# Patient Record
Sex: Male | Born: 1950
Health system: Southern US, Community
[De-identification: ages and names within clinical notes are randomized; demographics above are authoritative.]

## PROBLEM LIST (undated history)

## (undated) ENCOUNTER — Emergency Department (HOSPITAL_COMMUNITY): Payer: Medicare Other | Source: Home / Self Care

## (undated) DIAGNOSIS — F3289 Other specified depressive episodes: Secondary | ICD-10-CM

## (undated) DIAGNOSIS — Z833 Family history of diabetes mellitus: Secondary | ICD-10-CM

## (undated) DIAGNOSIS — S139XXA Sprain of joints and ligaments of unspecified parts of neck, initial encounter: Secondary | ICD-10-CM

## (undated) DIAGNOSIS — H6990 Unspecified Eustachian tube disorder, unspecified ear: Secondary | ICD-10-CM

## (undated) DIAGNOSIS — Z860100 Personal history of colon polyps, unspecified: Secondary | ICD-10-CM

## (undated) DIAGNOSIS — I129 Hypertensive chronic kidney disease with stage 1 through stage 4 chronic kidney disease, or unspecified chronic kidney disease: Secondary | ICD-10-CM

## (undated) DIAGNOSIS — F17200 Nicotine dependence, unspecified, uncomplicated: Secondary | ICD-10-CM

## (undated) DIAGNOSIS — E079 Disorder of thyroid, unspecified: Secondary | ICD-10-CM

## (undated) DIAGNOSIS — R569 Unspecified convulsions: Secondary | ICD-10-CM

## (undated) DIAGNOSIS — Z0000 Encounter for general adult medical examination without abnormal findings: Secondary | ICD-10-CM

## (undated) DIAGNOSIS — H6980 Other specified disorders of Eustachian tube, unspecified ear: Secondary | ICD-10-CM

## (undated) DIAGNOSIS — Z6372 Alcoholism and drug addiction in family: Secondary | ICD-10-CM

## (undated) DIAGNOSIS — F329 Major depressive disorder, single episode, unspecified: Secondary | ICD-10-CM

## (undated) DIAGNOSIS — E786 Lipoprotein deficiency: Secondary | ICD-10-CM

## (undated) DIAGNOSIS — N529 Male erectile dysfunction, unspecified: Secondary | ICD-10-CM

## (undated) DIAGNOSIS — K219 Gastro-esophageal reflux disease without esophagitis: Secondary | ICD-10-CM

## (undated) DIAGNOSIS — M1990 Unspecified osteoarthritis, unspecified site: Secondary | ICD-10-CM

## (undated) DIAGNOSIS — K5730 Diverticulosis of large intestine without perforation or abscess without bleeding: Secondary | ICD-10-CM

## (undated) DIAGNOSIS — I739 Peripheral vascular disease, unspecified: Secondary | ICD-10-CM

## (undated) DIAGNOSIS — Z202 Contact with and (suspected) exposure to infections with a predominantly sexual mode of transmission: Secondary | ICD-10-CM

## (undated) DIAGNOSIS — Z923 Personal history of irradiation: Secondary | ICD-10-CM

## (undated) DIAGNOSIS — Z818 Family history of other mental and behavioral disorders: Secondary | ICD-10-CM

## (undated) DIAGNOSIS — J984 Other disorders of lung: Secondary | ICD-10-CM

## (undated) DIAGNOSIS — F419 Anxiety disorder, unspecified: Secondary | ICD-10-CM

## (undated) HISTORY — PX: SPERMATOCELECTOMY: SHX2420

## (undated) HISTORY — DX: Male erectile dysfunction, unspecified: N52.9

## (undated) HISTORY — DX: Diverticulosis of large intestine without perforation or abscess without bleeding: K57.30

## (undated) HISTORY — DX: Anxiety disorder, unspecified: F41.9

## (undated) HISTORY — DX: Gastro-esophageal reflux disease without esophagitis: K21.9

## (undated) HISTORY — DX: Contact with and (suspected) exposure to infections with a predominantly sexual mode of transmission: Z20.2

## (undated) HISTORY — DX: Major depressive disorder, single episode, unspecified: F32.9

## (undated) HISTORY — DX: Unspecified osteoarthritis, unspecified site: M19.90

## (undated) HISTORY — DX: Peripheral vascular disease, unspecified: I73.9

## (undated) HISTORY — DX: Unspecified eustachian tube disorder, unspecified ear: H69.90

## (undated) HISTORY — DX: Disorder of thyroid, unspecified: E07.9

## (undated) HISTORY — PX: OTHER SURGICAL HISTORY: SHX169

## (undated) HISTORY — DX: Sprain of joints and ligaments of unspecified parts of neck, initial encounter: S13.9XXA

## (undated) HISTORY — DX: Personal history of colonic polyps: Z86.010

## (undated) HISTORY — DX: Alcoholism and drug addiction in family: Z63.72

## (undated) HISTORY — DX: Family history of diabetes mellitus: Z83.3

## (undated) HISTORY — DX: Nicotine dependence, unspecified, uncomplicated: F17.200

## (undated) HISTORY — DX: Family history of other mental and behavioral disorders: Z81.8

## (undated) HISTORY — DX: Other disorders of lung: J98.4

## (undated) HISTORY — PX: POPLITEAL ARTERY ANGIOPLASTY: SHX2242

## (undated) HISTORY — PX: VASECTOMY: SHX75

## (undated) HISTORY — DX: Unspecified convulsions: R56.9

## (undated) HISTORY — DX: Hypertensive chronic kidney disease with stage 1 through stage 4 chronic kidney disease, or unspecified chronic kidney disease: I12.9

## (undated) HISTORY — PX: CATARACT EXTRACTION: SUR2

## (undated) HISTORY — PX: KNEE ARTHROSCOPY: SHX127

## (undated) HISTORY — DX: Lipoprotein deficiency: E78.6

## (undated) HISTORY — DX: Encounter for general adult medical examination without abnormal findings: Z00.00

## (undated) HISTORY — DX: Other specified depressive episodes: F32.89

## (undated) HISTORY — DX: Personal history of colon polyps, unspecified: Z86.0100

## (undated) HISTORY — DX: Other specified disorders of Eustachian tube, unspecified ear: H69.80

---

## 1998-12-19 ENCOUNTER — Encounter: Payer: Self-pay | Admitting: Emergency Medicine

## 1998-12-19 ENCOUNTER — Inpatient Hospital Stay (HOSPITAL_COMMUNITY): Admission: EM | Admit: 1998-12-19 | Discharge: 1998-12-20 | Payer: Self-pay | Admitting: Emergency Medicine

## 1998-12-20 ENCOUNTER — Encounter: Payer: Self-pay | Admitting: Emergency Medicine

## 1999-04-04 ENCOUNTER — Encounter: Payer: Self-pay | Admitting: Internal Medicine

## 1999-04-04 ENCOUNTER — Ambulatory Visit (HOSPITAL_COMMUNITY): Admission: RE | Admit: 1999-04-04 | Discharge: 1999-04-04 | Payer: Self-pay | Admitting: Internal Medicine

## 1999-10-19 ENCOUNTER — Encounter: Payer: Self-pay | Admitting: Internal Medicine

## 1999-10-19 ENCOUNTER — Ambulatory Visit (HOSPITAL_COMMUNITY): Admission: RE | Admit: 1999-10-19 | Discharge: 1999-10-19 | Payer: Self-pay | Admitting: Internal Medicine

## 1999-11-05 ENCOUNTER — Emergency Department (HOSPITAL_COMMUNITY): Admission: EM | Admit: 1999-11-05 | Discharge: 1999-11-05 | Payer: Self-pay | Admitting: Emergency Medicine

## 2000-03-29 ENCOUNTER — Encounter: Payer: Self-pay | Admitting: Neurology

## 2000-03-29 ENCOUNTER — Ambulatory Visit (HOSPITAL_COMMUNITY): Admission: RE | Admit: 2000-03-29 | Discharge: 2000-03-29 | Payer: Self-pay | Admitting: Neurology

## 2000-04-08 ENCOUNTER — Ambulatory Visit (HOSPITAL_COMMUNITY): Admission: RE | Admit: 2000-04-08 | Discharge: 2000-04-08 | Payer: Self-pay | Admitting: Neurology

## 2000-04-08 ENCOUNTER — Encounter: Payer: Self-pay | Admitting: Neurology

## 2000-08-21 ENCOUNTER — Encounter: Admission: RE | Admit: 2000-08-21 | Discharge: 2000-10-15 | Payer: Self-pay | Admitting: Anesthesiology

## 2000-09-22 ENCOUNTER — Ambulatory Visit (HOSPITAL_COMMUNITY): Admission: RE | Admit: 2000-09-22 | Discharge: 2000-09-22 | Payer: Self-pay | Admitting: Neurology

## 2000-09-22 ENCOUNTER — Encounter: Payer: Self-pay | Admitting: Neurology

## 2000-10-09 ENCOUNTER — Ambulatory Visit (HOSPITAL_COMMUNITY): Admission: RE | Admit: 2000-10-09 | Discharge: 2000-10-09 | Payer: Self-pay | Admitting: Internal Medicine

## 2000-10-09 ENCOUNTER — Encounter: Payer: Self-pay | Admitting: Internal Medicine

## 2001-03-09 ENCOUNTER — Encounter: Admission: RE | Admit: 2001-03-09 | Discharge: 2001-03-09 | Payer: Self-pay | Admitting: Internal Medicine

## 2001-03-09 ENCOUNTER — Encounter: Payer: Self-pay | Admitting: Internal Medicine

## 2002-03-03 ENCOUNTER — Encounter: Payer: Self-pay | Admitting: Internal Medicine

## 2002-03-03 ENCOUNTER — Ambulatory Visit (HOSPITAL_COMMUNITY): Admission: RE | Admit: 2002-03-03 | Discharge: 2002-03-03 | Payer: Self-pay | Admitting: Internal Medicine

## 2002-11-16 ENCOUNTER — Ambulatory Visit (HOSPITAL_BASED_OUTPATIENT_CLINIC_OR_DEPARTMENT_OTHER): Admission: RE | Admit: 2002-11-16 | Discharge: 2002-11-16 | Payer: Self-pay | Admitting: Urology

## 2002-11-16 ENCOUNTER — Encounter (INDEPENDENT_AMBULATORY_CARE_PROVIDER_SITE_OTHER): Payer: Self-pay

## 2003-11-08 ENCOUNTER — Encounter: Admission: RE | Admit: 2003-11-08 | Discharge: 2003-11-08 | Payer: Self-pay | Admitting: Internal Medicine

## 2004-07-24 ENCOUNTER — Ambulatory Visit: Payer: Self-pay | Admitting: Internal Medicine

## 2004-07-31 ENCOUNTER — Ambulatory Visit: Payer: Self-pay | Admitting: Internal Medicine

## 2004-08-06 ENCOUNTER — Ambulatory Visit: Payer: Self-pay | Admitting: Internal Medicine

## 2004-08-21 ENCOUNTER — Ambulatory Visit: Payer: Self-pay | Admitting: Internal Medicine

## 2004-12-21 ENCOUNTER — Ambulatory Visit: Payer: Self-pay | Admitting: Internal Medicine

## 2005-03-07 ENCOUNTER — Ambulatory Visit: Payer: Self-pay | Admitting: Internal Medicine

## 2005-03-12 ENCOUNTER — Ambulatory Visit: Payer: Self-pay | Admitting: Internal Medicine

## 2005-03-20 ENCOUNTER — Ambulatory Visit: Payer: Self-pay

## 2005-03-20 ENCOUNTER — Ambulatory Visit: Payer: Self-pay | Admitting: Cardiology

## 2005-03-20 ENCOUNTER — Encounter: Payer: Self-pay | Admitting: Cardiovascular Disease

## 2005-04-08 ENCOUNTER — Ambulatory Visit: Payer: Self-pay | Admitting: Cardiology

## 2005-09-16 ENCOUNTER — Ambulatory Visit: Payer: Self-pay | Admitting: Cardiovascular Disease

## 2006-02-04 ENCOUNTER — Inpatient Hospital Stay (HOSPITAL_COMMUNITY): Admission: RE | Admit: 2006-02-04 | Discharge: 2006-02-07 | Payer: Self-pay | Admitting: *Deleted

## 2006-02-04 ENCOUNTER — Ambulatory Visit: Payer: Self-pay | Admitting: *Deleted

## 2006-06-11 ENCOUNTER — Ambulatory Visit: Payer: Self-pay | Admitting: Cardiovascular Disease

## 2006-06-11 ENCOUNTER — Ambulatory Visit: Payer: Self-pay

## 2006-06-25 ENCOUNTER — Ambulatory Visit: Payer: Self-pay | Admitting: Cardiology

## 2006-06-25 ENCOUNTER — Ambulatory Visit (HOSPITAL_COMMUNITY): Admission: RE | Admit: 2006-06-25 | Discharge: 2006-06-25 | Payer: Self-pay | Admitting: Cardiology

## 2006-07-02 ENCOUNTER — Ambulatory Visit: Payer: Self-pay | Admitting: Internal Medicine

## 2006-07-02 ENCOUNTER — Ambulatory Visit: Payer: Self-pay | Admitting: Vascular Surgery

## 2006-07-02 ENCOUNTER — Inpatient Hospital Stay (HOSPITAL_COMMUNITY): Admission: EM | Admit: 2006-07-02 | Discharge: 2006-07-04 | Payer: Self-pay | Admitting: Emergency Medicine

## 2006-07-02 ENCOUNTER — Encounter: Payer: Self-pay | Admitting: Internal Medicine

## 2006-07-02 ENCOUNTER — Encounter: Payer: Self-pay | Admitting: Vascular Surgery

## 2006-07-09 ENCOUNTER — Ambulatory Visit (HOSPITAL_COMMUNITY): Admission: RE | Admit: 2006-07-09 | Discharge: 2006-07-09 | Payer: Self-pay | Admitting: Cardiology

## 2006-07-21 ENCOUNTER — Ambulatory Visit: Payer: Self-pay

## 2006-07-24 ENCOUNTER — Ambulatory Visit: Payer: Self-pay | Admitting: Cardiology

## 2006-07-28 DIAGNOSIS — Z860100 Personal history of colon polyps, unspecified: Secondary | ICD-10-CM | POA: Insufficient documentation

## 2006-07-28 DIAGNOSIS — F324 Major depressive disorder, single episode, in partial remission: Secondary | ICD-10-CM

## 2006-07-28 DIAGNOSIS — E786 Lipoprotein deficiency: Secondary | ICD-10-CM | POA: Insufficient documentation

## 2006-07-28 DIAGNOSIS — J984 Other disorders of lung: Secondary | ICD-10-CM | POA: Insufficient documentation

## 2006-07-28 DIAGNOSIS — N529 Male erectile dysfunction, unspecified: Secondary | ICD-10-CM | POA: Insufficient documentation

## 2006-07-28 DIAGNOSIS — S139XXA Sprain of joints and ligaments of unspecified parts of neck, initial encounter: Secondary | ICD-10-CM | POA: Insufficient documentation

## 2006-07-28 DIAGNOSIS — K5730 Diverticulosis of large intestine without perforation or abscess without bleeding: Secondary | ICD-10-CM | POA: Insufficient documentation

## 2006-07-29 ENCOUNTER — Encounter: Payer: Self-pay | Admitting: Internal Medicine

## 2006-07-29 ENCOUNTER — Ambulatory Visit: Payer: Self-pay | Admitting: Internal Medicine

## 2006-07-29 LAB — CONVERTED CEMR LAB
Free T4: 0.7 ng/dL (ref 0.6–1.6)
Rhuematoid fact SerPl-aCnc: <20 intl units/mL — ABNORMAL LOW (ref 0.0–20.0)
Sed Rate: 28 mm/hr — ABNORMAL HIGH (ref 0–20)
TSH: 4.8 microintl units/mL (ref 0.35–5.50)
Uric Acid, Serum: 6.5 mg/dL (ref 2.4–7.0)

## 2006-08-25 ENCOUNTER — Telehealth (INDEPENDENT_AMBULATORY_CARE_PROVIDER_SITE_OTHER): Payer: Self-pay | Admitting: *Deleted

## 2006-09-03 ENCOUNTER — Ambulatory Visit: Payer: Self-pay | Admitting: Psychiatry

## 2006-09-03 ENCOUNTER — Inpatient Hospital Stay (HOSPITAL_COMMUNITY): Admission: RE | Admit: 2006-09-03 | Discharge: 2006-09-10 | Payer: Self-pay | Admitting: Psychiatry

## 2006-09-03 ENCOUNTER — Emergency Department (HOSPITAL_COMMUNITY): Admission: EM | Admit: 2006-09-03 | Discharge: 2006-09-03 | Payer: Self-pay | Admitting: Emergency Medicine

## 2006-10-09 ENCOUNTER — Ambulatory Visit: Payer: Self-pay | Admitting: Internal Medicine

## 2006-10-09 DIAGNOSIS — H6980 Other specified disorders of Eustachian tube, unspecified ear: Secondary | ICD-10-CM

## 2006-10-22 ENCOUNTER — Ambulatory Visit: Payer: Self-pay | Admitting: Cardiovascular Disease

## 2006-10-22 ENCOUNTER — Ambulatory Visit: Payer: Self-pay | Admitting: Internal Medicine

## 2006-10-28 ENCOUNTER — Encounter (INDEPENDENT_AMBULATORY_CARE_PROVIDER_SITE_OTHER): Payer: Self-pay | Admitting: *Deleted

## 2007-07-22 ENCOUNTER — Ambulatory Visit: Payer: Self-pay

## 2007-09-14 ENCOUNTER — Ambulatory Visit: Payer: Self-pay | Admitting: Cardiovascular Disease

## 2007-10-22 ENCOUNTER — Encounter: Payer: Self-pay | Admitting: Cardiovascular Disease

## 2007-10-22 ENCOUNTER — Ambulatory Visit: Payer: Self-pay

## 2008-07-26 ENCOUNTER — Ambulatory Visit: Payer: Self-pay

## 2008-07-26 ENCOUNTER — Encounter: Payer: Self-pay | Admitting: Cardiovascular Disease

## 2008-09-27 DIAGNOSIS — I739 Peripheral vascular disease, unspecified: Secondary | ICD-10-CM

## 2008-09-27 DIAGNOSIS — K219 Gastro-esophageal reflux disease without esophagitis: Secondary | ICD-10-CM | POA: Insufficient documentation

## 2008-09-27 DIAGNOSIS — F17200 Nicotine dependence, unspecified, uncomplicated: Secondary | ICD-10-CM

## 2008-09-27 DIAGNOSIS — I129 Hypertensive chronic kidney disease with stage 1 through stage 4 chronic kidney disease, or unspecified chronic kidney disease: Secondary | ICD-10-CM

## 2008-10-03 ENCOUNTER — Ambulatory Visit: Payer: Self-pay | Admitting: Cardiovascular Disease

## 2008-10-04 ENCOUNTER — Encounter: Payer: Self-pay | Admitting: Cardiovascular Disease

## 2009-07-25 ENCOUNTER — Encounter: Payer: Self-pay | Admitting: Cardiovascular Disease

## 2009-07-26 ENCOUNTER — Ambulatory Visit: Payer: Self-pay

## 2009-07-26 ENCOUNTER — Encounter: Payer: Self-pay | Admitting: Cardiovascular Disease

## 2009-08-18 ENCOUNTER — Encounter (INDEPENDENT_AMBULATORY_CARE_PROVIDER_SITE_OTHER): Payer: Self-pay | Admitting: *Deleted

## 2009-12-29 ENCOUNTER — Telehealth (INDEPENDENT_AMBULATORY_CARE_PROVIDER_SITE_OTHER): Payer: Self-pay | Admitting: Physician Assistant

## 2009-12-29 ENCOUNTER — Ambulatory Visit: Payer: Self-pay | Admitting: Cardiology

## 2009-12-30 ENCOUNTER — Inpatient Hospital Stay (HOSPITAL_COMMUNITY): Admission: EM | Admit: 2009-12-30 | Discharge: 2010-01-04 | Payer: Self-pay | Admitting: Emergency Medicine

## 2010-01-01 ENCOUNTER — Encounter: Payer: Self-pay | Admitting: Cardiology

## 2010-01-01 ENCOUNTER — Ambulatory Visit: Payer: Self-pay | Admitting: Vascular Surgery

## 2010-01-01 ENCOUNTER — Encounter: Payer: Self-pay | Admitting: Cardiovascular Disease

## 2010-01-10 ENCOUNTER — Telehealth: Payer: Self-pay | Admitting: Cardiovascular Disease

## 2010-01-15 ENCOUNTER — Telehealth: Payer: Self-pay | Admitting: Cardiovascular Disease

## 2010-01-19 ENCOUNTER — Encounter: Payer: Self-pay | Admitting: Cardiovascular Disease

## 2010-01-22 ENCOUNTER — Ambulatory Visit: Payer: Self-pay

## 2010-01-22 ENCOUNTER — Encounter: Payer: Self-pay | Admitting: Cardiovascular Disease

## 2010-01-30 ENCOUNTER — Ambulatory Visit: Payer: Self-pay | Admitting: Cardiovascular Disease

## 2010-02-15 ENCOUNTER — Telehealth: Payer: Self-pay | Admitting: Cardiovascular Disease

## 2010-02-16 ENCOUNTER — Encounter: Payer: Self-pay | Admitting: Cardiovascular Disease

## 2010-04-19 NOTE — Letter (Signed)
Summary: Colonoscopy Letter  [facility]  [address] [place], [place] [address]   Phone: [phone]  Fax: [phone]      August 18, 2009 MRN: [id number]   David David [address] [place], [place]  [address]   Dear [name],   According to your medical record, it is time for you to schedule a Colonoscopy. The American Cancer Society recommends this procedure as a method to detect early colon cancer. Patients with a family history of colon cancer, or a personal history of colon polyps or inflammatory bowel disease are at increased risk.  This letter has been generated based on the recommendations made at the time of your procedure. If you feel that in your particular situation this may no longer apply, please contact our office.  Please call our office at [phone] to schedule this appointment or to update your records at your earliest convenience.  Thank you for cooperating with [place] to provide you with the very best care possible.   Sincerely,  Wilhemina Bonito. Marina Goodell, M.D.  [facility] Division [phone]

## 2010-04-19 NOTE — Assessment & Plan Note (Signed)
Summary: eph   Visit Type:  eph Primary Provider:  Marga Melnick MD  CC:  no complaints.  History of Present Illness: 60 year-old male with lower extremity PAD, presenting for follow-up. He has a history of atheroemboli to his left foot and underwent PTA and stenting of the tibioperoneal trunk with a drug-eluting stent in 2008. He developed recurrent ischemic changes in the left foot last month and underwent angiography with PTA of severe restenosis in the distal TP trunk.  He is doing well at present with only mild residual numbness in the left foot. His skin changes have resolved and he denies foot pain. No other complaints.  Current Medications (verified): 1)  Mirtazapine 15 Mg Tabs (Mirtazapine) .... At Bedtime 2)  Pravastatin Sodium 40 Mg Tabs (Pravastatin Sodium) .... Take One Tablet By Mouth Daily At Bedtime 3)  Flax Seed Oil 1000 Mg Caps (Flaxseed (Linseed)) .... Take 1 Capsule By Mouth Three Times A Day 4)  Hydroxyzine Hcl 50 Mg Tabs (Hydroxyzine Hcl) .... Four Times A Day 5)  Gabapentin 600 Mg Tabs (Gabapentin) .... Take 1 Tablet By Mouth Four Times A Day 6)  Vitamin B-1 250 Mg  Tabs (Thiamine Hcl) .... Take 1 Tablet By Mouth Once A Day 7)  Vitamin E 600 Unit  Caps (Vitamin E) .... Take 1 Capsule By Mouth Once A Day 8)  Multivitamins   Tabs (Multiple Vitamin) .... Take 1 Tablet By Mouth Once A Day 9)  Folic Acid 1 Mg Tabs (Folic Acid) .... Take 1 Tablet By Mouth Once A Day 10)  Methotrexate 2.5 Mg Tabs (Methotrexate Sodium) .... 5 Tabletes Once A Week On Friday 11)  Aspirin 81 Mg Tbec (Aspirin) .... Take One Tablet By Mouth Daily 12)  Citalopram Hydrobromide 10 Mg Tabs (Citalopram Hydrobromide) .... Take 1 Tablet By Mouth Once A Day 13)  Amlodipine Besylate 10 Mg Tabs (Amlodipine Besylate) .... Take 1/2  Tablet By Mouth Daily 14)  Omeprazole 20 Mg Tbec (Omeprazole) .... Take 1 Tablet By Mouth Two Times A Day 15)  Acetaminophen 500 Mg Tabs (Acetaminophen) .... As Needed 16)   Cyclobenzaprine Hcl 10 Mg Tabs (Cyclobenzaprine Hcl) .... As Needed 17)  Prazosin Hcl 2 Mg Caps (Prazosin Hcl) .... 2 Cap Fulls At Bed Time 18)  Plavix 75 Mg Tabs (Clopidogrel Bisulfate) .... Take One Tablet By Mouth Daily  Allergies (verified): 1)  Paxil 2)  Niacin 3)  Neosporin  Past History:  Past medical history reviewed for relevance to current acute and chronic problems.  Past Medical History: Reviewed history from 09/27/2008 and no changes required. 4/15-4/17/2008transient OD blindness & presyncope Current Problems:  PERIPHERAL VASCULAR DISEASE (ICD-443.9)- Lower extremiy peripheral artery disease. CHOLESTEROL EMBOLIZATION SYNDROME (ICD-403.90)- to the left foot GERD (ICD-530.81) SEXUALLY TRANSMITTED DISEASE, EXPOSURE TO (ICD-V01.6) DYSFUNCTION, EUSTACHIAN TUBE (ICD-381.81) EXAMINATION, ROUTINE MEDICAL (ICD-V70.0) Family Hx of SUICIDE, FAMILY HX (ICD-V17.0) FAMILY HISTORY DIABETES 1ST DEGREE RELATIVE (ICD-V18.0) FAMILY HISTORY OF ALCOHOLISM/ADDICTION (ICD-V61.41) LOW HDL (ICD-272.5) ERECTILE DYSFUNCTION, ORGANIC (ICD-607.84) PULMONARY NODULE (ICD-518.89) Hx of SPRAIN/STRAIN, NECK (ICD-847.0) DIVERTICULOSIS, COLON (ICD-562.10) DEPRESSION (ICD-311) COLONIC POLYPS, HX OF (ICD-V12.72) TOBACCO ABUSE (ICD-305.1)  Vital Signs:  Patient profile:   60 year old male Height:      76 inches Weight:      207.50 pounds BMI:     25.35 Pulse rate:   67 / minute Resp:     12 per minute BP sitting:   128 / 70  (left arm) Cuff size:   large  Vitals Entered By:  Caralee Ates CMA (January 30, 2010 10:39 AM)  Physical Exam  General:  Pt is alert and oriented, in no acute distress. HEENT: normal Neck: normal carotid upstrokes without bruits, JVP normal Lungs: CTA CV: RRR without murmur or gallop Abd: soft, NT, positive BS, no bruit, no organomegaly Ext: no clubbing, cyanosis, or edema. peripheral pulses 2+ and equal Skin: warm and dry without rash    EKG  Procedure date:   01/30/2010  Findings:      NSR 67 bpm, nonspecific T wave abnormality  ABI's  Procedure date:  01/22/2010  Findings:      Right leg ABI 1.1, TBI 0.75 Left leg ABI 0.97, TBI 0.78  Impression & Recommendations:  Problem # 1:  PERIPHERAL VASCULAR DISEASE (ICD-443.9) Pt stable following recent lower extremity PTA procedure. ABI and TBI were normal as above. Discussed need to remain on dual antiplatelet Rx with ASA and plavix. Also discussed tobacco cessation.  Recommend followup in 6 months.  Patient Instructions: 1)  Your physician recommends that you continue on your current medications as directed. Please refer to the Current Medication list given to you today. 2)  Your physician wants you to follow-up in:  6 MONTHS.  You will receive a reminder letter in the mail two months in advance. If you don't receive a letter, please call our office to schedule the follow-up appointment.

## 2010-04-19 NOTE — Medication Information (Signed)
Summary: [facility] Plavix Request Form   [facility] Plavix Request Form   Imported By: Roderic Ovens 03/08/2010 12:03:27  _____________________________________________________________________  External Attachment:    Type:   Image     Comment:   External Document

## 2010-04-19 NOTE — Miscellaneous (Signed)
Summary: Orders Update  Clinical Lists Changes  Orders: Added new Test order of Arterial Duplex Lower Extremity (Arterial Duplex Low) - Signed 

## 2010-04-19 NOTE — Progress Notes (Signed)
Summary: samples of plavix  Phone Note Call from Patient   Caller: Patient [phone] Reason for Call: Talk to Nurse Summary of Call: pt calling for samples of plavix Initial call taken by: Glynda Jaeger,  January 15, 2010 4:55 PM  Follow-up for Phone Call        Mr. Hollett is aware samples are at desk. He would also like Dr. [name] or Lauren to send information to [facility] regarding need for plavix.  He said it would be great if you sent a copy of his hospital dc summary.  [facility] [place]   [address] [place], [place] [address] Tele# [phone] Attn:  ?sp  Dr. Odessa Fleming  I will forward this information to both Dr. [name] & Leotis Shames. Mylo Red RN      Appended Document: samples of plavix Copy of discharge summary mailed to [facility].

## 2010-04-19 NOTE — Progress Notes (Signed)
Summary: pt cath site is hard  Phone Note Call from Patient Call back at Home Phone [phone]   Caller: Patient Reason for Call: Talk to Nurse, Talk to Doctor Summary of Call: pt cath site is very hard and he thinks something is wrong and wants to talk to someone Initial call taken by: Omer Jack,  January 10, 2010 11:47 AM  Follow-up for Phone Call        LVM x1 Whitney Maeola Sarah RN  January 10, 2010 12:02 PM  Follow-up by: Whitney Maeola Sarah RN,  January 10, 2010 12:01 PM  Additional Follow-up for Phone Call Additional follow up Details #1::        The pt walked into the office concerned about his groin area.  I looked at his right groin and he has a dime size knot at puncture site. No drainage or bleeding from site. The pt does have some bruising but this has already started changing to dark purple and yellow.  Site is tender to touch.  No bruit heard.  Color of toes has improved.  I offered a groin ultrasound to the pt but at this point he would like to hold off.  I instructed him to call the office if he had any other questions or concerns. Pt agreed with plan.  Additional Follow-up by: Julieta Gutting, RN, BSN,  January 10, 2010 12:19 PM

## 2010-04-19 NOTE — Procedures (Signed)
Summary: Colonoscopy/[facility]  Colonoscopy/[facility]   Imported By: Sherian Rein 08/21/2009 10:18:45  _____________________________________________________________________  External Attachment:    Type:   Image     Comment:   External Document

## 2010-04-19 NOTE — Progress Notes (Signed)
  Phone Note Call from Patient   Call For: Dr Tonny Bollman Reason for Call: Acute Illness Details for Reason: Blue Toe Summary of Call: Pt w/ hx PVD. Today, great toe is blue and discoloration beginning on bottom of foot. He is not having pain but has neuropathy. Advised him to come immediately to ER. Pt dtr aware and said she would bring him. Initial call taken by: Park Breed PA-C,  December 29, 2009 7:50 PM

## 2010-04-19 NOTE — Progress Notes (Signed)
Summary: Pt needs samples on Plavix  Phone Note Call from Patient Call back at [phone]   Summary of Call: Samples of Plavix  Initial call taken by: Judie Grieve,  February 15, 2010 2:10 PM  Follow-up for Phone Call        Explained to pt that we currently do not have any Plavix samples. He is going to drop off a form that needs to be faxed to the [place]. I will leave this for Dr. [name] to sign tomorrow @ the office & we will fax to the [place]. Whitney Maeola Sarah RN  February 15, 2010 2:17 PM  Follow-up by: Whitney Maeola Sarah RN,  February 15, 2010 2:17 PM

## 2010-05-04 ENCOUNTER — Telehealth: Payer: Self-pay | Admitting: Cardiovascular Disease

## 2010-05-06 ENCOUNTER — Encounter: Payer: Self-pay | Admitting: Cardiovascular Disease

## 2010-05-09 NOTE — Progress Notes (Addendum)
Summary: letter stating it's o.k. mri- knee  Phone Note Call from Patient Call back at Home Phone [phone]   Caller: Patient- can leave message [phone] Reason for Call: Talk to Nurse Summary of Call: need a letter stating it's o.k. for pt to have an mri on his knee. [facility]  Initial call taken by: Lorne Skeens,  May 04, 2010 12:46 PM  Follow-up for Phone Call        LM for pt --I will forward to Dr [name] for review     Appended Document: letter stating it's o.k. mri- knee Left message on pt's voicemail.  Letter mailed to pt's home.

## 2010-05-15 NOTE — Letter (Signed)
Summary: MRI  [facility] technologist, Main Office  [address]   [place], [place] [address]   Phone: [phone]  Fax: [phone]        May 06, 2010 MRN: [id number]    David David [address] [place], [place]  [address]   To Whom it May Concern,  The above patient has a stent in his left tibioperoneal artery that has been in place several years. This is not a contraindication to MRI.  Sincerely,  Norva Karvonen, MD  This letter has been electronically signed by your physician.

## 2010-05-30 LAB — COMPREHENSIVE METABOLIC PANEL
Alkaline Phosphatase: 108 U/L (ref 39–117)
GFR calc non Af Amer: >60 mL/min (ref >60)
Potassium: 4.1 mEq/L (ref 3.5–5.1)
Sodium: 138 mEq/L (ref 135–145)
Total Bilirubin: 0.6 mg/dL (ref 0.3–1.2)

## 2010-05-30 LAB — BASIC METABOLIC PANEL
BUN: 11 mg/dL (ref 6–23)
Calcium: 8.8 mg/dL (ref 8.4–10.5)
Potassium: 4.3 mEq/L (ref 3.5–5.1)
Sodium: 142 mEq/L (ref 135–145)

## 2010-05-30 LAB — LIPID PANEL
HDL: 29 mg/dL — ABNORMAL LOW (ref >39)
Total CHOL/HDL Ratio: 6 RATIO
VLDL: 59 mg/dL — ABNORMAL HIGH (ref 0–40)

## 2010-05-30 LAB — T4, FREE: Free T4: 0.87 ng/dL (ref 0.80–1.80)

## 2010-05-30 LAB — D-DIMER, QUANTITATIVE: D-Dimer, Quant: 0.67 ug/mL-FEU — ABNORMAL HIGH (ref 0.00–0.48)

## 2010-05-30 LAB — DIFFERENTIAL
Basophils Absolute: 0 10*3/uL (ref 0.0–0.1)
Lymphs Abs: 2.9 10*3/uL (ref 0.7–4.0)
Monocytes Absolute: 0.8 10*3/uL (ref 0.1–1.0)
Monocytes Relative: 7 % (ref 3–12)

## 2010-05-30 LAB — CBC
HCT: 42.6 % (ref 39.0–52.0)
Hemoglobin: 13.5 g/dL (ref 13.0–17.0)
MCHC: 33.1 g/dL (ref 30.0–36.0)
MCHC: 33.3 g/dL (ref 30.0–36.0)
MCV: 92 fL (ref 78.0–100.0)
MCV: 92.5 fL (ref 78.0–100.0)
Platelets: 138 10*3/uL — ABNORMAL LOW (ref 150–400)
RBC: 4.63 MIL/uL (ref 4.22–5.81)
RDW: 14.7 % (ref 11.5–15.5)
RDW: 15.2 % (ref 11.5–15.5)
WBC: 10.8 10*3/uL — ABNORMAL HIGH (ref 4.0–10.5)
WBC: 5.3 10*3/uL (ref 4.0–10.5)
WBC: 5.9 10*3/uL (ref 4.0–10.5)

## 2010-05-30 LAB — PROTIME-INR: Prothrombin Time: 12.6 seconds (ref 11.6–15.2)

## 2010-05-30 LAB — HEPARIN LEVEL (UNFRACTIONATED): Heparin Unfractionated: 0.12 IU/mL — ABNORMAL LOW (ref 0.30–0.70)

## 2010-06-13 ENCOUNTER — Emergency Department (HOSPITAL_COMMUNITY)
Admission: EM | Admit: 2010-06-13 | Discharge: 2010-06-14 | Disposition: A | Discharge status: Home / Self Care | Payer: Medicare Other | Attending: Emergency Medicine | Admitting: Emergency Medicine

## 2010-06-13 DIAGNOSIS — M069 Rheumatoid arthritis, unspecified: Secondary | ICD-10-CM | POA: Insufficient documentation

## 2010-06-13 DIAGNOSIS — F3289 Other specified depressive episodes: Secondary | ICD-10-CM | POA: Insufficient documentation

## 2010-06-13 DIAGNOSIS — Z9889 Other specified postprocedural states: Secondary | ICD-10-CM | POA: Insufficient documentation

## 2010-06-13 DIAGNOSIS — F329 Major depressive disorder, single episode, unspecified: Secondary | ICD-10-CM | POA: Insufficient documentation

## 2010-06-13 DIAGNOSIS — B372 Candidiasis of skin and nail: Secondary | ICD-10-CM | POA: Insufficient documentation

## 2010-06-13 DIAGNOSIS — R21 Rash and other nonspecific skin eruption: Secondary | ICD-10-CM | POA: Insufficient documentation

## 2010-06-13 DIAGNOSIS — Z79899 Other long term (current) drug therapy: Secondary | ICD-10-CM | POA: Insufficient documentation

## 2010-06-13 DIAGNOSIS — I739 Peripheral vascular disease, unspecified: Secondary | ICD-10-CM | POA: Insufficient documentation

## 2010-06-25 ENCOUNTER — Emergency Department (HOSPITAL_COMMUNITY)
Admission: EM | Admit: 2010-06-25 | Discharge: 2010-06-26 | Disposition: A | Discharge status: Home / Self Care | Payer: Medicare Other | Attending: Emergency Medicine | Admitting: Emergency Medicine

## 2010-06-25 DIAGNOSIS — R42 Dizziness and giddiness: Secondary | ICD-10-CM | POA: Insufficient documentation

## 2010-06-25 DIAGNOSIS — R61 Generalized hyperhidrosis: Secondary | ICD-10-CM | POA: Insufficient documentation

## 2010-06-25 DIAGNOSIS — R55 Syncope and collapse: Secondary | ICD-10-CM | POA: Insufficient documentation

## 2010-06-25 DIAGNOSIS — M069 Rheumatoid arthritis, unspecified: Secondary | ICD-10-CM | POA: Insufficient documentation

## 2010-06-25 DIAGNOSIS — R5381 Other malaise: Secondary | ICD-10-CM | POA: Insufficient documentation

## 2010-06-25 DIAGNOSIS — F329 Major depressive disorder, single episode, unspecified: Secondary | ICD-10-CM | POA: Insufficient documentation

## 2010-06-25 DIAGNOSIS — R110 Nausea: Secondary | ICD-10-CM | POA: Insufficient documentation

## 2010-06-25 DIAGNOSIS — F3289 Other specified depressive episodes: Secondary | ICD-10-CM | POA: Insufficient documentation

## 2010-06-25 DIAGNOSIS — R5383 Other fatigue: Secondary | ICD-10-CM | POA: Insufficient documentation

## 2010-06-25 DIAGNOSIS — Z9889 Other specified postprocedural states: Secondary | ICD-10-CM | POA: Insufficient documentation

## 2010-06-25 DIAGNOSIS — Z79899 Other long term (current) drug therapy: Secondary | ICD-10-CM | POA: Insufficient documentation

## 2010-06-25 DIAGNOSIS — I739 Peripheral vascular disease, unspecified: Secondary | ICD-10-CM | POA: Insufficient documentation

## 2010-06-25 LAB — POCT CARDIAC MARKERS
CKMB, poc: 3.7 ng/mL (ref 1.0–8.0)
Myoglobin, poc: 141 ng/mL (ref 12–200)

## 2010-06-25 LAB — DIFFERENTIAL
Basophils Absolute: 0 10*3/uL (ref 0.0–0.1)
Basophils Relative: 0 % (ref 0–1)
Eosinophils Absolute: 0 10*3/uL (ref 0.0–0.7)
Eosinophils Relative: 0 % (ref 0–5)
Lymphocytes Relative: 13 % (ref 12–46)
Monocytes Absolute: 0.5 10*3/uL (ref 0.1–1.0)

## 2010-06-25 LAB — CBC
MCHC: 33.8 g/dL (ref 30.0–36.0)
Platelets: 179 10*3/uL (ref 150–400)
RDW: 15.3 % (ref 11.5–15.5)
WBC: 13 10*3/uL — ABNORMAL HIGH (ref 4.0–10.5)

## 2010-06-26 LAB — COMPREHENSIVE METABOLIC PANEL
ALT: 34 U/L (ref 0–53)
BUN: 19 mg/dL (ref 6–23)
CO2: 23 mEq/L (ref 19–32)
Calcium: 9 mg/dL (ref 8.4–10.5)
GFR calc non Af Amer: >60 mL/min (ref >60)
Glucose, Bld: 120 mg/dL — ABNORMAL HIGH (ref 70–99)
Total Protein: 6.8 g/dL (ref 6.0–8.3)

## 2010-06-26 LAB — POCT CARDIAC MARKERS
Myoglobin, poc: 103 ng/mL (ref 12–200)
Troponin i, poc: <0.05 ng/mL (ref 0.00–0.09)

## 2010-07-31 NOTE — Progress Notes (Signed)
[facility]                        PERIPHERAL VASCULAR OFFICE NOTE   INIOLUWA, BARIS                      MRN:          [id number]  DATE:07/24/2006                            DOB:          July 08, 1950    HISTORY OF PRESENT ILLNESS:  [name] is a 60 year old gentleman who  had spontaneous cholesterol emboli to his left foot in spring 2007 and  again in the winter of 2008.  Due to his recurrence we undertook an  extensive evaluation.  There was no evidence for vasculitis, proximal  source of embolus, or hypercoagulability.  Angiogram demonstrated a 90%  stenosis of the distal popliteal, just above the origin of the anterior  tibial.  Due to recurrent spontaneous emboli from this, I placed a  Cypher drug-eluting stent across it.  Since then, he has done well.  He  does have some residual neuropathic pain in his left fifth toe.  However, this is fairly mild and clearly much improved from previous.  The small ulcer on the dorsal aspect of the fifth toe has healed.   HABITS:  Continues to smoke a few cigarettes per day.  Says he just  puffs on these.   CURRENT MEDICATIONS:  1. Effexor 225 mg daily.  2. Aspirin 81 mg daily.  3. Wellbutrin 450 mg daily.  4. Norvasc 10 mg daily.  5. Plavix 75 mg daily.  6. Valium 10 mg twice daily.   PHYSICAL EXAMINATION:  He is generally well-appearing, in no distress,  with heart rate 80, blood pressure 115/81 and weight of 195 pounds.  He  has no jugular venous distention, thyromegaly or lymphadenopathy.  Lungs  are clear to auscultation.  He has a regular rate and rhythm without  murmur, rub or gallop.  Respiratory effort is normal.  Point of maximal  cardiac impulse is not displaced.  The abdomen is soft, nondistended,  nontender.  There is no hepatosplenomegaly.  Bowel sounds are normal.  The extremities are warm without clubbing, cyanosis, edema or  ulceration.  DP and PT pulses are 2+ in the left leg.   His left fifth  toe has moderate cyanosis with complete healing of the ulcer that was  previously on its dorsum.  No other ulcerations or cyanosis.  There is  no edema in either leg.   IMPRESSION/RECOMMENDATION:  Peripheral arterial disease with spontaneous  cholesterol emboli on two occasions to the left foot:  Symptoms  improving.  Left fifth toe is cyanotic but much less so than previous.  Pain is generally doing well.  Will make no changes in his medications.  Have him follow up with Dr. [name] in approximately 3 months' time.     Salvadore Farber, MD  Electronically Signed    WED/MedQ  DD: 07/24/2006  DT: 07/24/2006  Job #: [id number]

## 2010-07-31 NOTE — Progress Notes (Signed)
[facility]                        PERIPHERAL VASCULAR OFFICE NOTE   CJ, [name]                      MRN:          [id number]  DATE:09/14/2007                            DOB:          [birth year]    David David was seen in follow up with [facility] office on  September 14, 2007.  He is a 60 year old gentleman with lower extremity  peripheral arterial disease.  David David presented with embolic events  to the left foot back in 2007.  He had a recurrence later in the year.  He underwent an extensive evaluation for vasculitis as well as cardiac  source of emboli and hypercoagulability.  His initial workup was  unrevealing.  He ultimately underwent a lower extremity angiogram that  demonstrated the 90% stenosis of the distal left popliteal artery, and  he was treated with a drug-eluting coronary stent in the left popliteal  artery.  He has done well and had no further events.  He is able to walk  without symptoms of claudication.  His most recent ABIs were done on Jul 22, 2007, were 1.1 on the right and 1.0 on the left.  His stent is patent  by ultrasound.  There is an area of elevated velocity distal to the  stent in the popliteal artery.   His only complaint today is that of lower extremity edema.  Over the  past few weeks, he has noted swelling in the feet.  When he wakes up in  the morning, this is not noticeable but as the day progresses he has  significant edema involving both feet as well as his hands.  He has no  other complaints at this time.  He specifically denies chest pain or  dyspnea.   Medications include mirtazapine 15 mg alternating with 7.5 mg,  pravastatin 40 mg at bedtime, terazosin 2 mg at bedtime, flaxseed oil 1  g three times daily, hydroxyzine 50 mg three-four times daily,  gabapentin 500 mg four times daily, vitamin D1, vitamin E, multivitamin,  folic acid, methotrexate 2.5 mg three tabs weekly, Plavix 75 mg  daily,  aspirin 81 mg daily, amlodipine 10 mg daily, omeprazole 20 mg daily, and  escitalopram 10 mg daily.   ALLERGIES:  NKDA.   PHYSICAL EXAM:  GENERAL:  The patient is alert and oriented in no acute  distress.  VITAL SIGNS:  Heart rate 71, blood pressure 134/76, and respiratory rate  12.  HEENT:  Normal.  NECK:  Normal carotid upstrokes without bruits.  Jugular venous pressure  normal.  LUNGS:  Clear to auscultation bilaterally.  HEART:  Regular rate and rhythm without murmurs or gallops.  ABDOMEN:  Soft, nontender, no bruits.  No organomegaly.  EXTREMITIES:  There is trace pedal edema bilaterally.  There is no  pretibial edema.  Pedal pulses are 2+ and equal bilaterally.  SKIN:  Warm and dry.  There are multiple insect bites on the lower legs.   EKG shows normal sinus rhythm and is within normal limits.   ASSESSMENT:  1. Lower extremity peripheral arterial disease with a history of  atheroemboli to the left foot.  The patient is status post drug-      eluting stent left distal popliteal artery.  David David has      excellent ankle-brachial index and no symptoms at present.      Recommend continued observation with medical therapy as outlined      above.  He strongly desires to come off Plavix and he is now      greater than 1 year out from intervention.  I think his risk at      this point is relatively well and he can safely come off Plavix.  I      did tell him that if cause were not an issue I would favor long-      term therapy in the setting of peripheral arterial disease with a      drug-eluting stent in his below knee popliteal.  He will      discontinue Plavix and will maintain antiplatelet therapy with a      daily aspirin.  He has discontinued tobacco.   PLAN:  1. Follow up ABI and office visit 1 year.  2. Edema.  Records were reviewed and he had mildly abnormal LV      function on a previous echocardiogram from January 2007.  His LVEF      was estimated at  40%-50%.  Recheck echocardiogram to rule out      significant LV dysfunction.  He has no other signs of volume      overload or congestive heart failure.  I suspect mild venous      insufficiency in the setting of hot weather recently.   For follow up, I would like to see David David in 1 year.     Veverly Fells. [name], MD  Electronically Signed    MDC/MedQ  DD: 09/14/2007  DT: 09/15/2007  Job #: [id number]   cc:   Titus Dubin. Alwyn Ren, MD,FACP,FCCP

## 2010-07-31 NOTE — Discharge Summary (Signed)
NAME:  David David, David David NO.:  [id number]   MEDICAL RECORD NO.:  [id number]          PATIENT TYPE:  IPS   LOCATION:  0504                          FACILITY:  [facility]   PHYSICIAN:  Geoffery Lyons, M.D.      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  Endorsed history of  depression ever since he came back from [place] but said he never asked  for help until 5-6 years ago.  Started seeing Dr. Evelene Croon and Merlyn Albert May.  Endorsed long series of events, two brothers committed suicide, one  brother died after gallbladder surgery, reports other deaths.  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  He hardly saw his family  which contributed to the conflict between him and his wife, eventually  leading to separation.  Upon admission, endorsed feeling very  overwhelmed with thoughts of wanting to die.   PAST PSYCHIATRIC HISTORY:  Sees Dr. Evelene Croon.  Was on Effexor XR 225 mg per  day, was on 300 mg, Wellbutrin XL 450 mg per day.  He had tried to  decrease and come off of the Effexor but he experienced withdrawal.  He  had to go back on it.  He would like to try to come off.   ALCOHOL/DRUG HISTORY:  Denies active use of any substances.   MEDICAL HISTORY:  He has a shunt, left leg.   PHYSICAL EXAMINATION:  Performed and failed to show any acute findings.   LABORATORY DATA:  Sodium 145, potassium 3.6, glucose 100, BUN 11,  creatinine 1.15.   MEDICATIONS:  He was also on Norvasc 10 mg per day, Plavix 75 mg per  day, aspirin 81 mg per day and Valium 10 mg three times a day.   MENTAL STATUS EXAM:  Alert, cooperative male, some psychomotor  retardation, not as spontaneous.  Speech decreased rate and  volume.  Mood depressed.  Affect constricted.  Thought processes logical,  coherent and relevant.  Sense of being hopeless and helpless, suicidal  ruminations.  No active plan.  No homicidal ideas.  No delusions.  No  hallucinations.  Cognition well-preserved.   ADMISSION DIAGNOSES:  AXIS I:  Major depressive disorder.  Rule-out post-  traumatic stress disorder.  AXIS II:  No diagnosis.  AXIS III:  No diagnosis.  AXIS IV:  Moderate.  AXIS V:  GAF upon admission 35; highest GAF in the last year 70.   HOSPITAL COURSE:  He was admitted and started in individual and group  psychotherapy.  We started decreasing the Effexor by 37.5 mg and he was  started on Lexapro.  He endorsed that he thought about [place] all the  time, endorsed flashbacks and nightmares.  He continued to evidence the  depressed mood and affect, psychomotor retardation, thoughts of [place],  nightmares, flashbacks.  Went to the [place] groups when he came back and he  claimed there were a lot of people committing suicide.  Claimed he did  not want to go back.  He tried to take care of things himself.  As of  lately, says he had given up, hopeless, helpless, getting tired.  So we  continued to wean off from the Effexor and it went better than he  expected.  He experienced no acute withdrawal from the Effexor.  He was  tolerating the Lexapro well.  He endorsed he was starting to feel a  little better by September 08, 2006.  Concerned about when he gets out of the  hospital.  Willing to pursue the Lexapro and the Wellbutrin.  We worked  on Pharmacologist, cognitive behavioral therapy.  By September 09, 2006, he  endorsed he was actually feeling better.  Upset, still dealing with the  PTSD symptoms.  Endorsed change in the perception of his circumstances.  Endorsed that he was going to start to reach out to people, friends as  he could see how the isolation and the withdrawal fueled the depression.  We continued to work with the medications  and, on September 10, 2006, he was  in full contact with reality.  There were no active suicidal or  homicidal ideation.  No hallucinations.  No delusions.  Was willing and  motivated to pursue treatment.  Continued the antidepressant.  As he was  objectively better, we went ahead and discharged to outpatient follow-  up.   DISCHARGE DIAGNOSES:  AXIS I:  Major depression, recurrent.  Post-  traumatic stress disorder.  AXIS II:  No diagnosis.  AXIS III:  No diagnosis.  AXIS IV:  Moderate.  AXIS V:  GAF upon discharge 55-60.   DISCHARGE MEDICATIONS:  1. Lexapro 10 mg per day.  2. Wellbutrin XL 150 mg, 3 per day.  3. Plavix 75 mg per day.  4. Norvasc 10 mg per day.  5. Valium 10 mg three times a day.  6. Trazodone 50 mg at bedtime for sleep.   Asked not to take anymore Effexor.   FOLLOWUP:  To see Dr. Milagros Evener and Sherron Monday.      Geoffery Lyons, M.D.  Electronically Signed     IL/MEDQ  D:  10/10/2006  T:  10/11/2006  Job:  [id number]

## 2010-07-31 NOTE — H&P (Signed)
NAME:  David David, DEGANTE NO.:  [id number]   MEDICAL RECORD NO.:  [id number]          PATIENT TYPE:  EMS   LOCATION:  ED                           FACILITY:  [facility]   PHYSICIAN:  Salvadore Farber, MD  DATE OF BIRTH:  1951-03-01   DATE OF ADMISSION:  09/03/2006  DATE OF DISCHARGE:                              HISTORY & PHYSICAL   CHIEF COMPLAINT:  Rest pain and ulceration of the left foot.   HISTORY OF PRESENT ILLNESS:  Mr. Sedor is a 60 year old gentleman who  suffered what appeared to be spontaneous cholesterol emboli to his left  foot approximately 1 year ago.  I treated him with aspirin, smoking  cessation, and calcium channel blocker with resolution of his symptoms.  He then represented approximately 6 weeks ago with recurrent symptoms  including a blue left fifth toe.  He had gone back to smoking and  stopped the Norvasc.  He again quit smoking and resumed the Norvasc.  However, his symptoms have not resolved.  He was then hospitalized last  week, had an episode of monocular blindness.  An extensive evaluation  for a proximal source of embolus, intracranial atherosclerosis,  connective tissue disease, and hypercoagulable state was all negative.  He presents today for planned left leg angiography to attempt to clarify  a diagnosis of atherosclerosis versus Buerger disease with possible  percutaneous treatment.  If the leg is concerning for Buerger disease,  we will plan left arm angiography as well.   PAST MEDICAL HISTORY:  1. Depression.  2. Tobacco abuse.  3. GERD.  4. Status post knee arthroscopy.   ALLERGIES:  No known drug allergies.   CURRENT MEDICATIONS:  1. Protonix 40 mg daily.  2. Multivitamin.  3. Effexor 75 mg three times daily.  4. Aspirin 81 mg daily.  5. Valium 10 mg three times daily.  6. Wellbutrin 450 mg daily.  7. Norvasc 5 mg daily.  8. Flaxseed oil.   SOCIAL HISTORY:  The patient quit smoking about 3 weeks ago.  He denies  alcohol and illicit drug use.  There is a history of heavy alcohol use.   FAMILY HISTORY:  Father died of myocardial infarction at age 28.  Mother  had atrial fibrillation and died at 7 of old age.   REVIEW OF SYSTEMS:  Negative in detail except as above.  No further  episodes of blindness or other neurologic complaint.   PHYSICAL EXAMINATION:  GENERAL:  He is generally well-appearing, in no  distress, with heart rate 68, blood pressure 133/84, oxygen saturation  of 96% on room air.  He is afebrile.  NECK:  No jugular venous distension, thyromegaly or lymphadenopathy.  CHEST:  Respiratory effort is normal.  LUNGS:  Clear to auscultation.  CARDIAC:  He has a nondisplaced point of maximal cardiac impulse.  There  is regular rate and rhythm without murmur, rub, thrill or gallop.  ABDOMEN:  Soft, nondistended, nontender.  EXTREMITIES:  The right leg is warm and the left is cooler.  There is  some cyanosis of toes on both feet.  Unchanged ulcer, left  fifth toe,  with severe cyanosis of that toe.  SKIN:  Otherwise normal.  MUSCULOSKELETAL:  Normal.  NEUROLOGIC:  He is alert and orient x3 with normal affect and normal  neurologic exam.   IMPRESSION/RECOMMENDATION:  We will plan on proceeding to left leg and  possible left arm a angiography.  Possible endovascular  revascularization of the left leg.      Salvadore Farber, MD  Electronically Signed     WED/MEDQ  D:  09/03/2006  T:  09/03/2006  Job:  [id number]

## 2010-07-31 NOTE — Progress Notes (Signed)
[facility]                        PERIPHERAL VASCULAR OFFICE NOTE   David David, David David                      MRN:          [id number]  DATE:10/22/2006                            DOB:          1950/06/29    David David was seen in followup at the [facility] Peripheral Vascular  office on October 22, 2006. He is a 60 year old man with peripheral  arterial disease. He initially presented with what was thought to be  spontaneous cholesterol emboli to his left foot in 2007 with a  recurrence later that year. He underwent an evaluation for vasculitis as  well as a cardiac source of emboli and hypercoagulability. This  evaluation did not provide an explanation. He ultimately underwent a  lower extremity angiogram that demonstrated a 90% stenosis of the distal  left popliteal artery that was treated with a drug-eluting stent by Dr.  Samule David. Since the intervention was performed, David David has done very  well. He has had no further pain or claudication symptoms. He has been  relatively actively and is completely asymptomatic. He has had no  further symptoms involving the toes or feet. His followup ABIs from May  5 were 1.1 bilaterally with a TBI on the left of 0.68.   CURRENT MEDICATIONS:  1. Trazodone 50 mg daily.  2. Aspirin 81 mg daily.  3. Wellbutrin 450 mg daily.  4. Norvasc 10 mg daily.  5. Plavix 75 mg daily.  6. Valium 10 mg 3 times daily.  7. Lexapro 10 mg daily.   PHYSICAL EXAMINATION:  GENERAL:  He is alert and oriented in no acute  distress.  VITAL SIGNS:  Blood pressure is 126/80 in the right arm, 130/80 in the  left arm, heart rate is 80. Weight is 190 pounds, respirations 16.  HEENT:  Normal.  NECK:  Normal carotid upstrokes without bruits. Jugular venous pressure  is normal.  LUNGS:  Clear to auscultation bilaterally.  HEART:  Regular rate and rhythm without murmurs or gallops.  ABDOMEN:  Soft, nontender. No organomegaly, no abdominal  bruits.  EXTREMITIES:  No clubbing, cyanosis or edema. Femoral pulses are 2+ and  equal without bruit. Dorsalis pedis and posterior tibial pulses are 2+  and equal bilaterally. There are no skin ulcerations or cyanosis.   ASSESSMENT:  This is a 60 year old gentleman with previous atheroembolic  disease involving the left foot. He is asymptomatic after endovascular  treatment of severe popliteal stenosis. Will  followup with ABIs in a clinic visit in 6 months. He should continue  with aggressive risk factor  modification. We have given him paperwork for our Plavix assistance  program as ideally he would benefit from continued dual antiplatelet  therapy.     David Fells. [name], MD  Electronically Signed    MDC/MedQ  DD: 10/28/2006  DT: 10/30/2006  Job #: [id number]

## 2010-08-03 NOTE — Assessment & Plan Note (Signed)
[facility]                                 ON-CALL NOTE   SHERWOOD, CASTILLA                        MRN:          [id number]  DATE:07/01/2006                            DOB:          07-22-1950    [name] called from [phone] at 8:24 p.m. of July 01, 2006.  The  patient is also being seen by Dr. Samule Ohm and has had problems with bad  circulation in his feet.  He has a black toe that is facing amputation.  Apparently today the patient had a black out and has severe pain in his  feet since the toe was black.   I recommended he go to the emergency room to be evaluated.     Lelon Perla, DO  Electronically Signed    Shawnie Dapper  DD: 07/01/2006  DT: 07/02/2006  Job #: [id number]   cc:   Titus Dubin. Alwyn Ren, MD,FACP,FCCP

## 2010-08-03 NOTE — Progress Notes (Signed)
[facility]                        PERIPHERAL VASCULAR OFFICE NOTE   [name], [name]                      MRN:          [id number]  DATE:06/25/2006                            DOB:          23-May-1950    HISTORY OF PRESENT ILLNESS:  [name] is a 60 year old gentleman who  suffered what we felt was a spontaneous cholesterol emboli about a year  ago.  We recommended smoking cessation and Norvasc.  Unfortunately, he  quit his Norvasc and went back to smoking.  He then presented with new  left fifth toe pain on March 26 of this year.  ABI done on that day was  1.2 on the right and 0.92 on the left with TBI of 0.92 on the right and  0.54 on the left.   He saw Dr. [name] on the day of the study.  He felt it was most likely  recurrent cholesterol emboli and resumed the Norvasc and advised smoking  cessation.  [name] has been compliant with both of these  recommendations.  He continues to be troubled by severe pain in his left  fifth toe.  He additionally has new dependent rubor in the fourth,  second, and first toes of that foot; however, they are not painful.   CURRENT MEDICATIONS:  1. Protonix 40 mg daily.  2. Multivitamins.  3. Effexor.  4. Aspirin 81 mg daily.  5. Valium.  6. Wellbutrin.  7. Norvasc 5 mg daily.   PHYSICAL EXAMINATION:  GENERAL:  He is generally well-appearing in no  distress.  VITAL SIGNS:  Heart rate 80, blood pressure 120/90, weight 197 pounds.  SKIN:  Remarkable for the absence of ulcerations on his feet; however,  there is livedo reticularis on the distal half of his left foot and  marked cyanosis of the left fifth toe, moderate cyanosis of the left  fourth, second, and first toes.  The right foot is entirely normal.  Hands are entirely normal.  NECK:  He has no jugular venous distention, thyromegaly, or  lymphadenopathy.  Carotid pulses are 2+ bilaterally without bruits.  LUNGS:  Clear to auscultation.  CARDIAC:  He has a nondisplaced point of maximal cardiac impulse.  There  is a regular rate and rhythm without murmur, rub or gallop.  ABDOMEN:  Soft, nontender, nondistended.  There is no  hepatosplenomegaly.  Bowel sounds are normal.  EXTREMITIES:  As described above.  There is no edema.   IMPRESSION/RECOMMENDATIONS:  Ischemic pain of left fifth toe.  Presentation is similar to that of January, 2007.  At that time, he had  no evidence of a popliteal aneurysm, no evidence of hemodynamically  significant stenosis from the groin to the ankle.  No evidence of  abdominal aortic aneurysm.  With popliteal aneurysm thus excluded, the  differential diagnosis includes  spontaneous cholesterol emboli from a plaque higher up versus Buerger's  disease.  We will  check hypercoagulability study today.  We will increase his narcotics.  I emphasized the critical importance of staying off the tobacco.  We  will see him back in three weeks time.  Salvadore Farber, MD  Electronically Signed    WED/MedQ  DD: 06/25/2006  DT: 06/25/2006  Job #: [id number]

## 2010-08-03 NOTE — H&P (Signed)
NAME:  David David, David David NO.:  [id number]   MEDICAL RECORD NO.:  [id number]          PATIENT TYPE:  OUT   LOCATION:  MLAB                         FACILITY:  [facility]   PHYSICIAN:  Unice Cobble, MD     DATE OF BIRTH:  [birth year]   DATE OF ADMISSION:  07/02/2006  DATE OF DISCHARGE:                              HISTORY & PHYSICAL   HISTORY OF PRESENT ILLNESS:  This is a 60 year old white male with a  history of probable cholesterol emboli to the left fifth toe, who  presents with transient blindness in the right eye.  The patient had a  presyncopal event last week after standing from sitting while getting  out of the car to pump gas.  He stood up, took out his credit card, and  had a brief episode where he felt weak all over and almost passed out.  It took him a full 45 minutes to recover from this.  He had no  associated headache, chest pain, or shortness of breath.  Tonight he had  a 45 second episode of transient complete right eye blindness.  He had  no associated symptoms of headache, chest pain, shortness of breath,  etc.  This resolved on its own.  He, furthermore, complains of 3 weeks  of pain between his shoulders -- which is unrelieved by anything, but  appears to be positional as he can make it worsen by moving his back.  He also believes his left fifth toe is getting worse (i.e., more dusky).  Otherwise, he has no fever or chills, nausea or vomiting, diarrhea or  constipation, abdominal pain, weight loss, or other issues.   PAST MEDICAL HISTORY:  1. Probable cholesterol emboli to left fifth toe.  2. Smoking history (heavy).  3. Depression.  4. Gastroesophageal reflux disease.  5. Knee arthroscopy.   ALLERGIES:  NO KNOWN DRUG ALLERGIES.   MEDICATIONS:  1. Protonix 40 mg daily.  2. Multivitamin.  3. Effexor 75 mg 3 tablets in the morning.  4. Aspirin 81 mg daily.  5. Valium 10 mg t.i.d.  6. Wellbutrin 450 mg daily.  7. Norvasc 5 mg daily.  8.  Flaxseed oil.   SOCIAL HISTORY:  He quit smoking 2 weeks ago.  He smoked less than a  pack per day prior to that, but has a history of up to 3-4 packs per day  in the past (starting at age 62).  No alcohol or drugs.   FAMILY HISTORY:  His father died of an MI at the age of 65.  His mother  had atrial fibrillation and died at the age of 28 of natural causes.   REVIEW OF SYSTEMS:  Complete review of systems was done and found to be  negative, except for as stated in the HPI.   PHYSICAL EXAMINATION:  VITAL SIGNS:  Temperature 97.4, blood pressure  120/83, pulse 92, respiratory rate 20, oxygen saturation 98% on room  air.  GENERAL:  He is in no acute distress.  HEENT:  Shows MMM.  PERRLA.  EOMI.  NECK:  Without bruits or jugular venous  distention.  CHEST:  Clear to auscultation bilaterally.  HEART:  Regular rate and rhythm without murmurs, rubs or gallops.  ABDOMEN:  Positive bowel sounds; soft, nontender and nondistended.  EXTREMITIES:  Well perfused on the right.  His left is cool, with 2+  pulses in his posterior tibial and dorsalis pedis.  His fifth digit is  darkened; the remainder of his toes are dusky.  NEUROLOGIC:  He is alert and oriented x3.  His cranial nerves II-XII are  intact.  He has normal strength and tone bilaterally in his upper and  lower extremities.   LABORATORY REVIEW:  A normal head CT.  A normal chest x-ray.  His ECG  shows normal sinus rhythm at a rate of 83.  He has poor R-wave  progression.  He is a QM3.  Nonspecific T-wave flattening in V4 through  V6.  His CBC and CHEM panel are unremarkable, with the exception of a  mildly low potassium (at 3.5), and a creatinine of 1.3.   ASSESSMENT AND PLAN:  This is a 60 year old white male with transient  blindness, and a history of questionable cholesterol embolization versus  (Buerger's disease).  His distal pulses are actually quite good.  ABIs  recently were only mildly abnormal, with an ABI on the right of 1.2  and  an ABI on the left at 0.92.  His toe brachial indices, however, were  abnormal, with the right being 0.92 and the left being 0.54.  His  presyncope is possibly related to emboli, but may also represent  orthostatic hypotension.  He had just stood from the car to pump his  gas.  The blindness would be new territory for emboli with a potential  central cardiac source.  Will check an echocardiogram and carotid  ultrasounds to begin the workup.  Otherwise, I will continue his Arterex  and consider leg arterial imaging if the remainder of his foot worsens.  Consider neurology consult  for transient blindness in the morning.      Unice Cobble, MD  Electronically Signed     ACJ/MEDQ  D:  07/02/2006  T:  07/02/2006  Job:  [id number]

## 2010-08-03 NOTE — Procedures (Signed)
[facility]  Patient:    David David, David David                      MRN: [id number] Proc. Date: 09/03/00 Adm. Date:  [id number] Attending:  Thyra Breed CC:         Genene Churn. Love, M.D.   Procedure Report  PROCEDURE:  Occipital nerve block on the right side.  DIAGNOSIS:  Right occipital headaches with history of cervical strain injury and underlying cervical spondylosis.  INTERVAL HISTORY:  The patient noted a more long-lasting reduction in pain after the last injection, but it has recurred over the past 2-3 days and has just not gone down below 5/10.  There are no new neurologic symptoms.  PHYSICAL EXAMINATION:  Blood pressure 151/76, heart rate 61, respiratory rate 16, O2 saturations 99%.  Pain level is 5/10.  The patients neuro exam is grossly unchanged.  He does exhibit tenderness over the right lesser occipital groove.  This area was marked.  DESCRIPTION OF PROCEDURE:  The area marked was prepped with alcohol and injected with a 25 gauge needle with 3 cc of solution.  The solution consisted of 4 cc of 1% lidocaine mixed with 4 cc of 0.5% levobupivacaine with 20 mg of Medrol in the total volume.  I advised the patient that I was adding corticosteroids to see whether we could a more sustained improvement from the injection.  Postinjection condition - stable.  DISCHARGE INSTRUCTIONS: 1. Resume previous diet. 2. Limitations on activities per instruction sheet.  He was given a note to    remain at rest for the rest of today. 3. Continue on current medications. 4. Follow up with me in one week to see whether adding the corticosteroids has    made any significant difference in a sustained relief of the discomfort.    If he does notice any sustained improvements, he may need to be reevaluated    by Dr. Sandria Manly or Dr. Thad Ranger for possible Botox therapy. DD:  09/03/00 TD:  09/03/00 Job: [id number]

## 2010-08-03 NOTE — H&P (Signed)
NAME:  David David, David David NO.:  [id number]   MEDICAL RECORD NO.:  [id number]          PATIENT TYPE:  IPS   LOCATION:  0403                          FACILITY:  [facility]   PHYSICIAN:  Jasmine Pang, M.D. DATE OF BIRTH:  1951-03-15   DATE OF ADMISSION:  02/04/2006  DATE OF DISCHARGE:                       PSYCHIATRIC ADMISSION ASSESSMENT   IDENTIFYING INFORMATION:  This is a 60 year old separated white male.  He was sent over from Dr. Carie Caddy office, his private psychiatrist, due  to increasing depression with suicidal ideation.  This was because of  financial issues.  It is worrisome because two of his brothers have  suicided.  He says that his wife has been gone for four years.  He has  exhausted his finances.  He has had suicidal ideation on and off since  May of 2003.  He states that he had a job change where he had to work  nights and it flipped him out.   PAST PSYCHIATRIC HISTORY:  He has had no prior inpatient.  He has been  an outpatient the past four years of Dr. Evelene Croon.   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  He  has been married twice.  He is now separated from wife #2 for four  years.  He has a 72 year old daughter.  He has received disability  retirement income because of depression since March of 2007.   FAMILY HISTORY:  His brothers used alcohol and drugs.  They also  suicided.  He has a sister with depression.   ALCOHOL/DRUG HISTORY:  He smokes one pack per day.   PRIMARY CARE PHYSICIAN:  Dr. Marga Melnick.   MEDICAL PROBLEMS:  He has no known medical problems although he has had  numerous visits to the ED.   MEDICATIONS:  He is currently prescribed Effexor 225 mg p.o. q.d.,  Wellbutrin XL 300 mg p.o. q.d., Abilify 15 mg p.o. q.d., Remeron 30 mg  p.o. q.h.s. and Valium 10 mg p.o. t.i.d.   ALLERGIES:  No known drug allergies.   PHYSICAL EXAMINATION:  Somewhat thin white male who appears in distress.  His vital signs on  admission show he is 6 feet 4 inches tall and weighs  182 pounds.  Blood pressure is 135/83, heart rate 96 and respirations  20.  He states that he has recently lost weight, around 15 pounds, in  the last 2-3 months.  He attributes this to his increased worry.  However, he does have an elevated blood sugar at 120 and we will  evaluate for type 2 diabetes.  The remainder of his physical examination  is unremarkable.   LABORATORY DATA:  His other lab work that is ready shows no  abnormalities of CBC and he had no other abnormalities of his  chemistries other than the elevated glucose at 120.  His TSH is 3.649.   MENTAL STATUS EXAM:  He is alert.  He is casually groomed.  He is  adequately dressed and nourished.  His speech is slow.  His mood is  depressed.  His affect is flat.  His  motor is somewhat neurovegetative.  His thought processes are not very clear, rational or goal-oriented.  Judgment and insight are poor.  Concentration and memory are intact.  Intelligence is at least average.  He had suicidal ideation.  He has no  homicidal ideation.  He has no auditory or visual hallucinations.  Family members have removed guns out of the house.   DIAGNOSES:  AXIS I:  Major depressive disorder/Anxiety disorder.  AXIS II:  Deferred.  AXIS III:  Elevated glucose, rule out diabetes mellitus.  AXIS IV:  Severe.  AXIS V:  30.   PLAN:  To admit for safety and stabilization.  Dr. Katrinka Blazing will be  adjusting his medications as indicated.  We will be checking a fasting  blood sugar and a hemoglobin A1C in the morning and we need to get the  social worker to help him call a lawyer regarding selling his house,  etc.  He is separated from his wife of four years but that is it.  Apparently, the title is in both of their names.      Mickie Leonarda Salon, P.A.-C.      Jasmine Pang, M.D.  Electronically Signed    MD/MEDQ  D:  02/05/2006  T:  02/05/2006  Job:  [id number]

## 2010-08-03 NOTE — Discharge Summary (Signed)
NAME:  David David, David David NO.:  [id number]   MEDICAL RECORD NO.:  [id number]          PATIENT TYPE:  IPS   LOCATION:  0403                          FACILITY:  [facility]   PHYSICIAN:  Jasmine Pang, M.D. DATE OF BIRTH:  1950/08/28   DATE OF ADMISSION:  02/04/2006  DATE OF DISCHARGE:  02/07/2006                               DISCHARGE SUMMARY   IDENTIFYING INFORMATION:  This is a 60 year old separated Caucasian male  who was admitted on a voluntary basis on February 04, 2006.   HISTORY OF PRESENT ILLNESS:  The patient was sent over from his  psychiatrist's office (Dr. Evelene Croon) due to escalating depression with  suicidal ideation.  He had been having significant financial issues.  He  has a history of two brothers who have suicided.  He says that his wife  has been gone for 4 years, and he is exhausted his finances.  He has had  suicidal ideation on and off since May 2003.  He had to change his work  schedule tonight, and states this flipped him.  He has no previous  inpatient treatment.  He has been seeing Dr. Evelene Croon for the past 4 years  as an outpatient.   The patient is currently on the Effexor XR 225 mg daily and Wellbutrin  XL 300 mg daily, Remeron 30 mg daily, Abilify 15 mg daily, and a Valium  10 mg by mouth three times daily.   PHYSICAL FINDINGS:  There were no acute medical problems.  The patient  was healthy on admission.   ADMISSION LABORATORIES:  Hemoglobin A1c was within normal limits.  His  urine drug screen was negative except for benzodiazepines and (the  patient takes Valium).  CBC was within normal limits.  A routine  chemistry panel was grossly within normal limits except for an elevated  glucose of 120 (70-99).  TSH was normal at 3.649.   HOSPITAL COURSE:  Upon admission, the patient was restarted on Effexor  XR 225 mg daily, Wellbutrin XL 400 mg by mouth daily, Remeron Sof tab 30  mg p.o. q.h.s., Abilify 15 mg p.o. q.d. and Valium 10 mg p.o. t.i.d.   On  February 05, 2006, he was also ordered Ambien 5 mg if he awakens and  still has 3-4 hours to sleep.  On February 06, 2006 Wellbutrin was  increased to 450 mg p.o. q. a.m.  The patient tolerated these  medications well with no significant side effects.   Upon admission, the patient stated he was here because of thoughts of  death.  He has had numerous stressors.  His wife left him 4 years ago  though they still remained friends.  He cannot afford to keep his house.  He states he had thought of blowing his brains out.  He gave the guns to  his daughter.  He is currently on disability but cannot afford the  living expenses.  He discussed deaths of some other family members  besides his brothers who committed suicide.  On February 06, 2006, the  patient had slept well and was eating better.  He  stated he no longer  felt suicidal.  He stated he had felt things crumbling underneath him.  He had a close brother who died in 04-09-22 after surgery.  The holidays  make him think of numerous feelings of grief.  He states he is not one  to interact well with others.  He does have support from both his  daughter and his wife from whom he is separated.  She remains a good  friend.  He has also seen Dr. Evelene Croon for 4 years and sees Fred May for  therapy.   On February 07, 2006, the patient's mental status had improved markedly  from admission.  He was friendly, cooperative, conversant, with good eye  contact.  Speech was normal rate and flow.  Psychomotor activity was  within normal limits.  His mood was less depressed and anxious.  Affect  wider range.  There was no suicidal or homicidal ideation.  No thoughts  of self-injurious behavior.  No auditory or visual hallucinations.  No  paranoia or delusions.  Thoughts were logical and goal-directed.  Thought content, no predominant theme; and the cognitive was grossly  within normal limits.  It was felt the patient was stable enough to  return home  safely today.  We had originally planned to discharge him  over the weekend but since this was Friday, he was interested in leaving  today.   DISCHARGE DIAGNOSES:  AXIS I:  Major depressive disorder, recurrent,  severe, without psychosis.  Anxiety disorder now otherwise specified.  AXIS II:  None.  AXIS III:  None.  AXIS IV:  Severe (problems with primary support group, housing problem,  economic problem, other psychosocial problems).  AXIS V:  GAF is 50 upon discharge.  GAF was 30 upon admission.  GAF was  70, highest past year.   DISCHARGE PLAN:  There were no specific activity level or dietary  restrictions.   DISCHARGE MEDICATIONS:  The patient was treated with Effexor XR 75 and  was discharged on Effexor XR 225 mg by mouth every morning, Remeron 30  mg by mouth nightly, Abilify 15 mg daily, diazepam 10 mg three times  daily, Wellbutrin XL 450 mg in the morning.   POST HOSPITAL CARE PLANS:  The patient will return to see Dr. Evelene Croon on  Tuesday February 25, 2006 at 8:45 a.m.  He will also see Merlyn Albert May on  Tuesday February 11, 2006 at 9:00 a.m.      Jasmine Pang, M.D.  Electronically Signed     BHS/MEDQ  D:  02/07/2006  T:  02/07/2006  Job:  [id number]
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
## 2010-08-03 NOTE — Op Note (Signed)
NAME:  David David, David David                         ACCOUNT NO.:  [id number]   MEDICAL RECORD NO.:  [id number]                   PATIENT TYPE:  AMB   LOCATION:  NESC                                 FACILITY:  [facility]   PHYSICIAN:  Ronald L. Ovidio Hanger, M.D.           DATE OF BIRTH:  30-Mar-1950   DATE OF PROCEDURE:  11/16/2002  DATE OF DISCHARGE:                                 OPERATIVE REPORT   DIAGNOSIS:  Symptomatic left spermatocele.   OPERATIVE PROCEDURE:  Left spermatocelectomy.   SURGEON:  Lucrezia Starch. Earlene Plater, M.D.   ANESTHESIA:  LMA.   ESTIMATED BLOOD LOSS:  15 mL.   TUBES:  Quarter-inch Penrose drain.   COMPLICATIONS:  None.   INDICATIONS FOR PROCEDURE:  Mr. Granja is a very nice 60 year old white  male, who basically has been having left scrotal and groin pain.  He has had  bilateral spermatoceles, left greater than right and with the continuing  pain, he would like to have it removed.  He understands the risks of  bleeding, infection of the procedure and the fact that the pain may not go  away and after understanding risks, benefits, and alternatives has elected  to proceed.   PROCEDURE IN DETAIL:  The patient was placed in the supine position.  After  proper LMA anesthesia, was prepped and draped with Betadine in a sterile  fashion.  A transverse left scrotal incision was made, with sharp dissection  was carried down through dartos tunic.  The tunica vaginalis was adherent to  the tunica albuginea of the testicle, and the potential space around the  testicle was nonexistent, obviously from prior inflammation.  The  spermatocele was therefore directly approached.  It was noted to be  multilocular at the head of the epididymis and was serially dissected with  layers taken down to it.  There was a total of approximately five separate  spermatoceles in a grape-like cluster.  These were taken down to their point  of origin on the epididymis, carefully avoiding the  vasculature.  A clamp  was placed at the base.  It was removed, and the neck of it was oversewn  with 4-0 chromic catgut stick ties.  Good hemostasis was noted to be  present.  The testicle was noted to have good blood supply.  This testicle  and adnexa were placed back into the scrotal sac.  Penrose drain was placed  through a right lateral stab incision and sutured in place with 3-0 chromic  catgut.  Dartos tunic was then closed with a running 3-0 chromic catgut, and  the skin was closed with a running horizontal mattress 4-0 chromic catgut  and dressed with fluffs and a scrotal support.  The patient tolerated the  procedure well with no complications.  He was taken to the recovery room  stable.  Ronald L. Ovidio Hanger, M.D.    RLD/MEDQ  D:  11/16/2002  T:  11/16/2002  Job:  [id number]

## 2010-08-03 NOTE — Op Note (Signed)
NAME:  David David, David David               ACCOUNT NO.:  [id number]   MEDICAL RECORD NO.:  [id number]          PATIENT TYPE:  AMB   LOCATION:  SDS                          FACILITY:  [facility]   PHYSICIAN:  Salvadore Farber, MD  DATE OF BIRTH:  [birth year]   DATE OF PROCEDURE:  07/09/2006  DATE OF DISCHARGE:  07/09/2006                               OPERATIVE REPORT   PROCEDURE:  Bilateral lower extremity angiography, stenting of the left  popliteal artery via contralateral approach, intravascular ultrasound of  the left anterior tibial artery and popliteal arteries, Starclose  closure of the right common femoral arteriotomy site.   INDICATIONS:  Mr. Oestreich is a 60 year old gentleman who suffered what  appeared to be spontaneous cholesterol emboli to his foot approximately  one year ago.  We treated him with aspirin, smoking cessation, and  Norvasc with resolution of his symptoms.  He represented approximately  six weeks ago with recurrent symptoms including blue left fifth toe.  He  had gone back to smoking and had stopped the Norvasc.  We resumed these.  However, his symptoms did not resolve.  He then presented last week with  an episode of monocular blindness.  Extensive evaluation for a proximal  source of embolus, intracranial atherosclerosis, connective tissue  disease, and hypercoaguable state was unrevealing.  The two most likely  remaining diagnoses were therefore spontaneous cholesterol emboli and  Buerger's disease.  The patient is brought to angiography today for  lower extremity angiography to attempt to clarify the diagnosis and also  with the possibility of percutaneous treatment.   TECHNIQUE:  Informed consent was obtained.  Under 1% lidocaine local  anesthesia, a 5-French sheath was placed in the right common femoral  artery using the modified Seldinger technique.  A pigtail catheter was  placed in the infrarenal abdominal aorta.  Abdominal aortography was  performed by power  injection.  I then used a crossover catheter to  selectively engage the left common iliac artery.  I advanced a Wholey  wire into the SFA and over exchanged for an Indole catheter.  Angiography was then performed.  This demonstrated a severe stenosis in  the distal left popliteal artery just before the origin of the anterior  tibial.  While his ABIs had been normal and he had not had any calf  claudication, it seems that this lesion has caused spontaneous  cholesterol emboli with limb threatening ischemia on two separate  occasions.  I, therefore, decided to attempt to pacify the plaque by  overlying it with a stent.   To that end, I exchanged for a 90 cm 6-French Terumo glide sheath which  I advanced over a wire and positioned in the left popliteal artery via  the contralateral approach.  I then placed Prowater wires into the  distal portion of both the posterior tibial and the anterior tibial  arteries.  I then began with intravascular ultrasound of the anterior  tibial to assess landing zone and size the vessels.  This demonstrated  the popliteal artery, in its healthy section, to measure 4.7 x 5.1 mm.  There was  a very short landing zone distally in which the stent could be  positioned so as not to jail either the DP trunk or the anterior tibial.   With this information, we then proceeded to stenting.  I placed a 3.5 x  13 mm Cypher stent in the distal popliteal artery, taking it over the  anterior tibial wire.  I pulled the wire in the posterior tibial.  I  carefully positioned the stent so as to avoid jailing the TP trunk.  I  deployed it at 12 atmospheres.  I then replaced the wire into the  posterior tibial taking care not to get under a stent strut.  I then  post dilated the stent using a 4.5 x 12 mm Quantum balloon at 16  atmospheres.  I then further post dilated it using a 5 x 12 mm Quantum  at 16 atmospheres.  Final angiography demonstrated no residual stenosis,  no  dissection, and normal flow distally.  There was no evidence of  embolization to the foot.  There was excellent flow into all toes,  particularly from the posterior tibial.  The patient tolerated the  procedure well and 600 mg of oral Plavix was administered at the  completion of the procedure.  The arteriotomy was then closed using a  Starclose device.  Complete hemostasis was obtained.  He was then  transferred to the holding room in stable condition.   COMPLICATIONS:  None.   FINDINGS:  1. Abdominal aorta:  Normal vessel.  2. Right leg:  Normal vasculature throughout its length with no      evidence of atherosclerosis and or aneurysm formation.  3. Left leg:  Normal vasculature throughout with the sole exception of      a focal 90% stenosis in the distal portion of the popliteal artery      which was stented to no residual using a drug eluting stent.   IMPRESSION/RECOMMENDATION:  Successful stenting of the focal  atherosclerotic lesion of the distal left popliteal artery.  This lesion  appears to have been the culprit for at least two episodes of  spontaneous cholesterol emboli.  He should be maintained on aspirin  indefinitely and Plavix for at least a year.  We will follow his fifth  toe for evidence of healing.      Salvadore Farber, MD  Electronically Signed     WED/MEDQ  D:  07/09/2006  T:  07/09/2006  Job:  [id number]

## 2010-08-03 NOTE — Consult Note (Signed)
NAME:  SIXTO, BOWDISH NO.:  [id number]   MEDICAL RECORD NO.:  [id number]          PATIENT TYPE:  INP   LOCATION:  5531                         FACILITY:  [facility]   PHYSICIAN:  Salvadore Farber, MD  DATE OF BIRTH:  [birth year]   DATE OF CONSULTATION:  07/03/2006  DATE OF DISCHARGE:                                 CONSULTATION   REQUESTING PHYSICIAN:  Valerie A. Felicity Coyer, MD   REASON FOR CONSULTATION:  Syncope, transient monocular blindness.   HISTORY OF PRESENT ILLNESS:  Mr. Aguilera is a 60 year old patient well-  known to me.  I met him in in 2007 when he had ischemia of his left foot  despite preserved peripheral pulses.  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  I recommended  smoking cessation and initiation of Norvasc.  Unfortunately, he quit the  Norvasc and went back to smoking.  He then presented in late March of  this year with a recurrent ischemia in his left fifth toe.  Over the  subsequent weeks.  This worsened to extend to the fourth, third, and  first toes on that foot.  He had no other symptoms at that time.  We  resumed the Norvasc and he quit smoking.  The toe has remain painful.   He is now admitted having had an episode of presyncope on Saturday and  an episode of transient monocular blindness on Tuesday.  The presyncope  occurred while he was pumping gas, at mid day.  He had no premonitory  chest pain, dyspnea or dizziness.  While pumping gas he felt very  lightheaded and sank to his knees.  He did not loose consciousness.  He  then rose, had some transient visual blurriness in both eyes, but then  recovered fully within a minute.  He has had no recurrence of his  symptoms since then.   Tuesday evening, he had episode of transient right monocular, complete  blindness, lasting approximately 30 seconds and resolving spontaneously.  He had no associated weakness.  He had no palpitations or  presyncope  associated with this.   He was admitted last night by the medicine service.  They have obtained  an echocardiogram (with out bubble study) that demonstrated normal left  ventricular systolic function, and normal valvular morphology and  function.  They also obtained an MRI and MRA of his head which  demonstrated a subtle hyperintensity in the right frontal lobe on  diffuse and weighted imaging consistent with artifact versus a subtle  ischemic insult.  In addition, the MRI demonstrated nonspecific gray-  white changes which could be consistent with vasculitis or might be  consistent with chronic ischemic changes.  On monitor he has had no  arrhythmia with the exception of rare PVC.   PAST MEDICAL HISTORY:  1. Depression including a hospitalization for such previously.  2. GERD.  3. Knee arthroscopy  4. Tobacco abuse.  5. 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   ALLERGIES:  NKDA.   CURRENT MEDICATIONS:  1. Morphine.  2. Codeine.  3  Multivitamin.  1. Effexor 225 mg daily.  2. Aspirin 81 mg daily.  3. Protonix 40 mg daily.  4. Valium 10 mg q.8 h.  5. Wellbutrin 450 mg daily.  6. Norvasc 5 mg daily.  7. Lovenox 40 mg daily.  8. Ambien 5 mg q.h.s. p.r.n.   SOCIAL HISTORY:  Quit smoking approximately 2 weeks ago.  Previously  smoked up to 3-4 packs per day.  Denies alcohol or illicit drug use.   FAMILY HISTORY:  Father died of MI at 37.  Mother died at 42 of old  age.   REVIEW OF SYSTEMS:  Negative in detail except as above.   PHYSICAL EXAMINATION:  VITAL SIGNS: He is generally well-appearing in no  distress with heart rate 87, blood pressure 126/78, oxygen saturation of  98% on room air; and a weight of 89.3 kg.  He is afebrile.  HEENT:  Normal.  SKIN:  Exam is normal except for an ischemic left fifth toe.  There is  the notable absence of cyanosis, rubor, and livedo reticularis in the  remaining toes of his left foot, dorsum of his left  foot, and the  entirety of his right foot.  There are no ulcers on either his feet or  his hands.  HEENT is normal.  MUSCULOSKELETAL EXAM:  Normal.  NECK:  There is no jugular venous distension, thyromegaly, or  lymphadenopathy.  LUNGS:  Clear to auscultation.  HEART:  He has a nondisplaced point of maximal cardiac impulse.  There  is a regular rate and rhythm without murmur, rub or gallop.  ABDOMEN:  Soft, nondistended, nontender.  There is no  hepatosplenomegaly.  Bowel sounds are normal.  EXTREMITIES:  Warm without clubbing, cyanosis, edema, or ulceration.  Carotid pulses 2+ bilateral without bruit.  No midline pulsatile mass or  abdominal bruit.  Femoral pulses 2+ bilateral without bruit.  Popliteal  pulses 2+ bilaterally.  DP and PT pulses are 2+ on the right.  On the  left the DP is 1+.  The PT is trace.   LABORATORY STUDIES:  Notable for creatinine of 1.0, potassium 3.7,  normal transaminases.  Hematocrit 39 and platelet count 146.  Hypercoagulability panel remarkable for the absence of factor V Leiden  mutation, the absence of prothrombin II gene mutation, normal  Cardiolipin, IgG and IgM, normal beta two glycoprotein no lupus  anticoagulant; and a slightly elevated protein C and protein S levels.  Antithrombin III level was normal.   IMPRESSION/RECOMMENDATIONS:  1. Presyncope, etiology unclear.  No clear history of arrhythmia.  May      be orthostatic due to multiple psychiatric medicines which may      contribute to his orthostasis as well as his Norvasc which might      precipitate this as well.  Will check orthostatic vitals.  Continue      cardiac monitor.  His preserved left ventricular systolic function      is reassuring.  2. Transient monocular blindness.  This occurred in the setting of a      clearly ischemic left foot.  This raises concern for vasculitis     versus central source of emboli.  Will check erythrocyte      sedimentation rate and urinalysis looking  for proteinuria and      hematuria.  Will also check a CT angiogram of his chest to look for      aortic atheroma.  Will check a TEE with bubble study to exclude an  intracardiac source.  In the interim, will      continue him on his aspirin.  3. Ischemic left foot.  The fifth toe is nearing amputation; this is      clearly beginning to delineate.  This need not be handled during      this hospitalization, however.      Salvadore Farber, MD  Electronically Signed     WED/MEDQ  D:  07/03/2006  T:  07/04/2006  Job:  [id number]   cc:   Vikki Ports A. Felicity Coyer, MD

## 2010-08-03 NOTE — Procedures (Signed)
[facility]  Patient:    David David, David David                      MRN: [id number] Proc. Date: 08/28/00 Adm. Date:  [id number] Attending:  Thyra Breed CC:         Genene Churn. Love, M.D.  Titus Dubin. Alwyn Ren, M.D. [facility]   Procedure Report  PROCEDURE:  Occipital nerve block.  DIAGNOSIS:  Right occipital headaches with history of cervical strain injury and cervical spondylosis.  INTERNAL HISTORY:  The patient noted about a 36-hour improvement in his right occipital and neck discomfort following his last injection.  Nevertheless, he is interested in another injection.  He rates his pain as 5/10 today.  He describes no new neurologic symptoms.  EXAMINATION:  Blood pressure 149/93, heart rate 75, respiratory rate is 95%. Pain level is 5/10.  He exhibits tenderness over the right lesser occipital groove with good range of motion of his neck otherwise.  Neurologic examination is otherwise unchanged.  DESCRIPTION OF PROCEDURE:  After informed consent was obtained, the lesser occipital groove on the right side was prepped with alcohol and I injected. 2 cc of local anesthetic with Medrol into the area.  The mixture consisted of 2 cc of 1% lidocaine mixed with 2 cc of 0.5% levobupivacaine with 10 mg of Medrol in total volume.  POSTPROCEDURE CONDITION:  Stable.  The patient noted marked improvement in his pain, it was down to 0-1/10.  His vital signs remained stable.  DISCHARGE INSTRUCTIONS: 1. Resume previous diet. 2. Limitation of activities per instruction sheet. 3. Continue current medications. 4. Followup with me in one week. 5. I gave him a note to be out of work from his last visit, as well as    from todays visit. DD:  08/28/00 TD:  08/28/00 Job: [id number]

## 2010-08-03 NOTE — Procedures (Signed)
[facility]  Patient:    David David, David David                      MRN: [id number] Proc. Date: 09/11/00 Adm. Date:  [id number] Attending:  Thyra Breed CC:         Genene Churn. Love, M.D.   Procedure Report  PREOPERATIVE DIAGNOSIS:  Right occipital headache with history of cervical strain syndrome and underlying cervical spondylosis.  POSTOPERATIVE DIAGNOSIS:  Right occipital headache with history of cervical strain syndrome and underlying cervical spondylosis.  PROCEDURE:  Occipital nerve block on the right side.  SURGEON:  Thyra Breed, M.D.  INTERVAL HISTORY:  The patient has noted a more lasting improvement in his neck discomfort following the last injection.  He has had no untoward effects from the small amount of corticosteroids injected.  He rates his pain at 2 out of 10.  PHYSICAL EXAMINATION:  Blood pressure 142/66, heart rate 61, respiratory rate 18, and O2 saturations 98%.  Pain level is 2 out of 10.  He shows good healing over his previous injection site.  Neurologic exam is grossly unchanged.  DESCRIPTION OF PROCEDURE:  After informed consent was obtained, the patient was placed in the sitting position and right lesser occipital groove was identified and marked.  I prepped the area with alcohol.  I injected 3 cc of local anesthetic with Medrol with a 25-gauge.  The needle was removed intact. The local anesthetic and Medrol concentration consisted of a mixture of 3 cc of 1% lidocaine mixed with 3 cc of 0.5% levobupivacaine with 20 mg of Medrol.  POSTPROCEDURE CONDITION:  Stable.  DISCHARGE INSTRUCTIONS: 1. Resume previous diet. 2. Limitation and activities per instruction sheet. 3. Continue on current medications. 4. Followup with me on an as needed basis.  He was encouraged to go ahead and    follow up with Dr. Sandria Manly to let him know how he is doing. DD:  09/11/00 TD:  09/11/00 Job: [id number]

## 2010-08-03 NOTE — Progress Notes (Signed)
[facility]                        PERIPHERAL VASCULAR OFFICE NOTE   David David, David David                      MRN:          [id number]  DATE:06/11/2006                            DOB:          05-Jan-1951    CHIEF COMPLAINT:  Left 5th toe pain.   David David is a 60 year old male with a history of spontaneous  cholesterol emboli who presents with 2 days of left 5th toe pain and  discoloration.  He had similar problems in the past involving multiple  toes on the left foot.  At this point, he complains of pain and a purple  discoloration of his left 5th toe only.  This began without any  antecedent injury.  He has no other complaints.  He has had no fever,  rash, or other problems.   CURRENT MEDICATIONS:  Include:  1. Protonix 40 mg daily.  2. Flax seed oil 1000 mg daily.  3. Multivitamin daily.  4. Effexor 75 mg 3 daily.  5. Aspirin 81 mg daily.  6. Valium 30 mg daily.  7. Wellbutrin 450 mg daily.   PHYSICAL EXAMINATION:  Patient is alert and oriented.  He is in no acute  distress.  His weight is 199 pounds.  Blood pressure 126/68.  Heart rate 72.  Respiratory rate 16.  HEENT:  Normal.  NECK:  Normal carotid upstrokes without bruits.  Jugular venous pressure  is normal.  LUNGS:  Clear to auscultation bilaterally.  HEART:  Regular rate and rhythm without murmurs or gallops.  ABDOMEN:  Soft and non-tender.  No organomegaly.  EXTREMITIES:  There is no rash.  There is no livedo reticularis.  There  is no clubbing or edema present.  The left 5th toe is tender to  palpation and appears cyanotic.  The other toes are not affected.  Peripheral pulses are intact and equal throughout.  The feet are warm  bilaterally.   ABIs were performed prior to this office visit, and they are normal at  1.2 on the right and 0.92 on the left.  Toe brachial indices are 0.92 on  the right, and 0.54 on the left.   ASSESSMENT:  David David is a 60 year old male who  has evidence of what  is likely recurrent spontaneous cholesterol emboli.  His left 5th toe is  affected.  There is really no other evidence of vascular disease based  on his exam or his ankle branchial indices.  While his toe brachial  indices on the left is diminished, it is sufficient for healing.  I have  recommended that he resume Norvasc, which has been previously  prescribed.  He will start back on 5 mg daily, and was given a  prescription for this.  He should continue antiplatelet therapy with  aspirin.  He is smoking again, and I reviewed in detail with him the  importance of smoking cessation and the risk of future amputation with  continued cigarette smoking.   He is scheduled to follow up with Dr. Samule Ohm on June 30, 2006.  Because  of his significant pain, he was given a prescription for Percocet  5/325  mg every 4 to 6 hours as needed, number 20.  He will follow up with Dr.  Samule Ohm as scheduled.  If he develops increasing pain or involvement of  more digits, he was advised to contact us immediately.     David David. David Seltzer, MD  Electronically Signed    MDC/MedQ  DD: 06/11/2006  DT: 06/11/2006  Job #: [id number]   cc:   Titus Dubin. Alwyn Ren, MD,FACP,FCCP  Salvadore Farber, MD

## 2010-08-03 NOTE — Discharge Summary (Signed)
NAME:  David David, David David NO.:  [id number]   MEDICAL RECORD NO.:  [id number]          PATIENT TYPE:  INP   LOCATION:  5531                         FACILITY:  [facility]   PHYSICIAN:  Valerie A. Felicity Coyer, MDDATE OF BIRTH:  [birth year]   DATE OF ADMISSION:  07/01/2006  DATE OF DISCHARGE:  07/03/2006                               DISCHARGE SUMMARY   .   DISCHARGE DIAGNOSES:  1. Status post transient right eye blindness with recent presyncopal      event.  2. History of cholesterol emboli to left fifth toe.  3. Depression.  4. Smoking history.  5. Gastroesophageal reflux disease.   HISTORY OF PRESENT ILLNESS:  David David is a 60 year old white male who  presented to the emergency department after experiencing a 45-second  period of transient right eye blindness which was complete.  He had no  associated symptoms of headache, chest pain or shortness of breath at  that time and the transient blindness resolved on its own.  He also has  a history of cholesterol emboli to the left fifth toe and felt that his  toe is looking worse.  This has been seen by Dr. Samule Ohm in the past.  He  was admitted for further evaluation and treatment.   COURSE OF HOSPITALIZATION.:  1. Status post transient right eye blindness.  The patient was      admitted and underwent a CT of the head which showed no acute      abnormality.  A 2-D echo was performed to rule out thrombus and it      noted left ventricular ejection fraction of 55-60% with a prominent      epicardial fat peripheral arterial disease.  No thrombus was noted.      Chest X-Ray performed on admission showed no acute disease.  An MRI      and MRA was pursued which showed no acute ischemia.  MRA was      normal.  Nonspecific white matter changes were noted per      preliminary report.  In addition carotid Dopplers were completed      which showed no ICA stenosis per preliminary report.  The patient      was maintained on telemetry  and maintained a normal sinus rhythm      with stable rate in the 70s and 80s.  He has had no further      symptoms  2. History of cholesterol emboli at the left fifth toe seen outpatient      by Dr. Samule Ohm.  The patient is scheduled to see Dr. Samule Ohm as an      outpatient again later this month.  He will be evaluated by his      partner Dr. [name] prior to discharge today   We offered to arrange an outpatient follow-up appointment with  ophthalmology in [place] however, with further discussion with the  patient's daughter she apparently works in [place] at a vision center where  there is an Clinical research associate on staff.  She states she will set follow-up  appointment with the ophthalmologist as  soon as possible at her center  and will bring MRI report with her to that visit.   MEDICATIONS:  At time of discharge.  1. Effexor 225 mg p.o. daily.  2. Wellbutrin 450 mg p.o. daily.  3. Valium 10 mg p.o. t.i.d.  4. Norvasc 5 mg p.o. daily.  5. Aspirin 81 mg p.o. daily.  6,  Multivitamin 1 tablet p.o. daily.  1. Protonix 40 mg p.o. daily.   PERTINENT LABORATORY DATA:  At time of discharge: Hemoglobin 13,  hematocrit 39.1, BUN 15, creatinine 1.04   DISPOSITION:  The patient was discharged to home.  He is instructed  follow up with Dr. Alwyn Ren on Monday April 20 at 3 o'clock p.m. He is  also instructed to follow up with Dr. Samule Ohm as scheduled and to follow  up with ophthalmology to be scheduled by his daughter as soon as  possible.  He is instructed to return to the emergency room should he  develop weakness, vision changes or fainting spells.      David David, David David      Raenette Rover. Felicity Coyer, MD  Electronically Signed    MO/MEDQ  D:  07/03/2006  T:  07/03/2006  Job:  [id number]   cc:   Titus Dubin. Alwyn Ren, MD,FACP,FCCP

## 2010-08-15 ENCOUNTER — Encounter: Payer: Self-pay | Admitting: Cardiovascular Disease

## 2010-08-29 ENCOUNTER — Ambulatory Visit: Payer: Medicare Other | Admitting: Cardiovascular Disease

## 2010-08-29 ENCOUNTER — Telehealth: Payer: Self-pay | Admitting: Cardiovascular Disease

## 2010-08-29 NOTE — Telephone Encounter (Signed)
I spoke with the pt and he was scheduled to have a cyst removed from his testicle on Monday at the [place].  The pt was prepped and ready for surgery when the physician realized that the pt had been holding ASA 325mg  and Plavix for 5 days but did not have documentation that this was okay for the pt to do from a Cardiology standpoint.  The pt said he needs a note from Dr [name] in regards to holding these medications.  The pt would like note mailed to his home.

## 2010-08-29 NOTE — Telephone Encounter (Signed)
Per pt calling discuss medication

## 2010-08-30 NOTE — Telephone Encounter (Signed)
(  Late entry from 08/29/10) Per Dr [name] the pt is okay to hold ASA and Plavix for surgery. Letter created and mailed to pt per his request.

## 2010-09-11 ENCOUNTER — Other Ambulatory Visit: Payer: Self-pay | Admitting: Cardiovascular Disease

## 2010-09-11 DIAGNOSIS — I739 Peripheral vascular disease, unspecified: Secondary | ICD-10-CM

## 2010-09-12 ENCOUNTER — Encounter: Payer: Self-pay | Admitting: Cardiovascular Disease

## 2010-09-12 ENCOUNTER — Encounter (INDEPENDENT_AMBULATORY_CARE_PROVIDER_SITE_OTHER): Payer: Medicare Other | Admitting: Cardiology

## 2010-09-12 DIAGNOSIS — I739 Peripheral vascular disease, unspecified: Secondary | ICD-10-CM

## 2010-09-27 ENCOUNTER — Encounter: Payer: Self-pay | Admitting: Cardiovascular Disease

## 2010-10-04 ENCOUNTER — Ambulatory Visit (INDEPENDENT_AMBULATORY_CARE_PROVIDER_SITE_OTHER): Payer: Medicare Other | Admitting: Cardiovascular Disease

## 2010-10-04 ENCOUNTER — Encounter: Payer: Self-pay | Admitting: Cardiovascular Disease

## 2010-10-04 VITALS — BP 134/81 | HR 85 | Ht 76.0 in | Wt 203.0 lb

## 2010-10-04 DIAGNOSIS — I739 Peripheral vascular disease, unspecified: Secondary | ICD-10-CM

## 2010-10-04 DIAGNOSIS — F17200 Nicotine dependence, unspecified, uncomplicated: Secondary | ICD-10-CM

## 2010-10-04 NOTE — Assessment & Plan Note (Signed)
Clinically the patient is doing well. His ABIs and lower extremity duplex studies from last month were reviewed and they demonstrate normal findings bilaterally with normal ABIs and TBI's. Recommend continue current medical management which includes dual antiplatelet therapy with aspirin and Plavix. The patient was encouraged regarding his smoking cessation. He tells me he may need an upcoming surgery for a cyst in his scrotal area. It would be reasonable to discontinue aspirin Plavix for 7 days prior to surgery and it could be restarted when it is okay from a surgical perspective following the procedure. I would like to see him back in one year for followup.

## 2010-10-04 NOTE — Progress Notes (Signed)
HPI:  This is a 60 year old gentleman presenting for followup evaluation. The patient has lower extremity peripheral arterial disease and has undergone stenting of the left tibial peroneal trunk. He presented again last year with recurrent ischemic event to his left foot suggestive of embolism. Repeat angiography showed high-grade stenosis in a stented segment with question of thrombus in that area. He underwent repeat PTCA with a good result. He said no further problems. He denies calf claudication. He denies foot pain or rash. The patient denies chest pain or dyspnea. He doesn't smoke cigarettes anymore.  Outpatient Encounter Prescriptions as of 10/04/2010  Medication Sig Dispense Refill  . acetaminophen (TYLENOL) 500 MG tablet Take 500 mg by mouth every 6 (six) hours as needed.        Marland Kitchen amLODipine (NORVASC) 10 MG tablet Take 5 mg by mouth daily.        Marland Kitchen aspirin 81 MG tablet Take 81 mg by mouth daily.        . citalopram (CELEXA) 10 MG tablet Take 10 mg by mouth daily.        . clopidogrel (PLAVIX) 75 MG tablet Take 75 mg by mouth daily.        . Flaxseed, Linseed, (FLAX SEED OIL) 1000 MG CAPS Take by mouth 3 (three) times daily.        . folic acid (FOLVITE) 1 MG tablet Take 1 mg by mouth daily.        Marland Kitchen gabapentin (NEURONTIN) 600 MG tablet Take 600 mg by mouth 3 (three) times daily.        . hydrOXYzine (ATARAX) 50 MG tablet Take 50 mg by mouth 4 (four) times daily.        . methotrexate (RHEUMATREX) 2.5 MG tablet Take 2.5 mg by mouth once a week. Caution:Chemotherapy. Protect from light. 5 tablets once a week on Friday       . mirtazapine (REMERON) 15 MG tablet Take 15 mg by mouth at bedtime.        . multivitamin (THERAGRAN) per tablet Take 1 tablet by mouth daily.        . pantoprazole (PROTONIX) 40 MG tablet Take 40 mg by mouth daily.        . pravastatin (PRAVACHOL) 40 MG tablet Take 40 mg by mouth at bedtime.        . Thiamine HCl (VITAMIN B-1) 250 MG tablet Take 250 mg by mouth daily.         . vitamin E 600 UNIT capsule Take 600 Units by mouth daily.        Marland Kitchen DISCONTD: cyclobenzaprine (FLEXERIL) 10 MG tablet Take 10 mg by mouth 3 (three) times daily as needed.        Marland Kitchen DISCONTD: omeprazole (PRILOSEC OTC) 20 MG tablet Take 20 mg by mouth 2 (two) times daily.        Marland Kitchen DISCONTD: prazosin (MINIPRESS) 2 MG capsule Take 2 mg by mouth at bedtime. 2 caps full at bedtime         Allergies  Allergen Reactions  . Niacin   . Paroxetine     REACTION: goofy  . Triple Antibiotic     Past Medical History  Diagnosis Date  . Peripheral vascular disease, unspecified     lower extremity peripheral artery disease  . Unspecified hypertensive kidney disease with chronic kidney disease stage I through stage IV, or unspecified     cholesterol embolization syndrome  . GERD (gastroesophageal reflux disease)   . Exposure  to STD   . Dysfunction of eustachian tube   . Routine general medical examination at a health care facility   . Family history of psychiatric condition     family Hx of suicide   . Alcoholism in family   . Family history of diabetes mellitus   . Lipoprotein deficiencies   . Impotence of organic origin     erectile dysfunction  . Other diseases of lung, not elsewhere classified     pulmonary nodule  . Sprain of neck     Hx  . Diverticulosis of colon (without mention of hemorrhage)   . Depressive disorder, not elsewhere classified   . Personal history of colonic polyps   . Tobacco use disorder     ROS: Negative except as per HPI  BP 134/81  Pulse 85  Ht 6\' 4"  (1.93 m)  Wt 203 lb (92.08 kg)  BMI 24.71 kg/m2  PHYSICAL EXAM: Pt is alert and oriented, NAD HEENT: normal Neck: JVP - normal, carotids 2+= without bruits Lungs: CTA bilaterally CV: RRR without murmur or gallop Abd: soft, NT, Positive BS, no hepatomegaly Ext: no C/C/E, distal pulses intact and equal Skin: warm/dry no rash  EKG:  Normal sinus rhythm with nonspecific T-wave abnormality, heart rate  85 beats per minute  ASSESSMENT AND PLAN:

## 2010-10-04 NOTE — Patient Instructions (Signed)
Your physician wants you to follow-up in: 12 months. You will receive a reminder letter in the mail two months in advance. If you don't receive a letter, please call our office to schedule the follow-up appointment.  Per Dr. [name] it is OK to hold Plavix and Aspirin for 7 days prior to surgery.

## 2010-10-04 NOTE — Assessment & Plan Note (Signed)
The patient has successfully quit smoking 

## 2010-11-29 ENCOUNTER — Telehealth: Payer: Self-pay | Admitting: *Deleted

## 2010-11-29 NOTE — Telephone Encounter (Signed)
[place]  Message left for him to call and schedule colon.

## 2011-01-02 LAB — BASIC METABOLIC PANEL
BUN: 11
BUN: 9
CO2: 24
Calcium: 9.6
Chloride: 111
Chloride: 106
Creatinine, Ser: 1.15
Creatinine, Ser: 1.27
GFR calc Af Amer: >60
GFR calc non Af Amer: 58 — ABNORMAL LOW
Glucose, Bld: 102 — ABNORMAL HIGH

## 2011-01-02 LAB — RAPID URINE DRUG SCREEN, HOSP PERFORMED
Amphetamines: NOT DETECTED
Barbiturates: NOT DETECTED
Opiates: NOT DETECTED
Tetrahydrocannabinol: NOT DETECTED

## 2011-01-02 LAB — ETHANOL: Alcohol, Ethyl (B): <5

## 2011-03-20 ENCOUNTER — Other Ambulatory Visit: Payer: Self-pay | Admitting: *Deleted

## 2011-03-20 DIAGNOSIS — I6529 Occlusion and stenosis of unspecified carotid artery: Secondary | ICD-10-CM

## 2011-03-25 ENCOUNTER — Other Ambulatory Visit: Payer: Self-pay | Admitting: *Deleted

## 2011-03-25 ENCOUNTER — Encounter: Payer: Medicare Other | Admitting: *Deleted

## 2011-03-25 ENCOUNTER — Encounter (INDEPENDENT_AMBULATORY_CARE_PROVIDER_SITE_OTHER): Payer: Medicare Other | Admitting: *Deleted

## 2011-03-25 DIAGNOSIS — I739 Peripheral vascular disease, unspecified: Secondary | ICD-10-CM

## 2011-03-25 DIAGNOSIS — I70219 Atherosclerosis of native arteries of extremities with intermittent claudication, unspecified extremity: Secondary | ICD-10-CM

## 2011-05-15 ENCOUNTER — Telehealth: Payer: Self-pay | Admitting: Cardiovascular Disease

## 2011-05-15 ENCOUNTER — Telehealth: Payer: Self-pay | Admitting: *Deleted

## 2011-05-15 NOTE — Telephone Encounter (Signed)
New Problem   Patient returning nurses call, he can be reached at [phone].

## 2011-05-15 NOTE — Telephone Encounter (Signed)
Not able to find a message where anyone in our clinic called Mr Jewkes but it looks like he was called by the gastroenterology dept.  He was given that phone number to call.

## 2011-05-15 NOTE — Telephone Encounter (Signed)
Called pt 11/2010 , LM for pt to call back regarding scheduling next colonoscopy, recall project. Called today 05-15-2011 and left a message on the home # [phone], cell # [phone] for the pt to call us back to schedule his next colonscopy.  Left info on message that his last colonoscopy was 08-2004.

## 2011-05-17 NOTE — Telephone Encounter (Signed)
The patient is now seeing the [facility] for his GI needs.  He has had a colonoscopy there.  I asked if he would be expecting to see Dr. Marina Goodell in the future and he said he did not think so.

## 2011-11-01 ENCOUNTER — Other Ambulatory Visit: Payer: Self-pay | Admitting: Cardiology

## 2011-11-01 DIAGNOSIS — I739 Peripheral vascular disease, unspecified: Secondary | ICD-10-CM

## 2011-11-08 ENCOUNTER — Encounter (INDEPENDENT_AMBULATORY_CARE_PROVIDER_SITE_OTHER): Payer: Medicare Other

## 2011-11-08 ENCOUNTER — Ambulatory Visit (INDEPENDENT_AMBULATORY_CARE_PROVIDER_SITE_OTHER): Payer: Medicare Other | Admitting: Cardiovascular Disease

## 2011-11-08 ENCOUNTER — Encounter: Payer: Self-pay | Admitting: Cardiovascular Disease

## 2011-11-08 VITALS — BP 121/72 | HR 72 | Resp 18 | Ht 75.0 in | Wt 194.8 lb

## 2011-11-08 DIAGNOSIS — I739 Peripheral vascular disease, unspecified: Secondary | ICD-10-CM

## 2011-11-08 DIAGNOSIS — I70219 Atherosclerosis of native arteries of extremities with intermittent claudication, unspecified extremity: Secondary | ICD-10-CM

## 2011-11-08 NOTE — Patient Instructions (Addendum)
Your physician wants you to follow-up in: 1  YEAR with Dr [name].  You will receive a reminder letter in the mail two months in advance. If you don't receive a letter, please call our office to schedule the follow-up appointment.  Your physician has requested that you have an ankle brachial index (ABI) in 1 YEAR. During this test an ultrasound and blood pressure cuff are used to evaluate the arteries that supply the arms and legs with blood. Allow thirty minutes for this exam. There are no restrictions or special instructions.  Your physician has requested that you have a lower extremity arterial duplex in 1 YEAR. This test is an ultrasound of the arteries in the legs. It looks at arterial blood flow in the legs. Allow one hour for Lower Arterial scans. There are no restrictions or special instructions  Your physician recommends that you continue on your current medications as directed. Please refer to the Current Medication list given to you today.

## 2011-11-08 NOTE — Assessment & Plan Note (Addendum)
The patient is stable without claudication symptoms. He has no symptoms of cardiopulmonary problems. He's on a good medical program which includes aspirin, Plavix, and a statin drug. His lipids are followed through the [facility] system. I discussed consideration of the [place] trial which randomized into either Plavix or brilinta without aspirin. I think this would be a reasonable study for him considering his PAD. I would like to see him back in one year for followup with ABIs and a duplex scan before his next visit.

## 2011-11-08 NOTE — Progress Notes (Signed)
HPI:  61 year old gentleman presenting for followup evaluation. The patient is followed for lower extremity peripheral arterial disease. He's undergone remote stenting of his left tibial peroneal trunk. He presented again in 2011 required angioplasty at that same site after he developed an embolic event the left foot. He had followup ABIs today that were 1.1 on the right and 0.98 on the left. The left popliteal artery stent is patent with stable velocities.   He has no chest pain, dyspnea, chest pressure, claudication symptoms, lightheadedness, or palpitations. He's had no bleeding problems. He has been taking a full aspirin as well as Plavix and wonder if he needs to keep doing this. He is been able to remain off of cigarettes now for well over one year. He's going down to the beach later this week and this can be doing some flounder fishing.  Outpatient Encounter Prescriptions as of 11/08/2011  Medication Sig Dispense Refill  . amLODipine (NORVASC) 10 MG tablet Take 5 mg by mouth daily.        Marland Kitchen aspirin 81 MG tablet Take 325 mg by mouth daily.       . citalopram (CELEXA) 10 MG tablet Take 10 mg by mouth daily.        . clopidogrel (PLAVIX) 75 MG tablet Take 75 mg by mouth daily.        . folic acid (FOLVITE) 1 MG tablet Take 1 mg by mouth daily.       Marland Kitchen gabapentin (NEURONTIN) 600 MG tablet Take 600 mg by mouth 3 (three) times daily.        . hydrOXYzine (ATARAX) 50 MG tablet Take 50 mg by mouth 4 (four) times daily.        . methotrexate (RHEUMATREX) 2.5 MG tablet Take 2.5 mg by mouth once a week. Caution:Chemotherapy. Protect from light. 5 tablets once a week on Friday       . mirtazapine (REMERON) 15 MG tablet Take 15 mg by mouth at bedtime.        . multivitamin (THERAGRAN) per tablet Take 1 tablet by mouth daily.        . Omega-3 Krill Oil 500 MG CAPS Take 1 capsule by mouth daily.      . pantoprazole (PROTONIX) 40 MG tablet Take 40 mg by mouth daily.        . pravastatin (PRAVACHOL) 40  MG tablet Take 40 mg by mouth at bedtime.        . Thiamine HCl (VITAMIN B-1) 250 MG tablet Take 250 mg by mouth daily.        . vitamin E 600 UNIT capsule Take 600 Units by mouth daily.        Marland Kitchen acetaminophen (TYLENOL) 500 MG tablet Take 500 mg by mouth every 6 (six) hours as needed.        Marland Kitchen DISCONTD: Flaxseed, Linseed, (FLAX SEED OIL) 1000 MG CAPS Take by mouth 3 (three) times daily.          Allergies  Allergen Reactions  . Neomycin-Bacitracin Zn-Polymyx   . Niacin   . Paroxetine     REACTION: goofy    Past Medical History  Diagnosis Date  . Peripheral vascular disease, unspecified     lower extremity peripheral artery disease  . Unspecified hypertensive kidney disease with chronic kidney disease stage I through stage IV, or unspecified     cholesterol embolization syndrome  . GERD (gastroesophageal reflux disease)   . Exposure to STD   . Dysfunction  of eustachian tube   . Routine general medical examination at a health care facility   . Family history of psychiatric condition     family Hx of suicide   . Alcoholism in family   . Family history of diabetes mellitus   . Lipoprotein deficiencies   . Impotence of organic origin     erectile dysfunction  . Other diseases of lung, not elsewhere classified     pulmonary nodule  . Sprain of neck     Hx  . Diverticulosis of colon (without mention of hemorrhage)   . Depressive disorder, not elsewhere classified   . Personal history of colonic polyps   . Tobacco use disorder     ROS: Negative except as per HPI  BP 121/72  Pulse 72  Resp 18  Ht 6\' 3"  (1.905 m)  Wt 88.361 kg (194 lb 12.8 oz)  BMI 24.35 kg/m2  SpO2 98%  PHYSICAL EXAM: Pt is alert and oriented, NAD HEENT: normal Neck: JVP - normal, carotids 2+= without bruits Lungs: CTA bilaterally CV: RRR without murmur or gallop Abd: soft, NT, Positive BS, no hepatomegaly Ext: no C/C/E, distal pulses intact and equal Skin: warm/dry no rash  EKG:  Normal sinus  rhythm 70 beats per minute, nonspecific T wave abnormality.  ABIs: 1.1 on the right and 0.98 on the left.  ASSESSMENT AND PLAN:

## 2012-10-07 ENCOUNTER — Telehealth: Payer: Self-pay | Admitting: Cardiovascular Disease

## 2012-10-07 DIAGNOSIS — I739 Peripheral vascular disease, unspecified: Secondary | ICD-10-CM

## 2012-10-07 NOTE — Telephone Encounter (Signed)
I spoke with the pt and he noticed a bruise on his left great toe last night.  The pt does not remember hitting his toe.  The pt does not have an open area or sore to this area and denies pain.  The pt is scheduled to see Dr [name] in August with a LEA and ABI prior to appointment.  I have scheduled the pt for a LEA with ABI to be performed tomorrow to assess blood flow.  Pt agreed with plan.

## 2012-10-07 NOTE — Telephone Encounter (Signed)
°  New Prob      Pt would like to speak to nurse regarding some discoloration on his toe. Please call.

## 2012-10-07 NOTE — Telephone Encounter (Signed)
Left message on machine for pt to contact the office.   

## 2012-10-08 ENCOUNTER — Encounter (INDEPENDENT_AMBULATORY_CARE_PROVIDER_SITE_OTHER): Payer: Medicare Other

## 2012-10-08 DIAGNOSIS — I739 Peripheral vascular disease, unspecified: Secondary | ICD-10-CM

## 2012-10-09 ENCOUNTER — Encounter: Payer: Self-pay | Admitting: Cardiovascular Disease

## 2012-10-09 ENCOUNTER — Ambulatory Visit (INDEPENDENT_AMBULATORY_CARE_PROVIDER_SITE_OTHER): Payer: Medicare Other | Admitting: Cardiovascular Disease

## 2012-10-09 VITALS — BP 132/82 | HR 74 | Ht 75.0 in | Wt 192.0 lb

## 2012-10-09 DIAGNOSIS — I739 Peripheral vascular disease, unspecified: Secondary | ICD-10-CM

## 2012-10-09 DIAGNOSIS — I75029 Atheroembolism of unspecified lower extremity: Secondary | ICD-10-CM

## 2012-10-09 LAB — BASIC METABOLIC PANEL
CO2: 27 mEq/L (ref 19–32)
Calcium: 9.3 mg/dL (ref 8.4–10.5)
Creatinine, Ser: 1 mg/dL (ref 0.4–1.5)
GFR: 76.5 mL/min (ref >60.00)
Glucose, Bld: 81 mg/dL (ref 70–99)
Sodium: 138 mEq/L (ref 135–145)

## 2012-10-09 MED ORDER — TICAGRELOR 90 MG PO TABS: 90.0000 mg | ORAL_TABLET | Freq: Two times a day (BID) | ORAL | Status: DC

## 2012-10-09 MED ORDER — ASPIRIN EC 81 MG PO TBEC: 81.0000 mg | DELAYED_RELEASE_TABLET | Freq: Every day | ORAL | Status: AC

## 2012-10-09 NOTE — Patient Instructions (Addendum)
Your physician has recommended you make the following change in your medication:   STOP PLAVIX START BRILLINTA 90 MG TWICE DAILY 12 HOURS APART SAMPLES GIVEN LOT [id number] EXP 08/2014 32 TABLETS PLUS A CARD START ASPIRIN 81 MG DAILY  Your physician recommends that you return for lab work in: TODAY BMET  CT A ABDOMEN AND PELVIS WITH RUN OFF CT CHEST  Non-Cardiac CT Angiography (CTA), is a special type of CT scan that uses a computer to produce multi-dimensional views of major blood vessels throughout the body. In CT angiography, a contrast material is injected through an IV to help visualize the blood vessels  Your physician has requested that you have an echocardiogram. Echocardiography is a painless test that uses sound waves to create images of your heart. It provides your doctor with information about the size and shape of your heart and how well your heart's chambers and valves are working. This procedure takes approximately one hour. There are no restrictions for this procedure.  Your physician recommends that you schedule a follow-up appointment in: 2 WEEKS, YOU MAY DOUBLE BOOK DR COOPER.

## 2012-10-09 NOTE — Progress Notes (Signed)
HPI:  62 year-old gentleman presenting for follow-up evaluation. He has lower extremity PAD with history of atheroembolic disease. He has undergone stenting and PTA of the left tibioperoneal trunk, last in 2011. The patient called in with recurrence of discoloration of his left foot. This has not occurred in a few years, but about 3-4 days ago he developed a purple/blue discoloration of the left big toe and medial foot. This is painless without itching, coldness, or numbness. There is no weakness of the foot or other related complaints. Denies trauma to the foot. No right foot symptoms. No calf claudication symptoms. No palpitations, lightheadedness, or chest pain. No fever/chills, or constitutional symptoms. He continues to abstain form smoking.  Outpatient Encounter Prescriptions as of 10/09/2012  Medication Sig Dispense Refill  . acetaminophen (TYLENOL) 500 MG tablet Take 500 mg by mouth every 6 (six) hours as needed.        Marland Kitchen amLODipine (NORVASC) 10 MG tablet Take 5 mg by mouth daily.        . cholecalciferol (VITAMIN D) 1000 UNITS tablet Take 1,000 Units by mouth daily.      . citalopram (CELEXA) 10 MG tablet Take 10 mg by mouth daily.        . clopidogrel (PLAVIX) 75 MG tablet Take 75 mg by mouth daily.        . folic acid (FOLVITE) 1 MG tablet Take 1 mg by mouth daily.       Marland Kitchen gabapentin (NEURONTIN) 600 MG tablet Take 600 mg by mouth 3 (three) times daily.        . hydrOXYzine (ATARAX) 50 MG tablet Take 50 mg by mouth 4 (four) times daily.        . methotrexate (RHEUMATREX) 2.5 MG tablet Take 2.5 mg by mouth once a week. Caution:Chemotherapy. Protect from light. 5 tablets once a week on Friday       . mirtazapine (REMERON) 15 MG tablet Take 15 mg by mouth at bedtime.        . multivitamin (THERAGRAN) per tablet Take 1 tablet by mouth daily.        . Omega-3 Krill Oil 500 MG CAPS Take 1 capsule by mouth daily.      . pantoprazole (PROTONIX) 40 MG tablet Take 40 mg by mouth daily.        .  pravastatin (PRAVACHOL) 40 MG tablet Take 40 mg by mouth at bedtime.        . Thiamine HCl (VITAMIN B-1) 250 MG tablet Take 250 mg by mouth daily.        . vitamin E 600 UNIT capsule Take 600 Units by mouth daily.        . [DISCONTINUED] aspirin 81 MG tablet Take 325 mg by mouth daily.        No facility-administered encounter medications on file as of 10/09/2012.    Allergies  Allergen Reactions  . Neomycin-Bacitracin Zn-Polymyx   . Niacin   . Paroxetine     REACTION: goofy    Past Medical History  Diagnosis Date  . Peripheral vascular disease, unspecified     lower extremity peripheral artery disease  . Unspecified hypertensive kidney disease with chronic kidney disease stage I through stage IV, or unspecified(403.90)     cholesterol embolization syndrome  . GERD (gastroesophageal reflux disease)   . Exposure to STD   . Dysfunction of eustachian tube   . Routine general medical examination at a health care facility   . Family history  of psychiatric condition     family Hx of suicide   . Alcoholism in family   . Family history of diabetes mellitus   . Lipoprotein deficiencies   . Impotence of organic origin     erectile dysfunction  . Other diseases of lung, not elsewhere classified     pulmonary nodule  . Sprain of neck     Hx  . Diverticulosis of colon (without mention of hemorrhage)   . Depressive disorder, not elsewhere classified   . Personal history of colonic polyps   . Tobacco use disorder     ROS: Negative except as per HPI  BP 132/82  Pulse 74  Ht 6\' 3"  (1.905 m)  Wt 87.091 kg (192 lb)  BMI 24 kg/m2  PHYSICAL EXAM: Pt is alert and oriented, NAD HEENT: normal Neck: JVP - normal, carotids 2+= without bruits Lungs: CTA bilaterally CV: RRR without murmur or gallop Abd: soft, NT, Positive BS, no hepatomegaly Ext: no C/C/E, DP/PT pulses are 1+ Skin: right foot clear, left foot with purpuric rash great toe and medial aspect of the plantar aspect of the  foot  EKG:  Sinus rhythm with occasional PVC, otherwise normal  Lower extremity duplex: ABI 1.1 on the right and 0.89 on the left. Duplex shows patent tibial stent on the left, no occlusive disease noted, minimal flow to the left great toe.  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 Unclear etiology, but suspect emboli from athero in left leg arterial system since always recurs in the left foot and previous stent on the left. However, stent appears widely patent by duplex. Will check an echo to evaluate for cardiac source of embolus and a CTA of the chest, abdomen, and pelvis to evaluate for arterial disease, aneurysm, source of embolus. Also will change antiplatelet regimen since this has recurred on background of plavix. Recommend start ASA 81 mg daily and brilinta 90 mg BID. Will recheck in 2 weeks.  Tonny Bollman 10/12/2012 6:32 AM

## 2012-10-15 ENCOUNTER — Ambulatory Visit
Admission: RE | Admit: 2012-10-15 | Discharge: 2012-10-15 | Disposition: A | Discharge status: Home / Self Care | Payer: Medicare Other | Source: Ambulatory Visit | Attending: Cardiovascular Disease | Admitting: Cardiovascular Disease

## 2012-10-15 ENCOUNTER — Ambulatory Visit (INDEPENDENT_AMBULATORY_CARE_PROVIDER_SITE_OTHER)
Admission: RE | Admit: 2012-10-15 | Discharge: 2012-10-15 | Disposition: A | Discharge status: Home / Self Care | Payer: Medicare Other | Source: Ambulatory Visit | Attending: Cardiovascular Disease | Admitting: Cardiovascular Disease

## 2012-10-15 DIAGNOSIS — I75029 Atheroembolism of unspecified lower extremity: Secondary | ICD-10-CM

## 2012-10-15 DIAGNOSIS — I739 Peripheral vascular disease, unspecified: Secondary | ICD-10-CM

## 2012-10-15 MED ORDER — IOHEXOL 350 MG/ML SOLN
80.0000 mL | Freq: Once | INTRAVENOUS | Status: AC | PRN
  Administered 2012-10-15: 80 mL via INTRAVENOUS

## 2012-10-16 ENCOUNTER — Ambulatory Visit (HOSPITAL_COMMUNITY): Payer: Medicare Other | Attending: Cardiology | Admitting: Radiology

## 2012-10-16 ENCOUNTER — Ambulatory Visit (INDEPENDENT_AMBULATORY_CARE_PROVIDER_SITE_OTHER)
Admission: RE | Admit: 2012-10-16 | Discharge: 2012-10-16 | Disposition: A | Discharge status: Home / Self Care | Payer: Medicare Other | Source: Ambulatory Visit | Attending: Cardiovascular Disease | Admitting: Cardiovascular Disease

## 2012-10-16 DIAGNOSIS — I75029 Atheroembolism of unspecified lower extremity: Secondary | ICD-10-CM

## 2012-10-16 DIAGNOSIS — I739 Peripheral vascular disease, unspecified: Secondary | ICD-10-CM | POA: Insufficient documentation

## 2012-10-16 DIAGNOSIS — I743 Embolism and thrombosis of arteries of the lower extremities: Secondary | ICD-10-CM | POA: Insufficient documentation

## 2012-10-16 DIAGNOSIS — F17200 Nicotine dependence, unspecified, uncomplicated: Secondary | ICD-10-CM | POA: Insufficient documentation

## 2012-10-16 MED ORDER — IOHEXOL 350 MG/ML SOLN
150.0000 mL | Freq: Once | INTRAVENOUS | Status: AC | PRN
  Administered 2012-10-16: 120 mL via INTRAVENOUS

## 2012-10-16 NOTE — Progress Notes (Signed)
Echocardiogram performed.  

## 2012-10-21 ENCOUNTER — Encounter: Payer: Self-pay | Admitting: Cardiovascular Disease

## 2012-10-21 ENCOUNTER — Ambulatory Visit (INDEPENDENT_AMBULATORY_CARE_PROVIDER_SITE_OTHER): Payer: Medicare Other | Admitting: Cardiovascular Disease

## 2012-10-21 VITALS — BP 130/80 | HR 62 | Ht 76.0 in | Wt 190.0 lb

## 2012-10-21 DIAGNOSIS — I70229 Atherosclerosis of native arteries of extremities with rest pain, unspecified extremity: Secondary | ICD-10-CM

## 2012-10-21 NOTE — Progress Notes (Signed)
HPI:  62 year old presenting for followup evaluation. The patient has lower extremity peripheral arterial disease with recurrent atheroembolic events to the left foot. He did well after his last intervention of in-stent restenosis in the left tibioperoneal trunk. However, he recently returned with a painless rash involving the left great toe and medial foot, with typical appearance of an athero embolic event. I saw him a few weeks ago and ordered an echocardiogram and a CTA of the chest abdomen and pelvis with runoff to the feet. This demonstrated scattered atheromatous the aorta but no significant findings. The echocardiogram was within normal limits. The left tibioperoneal stent had greater than 70% stenosis. He presents today for followup.  The patient reports no change in symptoms. His rash is unchanged. He denies pain or weakness of the left foot. He does have numbness of both feet. This is unchanged over time. He's had no chest pain, dyspnea, or other complaints.  Outpatient Encounter Prescriptions as of 10/21/2012  Medication Sig Dispense Refill  . acetaminophen (TYLENOL) 500 MG tablet Take 500 mg by mouth every 6 (six) hours as needed.        Marland Kitchen amLODipine (NORVASC) 10 MG tablet Take 5 mg by mouth daily.        Marland Kitchen aspirin EC 81 MG tablet Take 1 tablet (81 mg total) by mouth daily.  90 tablet  3  . cholecalciferol (VITAMIN D) 1000 UNITS tablet Take 1,000 Units by mouth daily.      . citalopram (CELEXA) 10 MG tablet Take 10 mg by mouth daily.        . folic acid (FOLVITE) 1 MG tablet Take 1 mg by mouth daily.       Marland Kitchen gabapentin (NEURONTIN) 600 MG tablet Take 600 mg by mouth 3 (three) times daily.        . hydrOXYzine (ATARAX) 50 MG tablet Take 50 mg by mouth 4 (four) times daily.        . methotrexate (RHEUMATREX) 2.5 MG tablet Take 2.5 mg by mouth once a week. Caution:Chemotherapy. Protect from light. 5 tablets once a week on Friday       . mirtazapine (REMERON) 15 MG tablet Take 15 mg by  mouth at bedtime.        . multivitamin (THERAGRAN) per tablet Take 1 tablet by mouth daily.        . Omega-3 Krill Oil 500 MG CAPS Take 1 capsule by mouth daily.      . pantoprazole (PROTONIX) 40 MG tablet Take 40 mg by mouth daily.        . pravastatin (PRAVACHOL) 40 MG tablet Take 40 mg by mouth at bedtime.        . Thiamine HCl (VITAMIN B-1) 250 MG tablet Take 250 mg by mouth daily.        . Ticagrelor (BRILINTA) 90 MG TABS tablet Take 90 mg by mouth daily.      . vitamin E 600 UNIT capsule Take 600 Units by mouth daily.        . [DISCONTINUED] Ticagrelor (BRILINTA) 90 MG TABS tablet Take 1 tablet (90 mg total) by mouth 2 (two) times daily.  60 tablet  6   No facility-administered encounter medications on file as of 10/21/2012.    Allergies  Allergen Reactions  . Neomycin-Bacitracin Zn-Polymyx   . Niacin   . Paroxetine     REACTION: goofy    Past Medical History  Diagnosis Date  . Peripheral vascular disease, unspecified  lower extremity peripheral artery disease  . Unspecified hypertensive kidney disease with chronic kidney disease stage I through stage IV, or unspecified(403.90)     cholesterol embolization syndrome  . GERD (gastroesophageal reflux disease)   . Exposure to STD   . Dysfunction of eustachian tube   . Routine general medical examination at a health care facility   . Family history of psychiatric condition     family Hx of suicide   . Alcoholism in family   . Family history of diabetes mellitus   . Lipoprotein deficiencies   . Impotence of organic origin     erectile dysfunction  . Other diseases of lung, not elsewhere classified     pulmonary nodule  . Sprain of neck     Hx  . Diverticulosis of colon (without mention of hemorrhage)   . Depressive disorder, not elsewhere classified   . Personal history of colonic polyps   . Tobacco use disorder     ROS: Negative except as per HPI  BP 130/80  Pulse 62  Ht 6\' 4"  (1.93 m)  Wt 86.183 kg (190 lb)   BMI 23.14 kg/m2  PHYSICAL EXAM: Pt is alert and oriented, NAD HEENT: normal Neck: JVP - normal, carotids 2+= without bruits Lungs: CTA bilaterally CV: RRR without murmur or gallop Abd: soft, NT, Positive BS, no hepatomegaly Ext: no C/C/E, distal pulses intact and equal Skin: warm/dry no rash  2D Echo: Left ventricle: The cavity size was normal. Wall thickness was normal. Systolic function was normal. The estimated ejection fraction was in the range of 55% to 60%. Wall motion was normal; there were no regional wall motion abnormalities. The transmitral flow pattern was normal. The deceleration time of the early transmitral flow velocity was normal. The pulmonary vein flow pattern was normal. The tissue Doppler parameters were normal. Left ventricular diastolic function parameters were normal.  ------------------------------------------------------------ Aortic valve: Structurally normal valve. Cusp separation was normal. Doppler: Transvalvular velocity was within the normal range. There was no stenosis. No regurgitation.  ------------------------------------------------------------ Aorta: The aorta was normal, not dilated, and non-diseased.  ------------------------------------------------------------ Mitral valve: Structurally normal valve. Leaflet separation was normal. Doppler: Transvalvular velocity was within the normal range. There was no evidence for stenosis. No regurgitation. Peak gradient: 3mm Hg (D).  ------------------------------------------------------------ Left atrium: The atrium was at the upper limits of normal in size.  ------------------------------------------------------------ Right ventricle: The cavity size was normal. Wall thickness was normal. Systolic function was normal.  ------------------------------------------------------------ Pulmonic valve: Structurally normal valve. Cusp separation was normal. Doppler: Transvalvular velocity  was within the normal range. No regurgitation.  ------------------------------------------------------------ Tricuspid valve: Structurally normal valve. Leaflet separation was normal. Doppler: Transvalvular velocity was within the normal range. Mild regurgitation.  ------------------------------------------------------------ Pulmonary artery: The main pulmonary artery was normal-sized. Systolic pressure was within the normal range.  ------------------------------------------------------------ Right atrium: The atrium was normal in size.  ------------------------------------------------------------ Pericardium: The pericardium was normal in appearance.  ------------------------------------------------------------ Systemic veins: Inferior vena cava: The vessel was normal in size; the respirophasic diameter changes were in the normal range (= 50%); findings are consistent with normal central venous pressure.  ------------------------------------------------------------ Post procedure conclusions Ascending Aorta:  - The aorta was normal, not dilated, and non-diseased.   CTA Chest: IMPRESSION:  1. Negative for pulmonary embolism.  2. Paraseptal and centrilobular emphysema, progressive since 2008.  3. Several pulmonary nodules are stable for greater than 2 years,  and can be considered benign.  4. Coronary artery atherosclerotic calcification.  CTA: IMPRESSION:  1. Scattered  minimal atherosclerotic plaque within a normal  caliber abdominal aorta.  2. There has been apparent collapse of the arterial stent within  the distal aspect of the left below-knee popliteal artery. While  the stent remains patent, it is narrowed in the AP direction  resulting in likely >70% luminal narrowing. Patent three-vessel  runoff to the left foot without definitive evidence of distal  embolism.  3. No evidence of hemodynamically significant narrowing within the  right lower extremity.  4.  Grossly unchanged shoddy porta hepatis and retroperitoneal  lymph nodes within the upper abdomen, stable since the 2008  examination and thus favored to be of a benign etiology, likely  reactive.  5. Incidentally noted bilateral L5 pars defects with associated  minimal (grade 1) anterolisthesis of L5 upon S1  ASSESSMENT AND PLAN: Atheroembolism, left foot. The patient has recurrent atheroembolism. I have personally reviewed his CT angiography images as well as his echocardiogram. There is no clear source of atheroembolism other than his stent in the left tibioperoneal trunk. The CT angiogram demonstrates restenosis of the stent. In the past this is been associated with emboli to his left foot. I have changed his antiplatelet therapy to a more potent agent with brilinta. I have recommended that he undergo lower sternum angiography with consideration of PTA and stenting of the left leg. I have reviewed his case with Dr. Kirke Corin. He will perform the procedure. The patient will continue his current medical program. I have reviewed the risks, indications, and alternatives with the patient. Specific risks include vascular injury, bleeding, embolism, and emergency surgery. The patient understands and agrees to proceed  David David 10/23/2012 3:20 PM

## 2012-10-21 NOTE — Patient Instructions (Signed)
Your physician has requested that you have a peripheral vascular angiogram. This exam is performed at the hospital. During this exam IV contrast is used to look at arterial blood flow. Please review the information sheet given for details.  Your physician recommends that you continue on your current medications as directed. Please refer to the Current Medication list given to you today.  

## 2012-10-22 ENCOUNTER — Encounter: Payer: Self-pay | Admitting: Cardiovascular Disease

## 2012-10-23 ENCOUNTER — Encounter (HOSPITAL_COMMUNITY): Payer: Self-pay | Admitting: Pharmacy Technician

## 2012-10-28 ENCOUNTER — Other Ambulatory Visit: Payer: Self-pay

## 2012-10-29 ENCOUNTER — Other Ambulatory Visit: Payer: Self-pay

## 2012-10-30 ENCOUNTER — Other Ambulatory Visit: Payer: Self-pay | Admitting: Cardiovascular Disease

## 2012-11-02 ENCOUNTER — Ambulatory Visit: Payer: Medicare Other | Admitting: Cardiovascular Disease

## 2012-11-04 ENCOUNTER — Encounter (HOSPITAL_COMMUNITY)
Admission: RE | Disposition: A | Discharge status: Home / Self Care | Payer: Self-pay | Source: Ambulatory Visit | Attending: Cardiovascular Disease

## 2012-11-04 ENCOUNTER — Other Ambulatory Visit: Payer: Self-pay | Admitting: Cardiovascular Disease

## 2012-11-04 ENCOUNTER — Ambulatory Visit (HOSPITAL_COMMUNITY)
Admission: RE | Admit: 2012-11-04 | Discharge: 2012-11-04 | Disposition: A | Discharge status: Home / Self Care | Payer: Medicare Other | Source: Ambulatory Visit | Attending: Cardiovascular Disease | Admitting: Cardiovascular Disease

## 2012-11-04 DIAGNOSIS — Z79899 Other long term (current) drug therapy: Secondary | ICD-10-CM | POA: Insufficient documentation

## 2012-11-04 DIAGNOSIS — Q762 Congenital spondylolisthesis: Secondary | ICD-10-CM | POA: Insufficient documentation

## 2012-11-04 DIAGNOSIS — F329 Major depressive disorder, single episode, unspecified: Secondary | ICD-10-CM | POA: Insufficient documentation

## 2012-11-04 DIAGNOSIS — T82898A Other specified complication of vascular prosthetic devices, implants and grafts, initial encounter: Secondary | ICD-10-CM | POA: Insufficient documentation

## 2012-11-04 DIAGNOSIS — I70209 Unspecified atherosclerosis of native arteries of extremities, unspecified extremity: Secondary | ICD-10-CM | POA: Insufficient documentation

## 2012-11-04 DIAGNOSIS — Y831 Surgical operation with implant of artificial internal device as the cause of abnormal reaction of the patient, or of later complication, without mention of misadventure at the time of the procedure: Secondary | ICD-10-CM | POA: Insufficient documentation

## 2012-11-04 DIAGNOSIS — F17200 Nicotine dependence, unspecified, uncomplicated: Secondary | ICD-10-CM | POA: Insufficient documentation

## 2012-11-04 DIAGNOSIS — I70219 Atherosclerosis of native arteries of extremities with intermittent claudication, unspecified extremity: Secondary | ICD-10-CM

## 2012-11-04 DIAGNOSIS — E786 Lipoprotein deficiency: Secondary | ICD-10-CM | POA: Insufficient documentation

## 2012-11-04 DIAGNOSIS — K5730 Diverticulosis of large intestine without perforation or abscess without bleeding: Secondary | ICD-10-CM | POA: Insufficient documentation

## 2012-11-04 DIAGNOSIS — I7589 Atheroembolism of other site: Secondary | ICD-10-CM | POA: Insufficient documentation

## 2012-11-04 DIAGNOSIS — I129 Hypertensive chronic kidney disease with stage 1 through stage 4 chronic kidney disease, or unspecified chronic kidney disease: Secondary | ICD-10-CM | POA: Insufficient documentation

## 2012-11-04 DIAGNOSIS — N529 Male erectile dysfunction, unspecified: Secondary | ICD-10-CM | POA: Insufficient documentation

## 2012-11-04 DIAGNOSIS — Z7902 Long term (current) use of antithrombotics/antiplatelets: Secondary | ICD-10-CM | POA: Insufficient documentation

## 2012-11-04 DIAGNOSIS — N189 Chronic kidney disease, unspecified: Secondary | ICD-10-CM | POA: Insufficient documentation

## 2012-11-04 DIAGNOSIS — Y929 Unspecified place or not applicable: Secondary | ICD-10-CM | POA: Insufficient documentation

## 2012-11-04 DIAGNOSIS — J984 Other disorders of lung: Secondary | ICD-10-CM | POA: Insufficient documentation

## 2012-11-04 DIAGNOSIS — K219 Gastro-esophageal reflux disease without esophagitis: Secondary | ICD-10-CM | POA: Insufficient documentation

## 2012-11-04 DIAGNOSIS — F3289 Other specified depressive episodes: Secondary | ICD-10-CM | POA: Insufficient documentation

## 2012-11-04 HISTORY — PX: LOWER EXTREMITY ANGIOGRAM: SHX5508

## 2012-11-04 LAB — CBC
HCT: 39.5 % (ref 39.0–52.0)
MCV: 91.6 fL (ref 78.0–100.0)
RBC: 4.31 MIL/uL (ref 4.22–5.81)
WBC: 7.9 10*3/uL (ref 4.0–10.5)

## 2012-11-04 LAB — POCT ACTIVATED CLOTTING TIME
Activated Clotting Time: 196 seconds
Activated Clotting Time: 232 seconds
Activated Clotting Time: 247 seconds

## 2012-11-04 LAB — BASIC METABOLIC PANEL
CO2: 23 mEq/L (ref 19–32)
Chloride: 105 mEq/L (ref 96–112)
Creatinine, Ser: 1.05 mg/dL (ref 0.50–1.35)
Sodium: 140 mEq/L (ref 135–145)

## 2012-11-04 SURGERY — ANGIOGRAM, LOWER EXTREMITY
Anesthesia: LOCAL

## 2012-11-04 MED ORDER — CLOPIDOGREL BISULFATE 300 MG PO TABS
ORAL_TABLET | ORAL | Status: AC
  Filled 2012-11-04: qty 1

## 2012-11-04 MED ORDER — HEPARIN (PORCINE) IN NACL 2-0.9 UNIT/ML-% IJ SOLN
INTRAMUSCULAR | Status: AC
  Filled 2012-11-04: qty 1500

## 2012-11-04 MED ORDER — LIDOCAINE HCL (PF) 1 % IJ SOLN
INTRAMUSCULAR | Status: AC
  Filled 2012-11-04: qty 30

## 2012-11-04 MED ORDER — MIDAZOLAM HCL 2 MG/2ML IJ SOLN
INTRAMUSCULAR | Status: AC
  Filled 2012-11-04: qty 2

## 2012-11-04 MED ORDER — ACETAMINOPHEN 325 MG PO TABS: 650.0000 mg | ORAL_TABLET | ORAL | Status: DC | PRN

## 2012-11-04 MED ORDER — DIAZEPAM 5 MG PO TABS
5.0000 mg | ORAL_TABLET | ORAL | Status: AC
  Administered 2012-11-04: 5 mg via ORAL

## 2012-11-04 MED ORDER — SODIUM CHLORIDE 0.9 % IV SOLN: 250.0000 mL | INTRAVENOUS | Status: DC | PRN

## 2012-11-04 MED ORDER — SODIUM CHLORIDE 0.9 % IV SOLN
INTRAVENOUS | Status: DC
  Administered 2012-11-04: 11:00:00 via INTRAVENOUS

## 2012-11-04 MED ORDER — SODIUM CHLORIDE 0.9 % IJ SOLN: 3.0000 mL | Freq: Two times a day (BID) | INTRAMUSCULAR | Status: DC

## 2012-11-04 MED ORDER — HEPARIN SODIUM (PORCINE) 1000 UNIT/ML IJ SOLN
INTRAMUSCULAR | Status: AC
  Filled 2012-11-04: qty 1

## 2012-11-04 MED ORDER — FENTANYL CITRATE 0.05 MG/ML IJ SOLN
INTRAMUSCULAR | Status: AC
  Filled 2012-11-04: qty 2

## 2012-11-04 MED ORDER — SODIUM CHLORIDE 0.9 % IJ SOLN: 3.0000 mL | INTRAMUSCULAR | Status: DC | PRN

## 2012-11-04 MED ORDER — CLOPIDOGREL BISULFATE 75 MG PO TABS: 75.0000 mg | ORAL_TABLET | Freq: Every day | ORAL | Status: AC

## 2012-11-04 MED ORDER — SODIUM CHLORIDE 0.9 % IV SOLN: INTRAVENOUS | Status: DC

## 2012-11-04 NOTE — H&P (View-Only) (Signed)
 HPI:  61 year old presenting for followup evaluation. The patient has lower extremity peripheral arterial disease with recurrent atheroembolic events to the left foot. He did well after his last intervention of in-stent restenosis in the left tibioperoneal trunk. However, he recently returned with a painless rash involving the left great toe and medial foot, with typical appearance of an athero embolic event. I saw him a few weeks ago and ordered an echocardiogram and a CTA of the chest abdomen and pelvis with runoff to the feet. This demonstrated scattered atheromatous the aorta but no significant findings. The echocardiogram was within normal limits. The left tibioperoneal stent had greater than 70% stenosis. He presents today for followup.  The patient reports no change in symptoms. His rash is unchanged. He denies pain or weakness of the left foot. He does have numbness of both feet. This is unchanged over time. He's had no chest pain, dyspnea, or other complaints.  Outpatient Encounter Prescriptions as of 10/21/2012  Medication Sig Dispense Refill  . acetaminophen (TYLENOL) 500 MG tablet Take 500 mg by mouth every 6 (six) hours as needed.        . amLODipine (NORVASC) 10 MG tablet Take 5 mg by mouth daily.        . aspirin EC 81 MG tablet Take 1 tablet (81 mg total) by mouth daily.  90 tablet  3  . cholecalciferol (VITAMIN D) 1000 UNITS tablet Take 1,000 Units by mouth daily.      . citalopram (CELEXA) 10 MG tablet Take 10 mg by mouth daily.        . folic acid (FOLVITE) 1 MG tablet Take 1 mg by mouth daily.       . gabapentin (NEURONTIN) 600 MG tablet Take 600 mg by mouth 3 (three) times daily.        . hydrOXYzine (ATARAX) 50 MG tablet Take 50 mg by mouth 4 (four) times daily.        . methotrexate (RHEUMATREX) 2.5 MG tablet Take 2.5 mg by mouth once a week. Caution:Chemotherapy. Protect from light. 5 tablets once a week on Friday       . mirtazapine (REMERON) 15 MG tablet Take 15 mg by  mouth at bedtime.        . multivitamin (THERAGRAN) per tablet Take 1 tablet by mouth daily.        . Omega-3 Krill Oil 500 MG CAPS Take 1 capsule by mouth daily.      . pantoprazole (PROTONIX) 40 MG tablet Take 40 mg by mouth daily.        . pravastatin (PRAVACHOL) 40 MG tablet Take 40 mg by mouth at bedtime.        . Thiamine HCl (VITAMIN B-1) 250 MG tablet Take 250 mg by mouth daily.        . Ticagrelor (BRILINTA) 90 MG TABS tablet Take 90 mg by mouth daily.      . vitamin E 600 UNIT capsule Take 600 Units by mouth daily.        . [DISCONTINUED] Ticagrelor (BRILINTA) 90 MG TABS tablet Take 1 tablet (90 mg total) by mouth 2 (two) times daily.  60 tablet  6   No facility-administered encounter medications on file as of 10/21/2012.    Allergies  Allergen Reactions  . Neomycin-Bacitracin Zn-Polymyx   . Niacin   . Paroxetine     REACTION: goofy    Past Medical History  Diagnosis Date  . Peripheral vascular disease, unspecified       lower extremity peripheral artery disease  . Unspecified hypertensive kidney disease with chronic kidney disease stage I through stage IV, or unspecified(403.90)     cholesterol embolization syndrome  . GERD (gastroesophageal reflux disease)   . Exposure to STD   . Dysfunction of eustachian tube   . Routine general medical examination at a health care facility   . Family history of psychiatric condition     family Hx of suicide   . Alcoholism in family   . Family history of diabetes mellitus   . Lipoprotein deficiencies   . Impotence of organic origin     erectile dysfunction  . Other diseases of lung, not elsewhere classified     pulmonary nodule  . Sprain of neck     Hx  . Diverticulosis of colon (without mention of hemorrhage)   . Depressive disorder, not elsewhere classified   . Personal history of colonic polyps   . Tobacco use disorder     ROS: Negative except as per HPI  BP 130/80  Pulse 62  Ht 6' 4" (1.93 m)  Wt 86.183 kg (190 lb)   BMI 23.14 kg/m2  PHYSICAL EXAM: Pt is alert and oriented, NAD HEENT: normal Neck: JVP - normal, carotids 2+= without bruits Lungs: CTA bilaterally CV: RRR without murmur or gallop Abd: soft, NT, Positive BS, no hepatomegaly Ext: no C/C/E, distal pulses intact and equal Skin: warm/dry no rash  2D Echo: Left ventricle: The cavity size was normal. Wall thickness was normal. Systolic function was normal. The estimated ejection fraction was in the range of 55% to 60%. Wall motion was normal; there were no regional wall motion abnormalities. The transmitral flow pattern was normal. The deceleration time of the early transmitral flow velocity was normal. The pulmonary vein flow pattern was normal. The tissue Doppler parameters were normal. Left ventricular diastolic function parameters were normal.  ------------------------------------------------------------ Aortic valve: Structurally normal valve. Cusp separation was normal. Doppler: Transvalvular velocity was within the normal range. There was no stenosis. No regurgitation.  ------------------------------------------------------------ Aorta: The aorta was normal, not dilated, and non-diseased.  ------------------------------------------------------------ Mitral valve: Structurally normal valve. Leaflet separation was normal. Doppler: Transvalvular velocity was within the normal range. There was no evidence for stenosis. No regurgitation. Peak gradient: 3mm Hg (D).  ------------------------------------------------------------ Left atrium: The atrium was at the upper limits of normal in size.  ------------------------------------------------------------ Right ventricle: The cavity size was normal. Wall thickness was normal. Systolic function was normal.  ------------------------------------------------------------ Pulmonic valve: Structurally normal valve. Cusp separation was normal. Doppler: Transvalvular velocity  was within the normal range. No regurgitation.  ------------------------------------------------------------ Tricuspid valve: Structurally normal valve. Leaflet separation was normal. Doppler: Transvalvular velocity was within the normal range. Mild regurgitation.  ------------------------------------------------------------ Pulmonary artery: The main pulmonary artery was normal-sized. Systolic pressure was within the normal range.  ------------------------------------------------------------ Right atrium: The atrium was normal in size.  ------------------------------------------------------------ Pericardium: The pericardium was normal in appearance.  ------------------------------------------------------------ Systemic veins: Inferior vena cava: The vessel was normal in size; the respirophasic diameter changes were in the normal range (= 50%); findings are consistent with normal central venous pressure.  ------------------------------------------------------------ Post procedure conclusions Ascending Aorta:  - The aorta was normal, not dilated, and non-diseased.   CTA Chest: IMPRESSION:  1. Negative for pulmonary embolism.  2. Paraseptal and centrilobular emphysema, progressive since 2008.  3. Several pulmonary nodules are stable for greater than 2 years,  and can be considered benign.  4. Coronary artery atherosclerotic calcification.  CTA: IMPRESSION:  1. Scattered   minimal atherosclerotic plaque within a normal  caliber abdominal aorta.  2. There has been apparent collapse of the arterial stent within  the distal aspect of the left below-knee popliteal artery. While  the stent remains patent, it is narrowed in the AP direction  resulting in likely >70% luminal narrowing. Patent three-vessel  runoff to the left foot without definitive evidence of distal  embolism.  3. No evidence of hemodynamically significant narrowing within the  right lower extremity.  4.  Grossly unchanged shoddy porta hepatis and retroperitoneal  lymph nodes within the upper abdomen, stable since the 2008  examination and thus favored to be of a benign etiology, likely  reactive.  5. Incidentally noted bilateral L5 pars defects with associated  minimal (grade 1) anterolisthesis of L5 upon S1  ASSESSMENT AND PLAN: Atheroembolism, left foot. The patient has recurrent atheroembolism. I have personally reviewed his CT angiography images as well as his echocardiogram. There is no clear source of atheroembolism other than his stent in the left tibioperoneal trunk. The CT angiogram demonstrates restenosis of the stent. In the past this is been associated with emboli to his left foot. I have changed his antiplatelet therapy to a more potent agent with brilinta. I have recommended that he undergo lower sternum angiography with consideration of PTA and stenting of the left leg. I have reviewed his case with Dr. Arida. He will perform the procedure. The patient will continue his current medical program. I have reviewed the risks, indications, and alternatives with the patient. Specific risks include vascular injury, bleeding, embolism, and emergency surgery. The patient understands and agrees to proceed  Chairty Toman 10/23/2012 3:20 PM      

## 2012-11-04 NOTE — Interval H&P Note (Signed)
History and Physical Interval Note:  11/04/2012 12:32 PM  David David  has presented today for surgery, with the diagnosis of claudication  The various methods of treatment have been discussed with the patient and family. After consideration of risks, benefits and other options for treatment, the patient has consented to  Procedure(s): LOWER EXTREMITY ANGIOGRAM (N/A) as a surgical intervention .  The patient's history has been reviewed, patient examined, no change in status, stable for surgery.  I have reviewed the patient's chart and labs.  Questions were answered to the patient's satisfaction.     Lorine Bears

## 2012-11-04 NOTE — CV Procedure (Signed)
PERIPHERAL VASCULAR PROCEDURE  NAME:  David David   MRN: [id number] DOB:  March 18, 1951   ADMIT DATE: 11/04/2012  Performing Cardiologist: Lorine Bears Primary Physician: Raquel Sarna, MD Primary Cardiologist:  Dr. [name]  Procedures Performed:  Abdominal Aortic Angiogram with Bi-Iliofemoral Runoff  Selective left lower extremity angiography  Angioplasty of the ostial TP trunk, ostial anterior tibial and distal popliteal artery using an Angioscore balloon  Mynx closure device.  Indication(s):   Peripheral arterial disease with previous stenting of the distal left popliteal artery with current embolization to the big toe with tissue necrosis similar to previous presentation in the setting of restenosis   Consent: The procedure with Risks/Benefits/Alternatives and Indications was reviewed with the patient .  All questions were answered.  Medications:  Sedation:   Contrast:  113 ml  Visipaque   Procedural details: The right groin was prepped, draped, and anesthetized with 1% lidocaine. Using modified Seldinger technique, a 5 French sheath was introduced into the right common femoral artery. A 5 Fr Short Pigtail Catheter was advanced of over a  Versicore wire into the descending Aorta to a level just below the renal arteries. A power injection of 81ml/sec contrast over 1 sec was performed for Abdominal Aortic Angiography and iliac angiography.   The pigtail catheter was changed over the Versicore wire for A crossover catheter which was then pulled back the aortic bifurcation and the wire was advanced down the contralateral left common iliac artery.  The wire was then advanced to the contralateral common femoral artery, the catheter was exchanged into an end hole straight tip catheter which was advanced over the wire to the common femoral artery. Contralateral second-order lower extremity angiography was performed via power injection of 5 ml / sec contrast for a total of 35  ml.   Interventional Procedure:  I had a prolonged discussion about management of this case with Dr. [name] his primary cardiologist and reviewed the images with him. There is significant in-stent restenosis at the distal edge extending into the ostium of the anterior tibial as well as the TP trunk. The distal anterior tibial also is occluded distally from what seems to be embolization. We decided that the best management option is likely balloon angioplasty and avoiding additional stent placement.  The existing catheter was removed after advancing the versicolor wire. The sheath was exchanged into a 6 French 55 cm and so sheath. The patient was given unfractionated heparin with an ACT around 250. A [place] core wire was used across the lesion in the TP trunk with a CXI angled catheter. A run through wire was used to cross the lesion in the anterior tibial artery in the same fashion. The lesion in the TP trunk was ballooned with a 3 mm x 15. The same balloon was used for balloon angioplasty of the ostial anterior tibial with prolonged inflation. I then took a 4 by 20 mm Angioscore balloon and dilated the lesion and a distal popliteal artery and to the TP trunk to 10 atmospheres for 2 minutes. Angiography showed significant improvement in appearance there was still residual stenosis in the ostial anterior tibial. I decided to use the same 4 X 20 mm Angioscore balloon to dilate the ostial anterior tibial to 2 atmospheres for 2 minutes. There was residual 30% stenosis in the ostial anterior tibial and 20% residual stenosis in the TP trunk. I felt that more aggressive dilatation might lead to dissection. The sheath was then removed and exchanged into a short  6 French sheath. The site was closed with a Mynx closure device.  Hemodynamics:  Central Aortic Pressure / Mean Aortic Pressure: 160/70.  Findings:  Abdominal aorta: There is mild diffuse atherosclerosis with no evidence of obstructive disease in the  distal abdominal aorta  Right common iliac artery: Minor irregularities.  Right internal iliac artery: Patent with minor irregularities.  Right external iliac artery: Minor irregularities.  Left common iliac artery:  Minor irregularities.  Left internal iliac artery: Occluded at the ostium.  Left external iliac artery: No significant disease.  Left common femoral artery: Minor irregularities.  Left profunda femoral artery: Normal  Left superficial femoral artery:  Normal  Left popliteal artery: Normal proximally with 70% distal in-stent restenosis just above the bifurcation.  Left tibial peroneal trunk: Ostial 80-90%  restenosis  Left anterior tibial artery: 70-80% ostial stenosis. The vessel is occluded distally.  Left peroneal artery: Normal in size with no significant disease.  Left posterior tibial artery: Normal.  Conclusions: 1. Occluded left internal iliac artery. 2. Severe distal in-stent restenosis in the distal popliteal artery extending into the ostium of the anterior tibial and TP trunk. Occluded anterior tibial distally likely from embolization. 3. Successful balloon angioplasty of the distal popliteal artery, ostial TP trunk and ostial anterior tibial.  Recommendations:  Due to poor intolerance to Brilinta, the patient would be switched back to Plavix.   Lorine Bears, MD, [facility] 11/04/2012 2:38 PM

## 2012-11-04 NOTE — Progress Notes (Signed)
UP AND WALKED AND TOL WELL AND RIGHT GROIN STABLE; NO BLEEDING OR HEMATOMA 

## 2012-11-05 ENCOUNTER — Encounter: Payer: Self-pay | Admitting: Cardiovascular Disease

## 2012-11-17 ENCOUNTER — Encounter (INDEPENDENT_AMBULATORY_CARE_PROVIDER_SITE_OTHER): Payer: Medicare Other | Admitting: Cardiology

## 2012-11-17 DIAGNOSIS — I739 Peripheral vascular disease, unspecified: Secondary | ICD-10-CM

## 2012-11-24 ENCOUNTER — Encounter: Payer: Medicare Other | Admitting: Cardiovascular Disease

## 2012-11-24 ENCOUNTER — Ambulatory Visit (INDEPENDENT_AMBULATORY_CARE_PROVIDER_SITE_OTHER): Payer: Medicare Other | Admitting: Cardiovascular Disease

## 2012-11-24 VITALS — BP 126/78 | HR 66 | Ht 76.0 in | Wt 195.0 lb

## 2012-11-24 DIAGNOSIS — I739 Peripheral vascular disease, unspecified: Secondary | ICD-10-CM

## 2012-11-24 NOTE — Assessment & Plan Note (Addendum)
The patient had significant improvement after recent balloon angioplasty of the anterior tibial and tibioperoneal trunk. Postprocedure ABI was normal with normal toe pressure including the left big toe. Continue lifelong dual antiplatelet therapy. If he develops take restenosis, the options include bypass versus drug-eluting stent placement (kissing stents) to both ostial anterior tibial and ostial tibioperoneal trunk. The patient will followup in 6 months after an ABI and lower extremity duplex.

## 2012-11-24 NOTE — Progress Notes (Signed)
HPI:  62 year old presenting for followup evaluation. The patient has lower extremity peripheral arterial disease with recurrent atheroembolic events to the left foot due to distal left popliteal artery stenosis with previous Cypher drug-eluting stent placement which was used in order to treat an ulcerated plaque with distal embolization. He had presentation and 2011 and was found to have distal edge restenosis extending into the ostium anterior tibial and tibioperoneal trunk. He had balloon angioplasty done with resolution of symptoms. He was recently seen by Dr. [name] for a painless rash involving the left great toe and medial foot, with typical appearance of an athero embolic event. Echocardiogram was unremarkable.  CTA of the chest abdomen and pelvis with runoff to the feet demonstrated scattered atheromatous the aorta but no significant findings.  The left tibioperoneal stent had greater than 70% stenosis. ABI was normal but there was significant dampening in pressure noted in the big toe I proceeded with angiography which confirmed this and also showed an occluded dorsalis pedis this study likely from embolization. I performed balloon angioplasty with an Angioscore balloon to both ostial anterior tibial and ostial tibioperoneal trunk with good results. Before the angiogram, the patient had worsening of black discoloration of tip of the big toe. After the procedure, the patient had resolution of symptoms. The discoloration has resolved. There was no tissue loss.  Outpatient Encounter Prescriptions as of 11/24/2012  Medication Sig Dispense Refill  . acetaminophen (TYLENOL) 500 MG tablet Take 500 mg by mouth every 6 (six) hours as needed.        Marland Kitchen amLODipine (NORVASC) 10 MG tablet Take 5 mg by mouth daily.        Marland Kitchen aspirin EC 81 MG tablet Take 1 tablet (81 mg total) by mouth daily.  90 tablet  3  . cholecalciferol (VITAMIN D) 1000 UNITS tablet Take 1,000 Units by mouth daily.      . clopidogrel  (PLAVIX) 75 MG tablet Take 1 tablet (75 mg total) by mouth daily.  30 tablet  3  . cyclobenzaprine (FLEXERIL) 10 MG tablet Take 1 tablet (10 mg total) by mouth at bedtime.  30 tablet    . escitalopram (LEXAPRO) 10 MG tablet Take 1 tablet (10 mg total) by mouth 3 (three) times daily.      . finasteride (PROSCAR) 5 MG tablet Take 5 mg by mouth at bedtime.      . folic acid (FOLVITE) 1 MG tablet Take 1 mg by mouth daily.       Marland Kitchen gabapentin (NEURONTIN) 600 MG tablet Take 600 mg by mouth 3 (three) times daily.        . hydrOXYzine (ATARAX) 50 MG tablet Take 50 mg by mouth 4 (four) times daily.        Marland Kitchen levothyroxine (SYNTHROID, LEVOTHROID) 75 MCG tablet Take 1 tablet (75 mcg total) by mouth daily before breakfast.      . methotrexate (RHEUMATREX) 2.5 MG tablet Take 2.5 mg by mouth once a week. Caution:Chemotherapy. Protect from light. 5 tablets once a week on Friday       . mirtazapine (REMERON) 15 MG tablet Take 15 mg by mouth at bedtime.        . multivitamin (THERAGRAN) per tablet Take 1 tablet by mouth daily.        . Omega-3 Krill Oil 500 MG CAPS Take 1 capsule by mouth daily.      Marland Kitchen oxybutynin (DITROPAN) 5 MG tablet Take 5 mg by mouth at bedtime.      Marland Kitchen  pantoprazole (PROTONIX) 40 MG tablet Take 40 mg by mouth daily.        . pravastatin (PRAVACHOL) 40 MG tablet Take 40 mg by mouth at bedtime.        . prazosin (MINIPRESS) 2 MG capsule Take 2 capsules (4 mg total) by mouth at bedtime.      . Thiamine HCl (VITAMIN B-1) 250 MG tablet Take 250 mg by mouth daily.        . vitamin E 600 UNIT capsule Take 600 Units by mouth daily.         No facility-administered encounter medications on file as of 11/24/2012.    Allergies  Allergen Reactions  . Neomycin-Bacitracin Zn-Polymyx     unknown  . Niacin     unknown  . Paroxetine     REACTION: goofy    Past Medical History  Diagnosis Date  . Peripheral vascular disease, unspecified     lower extremity peripheral artery disease  . Unspecified  hypertensive kidney disease with chronic kidney disease stage I through stage IV, or unspecified(403.90)     cholesterol embolization syndrome  . GERD (gastroesophageal reflux disease)   . Exposure to STD   . Dysfunction of eustachian tube   . Routine general medical examination at a health care facility   . Family history of psychiatric condition     family Hx of suicide   . Alcoholism in family   . Family history of diabetes mellitus   . Lipoprotein deficiencies   . Impotence of organic origin     erectile dysfunction  . Other diseases of lung, not elsewhere classified     pulmonary nodule  . Sprain of neck     Hx  . Diverticulosis of colon (without mention of hemorrhage)   . Depressive disorder, not elsewhere classified   . Personal history of colonic polyps   . Tobacco use disorder     ROS: Negative except as per HPI  BP 126/78  Pulse 66  Ht 6\' 4"  (1.93 m)  Wt 195 lb (88.451 kg)  BMI 23.75 kg/m2  PHYSICAL EXAM: Pt is alert and oriented, NAD HEENT: normal Neck: JVP - normal, carotids 2+= without bruits Lungs: CTA bilaterally CV: RRR without murmur or gallop Abd: soft, NT, Positive BS, no hepatomegaly Ext: no C/C/E. dorsalis pedis is diminished bilaterally. Posterior tibial is also diminished on both sides Skin: warm/dry no rash ------------------------------------------------------------    Assessment and plan:

## 2012-11-24 NOTE — Patient Instructions (Addendum)
**Note De-Identified  Obfuscation** Your physician has requested that you have a lower or upper extremity arterial duplex. This test is an ultrasound of the arteries in the legs or arms. It looks at arterial blood flow in the legs and arms. Allow one hour for Lower and Upper Arterial scans. There are no restrictions or special instructions. Please schedule for 05/05/13.  Your physician wants you to follow-up in: 1 week after tests. You will receive a reminder letter in the mail two months in advance. If you don't receive a letter, please call our office to schedule the follow-up appointment.

## 2013-01-21 ENCOUNTER — Other Ambulatory Visit: Payer: Self-pay

## 2013-03-23 NOTE — Progress Notes (Signed)
This encounter was created in error - please disregard.

## 2013-05-03 ENCOUNTER — Encounter: Payer: Self-pay | Admitting: Cardiovascular Disease

## 2013-05-05 ENCOUNTER — Encounter (HOSPITAL_COMMUNITY): Payer: Medicare Other

## 2013-05-13 ENCOUNTER — Encounter (HOSPITAL_COMMUNITY): Payer: Medicare Other

## 2013-05-17 ENCOUNTER — Ambulatory Visit (HOSPITAL_COMMUNITY): Payer: Medicare Other | Attending: Cardiovascular Disease

## 2013-05-17 DIAGNOSIS — I739 Peripheral vascular disease, unspecified: Secondary | ICD-10-CM

## 2013-05-17 DIAGNOSIS — L03119 Cellulitis of unspecified part of limb: Principal | ICD-10-CM

## 2013-05-17 DIAGNOSIS — L02419 Cutaneous abscess of limb, unspecified: Secondary | ICD-10-CM | POA: Insufficient documentation

## 2013-05-23 ENCOUNTER — Ambulatory Visit (INDEPENDENT_AMBULATORY_CARE_PROVIDER_SITE_OTHER): Payer: Medicare Other | Admitting: Internal Medicine

## 2013-05-23 VITALS — BP 130/90 | HR 73 | Temp 98.7°F | Resp 16 | Ht 75.0 in | Wt 189.0 lb

## 2013-05-23 DIAGNOSIS — L03119 Cellulitis of unspecified part of limb: Secondary | ICD-10-CM

## 2013-05-23 DIAGNOSIS — F17200 Nicotine dependence, unspecified, uncomplicated: Secondary | ICD-10-CM

## 2013-05-23 DIAGNOSIS — I739 Peripheral vascular disease, unspecified: Secondary | ICD-10-CM

## 2013-05-23 DIAGNOSIS — L02419 Cutaneous abscess of limb, unspecified: Secondary | ICD-10-CM

## 2013-05-23 DIAGNOSIS — L03116 Cellulitis of left lower limb: Secondary | ICD-10-CM

## 2013-05-23 MED ORDER — SILVER SULFADIAZINE 1 % EX CREA: 1.0000 "application " | TOPICAL_CREAM | Freq: Two times a day (BID) | CUTANEOUS | Status: DC

## 2013-05-23 MED ORDER — DOXYCYCLINE HYCLATE 100 MG PO TABS: 100.0000 mg | ORAL_TABLET | Freq: Two times a day (BID) | ORAL | Status: DC

## 2013-05-23 NOTE — Progress Notes (Addendum)
   Subjective:    Patient ID: David David, male    DOB: [birth year], 63 y.o.   MRN: [id number] This chart was scribed for Tami Lin, MD by Vernell Barrier, Medical Scribe. This patient's care was started at 6:08 PM.  Rash   HPI Comments: David David is a 63 y.o. male w/hx of PAD, HL, tobacco abuse and cholesterol embolization syndrome who presents for his first visit to [facility] complaining of a gradually worsening mildy itchy localized rash on the lower left leg, initial onset 4 weeks ago. Pt states he bumped it on something and developed a wound he states was about the size of his thumb nail. Was given antibacterial ointment from the [place] which does his primary care  which he used, and states the site of the injury has since spread around the around of the wound remaining in the lower left leg.  Chart reveals that the Pt had an arterial Doppler performed on his leg 6 days ago and states the wound and rash were not as severe as they are today. He doesn't know the results.  No fever chills or night sweats. No chest pain. He has had decreased sensation in his legs with some pain for many years.  Review of Systems  Skin: Positive for rash.   no fever chills or night sweats No GI symptoms No GU symptoms  Objective:   Physical Exam  Nursing note and vitals reviewed. Constitutional: He is oriented to person, place, and time. He appears well-developed and well-nourished. No distress.  HENT:  Head: Normocephalic and atraumatic.  Eyes: EOM are normal. Pupils are equal, round, and reactive to light.  Neck: Neck supple.  Cardiovascular: Normal rate, regular rhythm, normal heart sounds and intact distal pulses.   Pulmonary/Chest: Effort normal and breath sounds normal. No respiratory distress.  Musculoskeletal: Normal range of motion. He exhibits no edema.  Neurological: He is alert and oriented to person, place, and time.  Skin: Skin is warm and dry.  The  skin over the left anterior tibia in the mid section has a 6 cm x 5 cm area with central deterioration of skin and spreading erythema--there is no frank pus and is only mildly tender//  Psychiatric: He has a normal mood and affect. His behavior is normal.   BP 130/90  Pulse 73  Temp(Src) 98.7 F (37.1 C) (Oral)  Resp 16  Ht 6\' 3"  (1.905 m)  Wt 189 lb (85.73 kg)  BMI 23.62 kg/m2  SpO2 96%  Assessment & Plan:  I have completed the patient encounter in its entirety as documented by the scribe, with editing by me where necessary. Erubiel Manasco P. Laney Pastor, M.D.  Impression-early cellulitis with some component of allergic reaction to the topical antibiotic cream (he has a past history of this record in the chart)--- his peripheral arterial flow should be good enough to support the healing of this injury  Plan-doxycycline/Silvadene dressings twice a day If not responding over the next 7 days will refer to the wound center for therapy

## 2013-11-25 ENCOUNTER — Other Ambulatory Visit (HOSPITAL_COMMUNITY): Payer: Self-pay

## 2013-11-25 DIAGNOSIS — I739 Peripheral vascular disease, unspecified: Secondary | ICD-10-CM

## 2013-11-25 DIAGNOSIS — I70219 Atherosclerosis of native arteries of extremities with intermittent claudication, unspecified extremity: Secondary | ICD-10-CM

## 2013-11-29 ENCOUNTER — Other Ambulatory Visit (HOSPITAL_COMMUNITY): Payer: Self-pay | Admitting: Cardiology

## 2013-11-29 ENCOUNTER — Ambulatory Visit (HOSPITAL_COMMUNITY): Payer: Medicare Other | Attending: Internal Medicine | Admitting: Cardiology

## 2013-11-29 DIAGNOSIS — I739 Peripheral vascular disease, unspecified: Secondary | ICD-10-CM

## 2013-11-29 DIAGNOSIS — E785 Hyperlipidemia, unspecified: Secondary | ICD-10-CM | POA: Insufficient documentation

## 2013-11-29 DIAGNOSIS — F17200 Nicotine dependence, unspecified, uncomplicated: Secondary | ICD-10-CM | POA: Insufficient documentation

## 2013-11-29 DIAGNOSIS — I70219 Atherosclerosis of native arteries of extremities with intermittent claudication, unspecified extremity: Secondary | ICD-10-CM | POA: Insufficient documentation

## 2013-11-29 DIAGNOSIS — I10 Essential (primary) hypertension: Secondary | ICD-10-CM | POA: Insufficient documentation

## 2013-11-29 NOTE — Progress Notes (Signed)
ABI and left lower arterial duplex performed

## 2013-12-04 ENCOUNTER — Encounter (HOSPITAL_COMMUNITY): Payer: Self-pay | Admitting: Emergency Medicine

## 2013-12-04 ENCOUNTER — Emergency Department (HOSPITAL_COMMUNITY)
Admission: EM | Admit: 2013-12-04 | Discharge: 2013-12-04 | Disposition: A | Discharge status: Home / Self Care | Payer: Medicare Other | Source: Home / Self Care | Attending: Family Medicine | Admitting: Family Medicine

## 2013-12-04 DIAGNOSIS — L97519 Non-pressure chronic ulcer of other part of right foot with unspecified severity: Secondary | ICD-10-CM

## 2013-12-04 DIAGNOSIS — L97509 Non-pressure chronic ulcer of other part of unspecified foot with unspecified severity: Secondary | ICD-10-CM

## 2013-12-04 DIAGNOSIS — J40 Bronchitis, not specified as acute or chronic: Secondary | ICD-10-CM

## 2013-12-04 DIAGNOSIS — L03119 Cellulitis of unspecified part of limb: Secondary | ICD-10-CM

## 2013-12-04 DIAGNOSIS — L02419 Cutaneous abscess of limb, unspecified: Secondary | ICD-10-CM

## 2013-12-04 DIAGNOSIS — L03115 Cellulitis of right lower limb: Secondary | ICD-10-CM

## 2013-12-04 MED ORDER — CEFUROXIME AXETIL 500 MG PO TABS: 500.0000 mg | ORAL_TABLET | Freq: Two times a day (BID) | ORAL | Status: DC

## 2013-12-04 MED ORDER — PREDNISONE 10 MG PO TABS: 30.0000 mg | ORAL_TABLET | Freq: Every day | ORAL | Status: DC

## 2013-12-04 MED ORDER — CEFTRIAXONE SODIUM 1 G IJ SOLR
INTRAMUSCULAR | Status: AC
  Filled 2013-12-04: qty 10

## 2013-12-04 MED ORDER — CEFTRIAXONE SODIUM 1 G IJ SOLR
1.0000 g | Freq: Once | INTRAMUSCULAR | Status: AC
  Administered 2013-12-04: 1 g via INTRAMUSCULAR

## 2013-12-04 MED ORDER — DOXYCYCLINE HYCLATE 100 MG PO CAPS: 100.0000 mg | ORAL_CAPSULE | Freq: Two times a day (BID) | ORAL | Status: DC

## 2013-12-04 MED ORDER — IPRATROPIUM-ALBUTEROL 0.5-2.5 (3) MG/3ML IN SOLN
RESPIRATORY_TRACT | Status: AC
  Filled 2013-12-04: qty 3

## 2013-12-04 MED ORDER — LIDOCAINE HCL (PF) 1 % IJ SOLN
INTRAMUSCULAR | Status: AC
  Filled 2013-12-04: qty 5

## 2013-12-04 MED ORDER — IPRATROPIUM-ALBUTEROL 0.5-2.5 (3) MG/3ML IN SOLN
3.0000 mL | Freq: Once | RESPIRATORY_TRACT | Status: AC
  Administered 2013-12-04: 3 mL via RESPIRATORY_TRACT

## 2013-12-04 NOTE — ED Provider Notes (Signed)
David David is a 63 y.o. male who presents to [facility]gent Care today for right great toe infection and cough.  1) patient has had an ulcer at the plantar aspect of his right great toe since early September. Today he noted pain and redness streaking to his mid shin. He does note a mild fever as well. The pain is mild. No nausea vomiting or diarrhea.  2) cough. Patient has had a several day history for cough and wheezing. The cough is productive. Patient has had multiple episodes of bronchitis in the past. He is a current smoker.   Past Medical History  Diagnosis Date  . Peripheral vascular disease, unspecified     lower extremity peripheral artery disease  . Unspecified hypertensive kidney disease with chronic kidney disease stage I through stage IV, or unspecified     cholesterol embolization syndrome  . GERD (gastroesophageal reflux disease)   . Exposure to STD   . Dysfunction of eustachian tube   . Routine general medical examination at a health care facility   . Family history of psychiatric condition     family Hx of suicide   . Alcoholism in family   . Family history of diabetes mellitus   . Lipoprotein deficiencies   . Impotence of organic origin     erectile dysfunction  . Other diseases of lung, not elsewhere classified     pulmonary nodule  . Sprain of neck     Hx  . Diverticulosis of colon (without mention of hemorrhage)   . Depressive disorder, not elsewhere classified   . Personal history of colonic polyps   . Tobacco use disorder    History  Substance Use Topics  . Smoking status: Former Research scientist (life sciences)  . Smokeless tobacco: Not on file     Comment: QUIT 2009  . Alcohol Use: No     Comment: denies alcohol use. there is a history of heavy alcohol use    ROS as above Medications: No current facility-administered medications for this encounter.   Current Outpatient Prescriptions  Medication Sig Dispense Refill  . acetaminophen (TYLENOL) 500 MG tablet Take 500 mg by  mouth every 6 (six) hours as needed.        Marland Kitchen amLODipine (NORVASC) 10 MG tablet Take 5 mg by mouth daily.        Marland Kitchen aspirin EC 81 MG tablet Take 1 tablet (81 mg total) by mouth daily.  90 tablet  3  . cefUROXime (CEFTIN) 500 MG tablet Take 1 tablet (500 mg total) by mouth 2 (two) times daily with a meal.  14 tablet  0  . cholecalciferol (VITAMIN D) 1000 UNITS tablet Take 1,000 Units by mouth daily.      . clopidogrel (PLAVIX) 75 MG tablet Take 1 tablet (75 mg total) by mouth daily.  30 tablet  3  . cyclobenzaprine (FLEXERIL) 10 MG tablet Take 1 tablet (10 mg total) by mouth at bedtime.  30 tablet    . doxycycline (VIBRA-TABS) 100 MG tablet Take 1 tablet (100 mg total) by mouth 2 (two) times daily.  20 tablet  0  . doxycycline (VIBRAMYCIN) 100 MG capsule Take 1 capsule (100 mg total) by mouth 2 (two) times daily.  14 capsule  0  . escitalopram (LEXAPRO) 10 MG tablet Take 1 tablet (10 mg total) by mouth 3 (three) times daily.      . finasteride (PROSCAR) 5 MG tablet Take 5 mg by mouth at bedtime.      Marland Kitchen  folic acid (FOLVITE) 1 MG tablet Take 1 mg by mouth daily.       Marland Kitchen gabapentin (NEURONTIN) 600 MG tablet Take 600 mg by mouth 3 (three) times daily.        . hydrOXYzine (ATARAX) 50 MG tablet Take 50 mg by mouth 4 (four) times daily.        Marland Kitchen levothyroxine (SYNTHROID, LEVOTHROID) 75 MCG tablet Take 1 tablet (75 mcg total) by mouth daily before breakfast.      . methotrexate (RHEUMATREX) 2.5 MG tablet Take 2.5 mg by mouth once a week. Caution:Chemotherapy. Protect from light. 5 tablets once a week on Friday       . mirtazapine (REMERON) 15 MG tablet Take 15 mg by mouth at bedtime.        . multivitamin (THERAGRAN) per tablet Take 1 tablet by mouth daily.        . Omega-3 Krill Oil 500 MG CAPS Take 1 capsule by mouth daily.      Marland Kitchen oxybutynin (DITROPAN) 5 MG tablet Take 5 mg by mouth at bedtime.      . pantoprazole (PROTONIX) 40 MG tablet Take 40 mg by mouth daily.        . pravastatin (PRAVACHOL) 40  MG tablet Take 40 mg by mouth at bedtime.        . prazosin (MINIPRESS) 2 MG capsule Take 2 capsules (4 mg total) by mouth at bedtime.      . predniSONE (DELTASONE) 10 MG tablet Take 3 tablets (30 mg total) by mouth daily.  15 tablet  0  . silver sulfADIAZINE (SILVADENE) 1 % cream Apply 1 application topically 2 (two) times daily.  50 g  0  . Thiamine HCl (VITAMIN B-1) 250 MG tablet Take 250 mg by mouth daily.        . vitamin E 600 UNIT capsule Take 600 Units by mouth daily.          Exam:  BP 156/84  Pulse 100  Temp(Src) 99.8 F (37.7 C) (Oral)  Resp 18  SpO2 96% Gen: Well NAD HEENT: EOMI,  MMM Lungs: Normal work of breathing. Coarse breath sounds with prolonged expiratory phase. Heart: RRR no MRG Abd: NABS, Soft. Nondistended, Nontender Exts: Brisk capillary refill, warm and well perfused.  Right great toe: Small less than 1 cm unstageable ulcer on the plantar aspect of the great toe with erythema surrounding. Patient has streaking erythema to the mid tibia.         Patient was given a DuoNeb nebulizer treatment, and his pulmonary exam improved. Patient was also given 1 g of ceftriaxone intramuscularly  No results found for this or any previous visit (from the past 24 hour(s)). No results found.  Assessment and Plan: 63 y.o. male with  1) toe ulcer secondary to peripheral arterial disease with infection. Patient additionally has resulting cellulitis. Plan to treat with muscular ceftriaxone, doxycycline and Ceftin as noted above. Present to the emergency room if not improving or worsening. Followup with podiatry.  2) bronchitis versus COPD exacerbation. Treat with albuterol and prednisone.  Recommend smoking cessation  Discussed warning signs or symptoms. Please see discharge instructions. Patient expresses understanding.     Gregor Hams, MD 12/04/13 [id number]

## 2013-12-04 NOTE — ED Notes (Signed)
Reported pain in foot, red streaks from toe to proximal tibia, chills since yesterday. Ulcerated area plantar surface of great toe since first of month

## 2013-12-04 NOTE — Discharge Instructions (Signed)
Thank you for coming in today. Start taking doxycycline tonight. Take twice daily for one week Start taking prednisone today. Take daily for 5 days. Start taking Ceftin tomorrow afternoon. Take twice daily for one Go to the emergency room if you do not get better quickly. Go to the emergency room if you get worse. Followup with your foot doctor soon.  Please quit smoking  Cellulitis Cellulitis is an infection of the skin and the tissue beneath it. The infected area is usually red and tender. Cellulitis occurs most often in the arms and lower legs.  CAUSES  Cellulitis is caused by bacteria that enter the skin through cracks or cuts in the skin. The most common types of bacteria that cause cellulitis are staphylococci and streptococci. SIGNS AND SYMPTOMS   Redness and warmth.  Swelling.  Tenderness or pain.  Fever. DIAGNOSIS  Your health care provider can usually determine what is wrong based on a physical exam. Blood tests may also be done. TREATMENT  Treatment usually involves taking an antibiotic medicine. HOME CARE INSTRUCTIONS   Take your antibiotic medicine as directed by your health care provider. Finish the antibiotic even if you start to feel better.  Keep the infected arm or leg elevated to reduce swelling.  Apply a warm cloth to the affected area up to 4 times per day to relieve pain.  Take medicines only as directed by your health care provider.  Keep all follow-up visits as directed by your health care provider. SEEK MEDICAL CARE IF:   You notice red streaks coming from the infected area.  Your red area gets larger or turns dark in color.  Your bone or joint underneath the infected area becomes painful after the skin has healed.  Your infection returns in the same area or another area.  You notice a swollen bump in the infected area.  You develop new symptoms.  You have a fever. SEEK IMMEDIATE MEDICAL CARE IF:   You feel very sleepy.  You develop  vomiting or diarrhea.  You have a general ill feeling (malaise) with muscle aches and pains. MAKE SURE YOU:   Understand these instructions.  Will watch your condition.  Will get help right away if you are not doing well or get worse. Document Released: 12/12/2004 Document Revised: 07/19/2013 Document Reviewed: 05/20/2011 [facility] Patient Information 2015 [place], [place]. This information is not intended to replace advice given to you by your health care provider. Make sure you discuss any questions you have with your health care provider.  Chronic Obstructive Pulmonary Disease Exacerbation Chronic obstructive pulmonary disease (COPD) is a common lung condition in which airflow from the lungs is limited. COPD is a general term that can be used to describe many different lung problems that limit airflow, including chronic bronchitis and emphysema. COPD exacerbations are episodes when breathing symptoms become much worse and require extra treatment. Without treatment, COPD exacerbations can be life threatening, and frequent COPD exacerbations can cause further damage to your lungs. CAUSES   Respiratory infections.   Exposure to smoke.   Exposure to air pollution, chemical fumes, or dust. Sometimes there is no apparent cause or trigger. RISK FACTORS  Smoking cigarettes.  Older age.  Frequent prior COPD exacerbations. SIGNS AND SYMPTOMS   Increased coughing.   Increased thick spit (sputum) production.   Increased wheezing.   Increased shortness of breath.   Rapid breathing.   Chest tightness. DIAGNOSIS  Your medical history, a physical exam, and tests will help your health care  provider make a diagnosis. Tests may include:  A chest X-ray.  Basic lab tests.  Sputum testing.  An arterial blood gas test. TREATMENT  Depending on the severity of your COPD exacerbation, you may need to be admitted to a hospital for treatment. Some of the treatments commonly used to  treat COPD exacerbations are:   Antibiotic medicines.   Bronchodilators. These are drugs that expand the air passages. They may be given with an inhaler or nebulizer. Spacer devices may be needed to help improve drug delivery.  Corticosteroid medicines.  Supplemental oxygen therapy.  HOME CARE INSTRUCTIONS   Do not smoke. Quitting smoking is very important to prevent COPD from getting worse and exacerbations from happening as often.  Avoid exposure to all substances that irritate the airway, especially to tobacco smoke.   If you were prescribed an antibiotic medicine, finish it all even if you start to feel better.  Take all medicines as directed by your health care provider.It is important to use correct technique with inhaled medicines.  Drink enough fluids to keep your urine clear or pale yellow (unless you have a medical condition that requires fluid restriction).  Use a cool mist vaporizer. This makes it easier to clear your chest when you cough.   If you have a home nebulizer and oxygen, continue to use them as directed.   Maintain all necessary vaccinations to prevent infections.   Exercise regularly.   Eat a healthy diet.   Keep all follow-up appointments as directed by your health care provider. SEEK IMMEDIATE MEDICAL CARE IF:  You have worsening shortness of breath.   You have trouble talking.   You have severe chest pain.  You have blood in your sputum.  You have a fever.  You have weakness, vomit repeatedly, or faint.   You feel confused.   You continue to get worse. MAKE SURE YOU:   Understand these instructions.  Will watch your condition.  Will get help right away if you are not doing well or get worse. Document Released: 12/30/2006 Document Revised: 07/19/2013 Document Reviewed: 11/06/2012 [facility] Patient Information 2015 [place], [place]. This information is not intended to replace advice given to you by your health care  provider. Make sure you discuss any questions you have with your health care provider.   Peripheral Vascular Disease Peripheral Vascular Disease (PVD), also called Peripheral Arterial Disease (PAD), is a circulation problem caused by cholesterol (atherosclerotic plaque) deposits in the arteries. PVD commonly occurs in the lower extremities (legs) but it can occur in other areas of the body, such as your arms. The cholesterol buildup in the arteries reduces blood flow which can cause pain and other serious problems. The presence of PVD can place a person at risk for Coronary Artery Disease (CAD).  CAUSES  Causes of PVD can be many. It is usually associated with more than one risk factor such as:   High Cholesterol.  Smoking.  Diabetes.  Lack of exercise or inactivity.  High blood pressure (hypertension).  Obesity.  Family history. SYMPTOMS   When the lower extremities are affected, patients with PVD may experience:  Leg pain with exertion or physical activity. This is called INTERMITTENT CLAUDICATION. This may present as cramping or numbness with physical activity. The location of the pain is associated with the level of blockage. For example, blockage at the abdominal level (distal abdominal aorta) may result in buttock or hip pain. Lower leg arterial blockage may result in calf pain.  As PVD becomes  more severe, pain can develop with less physical activity.  In people with severe PVD, leg pain may occur at rest.  Other PVD signs and symptoms:  Leg numbness or weakness.  Coldness in the affected leg or foot, especially when compared to the other leg.  A change in leg color.  Patients with significant PVD are more prone to ulcers or sores on toes, feet or legs. These may take longer to heal or may reoccur. The ulcers or sores can become infected.  If signs and symptoms of PVD are ignored, gangrene may occur. This can result in the loss of toes or loss of an entire limb.  Not  all leg pain is related to PVD. Other medical conditions can cause leg pain such as:  Blood clots (embolism) or Deep Vein Thrombosis.  Inflammation of the blood vessels (vasculitis).  Spinal stenosis. DIAGNOSIS  Diagnosis of PVD can involve several different types of tests. These can include:  Pulse Volume Recording Method (PVR). This test is simple, painless and does not involve the use of X-rays. PVR involves measuring and comparing the blood pressure in the arms and legs. An ABI (Ankle-Brachial Index) is calculated. The normal ratio of blood pressures is 1. As this number becomes smaller, it indicates more severe disease.  < 0.95 - indicates significant narrowing in one or more leg vessels.  <0.8 - there will usually be pain in the foot, leg or buttock with exercise.  <0.4 - will usually have pain in the legs at rest.  <0.25 - usually indicates limb threatening PVD.  Doppler detection of pulses in the legs. This test is painless and checks to see if you have a pulses in your legs/feet.  A dye or contrast material (a substance that highlights the blood vessels so they show up on x-ray) may be given to help your caregiver better see the arteries for the following tests. The dye is eliminated from your body by the kidney's. Your caregiver may order blood work to check your kidney function and other laboratory values before the following tests are performed:  Magnetic Resonance Angiography (MRA). An MRA is a picture study of the blood vessels and arteries. The MRA machine uses a large magnet to produce images of the blood vessels.  Computed Tomography Angiography (CTA). A CTA is a specialized x-ray that looks at how the blood flows in your blood vessels. An IV may be inserted into your arm so contrast dye can be injected.  Angiogram. Is a procedure that uses x-rays to look at your blood vessels. This procedure is minimally invasive, meaning a small incision (cut) is made in your groin. A  small tube (catheter) is then inserted into the artery of your groin. The catheter is guided to the blood vessel or artery your caregiver wants to examine. Contrast dye is injected into the catheter. X-rays are then taken of the blood vessel or artery. After the images are obtained, the catheter is taken out. TREATMENT  Treatment of PVD involves many interventions which may include:  Lifestyle changes:  Quitting smoking.  Exercise.  Following a low fat, low cholesterol diet.  Control of diabetes.  Foot care is very important to the PVD patient. Good foot care can help prevent infection.  Medication:  Cholesterol-lowering medicine.  Blood pressure medicine.  Anti-platelet drugs.  Certain medicines may reduce symptoms of Intermittent Claudication.  Interventional/Surgical options:  Angioplasty. An Angioplasty is a procedure that inflates a balloon in the blocked artery. This opens the  blocked artery to improve blood flow.  Stent Implant. A wire mesh tube (stent) is placed in the artery. The stent expands and stays in place, allowing the artery to remain open.  Peripheral Bypass Surgery. This is a surgical procedure that reroutes the blood around a blocked artery to help improve blood flow. This type of procedure may be performed if Angioplasty or stent implants are not an option. SEEK IMMEDIATE MEDICAL CARE IF:   You develop pain or numbness in your arms or legs.  Your arm or leg turns cold, becomes blue in color.  You develop redness, warmth, swelling and pain in your arms or legs. MAKE SURE YOU:   Understand these instructions.  Will watch your condition.  Will get help right away if you are not doing well or get worse. Document Released: 04/11/2004 Document Revised: 05/27/2011 Document Reviewed: 03/08/2008 [facility] Patient Information 2015 [place], [place]. This information is not intended to replace advice given to you by your health care provider. Make sure you  discuss any questions you have with your health care provider.

## 2013-12-07 ENCOUNTER — Telehealth: Payer: Self-pay | Admitting: Cardiovascular Disease

## 2013-12-07 NOTE — Telephone Encounter (Signed)
Can he come to [facility]?. I can see him on Thursday.

## 2013-12-07 NOTE — Telephone Encounter (Signed)
Spoke with patient who c/o ulcer under toe on right foot; patient went to Urgent Care on 9/19 (see encounter in Epic).  Patient states he is concerned and does not want toe to get worse Denies drainage; c/o redness and swelling, states it may be some better than when he went to Urgent Care   Patient has an appointment at [facility] with Dr. Fletcher Anon on 9/29 but would like to be seen sooner.  I advised patient I will send message to Dr. Fletcher Anon and will call him back with his advice.

## 2013-12-07 NOTE — Telephone Encounter (Signed)
New problem:    Pt he went to Urgent Care 9/19 with a fever and swollen right ankle and foot, pt had a cut under one of his toes.   Pt would like a question about what he should do.  Pt saw Dr Tommi Rumps.

## 2013-12-08 NOTE — Telephone Encounter (Signed)
Spoke with patient  He stated that he would rather wait until next week and just go to his [facility]  He stated that his foot is looking better  I instructed him to call with any concerns  Patient verbalized understanding

## 2013-12-14 ENCOUNTER — Ambulatory Visit (INDEPENDENT_AMBULATORY_CARE_PROVIDER_SITE_OTHER): Payer: Medicare Other | Admitting: Cardiovascular Disease

## 2013-12-14 ENCOUNTER — Encounter: Payer: Self-pay | Admitting: Cardiovascular Disease

## 2013-12-14 VITALS — BP 144/70 | HR 94 | Ht 75.0 in | Wt 174.0 lb

## 2013-12-14 DIAGNOSIS — I739 Peripheral vascular disease, unspecified: Secondary | ICD-10-CM

## 2013-12-14 DIAGNOSIS — I10 Essential (primary) hypertension: Secondary | ICD-10-CM

## 2013-12-14 LAB — CBC
HEMATOCRIT: 42.9 % (ref 39.0–52.0)
HEMOGLOBIN: 14 g/dL (ref 13.0–17.0)
MCHC: 32.6 g/dL (ref 30.0–36.0)
MCV: 92.2 fl (ref 78.0–100.0)
Platelets: 167 10*3/uL (ref 150.0–400.0)
RBC: 4.66 Mil/uL (ref 4.22–5.81)
RDW: 16.9 % — AB (ref 11.5–15.5)
WBC: 11.6 10*3/uL — AB (ref 4.0–10.5)

## 2013-12-14 LAB — BASIC METABOLIC PANEL
BUN: 16 mg/dL (ref 6–23)
CALCIUM: 9.1 mg/dL (ref 8.4–10.5)
CO2: 27 meq/L (ref 19–32)
CREATININE: 1 mg/dL (ref 0.4–1.5)
Chloride: 106 mEq/L (ref 96–112)
GFR: 81.63 mL/min (ref >60.00)
GLUCOSE: 86 mg/dL (ref 70–99)
Potassium: 4.1 mEq/L (ref 3.5–5.1)
Sodium: 138 mEq/L (ref 135–145)

## 2013-12-14 MED ORDER — CEPHALEXIN 500 MG PO CAPS: 500.0000 mg | ORAL_CAPSULE | Freq: Three times a day (TID) | ORAL | Status: DC

## 2013-12-14 NOTE — Patient Instructions (Signed)
Your physician recommends that you have lab work today: BMP and CBC  Your physician has requested that you have a RIGHT lower extremity arterial duplex. This test is an ultrasound of the arteries in the legs. It looks at arterial blood flow in the legs. Allow one hour for Lower Arterial scans. There are no restrictions or special instructions  Your physician has recommended you make the following change in your medication:  START Keflex 500mg  take one by mouth every 8 hours for 7 days  You have been referred to [place] for ulcer  Your physician wants you to follow-up in: 6 MONTHS with Dr Fletcher Anon.  You will receive a reminder letter in the mail two months in advance. If you don't receive a letter, please call our office to schedule the follow-up appointment.

## 2013-12-14 NOTE — Progress Notes (Signed)
HPI:  63 year old presenting for followup evaluation. The patient has lower extremity peripheral arterial disease with recurrent atheroembolic events to the left foot due to distal left popliteal artery stenosis with previous Cypher drug-eluting stent placement which was used in order to treat an ulcerated plaque with distal embolization. He had presentation and 2011 and was found to have distal edge restenosis extending into the ostium anterior tibial and tibioperoneal trunk. He had balloon angioplasty done with resolution of symptoms. He was recently seen in 11/2012  for a painless rash involving the left great toe and medial foot, with typical appearance of an athero embolic event. Echocardiogram was unremarkable.  CTA of the chest abdomen and pelvis with runoff to the feet demonstrated scattered atheromatous the aorta but no significant findings.  The left tibioperoneal stent had greater than 70% stenosis. ABI was normal but there was significant dampening in pressure noted in the big toe. I proceeded with angiography which confirmed this and also showed an occluded dorsalis pedis likely from embolization. I performed balloon angioplasty with an Angioscore balloon to both ostial anterior tibial and ostial tibioperoneal trunk with good results. Before the angiogram, the patient had worsening of black discoloration of tip of the big toe. After the procedure, the patient had resolution of symptoms. He is here for an ulceration on the bottom of the right big toe. This started about 4 weeks ago without trauma. There is mild pain. There was yellow discharge. He went to urgent care and was given an antibiotic with some improvement. No calf claudication. Recent ABI was normal although there was some restenosis in left popliteal artery stent.     Outpatient Encounter Prescriptions as of 12/14/2013  Medication Sig  . amLODipine (NORVASC) 10 MG tablet Take 5 mg by mouth daily.    Marland Kitchen aspirin EC 81 MG tablet Take  1 tablet (81 mg total) by mouth daily.  . cholecalciferol (VITAMIN D) 1000 UNITS tablet Take 1,000 Units by mouth daily.  . clopidogrel (PLAVIX) 75 MG tablet Take 1 tablet (75 mg total) by mouth daily.  . cyclobenzaprine (FLEXERIL) 10 MG tablet Take 1 tablet (10 mg total) by mouth at bedtime.  Marland Kitchen escitalopram (LEXAPRO) 10 MG tablet Take 1 tablet (10 mg total) by mouth 3 (three) times daily.  . finasteride (PROSCAR) 5 MG tablet Take 5 mg by mouth at bedtime.  . folic acid (FOLVITE) 1 MG tablet Take 1 mg by mouth daily.   Marland Kitchen gabapentin (NEURONTIN) 600 MG tablet Take 600 mg by mouth 3 (three) times daily.    . hydrOXYzine (ATARAX) 50 MG tablet Take 50 mg by mouth 4 (four) times daily.    Marland Kitchen levothyroxine (SYNTHROID, LEVOTHROID) 75 MCG tablet Take 1 tablet (75 mcg total) by mouth daily before breakfast.  . methotrexate (RHEUMATREX) 2.5 MG tablet Take 2.5 mg by mouth once a week. Caution:Chemotherapy. Protect from light. 5 tablets once a week on Friday   . mirtazapine (REMERON) 15 MG tablet Take 15 mg by mouth at bedtime.    . multivitamin (THERAGRAN) per tablet Take 1 tablet by mouth daily.    . Omega-3 Krill Oil 500 MG CAPS Take 1 capsule by mouth daily.  Marland Kitchen oxybutynin (DITROPAN) 5 MG tablet Take 5 mg by mouth at bedtime.  . pantoprazole (PROTONIX) 40 MG tablet Take 40 mg by mouth daily.    . pravastatin (PRAVACHOL) 40 MG tablet Take 40 mg by mouth at bedtime.    . prazosin (MINIPRESS) 2 MG  capsule Take 2 capsules (4 mg total) by mouth at bedtime.  . Thiamine HCl (VITAMIN B-1) 250 MG tablet Take 250 mg by mouth daily.    . vitamin E 600 UNIT capsule Take 600 Units by mouth daily.    . [DISCONTINUED] acetaminophen (TYLENOL) 500 MG tablet Take 500 mg by mouth every 6 (six) hours as needed.    . [DISCONTINUED] cefUROXime (CEFTIN) 500 MG tablet Take 1 tablet (500 mg total) by mouth 2 (two) times daily with a meal.  . [DISCONTINUED] doxycycline (VIBRA-TABS) 100 MG tablet Take 1 tablet (100 mg total) by  mouth 2 (two) times daily.  . [DISCONTINUED] doxycycline (VIBRAMYCIN) 100 MG capsule Take 1 capsule (100 mg total) by mouth 2 (two) times daily.  . [DISCONTINUED] predniSONE (DELTASONE) 10 MG tablet Take 3 tablets (30 mg total) by mouth daily.  . [DISCONTINUED] silver sulfADIAZINE (SILVADENE) 1 % cream Apply 1 application topically 2 (two) times daily.    Allergies  Allergen Reactions  . Neomycin-Bacitracin Zn-Polymyx     unknown  . Niacin     unknown  . Paroxetine     REACTION: goofy    Past Medical History  Diagnosis Date  . Peripheral vascular disease, unspecified     lower extremity peripheral artery disease  . Unspecified hypertensive kidney disease with chronic kidney disease stage I through stage IV, or unspecified     cholesterol embolization syndrome  . GERD (gastroesophageal reflux disease)   . Exposure to STD   . Dysfunction of eustachian tube   . Routine general medical examination at a health care facility   . Family history of psychiatric condition     family Hx of suicide   . Alcoholism in family   . Family history of diabetes mellitus   . Lipoprotein deficiencies   . Impotence of organic origin     erectile dysfunction  . Other diseases of lung, not elsewhere classified     pulmonary nodule  . Sprain of neck     Hx  . Diverticulosis of colon (without mention of hemorrhage)   . Depressive disorder, not elsewhere classified   . Personal history of colonic polyps   . Tobacco use disorder     ROS: Negative except as per HPI  BP 144/70  Pulse 94  Ht 6\' 3"  (1.905 m)  Wt 174 lb (78.926 kg)  BMI 21.75 kg/m2  PHYSICAL EXAM: Pt is alert and oriented, NAD HEENT: normal Neck: JVP - normal, carotids 2+= without bruits Lungs: CTA bilaterally CV: RRR without murmur or gallop Abd: soft, NT, Positive BS, no hepatomegaly Ext: no C/C/E. dorsalis pedis is  Not palpable bilaterally. Posterior tibial is palpable.  Skin: warm/dry no rash Small 2-3 mm ulcer on the  bottom of right big toe with mild clear discharge.  ------------------------------------------------------------    Assessment and plan:

## 2013-12-14 NOTE — Assessment & Plan Note (Signed)
He is asymptomatic on the left side where he had previous popliteal artery stent placement with recurrent procedures for in stent restneosis with embolization. Recent ABI was normal but there was some restenosis.  Currently, he has a slow healing ulcer on the bottom of the right big toe. He never had issues on the right side before. Posterior tibial pulse is normal but dorsalis pedis is not palpable. I requested right LE arterial duplex for evaluation.  Check labs today. I prescribed Keflex for another 1 week.  Refer to podiatry.

## 2013-12-15 ENCOUNTER — Other Ambulatory Visit (HOSPITAL_COMMUNITY): Payer: Self-pay | Admitting: Cardiology

## 2013-12-15 DIAGNOSIS — I739 Peripheral vascular disease, unspecified: Secondary | ICD-10-CM

## 2013-12-17 ENCOUNTER — Ambulatory Visit (HOSPITAL_COMMUNITY): Payer: Medicare Other | Attending: Internal Medicine | Admitting: Cardiology

## 2013-12-17 DIAGNOSIS — I10 Essential (primary) hypertension: Secondary | ICD-10-CM | POA: Insufficient documentation

## 2013-12-17 DIAGNOSIS — E785 Hyperlipidemia, unspecified: Secondary | ICD-10-CM | POA: Diagnosis not present

## 2013-12-17 DIAGNOSIS — Z720 Tobacco use: Secondary | ICD-10-CM | POA: Insufficient documentation

## 2013-12-17 DIAGNOSIS — L98499 Non-pressure chronic ulcer of skin of other sites with unspecified severity: Secondary | ICD-10-CM | POA: Diagnosis present

## 2013-12-17 DIAGNOSIS — I739 Peripheral vascular disease, unspecified: Secondary | ICD-10-CM | POA: Insufficient documentation

## 2013-12-17 DIAGNOSIS — I70235 Atherosclerosis of native arteries of right leg with ulceration of other part of foot: Secondary | ICD-10-CM

## 2013-12-17 NOTE — Progress Notes (Signed)
Arterial Duplex Lower Limited performed

## 2013-12-17 NOTE — Progress Notes (Signed)
Arterial Doppler Lower Limited performed

## 2013-12-20 ENCOUNTER — Telehealth: Payer: Self-pay | Admitting: Cardiovascular Disease

## 2013-12-20 NOTE — Telephone Encounter (Signed)
Left message on machine for pt to contact the office.   

## 2013-12-20 NOTE — Telephone Encounter (Signed)
New Message  Pt called to discuss medication and that he has a yeast infection in his mouth

## 2013-12-21 MED ORDER — FLUCONAZOLE 100 MG PO TABS: 100.0000 mg | ORAL_TABLET | Freq: Every day | ORAL | Status: DC

## 2013-12-21 NOTE — Telephone Encounter (Signed)
I spoke with the pt and he has developed a yeast infection in the groin area and also complains of a burning sensation in his mouth/throat .  The pt does have white patches on his throat. The pt has one more day of Keflex before completing this prescription.  I will forward this information to Dr Fletcher Anon to review.

## 2013-12-21 NOTE — Telephone Encounter (Signed)
Pt aware of medication instructions and Rx sent to the pharmacy.

## 2013-12-21 NOTE — Telephone Encounter (Signed)
Follow up  ° ° ° °Returning call back to nurse  °

## 2013-12-21 NOTE — Telephone Encounter (Signed)
Stop Keflex. Start Diflucan 100 mg once daily for 1 week.

## 2013-12-22 ENCOUNTER — Ambulatory Visit (INDEPENDENT_AMBULATORY_CARE_PROVIDER_SITE_OTHER): Payer: Medicare Other

## 2013-12-22 ENCOUNTER — Ambulatory Visit (INDEPENDENT_AMBULATORY_CARE_PROVIDER_SITE_OTHER): Payer: Medicare Other | Admitting: Podiatry

## 2013-12-22 ENCOUNTER — Encounter: Payer: Self-pay | Admitting: Podiatry

## 2013-12-22 VITALS — BP 141/81 | HR 74 | Resp 16

## 2013-12-22 DIAGNOSIS — L97511 Non-pressure chronic ulcer of other part of right foot limited to breakdown of skin: Secondary | ICD-10-CM

## 2013-12-22 NOTE — Progress Notes (Signed)
Subjective:     Patient ID: GER RINGENBERG, male   DOB: May 24, 1950, 63 y.o.   MRN: [id number]  HPI patient points to the right big toe stating that it's been open for about a month and I have not noticed drainage in it but I'm concerned because in the long-term diabetic   Review of Systems  All other systems reviewed and are negative.      Objective:   Physical Exam  Nursing note and vitals reviewed. Constitutional: He is oriented to person, place, and time.  Cardiovascular: Intact distal pulses.   Musculoskeletal: Normal range of motion.  Neurological: He is oriented to person, place, and time.  Skin: Skin is warm and dry.   neurovascular found to be diminished but intact with muscle strength adequate and range of motion subtalar midtarsal joint within normal limits. Toes are well-perfused and moderate depression of the arch height is noted. Patient is well oriented x3 and I noted on the plantar aspect of the right hallux there is a small open area measuring 2 x 2 mm with no proximal edema erythema or drainage noted     Assessment:     Small ulceration plantar aspect right heel localized in nature    Plan:     H&P and condition reviewed with patient and discussion of general diabetes footcare was commenced. Today I debrided the lesion and flushed and packed with Iodosorb under occlusion and patient will initiate soaks and will be seen back if this does not heal or if any redness drainage or swelling should occur

## 2013-12-22 NOTE — Progress Notes (Signed)
   Subjective:    Patient ID: David David, male    DOB: 05-Jan-1951, 63 y.o.   MRN: [id number]  HPI Comments: "I have an ulcer on this toe and the toenail just fell off one day"  Patient c/o tender plantar 1st toe right for about 1 month. There is an open area. Initially started as a little crack in the skin. It is better. He has seen a doc at urgent care. Rx'd meds and did doppler-brought results.  Also, patient concerned about the nail falling off and the knot dorsal toe.     Review of Systems  Constitutional: Positive for unexpected weight change.  Respiratory: Positive for cough.   Genitourinary: Positive for difficulty urinating.  Hematological: Bruises/bleeds easily.  All other systems reviewed and are negative.      Objective:   Physical Exam        Assessment & Plan:

## 2013-12-29 ENCOUNTER — Ambulatory Visit: Payer: Medicare Other | Admitting: Podiatry

## 2013-12-31 ENCOUNTER — Other Ambulatory Visit: Payer: Self-pay

## 2014-01-30 ENCOUNTER — Emergency Department (HOSPITAL_BASED_OUTPATIENT_CLINIC_OR_DEPARTMENT_OTHER): Payer: Medicare Other

## 2014-01-30 ENCOUNTER — Emergency Department (HOSPITAL_BASED_OUTPATIENT_CLINIC_OR_DEPARTMENT_OTHER)
Admission: EM | Admit: 2014-01-30 | Discharge: 2014-01-30 | Disposition: A | Discharge status: Home / Self Care | Payer: Medicare Other | Attending: Emergency Medicine | Admitting: Emergency Medicine

## 2014-01-30 ENCOUNTER — Encounter (HOSPITAL_BASED_OUTPATIENT_CLINIC_OR_DEPARTMENT_OTHER): Payer: Self-pay

## 2014-01-30 DIAGNOSIS — K219 Gastro-esophageal reflux disease without esophagitis: Secondary | ICD-10-CM | POA: Diagnosis not present

## 2014-01-30 DIAGNOSIS — Z2009 Contact with and (suspected) exposure to other intestinal infectious diseases: Secondary | ICD-10-CM | POA: Insufficient documentation

## 2014-01-30 DIAGNOSIS — Z87828 Personal history of other (healed) physical injury and trauma: Secondary | ICD-10-CM | POA: Diagnosis not present

## 2014-01-30 DIAGNOSIS — N189 Chronic kidney disease, unspecified: Secondary | ICD-10-CM | POA: Insufficient documentation

## 2014-01-30 DIAGNOSIS — Z8639 Personal history of other endocrine, nutritional and metabolic disease: Secondary | ICD-10-CM | POA: Diagnosis not present

## 2014-01-30 DIAGNOSIS — Z7982 Long term (current) use of aspirin: Secondary | ICD-10-CM | POA: Diagnosis not present

## 2014-01-30 DIAGNOSIS — Z7902 Long term (current) use of antithrombotics/antiplatelets: Secondary | ICD-10-CM | POA: Insufficient documentation

## 2014-01-30 DIAGNOSIS — I129 Hypertensive chronic kidney disease with stage 1 through stage 4 chronic kidney disease, or unspecified chronic kidney disease: Secondary | ICD-10-CM | POA: Insufficient documentation

## 2014-01-30 DIAGNOSIS — Z8669 Personal history of other diseases of the nervous system and sense organs: Secondary | ICD-10-CM | POA: Insufficient documentation

## 2014-01-30 DIAGNOSIS — R109 Unspecified abdominal pain: Secondary | ICD-10-CM | POA: Diagnosis present

## 2014-01-30 DIAGNOSIS — K5733 Diverticulitis of large intestine without perforation or abscess with bleeding: Secondary | ICD-10-CM | POA: Insufficient documentation

## 2014-01-30 DIAGNOSIS — Z86010 Personal history of colonic polyps: Secondary | ICD-10-CM | POA: Insufficient documentation

## 2014-01-30 DIAGNOSIS — Z87891 Personal history of nicotine dependence: Secondary | ICD-10-CM | POA: Diagnosis not present

## 2014-01-30 DIAGNOSIS — Z79899 Other long term (current) drug therapy: Secondary | ICD-10-CM | POA: Insufficient documentation

## 2014-01-30 DIAGNOSIS — F329 Major depressive disorder, single episode, unspecified: Secondary | ICD-10-CM | POA: Diagnosis not present

## 2014-01-30 DIAGNOSIS — Z8709 Personal history of other diseases of the respiratory system: Secondary | ICD-10-CM | POA: Insufficient documentation

## 2014-01-30 LAB — COMPREHENSIVE METABOLIC PANEL
ALBUMIN: 3.9 g/dL (ref 3.5–5.2)
ALT: 16 U/L (ref 0–53)
AST: 28 U/L (ref 0–37)
Alkaline Phosphatase: 111 U/L (ref 39–117)
Anion gap: 13 (ref 5–15)
BUN: 12 mg/dL (ref 6–23)
CO2: 24 mEq/L (ref 19–32)
Calcium: 9.3 mg/dL (ref 8.4–10.5)
Chloride: 104 mEq/L (ref 96–112)
Creatinine, Ser: 0.9 mg/dL (ref 0.50–1.35)
GFR calc non Af Amer: 88 mL/min — ABNORMAL LOW (ref >90)
GLUCOSE: 80 mg/dL (ref 70–99)
POTASSIUM: 3.8 meq/L (ref 3.7–5.3)
SODIUM: 141 meq/L (ref 137–147)
TOTAL PROTEIN: 7.4 g/dL (ref 6.0–8.3)
Total Bilirubin: 0.3 mg/dL (ref 0.3–1.2)

## 2014-01-30 LAB — URINE MICROSCOPIC-ADD ON

## 2014-01-30 LAB — CBC WITH DIFFERENTIAL/PLATELET
BASOS ABS: 0 10*3/uL (ref 0.0–0.1)
Basophils Relative: 0 % (ref 0–1)
EOS ABS: 0 10*3/uL (ref 0.0–0.7)
Eosinophils Relative: 0 % (ref 0–5)
HCT: 41.2 % (ref 39.0–52.0)
Hemoglobin: 13.6 g/dL (ref 13.0–17.0)
Lymphocytes Relative: 21 % (ref 12–46)
Lymphs Abs: 2.5 10*3/uL (ref 0.7–4.0)
MCH: 30.6 pg (ref 26.0–34.0)
MCHC: 33 g/dL (ref 30.0–36.0)
MCV: 92.8 fL (ref 78.0–100.0)
Monocytes Absolute: 0.5 10*3/uL (ref 0.1–1.0)
Monocytes Relative: 4 % (ref 3–12)
NEUTROS PCT: 75 % (ref 43–77)
Neutro Abs: 9.1 10*3/uL — ABNORMAL HIGH (ref 1.7–7.7)
PLATELETS: 196 10*3/uL (ref 150–400)
RBC: 4.44 MIL/uL (ref 4.22–5.81)
RDW: 15.6 % — ABNORMAL HIGH (ref 11.5–15.5)
WBC: 12.1 10*3/uL — ABNORMAL HIGH (ref 4.0–10.5)

## 2014-01-30 LAB — URINALYSIS, ROUTINE W REFLEX MICROSCOPIC
Bilirubin Urine: NEGATIVE
GLUCOSE, UA: NEGATIVE mg/dL
Hgb urine dipstick: NEGATIVE
Ketones, ur: NEGATIVE mg/dL
Nitrite: NEGATIVE
PROTEIN: NEGATIVE mg/dL
SPECIFIC GRAVITY, URINE: 1.019 (ref 1.005–1.030)
Urobilinogen, UA: 1 mg/dL (ref 0.0–1.0)
pH: 6.5 (ref 5.0–8.0)

## 2014-01-30 LAB — OCCULT BLOOD X 1 CARD TO LAB, STOOL: Fecal Occult Bld: POSITIVE — AB

## 2014-01-30 LAB — LIPASE, BLOOD: Lipase: 37 U/L (ref 11–59)

## 2014-01-30 MED ORDER — IOHEXOL 300 MG/ML  SOLN
100.0000 mL | Freq: Once | INTRAMUSCULAR | Status: AC | PRN
  Administered 2014-01-30: 100 mL via INTRAVENOUS

## 2014-01-30 MED ORDER — CIPROFLOXACIN HCL 500 MG PO TABS: 500.0000 mg | ORAL_TABLET | Freq: Two times a day (BID) | ORAL | Status: DC

## 2014-01-30 MED ORDER — METRONIDAZOLE 500 MG PO TABS: 500.0000 mg | ORAL_TABLET | Freq: Two times a day (BID) | ORAL | Status: DC

## 2014-01-30 MED ORDER — IOHEXOL 300 MG/ML  SOLN
50.0000 mL | Freq: Once | INTRAMUSCULAR | Status: AC | PRN
  Administered 2014-01-30: 50 mL via ORAL

## 2014-01-30 MED ORDER — ONDANSETRON 8 MG PO TBDP: ORAL_TABLET | ORAL | Status: DC

## 2014-01-30 MED ORDER — HYDROCODONE-ACETAMINOPHEN 5-325 MG PO TABS: 1.0000 | ORAL_TABLET | ORAL | Status: DC | PRN

## 2014-01-30 NOTE — ED Provider Notes (Signed)
CSN: [id number]     Arrival date & time 01/30/14  1821 History  This chart was scribed for Malvin Johns, MD by Peyton Bottoms, ED Scribe. This patient was seen in room MH06/MH06 and the patient's care was started at 8:14 PM.   Chief Complaint  Patient presents with  . Abdominal Pain   Patient is a 63 y.o. male presenting with abdominal pain. The history is provided by the patient. No language interpreter was used.  Abdominal Pain Associated symptoms: nausea and vomiting   Associated symptoms: no chest pain, no chills, no cough, no diarrhea, no fatigue, no fever, no hematuria and no shortness of breath    HPI Comments: CARRIE SCHOONMAKER is a 63 y.o. male who presents to the Emergency Department complaining of lower abdominal pain that began this morning when he woke up. Patient states that his pain woke him up from sleep early this morning. He states that he felt like he was going to have a bowel movement. Patient went to use the toilet and had 1 episode of emesis. He states vomit was of tan color and had sputum consistency. He had another episode of emesis a couple hours later. He states that he felt the need to have a bowel movement again at 2:30 PM today and states an episode of hematochezia. He states that it looked like "a big chunk of blood". He has had no further episodes of bleeding. He also reports a cough for last couple of months.  Patient states that he currently still feels nauseous. He denies associated fever. He states that he had a colonoscopy done last year done by the [facility].  Past Medical History  Diagnosis Date  . Peripheral vascular disease, unspecified     lower extremity peripheral artery disease  . Unspecified hypertensive kidney disease with chronic kidney disease stage I through stage IV, or unspecified     cholesterol embolization syndrome  . GERD (gastroesophageal reflux disease)   . Exposure to STD   . Dysfunction of eustachian tube   . Routine general  medical examination at a health care facility   . Family history of psychiatric condition     family Hx of suicide   . Alcoholism in family   . Family history of diabetes mellitus   . Lipoprotein deficiencies   . Impotence of organic origin     erectile dysfunction  . Other diseases of lung, not elsewhere classified     pulmonary nodule  . Sprain of neck     Hx  . Diverticulosis of colon (without mention of hemorrhage)   . Depressive disorder, not elsewhere classified   . Personal history of colonic polyps   . Tobacco use disorder    Past Surgical History  Procedure Laterality Date  . Angiography      bilateral lower extremity angiography, stenting of the left.   . Popliteal artery angioplasty      didnt specify angioplasty or stent  . Spermatocelectomy      left   No family history on file. History  Substance Use Topics  . Smoking status: Former Research scientist (life sciences)  . Smokeless tobacco: Not on file     Comment: QUIT 2009  . Alcohol Use: No     Comment: denies alcohol use. there is a history of heavy alcohol use    Review of Systems  Constitutional: Negative for fever, chills, diaphoresis and fatigue.  HENT: Negative for congestion, rhinorrhea and sneezing.   Eyes: Negative.   Respiratory:  Negative for cough, chest tightness and shortness of breath.   Cardiovascular: Negative for chest pain and leg swelling.  Gastrointestinal: Positive for nausea, vomiting, abdominal pain and blood in stool. Negative for diarrhea.  Genitourinary: Negative for frequency, hematuria, flank pain and difficulty urinating.  Musculoskeletal: Negative for back pain and arthralgias.  Skin: Negative for rash.  Neurological: Negative for dizziness, speech difficulty, weakness, numbness and headaches.   Allergies  Neomycin-bacitracin zn-polymyx; Niacin; and Paroxetine  Home Medications   Prior to Admission medications   Medication Sig Start Date End Date Taking? Authorizing Provider  amLODipine (NORVASC)  10 MG tablet Take 5 mg by mouth daily.      Historical Provider, MD  aspirin EC 81 MG tablet Take 1 tablet (81 mg total) by mouth daily. 10/09/12   Sherren Mocha, MD  cholecalciferol (VITAMIN D) 1000 UNITS tablet Take 1,000 Units by mouth daily.    Historical Provider, MD  ciprofloxacin (CIPRO) 500 MG tablet Take 1 tablet (500 mg total) by mouth 2 (two) times daily. One po bid x 7 days 01/30/14   Malvin Johns, MD  clopidogrel (PLAVIX) 75 MG tablet Take 1 tablet (75 mg total) by mouth daily. 11/04/12   Wellington Hampshire, MD  cyclobenzaprine (FLEXERIL) 10 MG tablet Take 1 tablet (10 mg total) by mouth at bedtime. 10/28/12   Sherren Mocha, MD  escitalopram (LEXAPRO) 10 MG tablet Take 1 tablet (10 mg total) by mouth 3 (three) times daily. 10/28/12   Sherren Mocha, MD  finasteride (PROSCAR) 5 MG tablet Take 5 mg by mouth at bedtime.    Historical Provider, MD  fluconazole (DIFLUCAN) 100 MG tablet Take 1 tablet (100 mg total) by mouth daily. 12/21/13   Wellington Hampshire, MD  folic acid (FOLVITE) 1 MG tablet Take 1 mg by mouth daily.     Historical Provider, MD  gabapentin (NEURONTIN) 600 MG tablet Take 600 mg by mouth 3 (three) times daily.      Historical Provider, MD  HYDROcodone-acetaminophen (NORCO/VICODIN) 5-325 MG per tablet Take 1-2 tablets by mouth every 4 (four) hours as needed. 01/30/14   Malvin Johns, MD  hydrOXYzine (ATARAX) 50 MG tablet Take 50 mg by mouth 4 (four) times daily.      Historical Provider, MD  levothyroxine (SYNTHROID, LEVOTHROID) 75 MCG tablet Take 1 tablet (75 mcg total) by mouth daily before breakfast. 10/28/12   Sherren Mocha, MD  methotrexate (RHEUMATREX) 2.5 MG tablet Take 2.5 mg by mouth once a week. Caution:Chemotherapy. Protect from light. 5 tablets once a week on Friday     Historical Provider, MD  metroNIDAZOLE (FLAGYL) 500 MG tablet Take 1 tablet (500 mg total) by mouth 2 (two) times daily. One po bid x 7 days 01/30/14   Malvin Johns, MD  mirtazapine (REMERON) 15 MG  tablet Take 15 mg by mouth at bedtime.      Historical Provider, MD  multivitamin [facility]) per tablet Take 1 tablet by mouth daily.      Historical Provider, MD  Omega-3 Krill Oil 500 MG CAPS Take 1 capsule by mouth daily.    Historical Provider, MD  ondansetron (ZOFRAN ODT) 8 MG disintegrating tablet 8mg  ODT q4 hours prn nausea 01/30/14   Malvin Johns, MD  oxybutynin (DITROPAN) 5 MG tablet Take 5 mg by mouth at bedtime.    Historical Provider, MD  pantoprazole (PROTONIX) 40 MG tablet Take 40 mg by mouth daily.      Historical Provider, MD  pravastatin (PRAVACHOL) 40 MG tablet Take  40 mg by mouth at bedtime.      Historical Provider, MD  prazosin (MINIPRESS) 2 MG capsule Take 2 capsules (4 mg total) by mouth at bedtime. 10/29/12   Sherren Mocha, MD  Thiamine HCl (VITAMIN B-1) 250 MG tablet Take 250 mg by mouth daily.      Historical Provider, MD  vitamin E 600 UNIT capsule Take 600 Units by mouth daily.      Historical Provider, MD   Triage Vitals: BP 168/82 mmHg  Pulse 86  Temp(Src) 98.8 F (37.1 C) (Oral)  Resp 20  Wt 175 lb (79.379 kg)  SpO2 97%  Physical Exam  Constitutional: He is oriented to person, place, and time. He appears well-developed and well-nourished.  HENT:  Head: Normocephalic and atraumatic.  Eyes: Pupils are equal, round, and reactive to light.  Neck: Normal range of motion. Neck supple.  Cardiovascular: Normal rate, regular rhythm and normal heart sounds.   Pulmonary/Chest: Effort normal and breath sounds normal. No respiratory distress. He has no wheezes. He has no rales. He exhibits no tenderness.  Abdominal: Soft. Bowel sounds are normal. There is tenderness. There is no rebound and no guarding.  Mild tenderness across lower abdomen and across left mid-abdomen.  Genitourinary:  No gross blood on rectal exam  Musculoskeletal: Normal range of motion. He exhibits no edema.  Lymphadenopathy:    He has no cervical adenopathy.  Neurological: He is alert and  oriented to person, place, and time.  Skin: Skin is warm and dry. No rash noted.  Psychiatric: He has a normal mood and affect.  Nursing note and vitals reviewed.  ED Course  Procedures (including critical care time)  DIAGNOSTIC STUDIES: Oxygen Saturation is 97% on RA, normal by my interpretation.    COORDINATION OF CARE: 8:23 PM- Discussed plans to perform rectal exam. Pt advised of plan for treatment and pt agrees.  Labs Review Results for orders placed or performed during the hospital encounter of 01/30/14  CBC with Differential  Result Value Ref Range   WBC 12.1 (H) 4.0 - 10.5 K/uL   RBC 4.44 4.22 - 5.81 MIL/uL   Hemoglobin 13.6 13.0 - 17.0 g/dL   HCT 41.2 39.0 - 52.0 %   MCV 92.8 78.0 - 100.0 fL   MCH 30.6 26.0 - 34.0 pg   MCHC 33.0 30.0 - 36.0 g/dL   RDW 15.6 (H) 11.5 - 15.5 %   Platelets 196 150 - 400 K/uL   Neutrophils Relative % 75 43 - 77 %   Neutro Abs 9.1 (H) 1.7 - 7.7 K/uL   Lymphocytes Relative 21 12 - 46 %   Lymphs Abs 2.5 0.7 - 4.0 K/uL   Monocytes Relative 4 3 - 12 %   Monocytes Absolute 0.5 0.1 - 1.0 K/uL   Eosinophils Relative 0 0 - 5 %   Eosinophils Absolute 0.0 0.0 - 0.7 K/uL   Basophils Relative 0 0 - 1 %   Basophils Absolute 0.0 0.0 - 0.1 K/uL  Comprehensive metabolic panel  Result Value Ref Range   Sodium 141 137 - 147 mEq/L   Potassium 3.8 3.7 - 5.3 mEq/L   Chloride 104 96 - 112 mEq/L   CO2 24 19 - 32 mEq/L   Glucose, Bld 80 70 - 99 mg/dL   BUN 12 6 - 23 mg/dL   Creatinine, Ser 0.90 0.50 - 1.35 mg/dL   Calcium 9.3 8.4 - 10.5 mg/dL   Total Protein 7.4 6.0 - 8.3 g/dL   Albumin  3.9 3.5 - 5.2 g/dL   AST 28 0 - 37 U/L   ALT 16 0 - 53 U/L   Alkaline Phosphatase 111 39 - 117 U/L   Total Bilirubin 0.3 0.3 - 1.2 mg/dL   GFR calc non Af Amer 88 (L) >90 mL/min   GFR calc Af Amer >90 >90 mL/min   Anion gap 13 5 - 15  Lipase, blood  Result Value Ref Range   Lipase 37 11 - 59 U/L  Urinalysis, Routine w reflex microscopic  Result Value Ref Range    Color, Urine YELLOW YELLOW   APPearance CLEAR CLEAR   Specific Gravity, Urine 1.019 1.005 - 1.030   pH 6.5 5.0 - 8.0   Glucose, UA NEGATIVE NEGATIVE mg/dL   Hgb urine dipstick NEGATIVE NEGATIVE   Bilirubin Urine NEGATIVE NEGATIVE   Ketones, ur NEGATIVE NEGATIVE mg/dL   Protein, ur NEGATIVE NEGATIVE mg/dL   Urobilinogen, UA 1.0 0.0 - 1.0 mg/dL   Nitrite NEGATIVE NEGATIVE   Leukocytes, UA SMALL (A) NEGATIVE  Occult blood card to lab, stool Provider will collect  Result Value Ref Range   Fecal Occult Bld POSITIVE (A) NEGATIVE  Urine microscopic-add on  Result Value Ref Range   Squamous Epithelial / LPF RARE RARE   WBC, UA 0-2 <3 WBC/hpf   Bacteria, UA RARE RARE   Urine-Other MUCOUS PRESENT    Dg Chest 2 View  01/30/2014   CLINICAL DATA:  Lower abdominal pain beginning this morning. Emesis. Back pain for a month. Cough. Nausea.  EXAM: CHEST  2 VIEW  COMPARISON:  12/31/2009  FINDINGS: Mild hyperinflation. The heart size and mediastinal contours are within normal limits. Both lungs are clear. The visualized skeletal structures are unremarkable.  IMPRESSION: No active cardiopulmonary disease.   Electronically Signed   By: Lucienne Capers M.D.   On: 01/30/2014 22:36   Ct Abdomen Pelvis W Contrast  01/30/2014   CLINICAL DATA:  Low abdominal pain for 1 day  EXAM: CT ABDOMEN AND PELVIS WITH CONTRAST  TECHNIQUE: Multidetector CT imaging of the abdomen and pelvis was performed using the standard protocol following bolus administration of intravenous contrast.  CONTRAST:  33mL OMNIPAQUE IOHEXOL 300 MG/ML SOLN, 134mL OMNIPAQUE IOHEXOL 300 MG/ML SOLN  COMPARISON:  None.  FINDINGS: Lung bases demonstrate some mild emphysematous changes. No focal infiltrate or sizable effusion is seen.  The liver, gallbladder, spleen, adrenal glands and pancreas are normal in their CT appearance.  The kidneys are well visualized bilaterally and reveal a normal enhancement pattern. No renal mass lesion is seen. No  calculi or obstructive changes are noted. A left retroaortic renal vein is noted. Diffuse aortoiliac calcifications are seen without aneurysmal dilatation.  The appendix is not well visualized. No right lower quadrant inflammatory changes are identified to suggest appendicitis. The bladder is well distended shows a small calcification within likely related to a small bladder calculus or previously passed stone. There is mild wall thickening and mild pericolonic inflammatory changes at the junction of the descending and sigmoid colons suggestive of very early diverticulitis. No abscess or perforation is seen. No pelvic mass lesion is noted. Bony structures show degenerative change of thoracic and lumbar spine. Bilateral pars defects are noted L5 with mild anterolisthesis of L5 on S1.  IMPRESSION: Changes consistent with very early diverticulitis in the descending and sigmoid colon.  46mm Calculus within the bladder.  No complicating factors are noted.   Electronically Signed   By: Inez Catalina M.D.   On:  01/30/2014 22:44     Imaging Review Dg Chest 2 View  01/30/2014   CLINICAL DATA:  Lower abdominal pain beginning this morning. Emesis. Back pain for a month. Cough. Nausea.  EXAM: CHEST  2 VIEW  COMPARISON:  12/31/2009  FINDINGS: Mild hyperinflation. The heart size and mediastinal contours are within normal limits. Both lungs are clear. The visualized skeletal structures are unremarkable.  IMPRESSION: No active cardiopulmonary disease.   Electronically Signed   By: Lucienne Capers M.D.   On: 01/30/2014 22:36   Ct Abdomen Pelvis W Contrast  01/30/2014   CLINICAL DATA:  Low abdominal pain for 1 day  EXAM: CT ABDOMEN AND PELVIS WITH CONTRAST  TECHNIQUE: Multidetector CT imaging of the abdomen and pelvis was performed using the standard protocol following bolus administration of intravenous contrast.  CONTRAST:  23mL OMNIPAQUE IOHEXOL 300 MG/ML SOLN, 160mL OMNIPAQUE IOHEXOL 300 MG/ML SOLN  COMPARISON:  None.   FINDINGS: Lung bases demonstrate some mild emphysematous changes. No focal infiltrate or sizable effusion is seen.  The liver, gallbladder, spleen, adrenal glands and pancreas are normal in their CT appearance.  The kidneys are well visualized bilaterally and reveal a normal enhancement pattern. No renal mass lesion is seen. No calculi or obstructive changes are noted. A left retroaortic renal vein is noted. Diffuse aortoiliac calcifications are seen without aneurysmal dilatation.  The appendix is not well visualized. No right lower quadrant inflammatory changes are identified to suggest appendicitis. The bladder is well distended shows a small calcification within likely related to a small bladder calculus or previously passed stone. There is mild wall thickening and mild pericolonic inflammatory changes at the junction of the descending and sigmoid colons suggestive of very early diverticulitis. No abscess or perforation is seen. No pelvic mass lesion is noted. Bony structures show degenerative change of thoracic and lumbar spine. Bilateral pars defects are noted L5 with mild anterolisthesis of L5 on S1.  IMPRESSION: Changes consistent with very early diverticulitis in the descending and sigmoid colon.  45mm Calculus within the bladder.  No complicating factors are noted.   Electronically Signed   By: Inez Catalina M.D.   On: 01/30/2014 22:44     EKG Interpretation None     MDM   Final diagnoses:  Diverticulitis of large intestine without perforation or abscess with bleeding    Patient diverticulitis. He is well appearing and afebrile. He's had no vomiting in the ED. He's had no further episodes of rectal bleeding. His hemoglobin is stable. I feel that he can have an outpatient trial for treatment. He was given a prescription for Cipro and Flagyl as well as  Vicodin and Zofran for symptomatically. He was encouraged to make a follow-up appointment with his gastroenterologist. He was given strict return  precautions.  I personally performed the services described in this documentation, which was scribed in my presence. The recorded information has been reviewed and is accurate.  Malvin Johns, MD 01/30/14 [phone]

## 2014-01-30 NOTE — ED Notes (Signed)
Patient reports that he was awakened with lower abdominal pain, has had 2 episodes of vomiting and has 1 bowel movement that he describes has lots of frank blood in toliet.

## 2014-01-30 NOTE — Discharge Instructions (Signed)

## 2014-02-24 ENCOUNTER — Encounter (HOSPITAL_COMMUNITY): Payer: Self-pay | Admitting: Cardiovascular Disease

## 2014-03-18 HISTORY — PX: EYE SURGERY: SHX253

## 2014-06-07 ENCOUNTER — Encounter: Payer: Self-pay | Admitting: Cardiovascular Disease

## 2014-07-18 ENCOUNTER — Observation Stay (HOSPITAL_COMMUNITY)
Admission: EM | Admit: 2014-07-18 | Discharge: 2014-07-20 | Disposition: A | Discharge status: Home / Self Care | Payer: Medicare Other | Attending: Internal Medicine | Admitting: Internal Medicine

## 2014-07-18 ENCOUNTER — Emergency Department (HOSPITAL_COMMUNITY): Payer: Medicare Other

## 2014-07-18 ENCOUNTER — Encounter (HOSPITAL_COMMUNITY): Payer: Self-pay | Admitting: Emergency Medicine

## 2014-07-18 DIAGNOSIS — E785 Hyperlipidemia, unspecified: Secondary | ICD-10-CM | POA: Diagnosis not present

## 2014-07-18 DIAGNOSIS — R072 Precordial pain: Secondary | ICD-10-CM | POA: Diagnosis not present

## 2014-07-18 DIAGNOSIS — M069 Rheumatoid arthritis, unspecified: Secondary | ICD-10-CM | POA: Diagnosis not present

## 2014-07-18 DIAGNOSIS — K219 Gastro-esophageal reflux disease without esophagitis: Secondary | ICD-10-CM | POA: Diagnosis not present

## 2014-07-18 DIAGNOSIS — F17210 Nicotine dependence, cigarettes, uncomplicated: Secondary | ICD-10-CM | POA: Insufficient documentation

## 2014-07-18 DIAGNOSIS — R5383 Other fatigue: Secondary | ICD-10-CM | POA: Diagnosis not present

## 2014-07-18 DIAGNOSIS — N400 Enlarged prostate without lower urinary tract symptoms: Secondary | ICD-10-CM | POA: Diagnosis not present

## 2014-07-18 DIAGNOSIS — Z7982 Long term (current) use of aspirin: Secondary | ICD-10-CM | POA: Insufficient documentation

## 2014-07-18 DIAGNOSIS — I25110 Atherosclerotic heart disease of native coronary artery with unstable angina pectoris: Principal | ICD-10-CM | POA: Insufficient documentation

## 2014-07-18 DIAGNOSIS — I10 Essential (primary) hypertension: Secondary | ICD-10-CM | POA: Diagnosis not present

## 2014-07-18 DIAGNOSIS — I739 Peripheral vascular disease, unspecified: Secondary | ICD-10-CM | POA: Diagnosis not present

## 2014-07-18 DIAGNOSIS — E039 Hypothyroidism, unspecified: Secondary | ICD-10-CM | POA: Diagnosis present

## 2014-07-18 DIAGNOSIS — R079 Chest pain, unspecified: Secondary | ICD-10-CM | POA: Diagnosis not present

## 2014-07-18 DIAGNOSIS — I200 Unstable angina: Secondary | ICD-10-CM | POA: Insufficient documentation

## 2014-07-18 DIAGNOSIS — E440 Moderate protein-calorie malnutrition: Secondary | ICD-10-CM | POA: Insufficient documentation

## 2014-07-18 LAB — CBC
HCT: 41.4 % (ref 39.0–52.0)
HEMOGLOBIN: 13.5 g/dL (ref 13.0–17.0)
MCH: 30.1 pg (ref 26.0–34.0)
MCHC: 32.6 g/dL (ref 30.0–36.0)
MCV: 92.2 fL (ref 78.0–100.0)
PLATELETS: 231 10*3/uL (ref 150–400)
RBC: 4.49 MIL/uL (ref 4.22–5.81)
RDW: 14.8 % (ref 11.5–15.5)
WBC: 8.4 10*3/uL (ref 4.0–10.5)

## 2014-07-18 LAB — BASIC METABOLIC PANEL
Anion gap: 9 (ref 5–15)
BUN: 9 mg/dL (ref 6–20)
CHLORIDE: 107 mmol/L (ref 101–111)
CO2: 23 mmol/L (ref 22–32)
Calcium: 9.2 mg/dL (ref 8.9–10.3)
Creatinine, Ser: 0.94 mg/dL (ref 0.61–1.24)
GFR calc Af Amer: >60 mL/min (ref >60)
GFR calc non Af Amer: >60 mL/min (ref >60)
Glucose, Bld: 122 mg/dL — ABNORMAL HIGH (ref 70–99)
Potassium: 3.6 mmol/L (ref 3.5–5.1)
Sodium: 139 mmol/L (ref 135–145)

## 2014-07-18 LAB — I-STAT TROPONIN, ED: Troponin i, poc: 0 ng/mL (ref 0.00–0.08)

## 2014-07-18 LAB — BRAIN NATRIURETIC PEPTIDE: B Natriuretic Peptide: 29.2 pg/mL (ref 0.0–100.0)

## 2014-07-18 NOTE — ED Notes (Signed)
Pt. reports intermittent central chest pain onset yesterday with SOB and " belching " , denies nausea or diaphoresis .

## 2014-07-18 NOTE — ED Provider Notes (Signed)
CSN: [id number]     Arrival date & time 07/18/14  2019 History  This chart was scribed for Ripley Fraise, MD by Rayfield Citizen, ED Scribe. This patient was seen in room A03C/A03C and the patient's care was started at 12:01 AM.    Chief Complaint  Patient presents with  . Chest Pain   Patient is a 64 y.o. male presenting with chest pain. The history is provided by the patient. No language interpreter was used.  Chest Pain Pain location:  Substernal area and L chest Pain quality: pressure   Pain radiates to:  L arm Pain radiates to the back: no   Pain severity:  Moderate Onset quality:  Gradual Duration:  2 days Timing:  Intermittent Progression:  Unchanged Chronicity:  New Context: no trauma   Relieved by:  Nothing Exacerbated by: Palpation. Ineffective treatments:  None tried Associated symptoms: fatigue and headache   Associated symptoms: no abdominal pain, no diaphoresis, no lower extremity edema, no nausea and not vomiting   Risk factors: no prior DVT/PE      HPI Comments: David David is a 64 y.o. male who presents to the Emergency Department complaining of 2 days of intermittent substernal and left-sided chest pain, described as "pressure", currently rated 5-6/10. Patient also reports mild headache, restlessness, fatigue and an aching discomfort in his left arm last night that has resolved at present. He is unable to specify the length of the episodes of pain; he does not know what instigates this pain but it is exacerbated by applied pressure. Nothing improves his pain. He denies diaphoresis, nausea, vomiting, abdominal pain, extremity swelling. He denies prior MI or stroke, denies prior blood clots in the legs or lungs.   Patient is currently on Plavix for his popliteal stent (history of PVD); he also takes aspirin daily. PCP at the [facility], vascular care provided by Dr. Fletcher Anon. No prior cardiac cath.   Past Medical History  Diagnosis Date  . Peripheral vascular disease,  unspecified     lower extremity peripheral artery disease  . Unspecified hypertensive kidney disease with chronic kidney disease stage I through stage IV, or unspecified     cholesterol embolization syndrome  . GERD (gastroesophageal reflux disease)   . Exposure to STD   . Dysfunction of eustachian tube   . Routine general medical examination at a health care facility   . Family history of psychiatric condition     family Hx of suicide   . Alcoholism in family   . Family history of diabetes mellitus   . Lipoprotein deficiencies   . Impotence of organic origin     erectile dysfunction  . Other diseases of lung, not elsewhere classified     pulmonary nodule  . Sprain of neck     Hx  . Diverticulosis of colon (without mention of hemorrhage)   . Depressive disorder, not elsewhere classified   . Personal history of colonic polyps   . Tobacco use disorder    Past Surgical History  Procedure Laterality Date  . Angiography      bilateral lower extremity angiography, stenting of the left.   . Popliteal artery angioplasty      didnt specify angioplasty or stent  . Spermatocelectomy      left  . Lower extremity angiogram N/A 11/04/2012    Procedure: LOWER EXTREMITY ANGIOGRAM;  Surgeon: Wellington Hampshire, MD;  Location: [facility] CATH [facility];  Service: Cardiovascular;  Laterality: N/A;   No family history on file. History  Substance Use Topics  . Smoking status: Former Research scientist (life sciences)  . Smokeless tobacco: Not on file     Comment: QUIT 2009  . Alcohol Use: No     Comment: denies alcohol use. there is a history of heavy alcohol use     Review of Systems  Constitutional: Positive for fatigue. Negative for diaphoresis.  Cardiovascular: Positive for chest pain. Negative for leg swelling.  Gastrointestinal: Negative for nausea, vomiting and abdominal pain.  Neurological: Positive for headaches.  All other systems reviewed and are negative.   Allergies  Neomycin-bacitracin zn-polymyx; Niacin; and  Paroxetine  Home Medications   Prior to Admission medications   Medication Sig Start Date End Date Taking? Authorizing Provider  amLODipine (NORVASC) 10 MG tablet Take 5 mg by mouth daily.     Yes Historical Provider, MD  aspirin EC 81 MG tablet Take 1 tablet (81 mg total) by mouth daily. 10/09/12  Yes Sherren Mocha, MD  cholecalciferol (VITAMIN D) 1000 UNITS tablet Take 1,000 Units by mouth daily.   Yes Historical Provider, MD  clopidogrel (PLAVIX) 75 MG tablet Take 1 tablet (75 mg total) by mouth daily. Patient taking differently: Take 75 mg by mouth at bedtime.  11/04/12  Yes Wellington Hampshire, MD  cyclobenzaprine (FLEXERIL) 10 MG tablet Take 1 tablet (10 mg total) by mouth at bedtime. 10/28/12  Yes Sherren Mocha, MD  escitalopram (LEXAPRO) 10 MG tablet Take 1 tablet (10 mg total) by mouth 3 (three) times daily. 10/28/12  Yes Sherren Mocha, MD  finasteride (PROSCAR) 5 MG tablet Take 5 mg by mouth at bedtime.   Yes Historical Provider, MD  folic acid (FOLVITE) 1 MG tablet Take 1 mg by mouth daily.    Yes Historical Provider, MD  gabapentin (NEURONTIN) 300 MG capsule Take 600 mg by mouth 2 (two) times daily.   Yes Historical Provider, MD  HYDROcodone-acetaminophen (NORCO/VICODIN) 5-325 MG per tablet Take 1-2 tablets by mouth every 4 (four) hours as needed. Patient taking differently: Take 1-2 tablets by mouth every 4 (four) hours as needed for moderate pain or severe pain.  01/30/14  Yes Malvin Johns, MD  hydrOXYzine (ATARAX) 50 MG tablet Take 50 mg by mouth 2 (two) times daily.    Yes Historical Provider, MD  levothyroxine (SYNTHROID, LEVOTHROID) 75 MCG tablet Take 1 tablet (75 mcg total) by mouth daily before breakfast. 10/28/12  Yes Sherren Mocha, MD  methotrexate (RHEUMATREX) 2.5 MG tablet Take 12.5 mg by mouth once a week. Caution:Chemotherapy. Protect from light.Take on Friday   Yes Historical Provider, MD  mirtazapine (REMERON) 15 MG tablet Take 15 mg by mouth at bedtime.     Yes  Historical Provider, MD  multivitamin [name]) per tablet Take 1 tablet by mouth daily.     Yes Historical Provider, MD  oxybutynin (DITROPAN) 5 MG tablet Take 5 mg by mouth at bedtime.   Yes Historical Provider, MD  pantoprazole (PROTONIX) 40 MG tablet Take 40 mg by mouth daily.     Yes Historical Provider, MD  pravastatin (PRAVACHOL) 40 MG tablet Take 40 mg by mouth at bedtime.     Yes Historical Provider, MD  prazosin (MINIPRESS) 2 MG capsule Take 2 capsules (4 mg total) by mouth at bedtime. 10/29/12  Yes Sherren Mocha, MD  Thiamine HCl (VITAMIN B-1) 250 MG tablet Take 250 mg by mouth daily.     Yes Historical Provider, MD  vitamin E 600 UNIT capsule Take 600 Units by mouth daily.     Yes Historical  Provider, MD  ciprofloxacin (CIPRO) 500 MG tablet Take 1 tablet (500 mg total) by mouth 2 (two) times daily. One po bid x 7 days Patient not taking: Reported on 07/18/2014 01/30/14   Malvin Johns, MD  fluconazole (DIFLUCAN) 100 MG tablet Take 1 tablet (100 mg total) by mouth daily. Patient not taking: Reported on 07/18/2014 12/21/13   Wellington Hampshire, MD  metroNIDAZOLE (FLAGYL) 500 MG tablet Take 1 tablet (500 mg total) by mouth 2 (two) times daily. One po bid x 7 days Patient not taking: Reported on 07/18/2014 01/30/14   Malvin Johns, MD  ondansetron (ZOFRAN ODT) 8 MG disintegrating tablet '8mg'$  ODT q4 hours prn nausea Patient not taking: Reported on 07/18/2014 01/30/14   Malvin Johns, MD   BP 174/97 mmHg  Pulse 72  Temp(Src) 98.1 F (36.7 C) (Oral)  Resp 18  SpO2 100% Physical Exam  Nursing note and vitals reviewed.   CONSTITUTIONAL: Well developed/well nourished HEAD: Normocephalic/atraumatic EYES: EOMI/PERRL ENMT: Mucous membranes moist NECK: supple no meningeal signs SPINE/BACK:entire spine nontender CV: S1/S2 noted, no murmurs/rubs/gallops noted LUNGS: Lungs are clear to auscultation bilaterally, no apparent distress ABDOMEN: soft, nontender, no rebound or guarding, bowel sounds  noted throughout abdomen GU:no cva tenderness NEURO: Pt is awake/alert/appropriate, moves all extremitiesx4.  No facial droop.   EXTREMITIES: pulses normal/equal, full ROM SKIN: warm, color normal PSYCH: no abnormalities of mood noted, alert and oriented to situation  ED Course  Procedures   DIAGNOSTIC STUDIES: Oxygen Saturation is 100% on RA, normal by my interpretation.    COORDINATION OF CARE: 12:11 AM Discussed treatment plan with pt at bedside and pt agreed to plan.  1:06 AM Pt is CP free He is well appearing No signs of dissection/PE Will admit D/w dr Hal Hope will admit  Labs Review Labs Reviewed  BASIC METABOLIC PANEL - Abnormal; Notable for the following:    Glucose, Bld 122 (*)    All other components within normal limits  CBC  BRAIN NATRIURETIC PEPTIDE  I-STAT TROPOININ, ED    Imaging Review Dg Chest 2 View  07/18/2014   CLINICAL DATA:  Left-sided chest pain and dyspnea, 2 days duration.  EXAM: CHEST  2 VIEW  COMPARISON:  01/30/2014  FINDINGS: The heart size and mediastinal contours are within normal limits. Both lungs are clear. The visualized skeletal structures are unremarkable.  IMPRESSION: No active cardiopulmonary disease.   Electronically Signed   By: Andreas Newport M.D.   On: 07/18/2014 21:16     EKG Interpretation   Date/Time:  Monday Jul 18 2014 20:25:53 EDT Ventricular Rate:  76 PR Interval:  142 QRS Duration: 88 QT Interval:  412 QTC Calculation: 463 R Axis:   67 Text Interpretation:  Normal sinus rhythm Normal ECG Confirmed by Christy Gentles   MD, Elenore Rota ([id number]) on 07/18/2014 11:55:28 PM     Medications  nitroGLYCERIN (NITROSTAT) SL tablet 0.4 mg (0.4 mg Sublingual Given 07/19/14 0028)   Pt is already on ASA/plavix MDM   Final diagnoses:  Chest pain, rule out acute myocardial infarction    Nursing notes including past medical history and social history reviewed and considered in documentation xrays/imaging reviewed by myself and  considered during evaluation Labs/vital reviewed myself and considered during evaluation   I personally performed the services described in this documentation, which was scribed in my presence. The recorded information has been reviewed and is accurate.       Ripley Fraise, MD 07/19/14 [phone]

## 2014-07-19 ENCOUNTER — Encounter (HOSPITAL_COMMUNITY): Payer: Self-pay | Admitting: Internal Medicine

## 2014-07-19 ENCOUNTER — Ambulatory Visit: Payer: Self-pay | Admitting: Cardiovascular Disease

## 2014-07-19 ENCOUNTER — Encounter (HOSPITAL_COMMUNITY)
Admission: EM | Disposition: A | Discharge status: Home / Self Care | Payer: Medicare Other | Source: Home / Self Care | Attending: Emergency Medicine

## 2014-07-19 DIAGNOSIS — I739 Peripheral vascular disease, unspecified: Secondary | ICD-10-CM | POA: Insufficient documentation

## 2014-07-19 DIAGNOSIS — I200 Unstable angina: Secondary | ICD-10-CM | POA: Insufficient documentation

## 2014-07-19 DIAGNOSIS — I25110 Atherosclerotic heart disease of native coronary artery with unstable angina pectoris: Secondary | ICD-10-CM

## 2014-07-19 DIAGNOSIS — E440 Moderate protein-calorie malnutrition: Secondary | ICD-10-CM | POA: Insufficient documentation

## 2014-07-19 DIAGNOSIS — E785 Hyperlipidemia, unspecified: Secondary | ICD-10-CM

## 2014-07-19 DIAGNOSIS — I10 Essential (primary) hypertension: Secondary | ICD-10-CM | POA: Diagnosis not present

## 2014-07-19 DIAGNOSIS — E039 Hypothyroidism, unspecified: Secondary | ICD-10-CM | POA: Diagnosis not present

## 2014-07-19 DIAGNOSIS — R079 Chest pain, unspecified: Secondary | ICD-10-CM | POA: Diagnosis not present

## 2014-07-19 HISTORY — PX: CARDIAC CATHETERIZATION: SHX172

## 2014-07-19 LAB — CBC
HCT: 37.8 % — ABNORMAL LOW (ref 39.0–52.0)
HEMOGLOBIN: 12.2 g/dL — AB (ref 13.0–17.0)
MCH: 29.5 pg (ref 26.0–34.0)
MCHC: 32.3 g/dL (ref 30.0–36.0)
MCV: 91.5 fL (ref 78.0–100.0)
Platelets: 223 10*3/uL (ref 150–400)
RBC: 4.13 MIL/uL — ABNORMAL LOW (ref 4.22–5.81)
RDW: 14.6 % (ref 11.5–15.5)
WBC: 7.3 10*3/uL (ref 4.0–10.5)

## 2014-07-19 LAB — CREATININE, SERUM
Creatinine, Ser: 0.92 mg/dL (ref 0.61–1.24)
GFR calc Af Amer: >60 mL/min (ref >60)

## 2014-07-19 LAB — RAPID URINE DRUG SCREEN, HOSP PERFORMED
AMPHETAMINES: NOT DETECTED
BARBITURATES: NOT DETECTED
Benzodiazepines: NOT DETECTED
Cocaine: NOT DETECTED
Opiates: NOT DETECTED
Tetrahydrocannabinol: POSITIVE — AB

## 2014-07-19 LAB — PROTIME-INR
INR: 1.05 (ref 0.00–1.49)
PROTHROMBIN TIME: 13.8 s (ref 11.6–15.2)

## 2014-07-19 LAB — TROPONIN I
Troponin I: <0.03 ng/mL (ref <0.031)
Troponin I: <0.03 ng/mL (ref <0.031)
Troponin I: <0.03 ng/mL (ref <0.031)

## 2014-07-19 SURGERY — LEFT HEART CATH AND CORONARY ANGIOGRAPHY

## 2014-07-19 MED ORDER — LEVOTHYROXINE SODIUM 75 MCG PO TABS
75.0000 ug | ORAL_TABLET | Freq: Every day | ORAL | Status: DC
  Administered 2014-07-19 – 2014-07-20 (×2): 75 ug via ORAL
  Filled 2014-07-19 (×2): qty 1

## 2014-07-19 MED ORDER — SODIUM CHLORIDE 0.9 % IJ SOLN: 3.0000 mL | INTRAMUSCULAR | Status: DC | PRN

## 2014-07-19 MED ORDER — ASPIRIN EC 81 MG PO TBEC
81.0000 mg | DELAYED_RELEASE_TABLET | Freq: Every day | ORAL | Status: DC
  Administered 2014-07-19 – 2014-07-20 (×2): 81 mg via ORAL
  Filled 2014-07-19 (×2): qty 1

## 2014-07-19 MED ORDER — ADULT MULTIVITAMIN W/MINERALS CH
1.0000 | ORAL_TABLET | Freq: Every day | ORAL | Status: DC
  Administered 2014-07-19 – 2014-07-20 (×2): 1 via ORAL
  Filled 2014-07-19 (×2): qty 1

## 2014-07-19 MED ORDER — SODIUM CHLORIDE 0.9 % IJ SOLN
3.0000 mL | Freq: Two times a day (BID) | INTRAMUSCULAR | Status: DC
  Administered 2014-07-20: 3 mL via INTRAVENOUS

## 2014-07-19 MED ORDER — SODIUM CHLORIDE 0.9 % IV SOLN
INTRAVENOUS | Status: DC
  Administered 2014-07-19: 12:00:00 via INTRAVENOUS

## 2014-07-19 MED ORDER — SODIUM CHLORIDE 0.9 % IJ SOLN
3.0000 mL | Freq: Two times a day (BID) | INTRAMUSCULAR | Status: DC
  Administered 2014-07-19: 3 mL via INTRAVENOUS

## 2014-07-19 MED ORDER — NITROGLYCERIN 0.4 MG SL SUBL
0.4000 mg | SUBLINGUAL_TABLET | SUBLINGUAL | Status: DC | PRN
  Administered 2014-07-19: 0.4 mg via SUBLINGUAL
  Filled 2014-07-19: qty 1

## 2014-07-19 MED ORDER — CYCLOBENZAPRINE HCL 10 MG PO TABS
10.0000 mg | ORAL_TABLET | Freq: Every day | ORAL | Status: DC
  Administered 2014-07-19: 10 mg via ORAL
  Filled 2014-07-19: qty 1

## 2014-07-19 MED ORDER — SODIUM CHLORIDE 0.9 % IV SOLN: 250.0000 mL | INTRAVENOUS | Status: DC | PRN

## 2014-07-19 MED ORDER — AMLODIPINE BESYLATE 5 MG PO TABS
5.0000 mg | ORAL_TABLET | Freq: Every day | ORAL | Status: DC
  Administered 2014-07-19 – 2014-07-20 (×2): 5 mg via ORAL
  Filled 2014-07-19 (×2): qty 1

## 2014-07-19 MED ORDER — OXYBUTYNIN CHLORIDE 5 MG PO TABS
5.0000 mg | ORAL_TABLET | Freq: Every day | ORAL | Status: DC
  Administered 2014-07-19: 5 mg via ORAL
  Filled 2014-07-19: qty 1

## 2014-07-19 MED ORDER — LIDOCAINE HCL (PF) 1 % IJ SOLN
INTRAMUSCULAR | Status: AC
  Filled 2014-07-19: qty 30

## 2014-07-19 MED ORDER — SODIUM CHLORIDE 0.9 % IV SOLN: INTRAVENOUS | Status: DC

## 2014-07-19 MED ORDER — NITROGLYCERIN 1 MG/10 ML FOR IR/CATH LAB
INTRA_ARTERIAL | Status: AC
  Filled 2014-07-19: qty 10

## 2014-07-19 MED ORDER — VERAPAMIL HCL 2.5 MG/ML IV SOLN
INTRAVENOUS | Status: DC | PRN
  Administered 2014-07-19: 18:00:00 via INTRA_ARTERIAL

## 2014-07-19 MED ORDER — ENOXAPARIN SODIUM 40 MG/0.4ML ~~LOC~~ SOLN
40.0000 mg | Freq: Every day | SUBCUTANEOUS | Status: DC
  Administered 2014-07-19 – 2014-07-20 (×2): 40 mg via SUBCUTANEOUS
  Filled 2014-07-19 (×2): qty 0.4

## 2014-07-19 MED ORDER — VITAMIN E 180 MG (400 UNIT) PO CAPS
400.0000 [IU] | ORAL_CAPSULE | Freq: Every day | ORAL | Status: DC
  Administered 2014-07-19: 400 [IU] via ORAL
  Filled 2014-07-19 (×2): qty 1

## 2014-07-19 MED ORDER — HEPARIN SODIUM (PORCINE) 1000 UNIT/ML IJ SOLN
INTRAMUSCULAR | Status: DC | PRN
  Administered 2014-07-19: 4000 [IU] via INTRAVENOUS

## 2014-07-19 MED ORDER — MORPHINE SULFATE 2 MG/ML IJ SOLN: 2.0000 mg | INTRAMUSCULAR | Status: DC | PRN

## 2014-07-19 MED ORDER — FENTANYL CITRATE (PF) 100 MCG/2ML IJ SOLN
INTRAMUSCULAR | Status: AC
  Filled 2014-07-19: qty 2

## 2014-07-19 MED ORDER — MIRTAZAPINE 7.5 MG PO TABS
15.0000 mg | ORAL_TABLET | Freq: Every day | ORAL | Status: DC
  Administered 2014-07-19: 15 mg via ORAL
  Filled 2014-07-19: qty 2

## 2014-07-19 MED ORDER — HYDROXYZINE HCL 25 MG PO TABS
50.0000 mg | ORAL_TABLET | Freq: Two times a day (BID) | ORAL | Status: DC
  Administered 2014-07-19 – 2014-07-20 (×3): 50 mg via ORAL
  Filled 2014-07-19 (×3): qty 2

## 2014-07-19 MED ORDER — GABAPENTIN 600 MG PO TABS
600.0000 mg | ORAL_TABLET | Freq: Two times a day (BID) | ORAL | Status: DC
  Administered 2014-07-19 – 2014-07-20 (×3): 600 mg via ORAL
  Filled 2014-07-19 (×3): qty 1

## 2014-07-19 MED ORDER — MIDAZOLAM HCL 2 MG/2ML IJ SOLN
INTRAMUSCULAR | Status: DC | PRN
  Administered 2014-07-19: 1 mg via INTRAVENOUS

## 2014-07-19 MED ORDER — VERAPAMIL HCL 2.5 MG/ML IV SOLN
INTRAVENOUS | Status: AC
  Filled 2014-07-19: qty 2

## 2014-07-19 MED ORDER — PRAZOSIN HCL 2 MG PO CAPS
4.0000 mg | ORAL_CAPSULE | Freq: Every day | ORAL | Status: DC
  Administered 2014-07-19: 4 mg via ORAL
  Filled 2014-07-19 (×2): qty 2

## 2014-07-19 MED ORDER — SODIUM CHLORIDE 0.9 % WEIGHT BASED INFUSION
3.0000 mL/kg/h | INTRAVENOUS | Status: AC
Start: 2014-07-19 — End: 2014-07-19
  Administered 2014-07-19: 3 mL/kg/h via INTRAVENOUS

## 2014-07-19 MED ORDER — FINASTERIDE 5 MG PO TABS
5.0000 mg | ORAL_TABLET | Freq: Every day | ORAL | Status: DC
  Administered 2014-07-19: 5 mg via ORAL
  Filled 2014-07-19: qty 1

## 2014-07-19 MED ORDER — MIDAZOLAM HCL 2 MG/2ML IJ SOLN
INTRAMUSCULAR | Status: AC
  Filled 2014-07-19: qty 2

## 2014-07-19 MED ORDER — PRAVASTATIN SODIUM 40 MG PO TABS
40.0000 mg | ORAL_TABLET | Freq: Every day | ORAL | Status: DC
  Administered 2014-07-19: 40 mg via ORAL
  Filled 2014-07-19: qty 1

## 2014-07-19 MED ORDER — VITAMIN D 1000 UNITS PO TABS
1000.0000 [IU] | ORAL_TABLET | Freq: Every day | ORAL | Status: DC
  Administered 2014-07-19 – 2014-07-20 (×2): 1000 [IU] via ORAL
  Filled 2014-07-19 (×2): qty 1

## 2014-07-19 MED ORDER — ONDANSETRON HCL 4 MG/2ML IJ SOLN: 4.0000 mg | Freq: Four times a day (QID) | INTRAMUSCULAR | Status: DC | PRN

## 2014-07-19 MED ORDER — HEPARIN (PORCINE) IN NACL 2-0.9 UNIT/ML-% IJ SOLN
INTRAMUSCULAR | Status: AC
  Filled 2014-07-19: qty 1000

## 2014-07-19 MED ORDER — FOLIC ACID 1 MG PO TABS
1.0000 mg | ORAL_TABLET | Freq: Every day | ORAL | Status: DC
  Administered 2014-07-19 – 2014-07-20 (×2): 1 mg via ORAL
  Filled 2014-07-19 (×2): qty 1

## 2014-07-19 MED ORDER — ESCITALOPRAM OXALATE 10 MG PO TABS
10.0000 mg | ORAL_TABLET | Freq: Three times a day (TID) | ORAL | Status: DC
  Administered 2014-07-19 – 2014-07-20 (×4): 10 mg via ORAL
  Filled 2014-07-19 (×4): qty 1

## 2014-07-19 MED ORDER — METHOTREXATE 2.5 MG PO TABS
12.5000 mg | ORAL_TABLET | ORAL | Status: DC
Start: 2014-07-22 — End: 2014-07-20

## 2014-07-19 MED ORDER — CLOPIDOGREL BISULFATE 75 MG PO TABS
75.0000 mg | ORAL_TABLET | Freq: Every day | ORAL | Status: DC
  Administered 2014-07-19: 75 mg via ORAL
  Filled 2014-07-19: qty 1

## 2014-07-19 MED ORDER — HEPARIN SODIUM (PORCINE) 1000 UNIT/ML IJ SOLN
INTRAMUSCULAR | Status: AC
  Filled 2014-07-19: qty 1

## 2014-07-19 MED ORDER — VITAMIN B-1 100 MG PO TABS
250.0000 mg | ORAL_TABLET | Freq: Every day | ORAL | Status: DC
  Administered 2014-07-19 – 2014-07-20 (×2): 250 mg via ORAL
  Filled 2014-07-19 (×2): qty 3

## 2014-07-19 MED ORDER — ACETAMINOPHEN 325 MG PO TABS: 650.0000 mg | ORAL_TABLET | ORAL | Status: DC | PRN

## 2014-07-19 MED ORDER — FENTANYL CITRATE (PF) 100 MCG/2ML IJ SOLN
INTRAMUSCULAR | Status: DC | PRN
  Administered 2014-07-19: 25 ug via INTRAVENOUS

## 2014-07-19 MED ORDER — PANTOPRAZOLE SODIUM 40 MG PO TBEC
40.0000 mg | DELAYED_RELEASE_TABLET | Freq: Every day | ORAL | Status: DC
  Administered 2014-07-19 – 2014-07-20 (×2): 40 mg via ORAL
  Filled 2014-07-19 (×2): qty 1

## 2014-07-19 MED ORDER — IOHEXOL 350 MG/ML SOLN
INTRAVENOUS | Status: DC | PRN
  Administered 2014-07-19: 100 mL via INTRA_ARTERIAL

## 2014-07-19 SURGICAL SUPPLY — 13 items
CATH INFINITI 5FR ANG PIGTAIL (CATHETERS) ×3 IMPLANT
CATH INFINITI 5FR MULTPACK ANG (CATHETERS) IMPLANT
CATH OPTITORQUE TIG 4.0 5F (CATHETERS) ×3 IMPLANT
DEVICE RAD COMP TR BAND LRG (VASCULAR PRODUCTS) ×3 IMPLANT
GLIDESHEATH SLEND A-KIT 6F 22G (SHEATH) ×3 IMPLANT
KIT HEART LEFT (KITS) ×3 IMPLANT
PACK CARDIAC CATHETERIZATION (CUSTOM PROCEDURE TRAY) ×3 IMPLANT
SHEATH PINNACLE 5F 10CM (SHEATH) IMPLANT
SYR MEDRAD MARK V 150ML (SYRINGE) ×3 IMPLANT
TRANSDUCER W/STOPCOCK (MISCELLANEOUS) ×3 IMPLANT
TUBING CIL FLEX 10 FLL-RA (TUBING) ×3 IMPLANT
WIRE EMERALD 3MM-J .035X150CM (WIRE) IMPLANT
WIRE SAFE-T 1.5MM-J .035X260CM (WIRE) ×3 IMPLANT

## 2014-07-19 NOTE — Consult Note (Addendum)
CONSULT NOTE  Date: 07/19/2014               Patient Name:  David David MRN: [id number]  DOB: 1950-12-06 Age / Sex: 64 y.o., male        PCP: No PCP Per Patient Primary Cardiologist: Fletcher Anon            Referring Physician: Sloan Leiter              Reason for Consult: Chest pain            History of Present Illness: Patient is a 64 y.o. male with a PMHx of peripheral arterial disease, rheumatoid arthritis, hypothyroidism, hypertension, ongoing tobacco abuse presents to the year because of chest pain.  The pain was midsternal , no radiation.  Occurred at rest.  Not necessarily worsened with exertion.     The pain is a tight sensation,  Associated with increased dyspnea.   In the ER the pain was  relieved with SL NTG.  He gets exercise - does lots of yard work.  Pain has been present for several days,     Medications: Outpatient medications: Prescriptions prior to admission  Medication Sig Dispense Refill Last Dose  . amLODipine (NORVASC) 10 MG tablet Take 5 mg by mouth daily.     07/18/2014 at Unknown time  . aspirin EC 81 MG tablet Take 1 tablet (81 mg total) by mouth daily. 90 tablet 3 07/18/2014 at Unknown time  . cholecalciferol (VITAMIN D) 1000 UNITS tablet Take 1,000 Units by mouth daily.   07/18/2014 at Unknown time  . clopidogrel (PLAVIX) 75 MG tablet Take 1 tablet (75 mg total) by mouth daily. (Patient taking differently: Take 75 mg by mouth at bedtime. ) 30 tablet 3 07/17/2014 at Unknown time  . cyclobenzaprine (FLEXERIL) 10 MG tablet Take 1 tablet (10 mg total) by mouth at bedtime. 30 tablet  07/17/2014 at Unknown time  . escitalopram (LEXAPRO) 10 MG tablet Take 1 tablet (10 mg total) by mouth 3 (three) times daily.   07/18/2014 at Unknown time  . finasteride (PROSCAR) 5 MG tablet Take 5 mg by mouth at bedtime.   07/17/2014 at Unknown time  . folic acid (FOLVITE) 1 MG tablet Take 1 mg by mouth daily.    07/18/2014 at Unknown time  . gabapentin (NEURONTIN) 300 MG capsule Take 600  mg by mouth 2 (two) times daily.   07/18/2014 at Unknown time  . HYDROcodone-acetaminophen (NORCO/VICODIN) 5-325 MG per tablet Take 1-2 tablets by mouth every 4 (four) hours as needed. (Patient taking differently: Take 1-2 tablets by mouth every 4 (four) hours as needed for moderate pain or severe pain. ) 20 tablet 0 over 30 days  . hydrOXYzine (ATARAX) 50 MG tablet Take 50 mg by mouth 2 (two) times daily.    07/18/2014 at Unknown time  . levothyroxine (SYNTHROID, LEVOTHROID) 75 MCG tablet Take 1 tablet (75 mcg total) by mouth daily before breakfast.   07/18/2014 at Unknown time  . methotrexate (RHEUMATREX) 2.5 MG tablet Take 12.5 mg by mouth once a week. Caution:Chemotherapy. Protect from light.Take on Friday   07/15/2014  . mirtazapine (REMERON) 15 MG tablet Take 15 mg by mouth at bedtime.     07/17/2014 at Unknown time  . multivitamin (THERAGRAN) per tablet Take 1 tablet by mouth daily.     07/18/2014 at Unknown time  . oxybutynin (DITROPAN) 5 MG tablet Take 5 mg by mouth at bedtime.   07/17/2014  at Unknown time  . pantoprazole (PROTONIX) 40 MG tablet Take 40 mg by mouth daily.     07/18/2014 at Unknown time  . pravastatin (PRAVACHOL) 40 MG tablet Take 40 mg by mouth at bedtime.     07/17/2014 at Unknown time  . prazosin (MINIPRESS) 2 MG capsule Take 2 capsules (4 mg total) by mouth at bedtime.   07/17/2014 at Unknown time  . Thiamine HCl (VITAMIN B-1) 250 MG tablet Take 250 mg by mouth daily.     07/18/2014 at Unknown time  . vitamin E 600 UNIT capsule Take 600 Units by mouth daily.     07/18/2014 at Unknown time  . ciprofloxacin (CIPRO) 500 MG tablet Take 1 tablet (500 mg total) by mouth 2 (two) times daily. One po bid x 7 days (Patient not taking: Reported on 07/18/2014) 14 tablet 0 Completed Course at Unknown time  . fluconazole (DIFLUCAN) 100 MG tablet Take 1 tablet (100 mg total) by mouth daily. (Patient not taking: Reported on 07/18/2014) 7 tablet 0 Completed Course at Unknown time  . metroNIDAZOLE (FLAGYL) 500 MG  tablet Take 1 tablet (500 mg total) by mouth 2 (two) times daily. One po bid x 7 days (Patient not taking: Reported on 07/18/2014) 14 tablet 0 Completed Course at Unknown time  . ondansetron (ZOFRAN ODT) 8 MG disintegrating tablet '8mg'$  ODT q4 hours prn nausea (Patient not taking: Reported on 07/18/2014) 10 tablet 0 Not Taking at Unknown time    Current medications: Current Facility-Administered Medications  Medication Dose Route Frequency Provider Last Rate Last Dose  . 0.9 %  sodium chloride infusion   Intravenous Continuous Rise Patience, MD      . acetaminophen (TYLENOL) tablet 650 mg  650 mg Oral Q4H PRN Rise Patience, MD      . amLODipine (NORVASC) tablet 5 mg  5 mg Oral Daily Rise Patience, MD      . aspirin EC tablet 81 mg  81 mg Oral Daily Rise Patience, MD      . cholecalciferol (VITAMIN D) tablet 1,000 Units  1,000 Units Oral Daily Rise Patience, MD      . clopidogrel (PLAVIX) tablet 75 mg  75 mg Oral QHS Rise Patience, MD      . cyclobenzaprine (FLEXERIL) tablet 10 mg  10 mg Oral QHS Rise Patience, MD      . enoxaparin (LOVENOX) injection 40 mg  40 mg Subcutaneous Daily Rise Patience, MD      . escitalopram (LEXAPRO) tablet 10 mg  10 mg Oral TID Rise Patience, MD      . finasteride (PROSCAR) tablet 5 mg  5 mg Oral QHS Rise Patience, MD      . folic acid (FOLVITE) tablet 1 mg  1 mg Oral Daily Rise Patience, MD      . gabapentin (NEURONTIN) tablet 600 mg  600 mg Oral BID Rise Patience, MD      . hydrOXYzine (ATARAX/VISTARIL) tablet 50 mg  50 mg Oral BID Rise Patience, MD      . levothyroxine (SYNTHROID, LEVOTHROID) tablet 75 mcg  75 mcg Oral QAC breakfast Rise Patience, MD   75 mcg at 07/19/14 0739  . [START ON 07/22/2014] methotrexate (RHEUMATREX) tablet 12.5 mg  12.5 mg Oral Q Fri Rise Patience, MD      . mirtazapine (REMERON) tablet 15 mg  15 mg Oral QHS Rise Patience, MD      .  morphine 2  MG/ML injection 2 mg  2 mg Intravenous Q2H PRN Rise Patience, MD      . multivitamin with minerals tablet 1 tablet  1 tablet Oral Daily Rise Patience, MD      . nitroGLYCERIN (NITROSTAT) SL tablet 0.4 mg  0.4 mg Sublingual Q5 min PRN Ripley Fraise, MD   0.4 mg at 07/19/14 0028  . ondansetron (ZOFRAN) injection 4 mg  4 mg Intravenous Q6H PRN Rise Patience, MD      . oxybutynin (DITROPAN) tablet 5 mg  5 mg Oral QHS Rise Patience, MD      . pantoprazole (PROTONIX) EC tablet 40 mg  40 mg Oral Daily Rise Patience, MD      . pravastatin (PRAVACHOL) tablet 40 mg  40 mg Oral QHS Rise Patience, MD      . prazosin (MINIPRESS) capsule 4 mg  4 mg Oral QHS Rise Patience, MD      . thiamine (VITAMIN B-1) tablet 250 mg  250 mg Oral Daily Rise Patience, MD      . vitamin E capsule 400 Units  400 Units Oral Daily Rise Patience, MD         Allergies  Allergen Reactions  . Neomycin-Bacitracin Zn-Polymyx     unknown  . Niacin     unknown  . Paroxetine     REACTION: goofy     Past Medical History  Diagnosis Date  . Peripheral vascular disease, unspecified     lower extremity peripheral artery disease  . Unspecified hypertensive kidney disease with chronic kidney disease stage I through stage IV, or unspecified     cholesterol embolization syndrome  . GERD (gastroesophageal reflux disease)   . Exposure to STD   . Dysfunction of eustachian tube   . Routine general medical examination at a health care facility   . Family history of psychiatric condition     family Hx of suicide   . Alcoholism in family   . Family history of diabetes mellitus   . Lipoprotein deficiencies   . Impotence of organic origin     erectile dysfunction  . Other diseases of lung, not elsewhere classified     pulmonary nodule  . Sprain of neck     Hx  . Diverticulosis of colon (without mention of hemorrhage)   . Depressive disorder, not elsewhere classified   .  Personal history of colonic polyps   . Tobacco use disorder     Past Surgical History  Procedure Laterality Date  . Angiography      bilateral lower extremity angiography, stenting of the left.   . Popliteal artery angioplasty      didnt specify angioplasty or stent  . Spermatocelectomy      left  . Lower extremity angiogram N/A 11/04/2012    Procedure: LOWER EXTREMITY ANGIOGRAM;  Surgeon: Wellington Hampshire, MD;  Location: [facility]ATH [facility];  Service: Cardiovascular;  Laterality: N/A;    Family History  Problem Relation Age of Onset  . CAD Father   . Congestive Heart Failure Mother     Social History:  reports that he has quit smoking. He does not have any smokeless tobacco history on file. He reports that he does not drink alcohol or use illicit drugs.   Review of Systems: Constitutional:  denies fever, chills, diaphoresis, appetite change and fatigue.  HEENT: denies photophobia, eye pain, redness, hearing loss, ear pain, congestion, sore throat, rhinorrhea, sneezing,  neck pain, neck stiffness and tinnitus.  Respiratory: denies SOB, DOE, cough, chest tightness, and wheezing.  Cardiovascular: denies chest pain, palpitations and leg swelling.  Gastrointestinal: denies nausea, vomiting, abdominal pain, diarrhea, constipation, blood in stool.  Genitourinary: denies dysuria, urgency, frequency, hematuria, flank pain and difficulty urinating.  Musculoskeletal: denies  myalgias, back pain, joint swelling, arthralgias and gait problem.   Skin: denies pallor, rash and wound.  Neurological: denies dizziness, seizures, syncope, weakness, light-headedness, numbness and headaches.   Hematological: denies adenopathy, easy bruising, personal or family bleeding history.  Psychiatric/ Behavioral: denies suicidal ideation, mood changes, confusion, nervousness, sleep disturbance and agitation.    Physical Exam: BP 136/73 mmHg  Pulse 67  Temp(Src) 97.8 F (36.6 C) (Oral)  Resp 16  Ht '6\' 4"'$  (1.93  m)  Wt 171 lb 6.4 oz (77.747 kg)  BMI 20.87 kg/m2  SpO2 97%  Wt Readings from Last 3 Encounters:  07/19/14 171 lb 6.4 oz (77.747 kg)  01/30/14 175 lb (79.379 kg)  12/14/13 174 lb (78.926 kg)    General: Vital signs reviewed and noted. Well-developed, well-nourished, in no acute distress; alert,   Head: Normocephalic, atraumatic, sclera anicteric,   Neck: Supple. Negative for carotid bruits. No JVD   Lungs:  Clear bilaterally, no  wheezes, rales, or rhonchi. Breathing is normal   Heart: RRR with S1 S2. No murmurs, rubs, or gallops   Abdomen/ GI :  Soft, non-tender, non-distended with normoactive bowel sounds. No hepatomegaly. No rebound/guarding. No obvious abdominal masses   MSK: Strength and the appear normal for age.   Extremities: No clubbing or cyanosis. No edema.  Distal pedal pulses are 2+ and equal   Neurologic:  CN are grossly intact,  No obvious motor or sensory defect.  Alert and oriented X 3. Moves all extremities spontaneously.  Psych: Responds to questions appropriately with a normal affect.     Lab results: Basic Metabolic Panel:  Recent Labs Lab 07/18/14 2030 07/19/14 0500  NA 139  --   K 3.6  --   CL 107  --   CO2 23  --   GLUCOSE 122*  --   BUN 9  --   CREATININE 0.94 0.92  CALCIUM 9.2  --     Liver Function Tests: No results for input(s): AST, ALT, ALKPHOS, BILITOT, PROT, ALBUMIN in the last 168 hours. No results for input(s): LIPASE, AMYLASE in the last 168 hours. No results for input(s): AMMONIA in the last 168 hours.  CBC:  Recent Labs Lab 07/18/14 2030 07/19/14 0500  WBC 8.4 7.3  HGB 13.5 12.2*  HCT 41.4 37.8*  MCV 92.2 91.5  PLT 231 223    Cardiac Enzymes:  Recent Labs Lab 07/19/14 0500  TROPONINI <0.03    BNP: Invalid input(s): POCBNP  CBG: No results for input(s): GLUCAP in the last 168 hours.  Coagulation Studies: No results for input(s): LABPROT, INR in the last 72 hours.   Other results: Personal review of EKG  shows :  - NSR, NS ST abn.   Imaging: Dg Chest 2 View  07/18/2014   CLINICAL DATA:  Left-sided chest pain and dyspnea, 2 days duration.  EXAM: CHEST  2 VIEW  COMPARISON:  01/30/2014  FINDINGS: The heart size and mediastinal contours are within normal limits. Both lungs are clear. The visualized skeletal structures are unremarkable.  IMPRESSION: No active cardiopulmonary disease.   Electronically Signed   By: Andreas Newport M.D.   On: 07/18/2014 21:16  Assessment & Plan:  1. Unstable angina:  His symptoms are concerning for UAP.  Had CP , associated with dyspnea, promptly relieved with SL NTG.   He is a smoker, has known PVD.  , + family hx of CAD. I think our best option is to proceed with cath.  I've discussed the risks , benefits, options.  He understands and agrees to proceed.  Troponin levels are negative .  Cath scheduled for today.   2. Essential Hypertension:  Continue amlodipine  3.  Hyperlipidemia:   Lipid levels are ok .  Continue pravachol.     Thayer Headings, Brooke Bonito., MD, [facility] 07/19/2014, 8:51 AM Office - [phone] Pager [phone]

## 2014-07-19 NOTE — Interval H&P Note (Signed)
History and Physical Interval Note:  07/19/2014 6:06 PM  David David  has presented today for surgery, with the diagnosis of Unstable Angina. The various methods of treatment have been discussed with the patient and family. After consideration of risks, benefits and other options for treatment, the patient has consented to  Procedure(s): Left Heart Cath and Coronary Angiography (N/A) with possible Percutaneous Coronary Intervention  as a surgical intervention .    The patient's history has been reviewed, patient examined, no change in status, stable for surgery.  I have reviewed the patient's chart and labs.  Questions were answered to the patient's satisfaction.    Cath Lab Visit (complete for each Cath Lab visit)  Clinical Evaluation Leading to the Procedure:   ACS: Yes.    Non-ACS:    Anginal Classification: CCS IV  Anti-ischemic medical therapy: Minimal Therapy (1 class of medications)  Non-Invasive Test Results: No non-invasive testing performed  Prior CABG: No previous CABG  TIMI SCORE  Patient Information:  TIMI Score is 4  UA/NSTEMI and intermediate-risk features (e.g., TIMI score 3?4) for short-term risk of death or nonfatal MI  Revascularization of the presumed culprit artery   A (8)  Indication: 10; Score: 8    David David

## 2014-07-19 NOTE — Progress Notes (Signed)
Initial Nutrition Assessment  DOCUMENTATION CODES:  Non-severe (moderate) malnutrition in context of chronic illness  INTERVENTION:    Diet advancement as able after procedure, recommend heart healthy diet with Ensure Enlive PO BID, each supplement provides 350 kcal and 20 grams of protein   NUTRITION DIAGNOSIS:  Malnutrition related to chronic illness as evidenced by energy intake < 75% for > or equal to 1 month, mild depletion of muscle mass.   GOAL:  Patient will meet greater than or equal to 90% of their needs   MONITOR:  Diet advancement, PO intake, Labs, Weight trends  REASON FOR ASSESSMENT:  Malnutrition Screening Tool    ASSESSMENT:  Patient admitted on 5/2 with chest pain x 2 days. For cardiac cath this afternoon.  Patient reports that he has been eating poorly for the past several months due to poor appetite. He has tried V8 nutritional supplements. He agreed to try Ensure supplements when diet is advanced. He is currently NPO for cardiac cath today, but he is hungry. Some mild muscle depletion noticed.  Height:  Ht Readings from Last 1 Encounters:  07/19/14 '6\' 4"'$  (1.93 m)    Weight:  Wt Readings from Last 1 Encounters:  07/19/14 171 lb 6.4 oz (77.747 kg)    Ideal Body Weight:  91.8 kg  Wt Readings from Last 10 Encounters:  07/19/14 171 lb 6.4 oz (77.747 kg)  01/30/14 175 lb (79.379 kg)  12/14/13 174 lb (78.926 kg)  05/23/13 189 lb (85.73 kg)  11/24/12 195 lb (88.451 kg)  11/04/12 192 lb (87.091 kg)  10/21/12 190 lb (86.183 kg)  10/09/12 192 lb (87.091 kg)  11/08/11 194 lb 12.8 oz (88.361 kg)  10/04/10 203 lb (92.08 kg)    BMI:  Body mass index is 20.87 kg/(m^2).  Estimated Nutritional Needs:  Kcal:  2050-2250  Protein:  100-115 gm  Fluid:  2.2 L  Skin:  Reviewed, no issues  Diet Order:  Diet NPO time specified  EDUCATION NEEDS:  Education needs addressed   Intake/Output Summary (Last 24 hours) at 07/19/14 1515 Last data  filed at 07/19/14 1144  Gross per 24 hour  Intake      0 ml  Output    100 ml  Net   -100 ml    Last BM:  5/2   Molli Barrows, RD, LDN, [place] Pager [phone] After Hours Pager [phone]

## 2014-07-19 NOTE — Progress Notes (Signed)
PATIENT DETAILS Name: David David Age: 64 y.o. Sex: male Date of Birth: 16-Mar-1951 Admit Date: 07/18/2014 Admitting Physician Rise Patience, MD PCP:No PCP Per Patient  Subjective: No further chest pain.  Assessment/Plan: Principal Problem:   Chest pain: Suspect unstable angina, seen by cardiology-plans are for cardiac catheterization later today. Continue aspirin, statin.  Active Problems:   Essential hypertension:controlled with amlodipine and prazosin.    Hyperlipidemia: Continue with statin    Hypothyroidism: Continue with levothyroxine    History of peripheral vascular disease: Appears stable-lower extremities warm    History of rheumatoid arthritis: Continue with methotrexate on discharge    History of BPH: Continue with Proscar and prazosin    Tobacco abuse: Counseled.  Disposition: Remain inpatient  Antimicrobial agents  See below  Anti-infectives    None      DVT Prophylaxis: Prophylactic Lovenox   Code Status: Full code   Family Communication None at bedside  Procedures: None  CONSULTS:  cardiology   MEDICATIONS: Scheduled Meds: . amLODipine  5 mg Oral Daily  . aspirin EC  81 mg Oral Daily  . cholecalciferol  1,000 Units Oral Daily  . clopidogrel  75 mg Oral QHS  . cyclobenzaprine  10 mg Oral QHS  . enoxaparin (LOVENOX) injection  40 mg Subcutaneous Daily  . escitalopram  10 mg Oral TID  . finasteride  5 mg Oral QHS  . folic acid  1 mg Oral Daily  . gabapentin  600 mg Oral BID  . hydrOXYzine  50 mg Oral BID  . levothyroxine  75 mcg Oral QAC breakfast  . [START ON 07/22/2014] methotrexate  12.5 mg Oral Q Fri  . mirtazapine  15 mg Oral QHS  . multivitamin with minerals  1 tablet Oral Daily  . oxybutynin  5 mg Oral QHS  . pantoprazole  40 mg Oral Daily  . pravastatin  40 mg Oral QHS  . prazosin  4 mg Oral QHS  . sodium chloride  3 mL Intravenous Q12H  . vitamin B-1  250 mg Oral Daily  . vitamin E  400  Units Oral Daily   Continuous Infusions: . sodium chloride    . sodium chloride 100 mL/hr at 07/19/14 1145   PRN Meds:.sodium chloride, acetaminophen, morphine injection, nitroGLYCERIN, ondansetron (ZOFRAN) IV, sodium chloride    PHYSICAL EXAM: Vital signs in last 24 hours: Filed Vitals:   07/19/14 0115 07/19/14 0146 07/19/14 0513 07/19/14 1100  BP: 114/61 125/79 136/73 113/67  Pulse: 56 67 67 65  Temp:  98.4 F (36.9 C) 97.8 F (36.6 C) 98 F (36.7 C)  TempSrc:  Oral Oral Oral  Resp: '16 16 16 16  '$ Height:  '6\' 4"'$  (1.93 m)    Weight:  77.747 kg (171 lb 6.4 oz)    SpO2: 96% 100% 97% 97%    Weight change:  Filed Weights   07/19/14 0146  Weight: 77.747 kg (171 lb 6.4 oz)   Body mass index is 20.87 kg/(m^2).   Gen Exam: Awake and alert with clear speech.  Neck: Supple, No JVD.   Chest: B/L Clear.   CVS: S1 S2 Regular, no murmurs.  Abdomen: soft, BS +, non tender, non distended.  Extremities: no edema, lower extremities warm to touch. Neurologic: Non Focal.   Skin: No Rash.   Wounds: N/A.   Intake/Output from previous day:  Intake/Output Summary (Last 24 hours) at 07/19/14 1427 Last data  filed at 07/19/14 1144  Gross per 24 hour  Intake      0 ml  Output    100 ml  Net   -100 ml     LAB RESULTS: CBC  Recent Labs Lab 07/18/14 2030 07/19/14 0500  WBC 8.4 7.3  HGB 13.5 12.2*  HCT 41.4 37.8*  PLT 231 223  MCV 92.2 91.5  MCH 30.1 29.5  MCHC 32.6 32.3  RDW 14.8 14.6    Chemistries   Recent Labs Lab 07/18/14 2030 07/19/14 0500  NA 139  --   K 3.6  --   CL 107  --   CO2 23  --   GLUCOSE 122*  --   BUN 9  --   CREATININE 0.94 0.92  CALCIUM 9.2  --     CBG: No results for input(s): GLUCAP in the last 168 hours.  GFR Estimated Creatinine Clearance: 88 mL/min (by C-G formula based on Cr of 0.92).  Coagulation profile  Recent Labs Lab 07/19/14 1229  INR 1.05    Cardiac Enzymes  Recent Labs Lab 07/19/14 0500 07/19/14 1134    TROPONINI <0.03 <0.03    Invalid input(s): POCBNP No results for input(s): DDIMER in the last 72 hours. No results for input(s): HGBA1C in the last 72 hours. No results for input(s): CHOL, HDL, LDLCALC, TRIG, CHOLHDL, LDLDIRECT in the last 72 hours. No results for input(s): TSH, T4TOTAL, T3FREE, THYROIDAB in the last 72 hours.  Invalid input(s): FREET3 No results for input(s): VITAMINB12, FOLATE, FERRITIN, TIBC, IRON, RETICCTPCT in the last 72 hours. No results for input(s): LIPASE, AMYLASE in the last 72 hours.  Urine Studies No results for input(s): UHGB, CRYS in the last 72 hours.  Invalid input(s): UACOL, UAPR, USPG, UPH, UTP, UGL, UKET, UBIL, UNIT, UROB, ULEU, UEPI, UWBC, URBC, UBAC, CAST, UCOM, BILUA  MICROBIOLOGY: No results found for this or any previous visit (from the past 240 hour(s)).  RADIOLOGY STUDIES/RESULTS: Dg Chest 2 View  07/18/2014   CLINICAL DATA:  Left-sided chest pain and dyspnea, 2 days duration.  EXAM: CHEST  2 VIEW  COMPARISON:  01/30/2014  FINDINGS: The heart size and mediastinal contours are within normal limits. Both lungs are clear. The visualized skeletal structures are unremarkable.  IMPRESSION: No active cardiopulmonary disease.   Electronically Signed   By: Andreas Newport M.D.   On: 07/18/2014 21:16    Oren Binet, MD  [facility] Pager:[phone]  If 7PM-7AM, please contact night-coverage [email or web address] Password [email or web address] 07/19/2014, 2:27 PM

## 2014-07-19 NOTE — H&P (View-Only) (Signed)
CONSULT NOTE  Date: 07/19/2014               Patient Name:  David David MRN: [id number]  DOB: [birth year] Age / Sex: 64 y.o., male        PCP: No PCP Per Patient Primary Cardiologist: Fletcher Anon            Referring Physician: Sloan Leiter              Reason for Consult: Chest pain            History of Present Illness: Patient is a 64 y.o. male with a PMHx of peripheral arterial disease, rheumatoid arthritis, hypothyroidism, hypertension, ongoing tobacco abuse presents to the year because of chest pain.  The pain was midsternal , no radiation.  Occurred at rest.  Not necessarily worsened with exertion.     The pain is a tight sensation,  Associated with increased dyspnea.   In the ER the pain was  relieved with SL NTG.  He gets exercise - does lots of yard work.  Pain has been present for several days,     Medications: Outpatient medications: Prescriptions prior to admission  Medication Sig Dispense Refill Last Dose  . amLODipine (NORVASC) 10 MG tablet Take 5 mg by mouth daily.     07/18/2014 at Unknown time  . aspirin EC 81 MG tablet Take 1 tablet (81 mg total) by mouth daily. 90 tablet 3 07/18/2014 at Unknown time  . cholecalciferol (VITAMIN D) 1000 UNITS tablet Take 1,000 Units by mouth daily.   07/18/2014 at Unknown time  . clopidogrel (PLAVIX) 75 MG tablet Take 1 tablet (75 mg total) by mouth daily. (Patient taking differently: Take 75 mg by mouth at bedtime. ) 30 tablet 3 07/17/2014 at Unknown time  . cyclobenzaprine (FLEXERIL) 10 MG tablet Take 1 tablet (10 mg total) by mouth at bedtime. 30 tablet  07/17/2014 at Unknown time  . escitalopram (LEXAPRO) 10 MG tablet Take 1 tablet (10 mg total) by mouth 3 (three) times daily.   07/18/2014 at Unknown time  . finasteride (PROSCAR) 5 MG tablet Take 5 mg by mouth at bedtime.   07/17/2014 at Unknown time  . folic acid (FOLVITE) 1 MG tablet Take 1 mg by mouth daily.    07/18/2014 at Unknown time  . gabapentin (NEURONTIN) 300 MG capsule Take 600  mg by mouth 2 (two) times daily.   07/18/2014 at Unknown time  . HYDROcodone-acetaminophen (NORCO/VICODIN) 5-325 MG per tablet Take 1-2 tablets by mouth every 4 (four) hours as needed. (Patient taking differently: Take 1-2 tablets by mouth every 4 (four) hours as needed for moderate pain or severe pain. ) 20 tablet 0 over 30 days  . hydrOXYzine (ATARAX) 50 MG tablet Take 50 mg by mouth 2 (two) times daily.    07/18/2014 at Unknown time  . levothyroxine (SYNTHROID, LEVOTHROID) 75 MCG tablet Take 1 tablet (75 mcg total) by mouth daily before breakfast.   07/18/2014 at Unknown time  . methotrexate (RHEUMATREX) 2.5 MG tablet Take 12.5 mg by mouth once a week. Caution:Chemotherapy. Protect from light.Take on Friday   07/15/2014  . mirtazapine (REMERON) 15 MG tablet Take 15 mg by mouth at bedtime.     07/17/2014 at Unknown time  . multivitamin (THERAGRAN) per tablet Take 1 tablet by mouth daily.     07/18/2014 at Unknown time  . oxybutynin (DITROPAN) 5 MG tablet Take 5 mg by mouth at bedtime.   07/17/2014  at Unknown time  . pantoprazole (PROTONIX) 40 MG tablet Take 40 mg by mouth daily.     07/18/2014 at Unknown time  . pravastatin (PRAVACHOL) 40 MG tablet Take 40 mg by mouth at bedtime.     07/17/2014 at Unknown time  . prazosin (MINIPRESS) 2 MG capsule Take 2 capsules (4 mg total) by mouth at bedtime.   07/17/2014 at Unknown time  . Thiamine HCl (VITAMIN B-1) 250 MG tablet Take 250 mg by mouth daily.     07/18/2014 at Unknown time  . vitamin E 600 UNIT capsule Take 600 Units by mouth daily.     07/18/2014 at Unknown time  . ciprofloxacin (CIPRO) 500 MG tablet Take 1 tablet (500 mg total) by mouth 2 (two) times daily. One po bid x 7 days (Patient not taking: Reported on 07/18/2014) 14 tablet 0 Completed Course at Unknown time  . fluconazole (DIFLUCAN) 100 MG tablet Take 1 tablet (100 mg total) by mouth daily. (Patient not taking: Reported on 07/18/2014) 7 tablet 0 Completed Course at Unknown time  . metroNIDAZOLE (FLAGYL) 500 MG  tablet Take 1 tablet (500 mg total) by mouth 2 (two) times daily. One po bid x 7 days (Patient not taking: Reported on 07/18/2014) 14 tablet 0 Completed Course at Unknown time  . ondansetron (ZOFRAN ODT) 8 MG disintegrating tablet '8mg'$  ODT q4 hours prn nausea (Patient not taking: Reported on 07/18/2014) 10 tablet 0 Not Taking at Unknown time    Current medications: Current Facility-Administered Medications  Medication Dose Route Frequency Provider Last Rate Last Dose  . 0.9 %  sodium chloride infusion   Intravenous Continuous Rise Patience, MD      . acetaminophen (TYLENOL) tablet 650 mg  650 mg Oral Q4H PRN Rise Patience, MD      . amLODipine (NORVASC) tablet 5 mg  5 mg Oral Daily Rise Patience, MD      . aspirin EC tablet 81 mg  81 mg Oral Daily Rise Patience, MD      . cholecalciferol (VITAMIN D) tablet 1,000 Units  1,000 Units Oral Daily Rise Patience, MD      . clopidogrel (PLAVIX) tablet 75 mg  75 mg Oral QHS Rise Patience, MD      . cyclobenzaprine (FLEXERIL) tablet 10 mg  10 mg Oral QHS Rise Patience, MD      . enoxaparin (LOVENOX) injection 40 mg  40 mg Subcutaneous Daily Rise Patience, MD      . escitalopram (LEXAPRO) tablet 10 mg  10 mg Oral TID Rise Patience, MD      . finasteride (PROSCAR) tablet 5 mg  5 mg Oral QHS Rise Patience, MD      . folic acid (FOLVITE) tablet 1 mg  1 mg Oral Daily Rise Patience, MD      . gabapentin (NEURONTIN) tablet 600 mg  600 mg Oral BID Rise Patience, MD      . hydrOXYzine (ATARAX/VISTARIL) tablet 50 mg  50 mg Oral BID Rise Patience, MD      . levothyroxine (SYNTHROID, LEVOTHROID) tablet 75 mcg  75 mcg Oral QAC breakfast Rise Patience, MD   75 mcg at 07/19/14 0739  . [START ON 07/22/2014] methotrexate (RHEUMATREX) tablet 12.5 mg  12.5 mg Oral Q Fri Rise Patience, MD      . mirtazapine (REMERON) tablet 15 mg  15 mg Oral QHS Rise Patience, MD      .  morphine 2  MG/ML injection 2 mg  2 mg Intravenous Q2H PRN Rise Patience, MD      . multivitamin with minerals tablet 1 tablet  1 tablet Oral Daily Rise Patience, MD      . nitroGLYCERIN (NITROSTAT) SL tablet 0.4 mg  0.4 mg Sublingual Q5 min PRN Ripley Fraise, MD   0.4 mg at 07/19/14 0028  . ondansetron (ZOFRAN) injection 4 mg  4 mg Intravenous Q6H PRN Rise Patience, MD      . oxybutynin (DITROPAN) tablet 5 mg  5 mg Oral QHS Rise Patience, MD      . pantoprazole (PROTONIX) EC tablet 40 mg  40 mg Oral Daily Rise Patience, MD      . pravastatin (PRAVACHOL) tablet 40 mg  40 mg Oral QHS Rise Patience, MD      . prazosin (MINIPRESS) capsule 4 mg  4 mg Oral QHS Rise Patience, MD      . thiamine (VITAMIN B-1) tablet 250 mg  250 mg Oral Daily Rise Patience, MD      . vitamin E capsule 400 Units  400 Units Oral Daily Rise Patience, MD         Allergies  Allergen Reactions  . Neomycin-Bacitracin Zn-Polymyx     unknown  . Niacin     unknown  . Paroxetine     REACTION: goofy     Past Medical History  Diagnosis Date  . Peripheral vascular disease, unspecified     lower extremity peripheral artery disease  . Unspecified hypertensive kidney disease with chronic kidney disease stage I through stage IV, or unspecified     cholesterol embolization syndrome  . GERD (gastroesophageal reflux disease)   . Exposure to STD   . Dysfunction of eustachian tube   . Routine general medical examination at a health care facility   . Family history of psychiatric condition     family Hx of suicide   . Alcoholism in family   . Family history of diabetes mellitus   . Lipoprotein deficiencies   . Impotence of organic origin     erectile dysfunction  . Other diseases of lung, not elsewhere classified     pulmonary nodule  . Sprain of neck     Hx  . Diverticulosis of colon (without mention of hemorrhage)   . Depressive disorder, not elsewhere classified   .  Personal history of colonic polyps   . Tobacco use disorder     Past Surgical History  Procedure Laterality Date  . Angiography      bilateral lower extremity angiography, stenting of the left.   . Popliteal artery angioplasty      didnt specify angioplasty or stent  . Spermatocelectomy      left  . Lower extremity angiogram N/A 11/04/2012    Procedure: LOWER EXTREMITY ANGIOGRAM;  Surgeon: Wellington Hampshire, MD;  Location: [facility] CATH LAB;  Service: Cardiovascular;  Laterality: N/A;    Family History  Problem Relation Age of Onset  . CAD Father   . Congestive Heart Failure Mother     Social History:  reports that he has quit smoking. He does not have any smokeless tobacco history on file. He reports that he does not drink alcohol or use illicit drugs.   Review of Systems: Constitutional:  denies fever, chills, diaphoresis, appetite change and fatigue.  HEENT: denies photophobia, eye pain, redness, hearing loss, ear pain, congestion, sore throat, rhinorrhea, sneezing,  neck pain, neck stiffness and tinnitus.  Respiratory: denies SOB, DOE, cough, chest tightness, and wheezing.  Cardiovascular: denies chest pain, palpitations and leg swelling.  Gastrointestinal: denies nausea, vomiting, abdominal pain, diarrhea, constipation, blood in stool.  Genitourinary: denies dysuria, urgency, frequency, hematuria, flank pain and difficulty urinating.  Musculoskeletal: denies  myalgias, back pain, joint swelling, arthralgias and gait problem.   Skin: denies pallor, rash and wound.  Neurological: denies dizziness, seizures, syncope, weakness, light-headedness, numbness and headaches.   Hematological: denies adenopathy, easy bruising, personal or family bleeding history.  Psychiatric/ Behavioral: denies suicidal ideation, mood changes, confusion, nervousness, sleep disturbance and agitation.    Physical Exam: BP 136/73 mmHg  Pulse 67  Temp(Src) 97.8 F (36.6 C) (Oral)  Resp 16  Ht '6\' 4"'$  (1.93  m)  Wt 171 lb 6.4 oz (77.747 kg)  BMI 20.87 kg/m2  SpO2 97%  Wt Readings from Last 3 Encounters:  07/19/14 171 lb 6.4 oz (77.747 kg)  01/30/14 175 lb (79.379 kg)  12/14/13 174 lb (78.926 kg)    General: Vital signs reviewed and noted. Well-developed, well-nourished, in no acute distress; alert,   Head: Normocephalic, atraumatic, sclera anicteric,   Neck: Supple. Negative for carotid bruits. No JVD   Lungs:  Clear bilaterally, no  wheezes, rales, or rhonchi. Breathing is normal   Heart: RRR with S1 S2. No murmurs, rubs, or gallops   Abdomen/ GI :  Soft, non-tender, non-distended with normoactive bowel sounds. No hepatomegaly. No rebound/guarding. No obvious abdominal masses   MSK: Strength and the appear normal for age.   Extremities: No clubbing or cyanosis. No edema.  Distal pedal pulses are 2+ and equal   Neurologic:  CN are grossly intact,  No obvious motor or sensory defect.  Alert and oriented X 3. Moves all extremities spontaneously.  Psych: Responds to questions appropriately with a normal affect.     Lab results: Basic Metabolic Panel:  Recent Labs Lab 07/18/14 2030 07/19/14 0500  NA 139  --   K 3.6  --   CL 107  --   CO2 23  --   GLUCOSE 122*  --   BUN 9  --   CREATININE 0.94 0.92  CALCIUM 9.2  --     Liver Function Tests: No results for input(s): AST, ALT, ALKPHOS, BILITOT, PROT, ALBUMIN in the last 168 hours. No results for input(s): LIPASE, AMYLASE in the last 168 hours. No results for input(s): AMMONIA in the last 168 hours.  CBC:  Recent Labs Lab 07/18/14 2030 07/19/14 0500  WBC 8.4 7.3  HGB 13.5 12.2*  HCT 41.4 37.8*  MCV 92.2 91.5  PLT 231 223    Cardiac Enzymes:  Recent Labs Lab 07/19/14 0500  TROPONINI <0.03    BNP: Invalid input(s): POCBNP  CBG: No results for input(s): GLUCAP in the last 168 hours.  Coagulation Studies: No results for input(s): LABPROT, INR in the last 72 hours.   Other results: Personal review of EKG  shows :  - NSR, NS ST abn.   Imaging: Dg Chest 2 View  07/18/2014   CLINICAL DATA:  Left-sided chest pain and dyspnea, 2 days duration.  EXAM: CHEST  2 VIEW  COMPARISON:  01/30/2014  FINDINGS: The heart size and mediastinal contours are within normal limits. Both lungs are clear. The visualized skeletal structures are unremarkable.  IMPRESSION: No active cardiopulmonary disease.   Electronically Signed   By: Andreas Newport M.D.   On: 07/18/2014 21:16  Assessment & Plan:  1. Unstable angina:  His symptoms are concerning for UAP.  Had CP , associated with dyspnea, promptly relieved with SL NTG.   He is a smoker, has known PVD.  , + family hx of CAD. I think our best option is to proceed with cath.  I've discussed the risks , benefits, options.  He understands and agrees to proceed.  Troponin levels are negative .  Cath scheduled for today.   2. Essential Hypertension:  Continue amlodipine  3.  Hyperlipidemia:   Lipid levels are ok .  Continue pravachol.     Thayer Headings, Brooke Bonito., MD, [facility] 07/19/2014, 8:51 AM Office - [phone] Pager [phone]

## 2014-07-19 NOTE — H&P (Signed)
[facility] History and Physical  David David QQP:[id number] DOB: 07/04/1950 DOA: 07/18/2014  Referring physician: Dr. Christy Gentles. PCP: No PCP Per Patient [facility]. Specialists: [facility] cardiology.  Chief Complaint: Chest pain.  HPI: David David is a 64 y.o. male with history of peripheral arterial disease, rheumatoid arthritis, hypothyroidism, hypertension, ongoing tobacco abuse presents to the year because of chest pain. Patient has been having chest pain over the last 2 days which is retrosternal persistent present at rest and has no exertional symptoms and no associated diaphoresis nausea vomiting or shortness of breath. Patient's chest pain resolved after receiving sublingual nitroglycerin the ER. Cardiac markers and EKG were unremarkable and patient had been admitted for further management of chest pain given the risk factors.   Review of Systems: As presented in the history of presenting illness, rest negative.  Past Medical History  Diagnosis Date  . Peripheral vascular disease, unspecified     lower extremity peripheral artery disease  . Unspecified hypertensive kidney disease with chronic kidney disease stage I through stage IV, or unspecified     cholesterol embolization syndrome  . GERD (gastroesophageal reflux disease)   . Exposure to STD   . Dysfunction of eustachian tube   . Routine general medical examination at a health care facility   . Family history of psychiatric condition     family Hx of suicide   . Alcoholism in family   . Family history of diabetes mellitus   . Lipoprotein deficiencies   . Impotence of organic origin     erectile dysfunction  . Other diseases of lung, not elsewhere classified     pulmonary nodule  . Sprain of neck     Hx  . Diverticulosis of colon (without mention of hemorrhage)   . Depressive disorder, not elsewhere classified   . Personal history of colonic polyps   . Tobacco use disorder    Past Surgical  History  Procedure Laterality Date  . Angiography      bilateral lower extremity angiography, stenting of the left.   . Popliteal artery angioplasty      didnt specify angioplasty or stent  . Spermatocelectomy      left  . Lower extremity angiogram N/A 11/04/2012    Procedure: LOWER EXTREMITY ANGIOGRAM;  Surgeon: Wellington Hampshire, MD;  Location: [facility];  Service: Cardiovascular;  Laterality: N/A;   Social History:  reports that he has quit smoking. He does not have any smokeless tobacco history on file. He reports that he does not drink alcohol or use illicit drugs. Where does patient live home. Can patient participate in ADLs? Yes.  Allergies  Allergen Reactions  . Neomycin-Bacitracin Zn-Polymyx     unknown  . Niacin     unknown  . Paroxetine     REACTION: goofy    Family History:  Family History  Problem Relation Age of Onset  . CAD Father   . Congestive Heart Failure Mother       Prior to Admission medications   Medication Sig Start Date End Date Taking? Authorizing Provider  amLODipine (NORVASC) 10 MG tablet Take 5 mg by mouth daily.     Yes Historical Provider, MD  aspirin EC 81 MG tablet Take 1 tablet (81 mg total) by mouth daily. 10/09/12  Yes Sherren Mocha, MD  cholecalciferol (VITAMIN D) 1000 UNITS tablet Take 1,000 Units by mouth daily.   Yes Historical Provider, MD  clopidogrel (PLAVIX) 75 MG tablet Take 1 tablet (75  mg total) by mouth daily. Patient taking differently: Take 75 mg by mouth at bedtime.  11/04/12  Yes Wellington Hampshire, MD  cyclobenzaprine (FLEXERIL) 10 MG tablet Take 1 tablet (10 mg total) by mouth at bedtime. 10/28/12  Yes Sherren Mocha, MD  escitalopram (LEXAPRO) 10 MG tablet Take 1 tablet (10 mg total) by mouth 3 (three) times daily. 10/28/12  Yes Sherren Mocha, MD  finasteride (PROSCAR) 5 MG tablet Take 5 mg by mouth at bedtime.   Yes Historical Provider, MD  folic acid (FOLVITE) 1 MG tablet Take 1 mg by mouth daily.    Yes Historical  Provider, MD  gabapentin (NEURONTIN) 300 MG capsule Take 600 mg by mouth 2 (two) times daily.   Yes Historical Provider, MD  HYDROcodone-acetaminophen (NORCO/VICODIN) 5-325 MG per tablet Take 1-2 tablets by mouth every 4 (four) hours as needed. Patient taking differently: Take 1-2 tablets by mouth every 4 (four) hours as needed for moderate pain or severe pain.  01/30/14  Yes Malvin Johns, MD  hydrOXYzine (ATARAX) 50 MG tablet Take 50 mg by mouth 2 (two) times daily.    Yes Historical Provider, MD  levothyroxine (SYNTHROID, LEVOTHROID) 75 MCG tablet Take 1 tablet (75 mcg total) by mouth daily before breakfast. 10/28/12  Yes Sherren Mocha, MD  methotrexate (RHEUMATREX) 2.5 MG tablet Take 12.5 mg by mouth once a week. Caution:Chemotherapy. Protect from light.Take on Friday   Yes Historical Provider, MD  mirtazapine (REMERON) 15 MG tablet Take 15 mg by mouth at bedtime.     Yes Historical Provider, MD  multivitamin [facility]) per tablet Take 1 tablet by mouth daily.     Yes Historical Provider, MD  oxybutynin (DITROPAN) 5 MG tablet Take 5 mg by mouth at bedtime.   Yes Historical Provider, MD  pantoprazole (PROTONIX) 40 MG tablet Take 40 mg by mouth daily.     Yes Historical Provider, MD  pravastatin (PRAVACHOL) 40 MG tablet Take 40 mg by mouth at bedtime.     Yes Historical Provider, MD  prazosin (MINIPRESS) 2 MG capsule Take 2 capsules (4 mg total) by mouth at bedtime. 10/29/12  Yes Sherren Mocha, MD  Thiamine HCl (VITAMIN B-1) 250 MG tablet Take 250 mg by mouth daily.     Yes Historical Provider, MD  vitamin E 600 UNIT capsule Take 600 Units by mouth daily.     Yes Historical Provider, MD  ciprofloxacin (CIPRO) 500 MG tablet Take 1 tablet (500 mg total) by mouth 2 (two) times daily. One po bid x 7 days Patient not taking: Reported on 07/18/2014 01/30/14   Malvin Johns, MD  fluconazole (DIFLUCAN) 100 MG tablet Take 1 tablet (100 mg total) by mouth daily. Patient not taking: Reported on 07/18/2014  12/21/13   Wellington Hampshire, MD  metroNIDAZOLE (FLAGYL) 500 MG tablet Take 1 tablet (500 mg total) by mouth 2 (two) times daily. One po bid x 7 days Patient not taking: Reported on 07/18/2014 01/30/14   Malvin Johns, MD  ondansetron (ZOFRAN ODT) 8 MG disintegrating tablet '8mg'$  ODT q4 hours prn nausea Patient not taking: Reported on 07/18/2014 01/30/14   Malvin Johns, MD    Physical Exam: Filed Vitals:   07/19/14 0025 07/19/14 0031 07/19/14 0115 07/19/14 0146  BP: 119/77 123/69 114/61 125/79  Pulse: 58 57 56 67  Temp:    98.4 F (36.9 C)  TempSrc:    Oral  Resp: '12 15 16 16  '$ Height:    '6\' 4"'$  (1.93 m)  Weight:  77.747 kg (171 lb 6.4 oz)  SpO2: 99% 96% 96% 100%     General:  Moderately built and nourished.  Eyes: Anicteric no pallor.  ENT: No discharge from the ears eyes nose or mouth.  Neck: No mass felt.  Cardiovascular: S1 and S2 heard.  Respiratory: No rhonchi or crepitations.  Abdomen: Soft nontender bowel sounds present.  Skin: No rash.  Musculoskeletal: No edema.  Psychiatric: Appears normal.  Neurologic: Alert awake oriented to time place and person. Moves all extremities.  Labs on Admission:  Basic Metabolic Panel:  Recent Labs Lab 07/18/14 2030  NA 139  K 3.6  CL 107  CO2 23  GLUCOSE 122*  BUN 9  CREATININE 0.94  CALCIUM 9.2   Liver Function Tests: No results for input(s): AST, ALT, ALKPHOS, BILITOT, PROT, ALBUMIN in the last 168 hours. No results for input(s): LIPASE, AMYLASE in the last 168 hours. No results for input(s): AMMONIA in the last 168 hours. CBC:  Recent Labs Lab 07/18/14 2030  WBC 8.4  HGB 13.5  HCT 41.4  MCV 92.2  PLT 231   Cardiac Enzymes: No results for input(s): CKTOTAL, CKMB, CKMBINDEX, TROPONINI in the last 168 hours.  BNP (last 3 results)  Recent Labs  07/18/14 2030  BNP 29.2    ProBNP (last 3 results) No results for input(s): PROBNP in the last 8760 hours.  CBG: No results for input(s): GLUCAP in the  last 168 hours.  Radiological Exams on Admission: Dg Chest 2 View  07/18/2014   CLINICAL DATA:  Left-sided chest pain and dyspnea, 2 days duration.  EXAM: CHEST  2 VIEW  COMPARISON:  01/30/2014  FINDINGS: The heart size and mediastinal contours are within normal limits. Both lungs are clear. The visualized skeletal structures are unremarkable.  IMPRESSION: No active cardiopulmonary disease.   Electronically Signed   By: Andreas Newport M.D.   On: 07/18/2014 21:16    EKG: Independently reviewed. Normal sinus rhythm.  Assessment/Plan Principal Problem:   Chest pain Active Problems:   Essential hypertension   Hyperlipidemia   Hypothyroidism   1. Chest pain - given the multiple risk factors at this time patient will be kept nothing by mouth in anticipation of cardiac procedure. Cycle cardiac markers. Patient is already on aspirin and Plavix which will be continued. Check 2-D echo. Consult cardiology. 2. Hypertension - continue home medication. 3. Hypothyroidism - continue home medications. 4. Tobacco abuse - patient advised to quit smoking. 5. History of rheumatoid arthritis on methotrexate. 6. Peripheral arterial disease on antiplatelet agents. 7. Hyperlipidemia on statins.   DVT Prophylaxis Lovenox.  Code Status: Full code.  Family Communication: Discussed with patient.  Disposition Plan: Admit for observation.    Deldrick Linch N. [facility] Pager [phone].  If 7PM-7AM, please contact night-coverage [email or web address] Password [email or web address] 07/19/2014, 4:08 AM

## 2014-07-20 ENCOUNTER — Encounter (HOSPITAL_COMMUNITY): Payer: Self-pay | Admitting: Cardiology

## 2014-07-20 DIAGNOSIS — R0789 Other chest pain: Secondary | ICD-10-CM

## 2014-07-20 DIAGNOSIS — R079 Chest pain, unspecified: Secondary | ICD-10-CM | POA: Diagnosis not present

## 2014-07-20 DIAGNOSIS — I10 Essential (primary) hypertension: Secondary | ICD-10-CM | POA: Diagnosis not present

## 2014-07-20 DIAGNOSIS — E785 Hyperlipidemia, unspecified: Secondary | ICD-10-CM | POA: Diagnosis not present

## 2014-07-20 DIAGNOSIS — E039 Hypothyroidism, unspecified: Secondary | ICD-10-CM | POA: Diagnosis not present

## 2014-07-20 LAB — BASIC METABOLIC PANEL
Anion gap: 8 (ref 5–15)
BUN: 11 mg/dL (ref 6–20)
CHLORIDE: 107 mmol/L (ref 101–111)
CO2: 25 mmol/L (ref 22–32)
Calcium: 8.7 mg/dL — ABNORMAL LOW (ref 8.9–10.3)
Creatinine, Ser: 0.93 mg/dL (ref 0.61–1.24)
Glucose, Bld: 108 mg/dL — ABNORMAL HIGH (ref 70–99)
Potassium: 3.9 mmol/L (ref 3.5–5.1)
Sodium: 140 mmol/L (ref 135–145)

## 2014-07-20 NOTE — Progress Notes (Signed)
Patient ID: David David, male   DOB: 1950/10/10, 64 y.o.   MRN: [id number]    Subjective:  Denies SSCP, palpitations or Dyspnea   Objective:  Filed Vitals:   07/19/14 2100 07/19/14 2147 07/19/14 2248 07/20/14 0618  BP:  133/71 105/89 119/65  Pulse:  65 70 66  Temp: 98.6 F (37 C)   97.8 F (36.6 C)  TempSrc:    Oral  Resp:      Height:      Weight:      SpO2:  98% 97% 95%    Intake/Output from previous day:  Intake/Output Summary (Last 24 hours) at 07/20/14 0843 Last data filed at 07/20/14 4098  Gross per 24 hour  Intake      0 ml  Output   1600 ml  Net  -1600 ml    Physical Exam: Affect appropriate Healthy:  appears stated age HEENT: normal Neck supple with no adenopathy JVP normal no bruits no thyromegaly Lungs clear with no wheezing and good diaphragmatic motion Heart:  S1/S2 no murmur, no rub, gallop or click PMI normal Abdomen: benighn, BS positve, no tenderness, no AAA no bruit.  No HSM or HJR Distal pulses intact with no bruits No edema Neuro non-focal Skin warm and dry No muscular weakness Right radial A   Lab Results: Basic Metabolic Panel:  Recent Labs  07/18/14 2030 07/19/14 0500 07/20/14 0436  NA 139  --  140  K 3.6  --  3.9  CL 107  --  107  CO2 23  --  25  GLUCOSE 122*  --  108*  BUN 9  --  11  CREATININE 0.94 0.92 0.93  CALCIUM 9.2  --  8.7*   Liver Function Tests: No results for input(s): AST, ALT, ALKPHOS, BILITOT, PROT, ALBUMIN in the last 72 hours. No results for input(s): LIPASE, AMYLASE in the last 72 hours. CBC:  Recent Labs  07/18/14 2030 07/19/14 0500  WBC 8.4 7.3  HGB 13.5 12.2*  HCT 41.4 37.8*  MCV 92.2 91.5  PLT 231 223   Cardiac Enzymes:  Recent Labs  07/19/14 0500 07/19/14 1134 07/19/14 1627  TROPONINI <0.03 <0.03 <0.03   BNP: Invalid input(s): POCBNP D-Dimer: No results for input(s): DDIMER in the last 72 hours. Hemoglobin A1C: No results for input(s): HGBA1C in the last 72  hours. Fasting Lipid Panel: No results for input(s): CHOL, HDL, LDLCALC, TRIG, CHOLHDL, LDLDIRECT in the last 72 hours. Thyroid Function Tests: No results for input(s): TSH, T4TOTAL, T3FREE, THYROIDAB in the last 72 hours.  Invalid input(s): FREET3 Anemia Panel: No results for input(s): VITAMINB12, FOLATE, FERRITIN, TIBC, IRON, RETICCTPCT in the last 72 hours.  Imaging: Dg Chest 2 View  07/18/2014   CLINICAL DATA:  Left-sided chest pain and dyspnea, 2 days duration.  EXAM: CHEST  2 VIEW  COMPARISON:  01/30/2014  FINDINGS: The heart size and mediastinal contours are within normal limits. Both lungs are clear. The visualized skeletal structures are unremarkable.  IMPRESSION: No active cardiopulmonary disease.   Electronically Signed   By: Andreas Newport M.D.   On: 07/18/2014 21:16    Cardiac Studies:  ECG:  SR no acute ST changes   Telemetry:  NSR  07/20/2014   Echo:   Medications:   . amLODipine  5 mg Oral Daily  . aspirin EC  81 mg Oral Daily  . cholecalciferol  1,000 Units Oral Daily  . clopidogrel  75 mg Oral QHS  . cyclobenzaprine  10 mg  Oral QHS  . enoxaparin (LOVENOX) injection  40 mg Subcutaneous Daily  . escitalopram  10 mg Oral TID  . finasteride  5 mg Oral QHS  . folic acid  1 mg Oral Daily  . gabapentin  600 mg Oral BID  . hydrOXYzine  50 mg Oral BID  . levothyroxine  75 mcg Oral QAC breakfast  . [START ON 07/22/2014] methotrexate  12.5 mg Oral Q Fri  . mirtazapine  15 mg Oral QHS  . multivitamin with minerals  1 tablet Oral Daily  . oxybutynin  5 mg Oral QHS  . pantoprazole  40 mg Oral Daily  . pravastatin  40 mg Oral QHS  . prazosin  4 mg Oral QHS  . sodium chloride  3 mL Intravenous Q12H  . vitamin B-1  250 mg Oral Daily  . vitamin E  400 Units Oral Daily     . sodium chloride      Assessment/Plan:  CAD:  Moderate OM disease 60%  D/c smoking medical Rx outpatient f/u Dr Burt Knack HTN:  Continue amlodipine  Chol:  On statin   Medical f/u [facility] for d/c today   Jenkins Rouge 07/20/2014, 8:43 AM

## 2014-07-20 NOTE — Discharge Summary (Signed)
PATIENT DETAILS Name: David David Age: 64 y.o. Sex: male Date of Birth: Jun 22, 1950 MRN: [id number]. Admitting Physician: Rise Patience, MD PCP:No PCP Per Patient  Admit Date: 07/18/2014 Discharge date: 07/20/2014  Recommendations for Outpatient Follow-up:  1. Continue to counsel regarding importance of stopping tobacco abuse. 2. Periodic CBC/chemistry is with PCP  PRIMARY DISCHARGE DIAGNOSIS:  Principal Problem:   Chest pain Active Problems:   Essential hypertension   Hyperlipidemia   Hypothyroidism   Chest pain, rule out acute myocardial infarction   Malnutrition of moderate degree   Unstable angina   PAD (peripheral artery disease)      PAST MEDICAL HISTORY: Past Medical History  Diagnosis Date  . Peripheral vascular disease, unspecified     lower extremity peripheral artery disease  . Unspecified hypertensive kidney disease with chronic kidney disease stage I through stage IV, or unspecified     cholesterol embolization syndrome  . GERD (gastroesophageal reflux disease)   . Exposure to STD   . Dysfunction of eustachian tube   . Routine general medical examination at a health care facility   . Family history of psychiatric condition     family Hx of suicide   . Alcoholism in family   . Family history of diabetes mellitus   . Lipoprotein deficiencies   . Impotence of organic origin     erectile dysfunction  . Other diseases of lung, not elsewhere classified     pulmonary nodule  . Sprain of neck     Hx  . Diverticulosis of colon (without mention of hemorrhage)   . Depressive disorder, not elsewhere classified   . Personal history of colonic polyps   . Tobacco use disorder     DISCHARGE MEDICATIONS: Current Discharge Medication List    CONTINUE these medications which have NOT CHANGED   Details  amLODipine (NORVASC) 10 MG tablet Take 5 mg by mouth daily.      aspirin EC 81 MG tablet Take 1 tablet (81 mg total) by mouth daily. Qty: 90 tablet,  Refills: 3   Associated Diagnoses: Peripheral vascular disease, unspecified; PAD (peripheral artery disease); Atheromatous embolus of lower extremity, unspecified laterality    cholecalciferol (VITAMIN D) 1000 UNITS tablet Take 1,000 Units by mouth daily.    clopidogrel (PLAVIX) 75 MG tablet Take 1 tablet (75 mg total) by mouth daily. Qty: 30 tablet, Refills: 3    cyclobenzaprine (FLEXERIL) 10 MG tablet Take 1 tablet (10 mg total) by mouth at bedtime. Qty: 30 tablet    escitalopram (LEXAPRO) 10 MG tablet Take 1 tablet (10 mg total) by mouth 3 (three) times daily.    finasteride (PROSCAR) 5 MG tablet Take 5 mg by mouth at bedtime.    folic acid (FOLVITE) 1 MG tablet Take 1 mg by mouth daily.     gabapentin (NEURONTIN) 300 MG capsule Take 600 mg by mouth 2 (two) times daily.    hydrOXYzine (ATARAX) 50 MG tablet Take 50 mg by mouth 2 (two) times daily.     levothyroxine (SYNTHROID, LEVOTHROID) 75 MCG tablet Take 1 tablet (75 mcg total) by mouth daily before breakfast.    methotrexate (RHEUMATREX) 2.5 MG tablet Take 12.5 mg by mouth once a week. Caution:Chemotherapy. Protect from light.Take on Friday    mirtazapine (REMERON) 15 MG tablet Take 15 mg by mouth at bedtime.      multivitamin (THERAGRAN) per tablet Take 1 tablet by mouth daily.      oxybutynin (DITROPAN) 5 MG tablet Take 5  mg by mouth at bedtime.    pantoprazole (PROTONIX) 40 MG tablet Take 40 mg by mouth daily.      pravastatin (PRAVACHOL) 40 MG tablet Take 40 mg by mouth at bedtime.      prazosin (MINIPRESS) 2 MG capsule Take 2 capsules (4 mg total) by mouth at bedtime.    Thiamine HCl (VITAMIN B-1) 250 MG tablet Take 250 mg by mouth daily.      vitamin E 600 UNIT capsule Take 600 Units by mouth daily.      ondansetron (ZOFRAN ODT) 8 MG disintegrating tablet '8mg'$  ODT q4 hours prn nausea Qty: 10 tablet, Refills: 0      STOP taking these medications     HYDROcodone-acetaminophen (NORCO/VICODIN) 5-325 MG per tablet       ciprofloxacin (CIPRO) 500 MG tablet      fluconazole (DIFLUCAN) 100 MG tablet      metroNIDAZOLE (FLAGYL) 500 MG tablet         ALLERGIES:   Allergies  Allergen Reactions  . Neomycin-Bacitracin Zn-Polymyx     unknown  . Niacin     unknown  . Paroxetine     REACTION: goofy    BRIEF HPI:  See H&P, Labs, Consult and Test reports for all details in brief, patient is a 64 year old male with known history of tobacco abuse, peripheral vascular disease was admitted for evaluation of chest pain.  CONSULTATIONS:   cardiology  PERTINENT RADIOLOGIC STUDIES: Dg Chest 2 View  07/18/2014   CLINICAL DATA:  Left-sided chest pain and dyspnea, 2 days duration.  EXAM: CHEST  2 VIEW  COMPARISON:  01/30/2014  FINDINGS: The heart size and mediastinal contours are within normal limits. Both lungs are clear. The visualized skeletal structures are unremarkable.  IMPRESSION: No active cardiopulmonary disease.   Electronically Signed   By: Andreas Newport M.D.   On: 07/18/2014 21:16     PERTINENT LAB RESULTS: CBC:  Recent Labs  07/18/14 2030 07/19/14 0500  WBC 8.4 7.3  HGB 13.5 12.2*  HCT 41.4 37.8*  PLT 231 223   CMET CMP     Component Value Date/Time   NA 140 07/20/2014 0436   K 3.9 07/20/2014 0436   CL 107 07/20/2014 0436   CO2 25 07/20/2014 0436   GLUCOSE 108* 07/20/2014 0436   BUN 11 07/20/2014 0436   CREATININE 0.93 07/20/2014 0436   CALCIUM 8.7* 07/20/2014 0436   PROT 7.4 01/30/2014 2055   ALBUMIN 3.9 01/30/2014 2055   AST 28 01/30/2014 2055   ALT 16 01/30/2014 2055   ALKPHOS 111 01/30/2014 2055   BILITOT 0.3 01/30/2014 2055   GFRNONAA >60 07/20/2014 0436   GFRAA >60 07/20/2014 0436    GFR Estimated Creatinine Clearance: 87 mL/min (by C-G formula based on Cr of 0.93). No results for input(s): LIPASE, AMYLASE in the last 72 hours.  Recent Labs  07/19/14 0500 07/19/14 1134 07/19/14 1627  TROPONINI <0.03 <0.03 <0.03   Invalid input(s): POCBNP No results  for input(s): DDIMER in the last 72 hours. No results for input(s): HGBA1C in the last 72 hours. No results for input(s): CHOL, HDL, LDLCALC, TRIG, CHOLHDL, LDLDIRECT in the last 72 hours. No results for input(s): TSH, T4TOTAL, T3FREE, THYROIDAB in the last 72 hours.  Invalid input(s): FREET3 No results for input(s): VITAMINB12, FOLATE, FERRITIN, TIBC, IRON, RETICCTPCT in the last 72 hours. Coags:  Recent Labs  07/19/14 1229  INR 1.05   Microbiology: No results found for this or any previous visit (  from the past 240 hour(s)).   BRIEF HOSPITAL COURSE:  Chest pain: Suspect unstable angina, given typical features, seen by cardiology, subsequently underwent cardiac catheterization which showed a 60% OM lesion not felt to be the culprit. He has had no further chest pain during his stay. He was followed by cardiology today, recommendations are to discharge home. We have asked him to stop smoking, we offered him a nicotine patch which he refused. He will continue on his usual antiplatelet regimen and the above other medications.  Active Problems:  Essential hypertension:controlled with amlodipine and prazosin.   Hyperlipidemia: Continue with statin   Hypothyroidism: Continue with levothyroxine   History of peripheral vascular disease: Appears stable-lower extremities warm   History of rheumatoid arthritis: Continue with methotrexate on discharge   History of BPH: Continue with Proscar and prazosin   Tobacco abuse: Counseled-seems motivated to quit-refused transdermal nicotine.  TODAY-DAY OF DISCHARGE:  Subjective:   Rino Hosea today has no headache,no chest abdominal pain,no new weakness tingling or numbness, feels much better wants to go home today.  Objective:   Blood pressure 119/65, pulse 66, temperature 97.8 F (36.6 C), temperature source Oral, resp. rate 0, height '6\' 4"'$  (1.93 m), weight 77.747 kg (171 lb 6.4 oz), SpO2 95 %.  Intake/Output Summary (Last 24  hours) at 07/20/14 0858 Last data filed at 07/20/14 1610  Gross per 24 hour  Intake      0 ml  Output   1600 ml  Net  -1600 ml   Filed Weights   07/19/14 0146  Weight: 77.747 kg (171 lb 6.4 oz)    Exam Awake Alert, Oriented *3, No new F.N deficits, Normal affect [place].AT,PERRAL Supple Neck,No JVD, No cervical lymphadenopathy appriciated.  Symmetrical Chest wall movement, Good air movement bilaterally, CTAB RRR,No Gallops,Rubs or new Murmurs, No Parasternal Heave +ve B.Sounds, Abd Soft, Non tender, No organomegaly appriciated, No rebound -guarding or rigidity. No Cyanosis, Clubbing or edema, No new Rash or bruise  DISCHARGE CONDITION: Stable  DISPOSITION: Home  DISCHARGE INSTRUCTIONS:    Activity:  As tolerated  Diet recommendation: Heart Healthy diet  Discharge Instructions    Call MD for:  severe uncontrolled pain    Complete by:  As directed      Diet - low sodium heart healthy    Complete by:  As directed      Increase activity slowly    Complete by:  As directed            Follow-up Information    Schedule an appointment as soon as possible for a visit in 1 week to follow up.   Contact information:   PCP at [facility]      Follow up with Kathlyn Sacramento, MD. Schedule an appointment as soon as possible for a visit in 2 weeks.   Specialty:  Cardiology   Contact information:   [address] [address] [phone]         Total Time spent on discharge equals 25  minutes.  SignedOren Binet 07/20/2014 8:58 AM

## 2014-07-20 NOTE — Discharge Instructions (Signed)
Radial Site Care °Refer to this sheet in the next few weeks. These instructions provide you with information on caring for yourself after your procedure. Your caregiver may also give you more specific instructions. Your treatment has been planned according to current medical practices, but problems sometimes occur. Call your caregiver if you have any problems or questions after your procedure. °HOME CARE INSTRUCTIONS °· You may shower the day after the procedure. Remove the bandage (dressing) and gently wash the site with plain soap and water. Gently pat the site dry. °· Do not apply powder or lotion to the site. °· Do not submerge the affected site in water for 3 to 5 days. °· Inspect the site at least twice daily. °· Do not flex or bend the affected arm for 24 hours. °· No lifting over 5 pounds (2.3 kg) for 5 days after your procedure. °· Do not drive home if you are discharged the same day of the procedure. Have someone else drive you. °· You may drive 24 hours after the procedure unless otherwise instructed by your caregiver. °· Do not operate machinery or power tools for 24 hours. °· A responsible adult should be with you for the first 24 hours after you arrive home. °What to expect: °· Any bruising will usually fade within 1 to 2 weeks. °· Blood that collects in the tissue (hematoma) may be painful to the touch. It should usually decrease in size and tenderness within 1 to 2 weeks. °SEEK IMMEDIATE MEDICAL CARE IF: °· You have unusual pain at the radial site. °· You have redness, warmth, swelling, or pain at the radial site. °· You have drainage (other than a small amount of blood on the dressing). °· You have chills. °· You have a fever or persistent symptoms for more than 72 hours. °· You have a fever and your symptoms suddenly get worse. °· Your arm becomes pale, cool, tingly, or numb. °· You have heavy bleeding from the site. Hold pressure on the site. °Document Released: 04/06/2010 Document Revised:  05/27/2011 Document Reviewed: 04/06/2010 °ExitCare® Patient Information ©2015 [employer]. This information is not intended to replace advice given to you by your health care provider. Make sure you discuss any questions you have with your health care provider. ° °

## 2014-08-09 ENCOUNTER — Ambulatory Visit (INDEPENDENT_AMBULATORY_CARE_PROVIDER_SITE_OTHER): Payer: Medicare Other | Admitting: Cardiovascular Disease

## 2014-08-09 ENCOUNTER — Encounter: Payer: Self-pay | Admitting: Cardiovascular Disease

## 2014-08-09 VITALS — BP 128/70 | HR 85 | Ht 76.0 in | Wt 178.1 lb

## 2014-08-09 DIAGNOSIS — I739 Peripheral vascular disease, unspecified: Secondary | ICD-10-CM

## 2014-08-09 DIAGNOSIS — R1011 Right upper quadrant pain: Secondary | ICD-10-CM

## 2014-08-09 DIAGNOSIS — I10 Essential (primary) hypertension: Secondary | ICD-10-CM | POA: Diagnosis not present

## 2014-08-09 DIAGNOSIS — E785 Hyperlipidemia, unspecified: Secondary | ICD-10-CM

## 2014-08-09 NOTE — Patient Instructions (Addendum)
Medication Instructions:  Your physician recommends that you continue on your current medications as directed. Please refer to the Current Medication list given to you today.   Labwork: none  Testing/Procedures: Have abdominal ultrasound done at [facility] to evaluate right upper quadrant pain and evaluate gall bladder. Located at [facility].  [facility] physician has requested that you have a left lower   arterial duplex. This test is an ultrasound of the arteries in the legs. It looks at arterial blood flow in the legs and arms. Allow one hour for Lower  Arterial scans. There are no restrictions or special instructions.  To be done in 6 months on day of appt with Dr. Fletcher Anon   Follow-Up: Your physician wants you to follow-up in:  6 months with Dr. Fletcher Anon.  You will receive a reminder letter in the mail two months in advance. If you don't receive a letter, please call our office to schedule the follow-up appointment.

## 2014-08-09 NOTE — Assessment & Plan Note (Signed)
Blood pressure is well controlled on current medications. 

## 2014-08-09 NOTE — Progress Notes (Signed)
HPI:  64 year old presenting for followup evaluation. The patient has lower extremity peripheral arterial disease with recurrent atheroembolic events to the left foot due to distal left popliteal artery stenosis with previous Cypher drug-eluting stent placement which was used in order to treat an ulcerated plaque with distal embolization. He had presentation and 2011 and was found to have distal edge restenosis extending into the ostium anterior tibial and tibioperoneal trunk. He had balloon angioplasty done with resolution of symptoms. He was  seen in 11/2012  for a painless rash involving the left great toe and medial foot, with typical appearance of an athero embolic event. Echocardiogram was unremarkable.  CTA of the chest abdomen and pelvis with runoff to the feet demonstrated scattered atheromatous the aorta but no significant findings.  The left tibioperoneal stent had greater than 70% stenosis. ABI was normal but there was significant dampening in pressure noted in the big toe. I proceeded with angiography which confirmed this and also showed an occluded dorsalis pedis likely from embolization. I performed balloon angioplasty with an Angioscore balloon to both ostial anterior tibial and ostial tibioperoneal trunk with good results. Before the angiogram, the patient had worsening of black discoloration of tip of the big toe. After the procedure, the patient had resolution of symptoms. He was seen last year for right ingrown nail with a small ulceration. ABI and duplex on the right side were completely normal. This improved with conservative measures after he saw podiatry. He denies any claudication or ulceration. He was hospitalized recently at [name] for chest pain worrisome for unstable angina. He ruled out for myocardial infarction. He underwent cardiac catheterization which showed no evidence of obstructive disease. He is not complaining of right upper quadrant abdominal pain which is random and  not related to food.    Outpatient Encounter Prescriptions as of 08/09/2014  Medication Sig  . amLODipine (NORVASC) 10 MG tablet Take 5 mg by mouth daily.    Marland Kitchen aspirin EC 81 MG tablet Take 1 tablet (81 mg total) by mouth daily.  . cholecalciferol (VITAMIN D) 1000 UNITS tablet Take 1,000 Units by mouth daily.  . clopidogrel (PLAVIX) 75 MG tablet Take 1 tablet (75 mg total) by mouth daily. (Patient taking differently: Take 75 mg by mouth at bedtime. )  . cyclobenzaprine (FLEXERIL) 10 MG tablet Take 1 tablet (10 mg total) by mouth at bedtime.  Marland Kitchen escitalopram (LEXAPRO) 10 MG tablet Take 1 tablet (10 mg total) by mouth 3 (three) times daily.  . finasteride (PROSCAR) 5 MG tablet Take 5 mg by mouth at bedtime.  . folic acid (FOLVITE) 1 MG tablet Take 1 mg by mouth daily.   Marland Kitchen gabapentin (NEURONTIN) 300 MG capsule Take 600 mg by mouth 2 (two) times daily.  . hydrOXYzine (ATARAX) 50 MG tablet Take 50 mg by mouth 2 (two) times daily.   Marland Kitchen levothyroxine (SYNTHROID, LEVOTHROID) 75 MCG tablet Take 1 tablet (75 mcg total) by mouth daily before breakfast.  . methotrexate (RHEUMATREX) 2.5 MG tablet Take 12.5 mg by mouth once a week. Caution:Chemotherapy. Protect from light.Take on Friday  . mirtazapine (REMERON) 15 MG tablet Take 15 mg by mouth at bedtime.    . multivitamin (THERAGRAN) per tablet Take 1 tablet by mouth daily.    Marland Kitchen oxybutynin (DITROPAN) 5 MG tablet Take 5 mg by mouth at bedtime.  . pantoprazole (PROTONIX) 40 MG tablet Take 40 mg by mouth daily.    Marland Kitchen PRAVASTATIN SODIUM PO Take 75 mg by  mouth daily.  . prazosin (MINIPRESS) 2 MG capsule Take 2 capsules (4 mg total) by mouth at bedtime.  . Thiamine HCl (VITAMIN B-1) 250 MG tablet Take 250 mg by mouth daily.    . vitamin E 600 UNIT capsule Take 600 Units by mouth daily.    . [DISCONTINUED] ondansetron (ZOFRAN ODT) 8 MG disintegrating tablet '8mg'$  ODT q4 hours prn nausea (Patient not taking: Reported on 08/09/2014)  . [DISCONTINUED] pravastatin  (PRAVACHOL) 40 MG tablet Take 40 mg by mouth at bedtime.     No facility-administered encounter medications on file as of 08/09/2014.    Allergies  Allergen Reactions  . Neomycin-Bacitracin Zn-Polymyx     unknown  . Niacin     unknown  . Paroxetine     REACTION: goofy    Past Medical History  Diagnosis Date  . Peripheral vascular disease, unspecified     lower extremity peripheral artery disease  . Unspecified hypertensive kidney disease with chronic kidney disease stage I through stage IV, or unspecified     cholesterol embolization syndrome  . GERD (gastroesophageal reflux disease)   . Exposure to STD   . Dysfunction of eustachian tube   . Routine general medical examination at a health care facility   . Family history of psychiatric condition     family Hx of suicide   . Alcoholism in family   . Family history of diabetes mellitus   . Lipoprotein deficiencies   . Impotence of organic origin     erectile dysfunction  . Other diseases of lung, not elsewhere classified     pulmonary nodule  . Sprain of neck     Hx  . Diverticulosis of colon (without mention of hemorrhage)   . Depressive disorder, not elsewhere classified   . Personal history of colonic polyps   . Tobacco use disorder     ROS: Negative except as per HPI  BP 128/70 mmHg  Pulse 85  Ht '6\' 4"'$  (1.93 m)  Wt 178 lb 1.9 oz (80.795 kg)  BMI 21.69 kg/m2  PHYSICAL EXAM: Pt is alert and oriented, NAD HEENT: normal Neck: JVP - normal, carotids 2+= without bruits Lungs: CTA bilaterally CV: RRR without murmur or gallop Abd: soft, NT, Positive BS, no hepatomegaly Ext: no C/C/E. dorsalis pedis is  normal palpable bilaterally. Posterior tibial is palpable On the right side but not the left side .  Skin: warm/dry no rash   ------------------------------------------------------------    Assessment and plan:

## 2014-08-09 NOTE — Assessment & Plan Note (Signed)
I requested an abdominal ultrasound to evaluate for gallbladder disease.

## 2014-08-09 NOTE — Assessment & Plan Note (Signed)
He is doing very well with no claudication or ulceration. Distal pulses are palpable. Continue lifelong dual antiplatelets therapy. Follow-up and check left lower extremity arterial duplex in 6 months.

## 2014-08-09 NOTE — Assessment & Plan Note (Signed)
Continue treatment with pravastatin with a target LDL of less than 70. This is being managed at the [facility].

## 2014-08-16 ENCOUNTER — Ambulatory Visit
Admission: RE | Admit: 2014-08-16 | Discharge: 2014-08-16 | Disposition: A | Discharge status: Home / Self Care | Payer: Medicare Other | Source: Ambulatory Visit | Attending: Cardiovascular Disease | Admitting: Cardiovascular Disease

## 2014-08-16 DIAGNOSIS — R1011 Right upper quadrant pain: Secondary | ICD-10-CM | POA: Diagnosis not present

## 2014-08-23 ENCOUNTER — Telehealth: Payer: Self-pay | Admitting: Cardiovascular Disease

## 2014-08-23 NOTE — Telephone Encounter (Signed)
Pt rtn call to Herman re results-pls call

## 2014-08-23 NOTE — Telephone Encounter (Signed)
Left message to call back  

## 2014-08-24 NOTE — Telephone Encounter (Signed)
Will forward to Lauren, Dr. Antionette Char nurse for follow up

## 2014-08-24 NOTE — Telephone Encounter (Signed)
I spoke with the pt and made him aware of results. The pt does not have a local PCP but he is followed at the [place]. The pt said per the [place] he does have a history of fatty liver. I made the pt aware that I will mail him a copy of this test and he can take it to his physician at the [place]. Pt agreed with plan.    Notes Recorded by Wellington Hampshire, MD on 08/18/2014 at 2:52 PM Abdominal ultrasound showed no evidence of gallbladder disease. The liver appearance appeared to be abnormal likely due to fatty infiltration which is usually not serious. I want him to follow-up with his primary care physician to discuss this.

## 2014-09-12 ENCOUNTER — Other Ambulatory Visit: Payer: Self-pay

## 2014-10-18 ENCOUNTER — Ambulatory Visit (INDEPENDENT_AMBULATORY_CARE_PROVIDER_SITE_OTHER): Payer: Medicare Other | Admitting: Physician Assistant

## 2014-10-18 VITALS — BP 112/70 | HR 66 | Temp 98.3°F | Resp 18 | Ht 74.5 in | Wt 179.2 lb

## 2014-10-18 DIAGNOSIS — T300 Burn of unspecified body region, unspecified degree: Secondary | ICD-10-CM

## 2014-10-18 MED ORDER — SILVER SULFADIAZINE 1 % EX CREA: 1.0000 "application " | TOPICAL_CREAM | Freq: Two times a day (BID) | CUTANEOUS | Status: DC

## 2014-10-18 NOTE — Progress Notes (Signed)
[facility] [address], [place] [address] [phone]  Date:  10/18/2014   Name:  David David   DOB:  12-01-1950   MRN:  [id number]  PCP:  No PCP Per Patient    Chief Complaint: Burn   History of Present Illness:  This is a 64 y.o. male with PMH HTN, HLD, PAD, hypothyroidism who is presenting with a thermal burn on his left medial ankle that occurred 4 days ago. He was using a leaf blower in the yard and his medial ankle came into contact with the muffler. He has been cleaning with hydrogen peroxide since then. He has been experiencing some left foot and ankle swelling. He denies pain stating that he does not have a lot of feeling below the knee anyway d/t PAD. He is here today because "my girlfriend made me". He denies fever or chills.  Review of Systems:  Review of Systems See HPI  Patient Active Problem List   Diagnosis Date Noted  . Right upper quadrant abdominal pain 08/09/2014  . Chest pain 07/19/2014  . Essential hypertension 07/19/2014  . Hyperlipidemia 07/19/2014  . Hypothyroidism 07/19/2014  . Malnutrition of moderate degree 07/19/2014  . Unstable angina   . PAD (peripheral artery disease)   . TOBACCO ABUSE 09/27/2008  . CHOLESTEROL EMBOLIZATION SYNDROME 09/27/2008  . PERIPHERAL VASCULAR DISEASE 09/27/2008  . GERD 09/27/2008  . DYSFUNCTION, EUSTACHIAN TUBE 10/09/2006  . LOW HDL 07/28/2006  . DEPRESSION 07/28/2006  . PULMONARY NODULE 07/28/2006  . DIVERTICULOSIS, COLON 07/28/2006  . ERECTILE DYSFUNCTION, ORGANIC 07/28/2006  . SPRAIN/STRAIN, NECK 07/28/2006  . COLONIC POLYPS, HX OF 07/28/2006    Prior to Admission medications   Medication Sig Start Date End Date Taking? Authorizing Provider  amLODipine (NORVASC) 10 MG tablet Take 5 mg by mouth daily.     Yes Historical Provider, MD  aspirin EC 81 MG tablet Take 1 tablet (81 mg total) by mouth daily. 10/09/12  Yes Sherren Mocha, MD  cholecalciferol (VITAMIN D) 1000 UNITS tablet  Take 1,000 Units by mouth daily.   Yes Historical Provider, MD  clopidogrel (PLAVIX) 75 MG tablet Take 1 tablet (75 mg total) by mouth daily. Patient taking differently: Take 75 mg by mouth at bedtime.  11/04/12  Yes Wellington Hampshire, MD  cyclobenzaprine (FLEXERIL) 10 MG tablet Take 1 tablet (10 mg total) by mouth at bedtime. 10/28/12  Yes Sherren Mocha, MD  escitalopram (LEXAPRO) 10 MG tablet Take 1 tablet (10 mg total) by mouth 3 (three) times daily. 10/28/12  Yes Sherren Mocha, MD  finasteride (PROSCAR) 5 MG tablet Take 5 mg by mouth at bedtime.   Yes Historical Provider, MD  folic acid (FOLVITE) 1 MG tablet Take 1 mg by mouth daily.    Yes Historical Provider, MD  gabapentin (NEURONTIN) 300 MG capsule Take 600 mg by mouth 2 (two) times daily.   Yes Historical Provider, MD  hydrOXYzine (ATARAX) 50 MG tablet Take 50 mg by mouth 2 (two) times daily.    Yes Historical Provider, MD  levothyroxine (SYNTHROID, LEVOTHROID) 75 MCG tablet Take 1 tablet (75 mcg total) by mouth daily before breakfast. 10/28/12  Yes Sherren Mocha, MD  methotrexate (RHEUMATREX) 2.5 MG tablet Take 12.5 mg by mouth once a week. Caution:Chemotherapy. Protect from light.Take on Friday   Yes Historical Provider, MD  mirtazapine (REMERON) 15 MG tablet Take 15 mg by mouth at bedtime.     Yes Historical Provider, MD  multivitamin [facility]) per tablet  Take 1 tablet by mouth daily.     Yes Historical Provider, MD  oxybutynin (DITROPAN) 5 MG tablet Take 5 mg by mouth at bedtime.   Yes Historical Provider, MD  pantoprazole (PROTONIX) 40 MG tablet Take 40 mg by mouth daily.     Yes Historical Provider, MD  PRAVASTATIN SODIUM PO Take 75 mg by mouth daily.   Yes Historical Provider, MD  prazosin (MINIPRESS) 2 MG capsule Take 2 capsules (4 mg total) by mouth at bedtime. 10/29/12  Yes Sherren Mocha, MD  Thiamine HCl (VITAMIN B-1) 250 MG tablet Take 250 mg by mouth daily.     Yes Historical Provider, MD  vitamin E 600 UNIT capsule Take 600  Units by mouth daily.     Yes Historical Provider, MD  silver sulfADIAZINE (SILVADENE) 1 % cream Apply 1 application topically 2 (two) times daily. 10/18/14   Ezekiel Slocumb, PA-C    Allergies  Allergen Reactions  . Neomycin-Bacitracin Zn-Polymyx     unknown  . Niacin     unknown  . Paroxetine     REACTION: goofy    Past Surgical History  Procedure Laterality Date  . Angiography      bilateral lower extremity angiography, stenting of the left.   . Popliteal artery angioplasty      didnt specify angioplasty or stent  . Spermatocelectomy      left  . Lower extremity angiogram N/A 11/04/2012    Procedure: LOWER EXTREMITY ANGIOGRAM;  Surgeon: Wellington Hampshire, MD;  Location: [facility] CATH LAB;  Service: Cardiovascular;  Laterality: N/A;  . Cardiac catheterization N/A 07/19/2014    Procedure: Left Heart Cath and Coronary Angiography;  Surgeon: Leonie Man, MD;  Location: [facility] INVASIVE CV LAB CUPID;  Service: Cardiovascular;  Laterality: N/A;    History  Substance Use Topics  . Smoking status: Former Research scientist (life sciences)  . Smokeless tobacco: Not on file     Comment: QUIT 2009  . Alcohol Use: No     Comment: denies alcohol use. there is a history of heavy alcohol use     Family History  Problem Relation Age of Onset  . CAD Father   . Congestive Heart Failure Mother     Medication list has been reviewed and updated.  Physical Examination:  Physical Exam  Constitutional: He is oriented to person, place, and time. He appears well-developed and well-nourished. No distress.  HENT:  Head: Normocephalic and atraumatic.  Right Ear: Hearing normal.  Left Ear: Hearing normal.  Nose: Nose normal.  Eyes: Conjunctivae and lids are normal. Right eye exhibits no discharge. Left eye exhibits no discharge. No scleral icterus.  Cardiovascular: Normal rate, regular rhythm, normal heart sounds, intact distal pulses and normal pulses.   No murmur heard. Left DP and PT pulses intact and 2+  Pulmonary/Chest:  Effort normal. No respiratory distress.  Musculoskeletal: Normal range of motion.  Neurological: He is alert and oriented to person, place, and time.  Skin: Skin is warm and dry.  Left medial ankle with thermal burn just superior to medial malleolus. 5 cm in diameter. Wound is erythematous and nonblanching. Thin layer of eschar over medial half of wound. No evidence of infection. Foot and ankle with nonpitting edema. Layer of silvadene placed on nonadherent dressing and wound dressed.  Psychiatric: He has a normal mood and affect. His speech is normal and behavior is normal. Thought content normal.   BP 112/70 mmHg  Pulse 66  Temp(Src) 98.3 F (36.8 C) (Oral)  Resp 18  Ht 6' 2.5" (1.892 m)  Wt 179 lb 3.2 oz (81.285 kg)  BMI 22.71 kg/m2  SpO2 98%  Assessment and Plan:  1. Thermal burn Deep partial thickness burn as skin is nonblanching. No evidence of infection. He will apply silvadene twice a day and cleanse wound before each new application. Clean only with soap and water. Silvadene will hopefully get eschar to start to slough off. Elevate foot to reduce swelling. Return in 3 days for follow with Philis Fendt, PA-C. - silver sulfADIAZINE (SILVADENE) 1 % cream; Apply 1 application topically 2 (two) times daily.  Dispense: 50 g; Refill: 0   Nicole V. Drenda Freeze, MHS [facility]  10/18/2014

## 2014-10-18 NOTE — Progress Notes (Signed)
Called and LMOM- do not see documentation of his most recent tetanus.  If it has been more than 8 years we should do a booster- can do at his recheck visit if he likes

## 2014-10-18 NOTE — Patient Instructions (Signed)
Apply silvadene cream twice a day. Before reapplying, cleanse the wound with soap and water. Hopefully yellow part of wound will start to slough off. Keep leg elevated. Keep wound covered. Return in 3 days for wound check with Philis Fendt.

## 2014-10-21 ENCOUNTER — Ambulatory Visit (INDEPENDENT_AMBULATORY_CARE_PROVIDER_SITE_OTHER): Payer: Medicare Other | Admitting: Physician Assistant

## 2014-10-21 VITALS — BP 150/80 | HR 73 | Temp 98.1°F | Resp 16 | Ht 74.5 in | Wt 176.0 lb

## 2014-10-21 DIAGNOSIS — T300 Burn of unspecified body region, unspecified degree: Secondary | ICD-10-CM | POA: Diagnosis not present

## 2014-10-21 NOTE — Progress Notes (Signed)
David David  MRN: [id number] DOB: [birth year]  Subjective:  Pt presents to clinic for a burn recheck.  He has been using the silvadene cream and he feels like the area looks much better.  He is having no problems with the area.  Patient Active Problem List   Diagnosis Date Noted  . Right upper quadrant abdominal pain 08/09/2014  . Chest pain 07/19/2014  . Essential hypertension 07/19/2014  . Hyperlipidemia 07/19/2014  . Hypothyroidism 07/19/2014  . Malnutrition of moderate degree 07/19/2014  . Unstable angina   . PAD (peripheral artery disease)   . TOBACCO ABUSE 09/27/2008  . CHOLESTEROL EMBOLIZATION SYNDROME 09/27/2008  . PERIPHERAL VASCULAR DISEASE 09/27/2008  . GERD 09/27/2008  . DYSFUNCTION, EUSTACHIAN TUBE 10/09/2006  . LOW HDL 07/28/2006  . DEPRESSION 07/28/2006  . PULMONARY NODULE 07/28/2006  . DIVERTICULOSIS, COLON 07/28/2006  . ERECTILE DYSFUNCTION, ORGANIC 07/28/2006  . SPRAIN/STRAIN, NECK 07/28/2006  . COLONIC POLYPS, HX OF 07/28/2006    Current Outpatient Prescriptions on File Prior to Visit  Medication Sig Dispense Refill  . amLODipine (NORVASC) 10 MG tablet Take 5 mg by mouth daily.      Marland Kitchen aspirin EC 81 MG tablet Take 1 tablet (81 mg total) by mouth daily. 90 tablet 3  . cholecalciferol (VITAMIN D) 1000 UNITS tablet Take 1,000 Units by mouth daily.    . clopidogrel (PLAVIX) 75 MG tablet Take 1 tablet (75 mg total) by mouth daily. (Patient taking differently: Take 75 mg by mouth at bedtime. ) 30 tablet 3  . cyclobenzaprine (FLEXERIL) 10 MG tablet Take 1 tablet (10 mg total) by mouth at bedtime. 30 tablet   . escitalopram (LEXAPRO) 10 MG tablet Take 1 tablet (10 mg total) by mouth 3 (three) times daily.    . finasteride (PROSCAR) 5 MG tablet Take 5 mg by mouth at bedtime.    . folic acid (FOLVITE) 1 MG tablet Take 1 mg by mouth daily.     Marland Kitchen gabapentin (NEURONTIN) 300 MG capsule Take 600 mg by mouth 2 (two) times daily.    . hydrOXYzine (ATARAX) 50 MG tablet  Take 50 mg by mouth 2 (two) times daily.     Marland Kitchen levothyroxine (SYNTHROID, LEVOTHROID) 75 MCG tablet Take 1 tablet (75 mcg total) by mouth daily before breakfast.    . methotrexate (RHEUMATREX) 2.5 MG tablet Take 12.5 mg by mouth once a week. Caution:Chemotherapy. Protect from light.Take on Friday    . mirtazapine (REMERON) 15 MG tablet Take 15 mg by mouth at bedtime.      . multivitamin (THERAGRAN) per tablet Take 1 tablet by mouth daily.      Marland Kitchen oxybutynin (DITROPAN) 5 MG tablet Take 5 mg by mouth at bedtime.    . pantoprazole (PROTONIX) 40 MG tablet Take 40 mg by mouth daily.      Marland Kitchen PRAVASTATIN SODIUM PO Take 75 mg by mouth daily.    . prazosin (MINIPRESS) 2 MG capsule Take 2 capsules (4 mg total) by mouth at bedtime.    . silver sulfADIAZINE (SILVADENE) 1 % cream Apply 1 application topically 2 (two) times daily. 50 g 0  . Thiamine HCl (VITAMIN B-1) 250 MG tablet Take 250 mg by mouth daily.      . vitamin E 600 UNIT capsule Take 600 Units by mouth daily.       No current facility-administered medications on file prior to visit.    Allergies  Allergen Reactions  . Neomycin-Bacitracin Zn-Polymyx     unknown  .  Niacin     unknown  . Paroxetine     REACTION: goofy    Review of Systems  Constitutional: Negative for fever and chills.   Objective:  BP 150/80 mmHg  Pulse 73  Temp(Src) 98.1 F (36.7 C) (Oral)  Resp 16  Ht 6' 2.5" (1.892 m)  Wt 176 lb (79.833 kg)  BMI 22.30 kg/m2  SpO2 99%  Physical Exam  Constitutional: He is oriented to person, place, and time and well-developed, well-nourished, and in no distress.  HENT:  Head: Normocephalic and atraumatic.  Right Ear: External ear normal.  Left Ear: External ear normal.  Eyes: Conjunctivae are normal.  Neck: Normal range of motion.  Pulmonary/Chest: Effort normal.  Neurological: He is alert and oriented to person, place, and time. Gait normal.  Skin: Skin is warm and dry.  8cm 2nd burn with eschar covering about 90% of  the burn.  The burn was debrided with pick-ups and curette - about 80% of the eschar was removed.  A silvadene drsg was placed. There was no surrounding erythema or warm.  The swelling of his foot was improved.  Psychiatric: Mood, memory, affect and judgment normal.    Assessment and Plan :  Thermal burn  Continue daily drsg changes.  He will make sure he washes the area daily with slight friction to continue to remove eschar and not allow more to develop.  He will f/u if he has problems.  Windell Hummingbird PA-C  [facility] 10/21/2014 4:47 PM

## 2014-10-21 NOTE — Progress Notes (Deleted)
David David  MRN: [id number] DOB: July 04, 1950  Subjective:  Pt presents to clinic  Patient Active Problem List   Diagnosis Date Noted  . Right upper quadrant abdominal pain 08/09/2014  . Chest pain 07/19/2014  . Essential hypertension 07/19/2014  . Hyperlipidemia 07/19/2014  . Hypothyroidism 07/19/2014  . Malnutrition of moderate degree 07/19/2014  . Unstable angina   . PAD (peripheral artery disease)   . TOBACCO ABUSE 09/27/2008  . CHOLESTEROL EMBOLIZATION SYNDROME 09/27/2008  . PERIPHERAL VASCULAR DISEASE 09/27/2008  . GERD 09/27/2008  . DYSFUNCTION, EUSTACHIAN TUBE 10/09/2006  . LOW HDL 07/28/2006  . DEPRESSION 07/28/2006  . PULMONARY NODULE 07/28/2006  . DIVERTICULOSIS, COLON 07/28/2006  . ERECTILE DYSFUNCTION, ORGANIC 07/28/2006  . SPRAIN/STRAIN, NECK 07/28/2006  . COLONIC POLYPS, HX OF 07/28/2006    Current Outpatient Prescriptions on File Prior to Visit  Medication Sig Dispense Refill  . amLODipine (NORVASC) 10 MG tablet Take 5 mg by mouth daily.      Marland Kitchen aspirin EC 81 MG tablet Take 1 tablet (81 mg total) by mouth daily. 90 tablet 3  . cholecalciferol (VITAMIN D) 1000 UNITS tablet Take 1,000 Units by mouth daily.    . clopidogrel (PLAVIX) 75 MG tablet Take 1 tablet (75 mg total) by mouth daily. (Patient taking differently: Take 75 mg by mouth at bedtime. ) 30 tablet 3  . cyclobenzaprine (FLEXERIL) 10 MG tablet Take 1 tablet (10 mg total) by mouth at bedtime. 30 tablet   . escitalopram (LEXAPRO) 10 MG tablet Take 1 tablet (10 mg total) by mouth 3 (three) times daily.    . finasteride (PROSCAR) 5 MG tablet Take 5 mg by mouth at bedtime.    . folic acid (FOLVITE) 1 MG tablet Take 1 mg by mouth daily.     Marland Kitchen gabapentin (NEURONTIN) 300 MG capsule Take 600 mg by mouth 2 (two) times daily.    . hydrOXYzine (ATARAX) 50 MG tablet Take 50 mg by mouth 2 (two) times daily.     Marland Kitchen levothyroxine (SYNTHROID, LEVOTHROID) 75 MCG tablet Take 1 tablet (75 mcg total) by mouth daily  before breakfast.    . methotrexate (RHEUMATREX) 2.5 MG tablet Take 12.5 mg by mouth once a week. Caution:Chemotherapy. Protect from light.Take on Friday    . mirtazapine (REMERON) 15 MG tablet Take 15 mg by mouth at bedtime.      . multivitamin (THERAGRAN) per tablet Take 1 tablet by mouth daily.      Marland Kitchen oxybutynin (DITROPAN) 5 MG tablet Take 5 mg by mouth at bedtime.    . pantoprazole (PROTONIX) 40 MG tablet Take 40 mg by mouth daily.      Marland Kitchen PRAVASTATIN SODIUM PO Take 75 mg by mouth daily.    . prazosin (MINIPRESS) 2 MG capsule Take 2 capsules (4 mg total) by mouth at bedtime.    . silver sulfADIAZINE (SILVADENE) 1 % cream Apply 1 application topically 2 (two) times daily. 50 g 0  . Thiamine HCl (VITAMIN B-1) 250 MG tablet Take 250 mg by mouth daily.      . vitamin E 600 UNIT capsule Take 600 Units by mouth daily.       No current facility-administered medications on file prior to visit.    Allergies  Allergen Reactions  . Neomycin-Bacitracin Zn-Polymyx     unknown  . Niacin     unknown  . Paroxetine     REACTION: goofy    Review of Systems Objective:  BP 150/80 mmHg  Pulse  73  Temp(Src) 98.1 F (36.7 C) (Oral)  Resp 16  Ht 6' 2.5" (1.892 m)  Wt 176 lb (79.833 kg)  BMI 22.30 kg/m2  SpO2 99%  Physical Exam  Assessment and Plan :  No diagnosis found.  Windell Hummingbird PA-C  [facility] 10/21/2014 4:36 PM

## 2014-11-18 DIAGNOSIS — H35341 Macular cyst, hole, or pseudohole, right eye: Secondary | ICD-10-CM | POA: Diagnosis not present

## 2014-11-22 DIAGNOSIS — H35341 Macular cyst, hole, or pseudohole, right eye: Secondary | ICD-10-CM | POA: Diagnosis not present

## 2014-11-23 DIAGNOSIS — H35341 Macular cyst, hole, or pseudohole, right eye: Secondary | ICD-10-CM | POA: Diagnosis not present

## 2014-11-23 DIAGNOSIS — H547 Unspecified visual loss: Secondary | ICD-10-CM | POA: Diagnosis not present

## 2014-12-05 ENCOUNTER — Other Ambulatory Visit: Payer: Self-pay | Admitting: Cardiovascular Disease

## 2014-12-05 DIAGNOSIS — I739 Peripheral vascular disease, unspecified: Secondary | ICD-10-CM

## 2014-12-13 ENCOUNTER — Inpatient Hospital Stay (HOSPITAL_COMMUNITY): Admission: RE | Admit: 2014-12-13 | Payer: Medicare Other | Source: Ambulatory Visit

## 2014-12-22 DIAGNOSIS — H35341 Macular cyst, hole, or pseudohole, right eye: Secondary | ICD-10-CM | POA: Diagnosis not present

## 2015-02-14 ENCOUNTER — Ambulatory Visit (HOSPITAL_COMMUNITY)
Admission: RE | Admit: 2015-02-14 | Discharge: 2015-02-14 | Disposition: A | Discharge status: Home / Self Care | Payer: Medicare Other | Source: Ambulatory Visit | Attending: Cardiovascular Disease | Admitting: Cardiovascular Disease

## 2015-02-14 DIAGNOSIS — F17200 Nicotine dependence, unspecified, uncomplicated: Secondary | ICD-10-CM | POA: Insufficient documentation

## 2015-02-14 DIAGNOSIS — I10 Essential (primary) hypertension: Secondary | ICD-10-CM | POA: Insufficient documentation

## 2015-02-14 DIAGNOSIS — I779 Disorder of arteries and arterioles, unspecified: Secondary | ICD-10-CM | POA: Insufficient documentation

## 2015-02-14 DIAGNOSIS — I739 Peripheral vascular disease, unspecified: Secondary | ICD-10-CM | POA: Diagnosis not present

## 2015-02-21 ENCOUNTER — Encounter: Payer: Self-pay | Admitting: Cardiovascular Disease

## 2015-02-21 ENCOUNTER — Ambulatory Visit (INDEPENDENT_AMBULATORY_CARE_PROVIDER_SITE_OTHER): Payer: Medicare Other | Admitting: Cardiovascular Disease

## 2015-02-21 VITALS — BP 144/78 | HR 80 | Ht 74.5 in | Wt 184.0 lb

## 2015-02-21 DIAGNOSIS — I10 Essential (primary) hypertension: Secondary | ICD-10-CM | POA: Diagnosis not present

## 2015-02-21 DIAGNOSIS — I739 Peripheral vascular disease, unspecified: Secondary | ICD-10-CM

## 2015-02-21 DIAGNOSIS — R413 Other amnesia: Secondary | ICD-10-CM

## 2015-02-21 DIAGNOSIS — E785 Hyperlipidemia, unspecified: Secondary | ICD-10-CM

## 2015-02-21 NOTE — Assessment & Plan Note (Signed)
The patient reports family history of early dementia. He has been having difficulty with memory including remembering recent events and also losing concentration during conversations.  I referred him to neurology for evaluation.

## 2015-02-21 NOTE — Assessment & Plan Note (Signed)
He is currently on pravastatin. He usually follows at the [place] but he has been having difficulty going there. He requested referral to a local primary care physician. I referred him to [facility] primary care.

## 2015-02-21 NOTE — Patient Instructions (Signed)
Medication Instructions:  Your physician recommends that you continue on your current medications as directed. Please refer to the Current Medication list given to you today.   Labwork: none  Testing/Procedures: You have been referred to [facility] primary care.  You have been referred to [facility] neurology.  Your physician has requested that you have a lower   extremity arterial duplex. This test is an ultrasound of the arteries in the legs  . It looks at arterial blood flow in the legs . Allow one hour for Lower   Arterial scans. There are no restrictions or special instructions.  To be done in 12 months.   Follow-Up:  Your physician wants you to follow-up in: 12 months.  You will receive a reminder letter in the mail two months in advance. If you don't receive a letter, please call our office to schedule the follow-up appointment.   Any Other Special Instructions Will Be Listed Below (If Applicable).     If you need a refill on your cardiac medications before your next appointment, please call your pharmacy.

## 2015-02-21 NOTE — Progress Notes (Signed)
HPI:  64 year old male who is here today for a follow-up visit regarding peripheral arterial disease. The patient has lower extremity peripheral arterial disease with recurrent atheroembolic events to the left foot due to distal left popliteal artery stenosis with previous Cypher drug-eluting stent placement which was used in order to treat an ulcerated plaque with distal embolization.  He was  seen in 11/2012  for a painless rash involving the left great toe and medial foot, with typical appearance of an athero embolic event. Echocardiogram was unremarkable.  CTA of the chest abdomen and pelvis with runoff to the feet demonstrated scattered atheromatous the aorta but no significant findings.  The left tibioperoneal stent had greater than 70% stenosis. ABI was normal but there was significant dampening in pressure noted in the big toe. I proceeded with angiography which confirmed this and also showed an occluded dorsalis pedis likely from embolization. I performed balloon angioplasty with an Angioscore balloon to both ostial anterior tibial and ostial tibioperoneal trunk.  He had cardiac catheterization in May of this year which showed moderate left circumflex stenosis and no evidence of obstructive disease.  He has been doing well overall with no claudication, ulceration or discoloration. No chest pain. He did have a superficial burn on the medial aspect of the left ankle in August which almost healed completely. His biggest issue is progressive memory loss. He reports family history of early dementia.  Outpatient Encounter Prescriptions as of 02/21/2015  Medication Sig  . amLODipine (NORVASC) 10 MG tablet Take 5 mg by mouth daily.    Marland Kitchen aspirin EC 81 MG tablet Take 1 tablet (81 mg total) by mouth daily.  . cholecalciferol (VITAMIN D) 1000 UNITS tablet Take 1,000 Units by mouth daily.  . clopidogrel (PLAVIX) 75 MG tablet Take 1 tablet (75 mg total) by mouth daily. (Patient taking differently: Take  75 mg by mouth at bedtime. )  . cyclobenzaprine (FLEXERIL) 10 MG tablet Take 1 tablet (10 mg total) by mouth at bedtime.  Marland Kitchen escitalopram (LEXAPRO) 10 MG tablet Take 1 tablet (10 mg total) by mouth 3 (three) times daily.  . finasteride (PROSCAR) 5 MG tablet Take 5 mg by mouth at bedtime.  . folic acid (FOLVITE) 1 MG tablet Take 1 mg by mouth daily.   Marland Kitchen gabapentin (NEURONTIN) 300 MG capsule Take 600 mg by mouth 2 (two) times daily.  . hydrOXYzine (ATARAX) 50 MG tablet Take 50 mg by mouth 2 (two) times daily.   Marland Kitchen levothyroxine (SYNTHROID, LEVOTHROID) 75 MCG tablet Take 1 tablet (75 mcg total) by mouth daily before breakfast.  . methotrexate (RHEUMATREX) 2.5 MG tablet Take 12.5 mg by mouth once a week. Caution:Chemotherapy. Protect from light.Take on Friday  . mirtazapine (REMERON) 15 MG tablet Take 15 mg by mouth at bedtime.    . multivitamin (THERAGRAN) per tablet Take 1 tablet by mouth daily.    Marland Kitchen oxybutynin (DITROPAN) 5 MG tablet Take 5 mg by mouth at bedtime.  . pantoprazole (PROTONIX) 40 MG tablet Take 40 mg by mouth daily.    Marland Kitchen PRAVASTATIN SODIUM PO Take 75 mg by mouth daily.  . prazosin (MINIPRESS) 2 MG capsule Take 2 capsules (4 mg total) by mouth at bedtime.  . silver sulfADIAZINE (SILVADENE) 1 % cream Apply 1 application topically 2 (two) times daily.  . Thiamine HCl (VITAMIN B-1) 250 MG tablet Take 250 mg by mouth daily.    . vitamin E 600 UNIT capsule Take 600 Units by mouth daily.  No facility-administered encounter medications on file as of 02/21/2015.    Allergies  Allergen Reactions  . Neomycin-Bacitracin Zn-Polymyx     unknown  . Niacin     unknown  . Paroxetine     REACTION: goofy    Past Medical History  Diagnosis Date  . Peripheral vascular disease, unspecified ([place])     lower extremity peripheral artery disease  . Unspecified hypertensive kidney disease with chronic kidney disease stage I through stage IV, or unspecified     cholesterol embolization syndrome    . GERD (gastroesophageal reflux disease)   . Exposure to STD   . Dysfunction of eustachian tube   . Routine general medical examination at a health care facility   . Family history of psychiatric condition     family Hx of suicide   . Alcoholism in family   . Family history of diabetes mellitus   . Lipoprotein deficiencies   . Impotence of organic origin     erectile dysfunction  . Other diseases of lung, not elsewhere classified     pulmonary nodule  . Sprain of neck     Hx  . Diverticulosis of colon (without mention of hemorrhage)   . Depressive disorder, not elsewhere classified   . Personal history of colonic polyps   . Tobacco use disorder   . Anxiety     ROS: Negative except as per HPI  BP 144/78 mmHg  Pulse 80  Ht 6' 2.5" (1.892 m)  Wt 184 lb (83.462 kg)  BMI 23.32 kg/m2  PHYSICAL EXAM: Pt is alert and oriented, NAD HEENT: normal Neck: JVP - normal, carotids 2+= without bruits Lungs: CTA bilaterally CV: RRR without murmur or gallop Abd: soft, NT, Positive BS, no hepatomegaly Ext: no C/C/E. dorsalis pedis is  normal palpable bilaterally. Posterior tibial is palpable on both sides.  ------------------------------------------------------------    Assessment and plan:

## 2015-02-21 NOTE — Assessment & Plan Note (Signed)
Blood pressure is reasonably controlled on current medications. 

## 2015-02-21 NOTE — Assessment & Plan Note (Signed)
The patient has no recurrent symptoms. Recent noninvasive evaluation showed normal ABI bilaterally. There was moderate in-stent restenosis in the left popliteal artery. Continue to monitor for now. Continue lifelong dual antiplatelet therapy.

## 2015-02-22 DIAGNOSIS — H35341 Macular cyst, hole, or pseudohole, right eye: Secondary | ICD-10-CM | POA: Diagnosis not present

## 2015-02-24 ENCOUNTER — Encounter: Payer: Self-pay | Admitting: Internal Medicine

## 2015-03-30 ENCOUNTER — Encounter: Payer: Self-pay | Admitting: Neurology

## 2015-03-30 ENCOUNTER — Other Ambulatory Visit (INDEPENDENT_AMBULATORY_CARE_PROVIDER_SITE_OTHER): Payer: Medicare Other

## 2015-03-30 ENCOUNTER — Ambulatory Visit (INDEPENDENT_AMBULATORY_CARE_PROVIDER_SITE_OTHER): Payer: Medicare Other | Admitting: Neurology

## 2015-03-30 ENCOUNTER — Telehealth: Payer: Self-pay | Admitting: Family Medicine

## 2015-03-30 VITALS — BP 138/74 | HR 71 | Ht 75.0 in | Wt 180.0 lb

## 2015-03-30 DIAGNOSIS — Z818 Family history of other mental and behavioral disorders: Secondary | ICD-10-CM

## 2015-03-30 DIAGNOSIS — R413 Other amnesia: Secondary | ICD-10-CM

## 2015-03-30 DIAGNOSIS — Z820 Family history of epilepsy and other diseases of the nervous system: Secondary | ICD-10-CM | POA: Diagnosis not present

## 2015-03-30 DIAGNOSIS — G629 Polyneuropathy, unspecified: Secondary | ICD-10-CM | POA: Diagnosis not present

## 2015-03-30 LAB — VITAMIN B12: VITAMIN B 12: 697 pg/mL (ref 211–911)

## 2015-03-30 NOTE — Progress Notes (Addendum)
NEUROLOGY CONSULTATION NOTE  David David MRN: [id number] DOB: 09-Jan-1951  Referring provider: Dr. Kathlyn Sacramento Primary care provider: none  Reason for consult:  Memory loss  Dear Dr Fletcher Anon:  Thank you for your kind referral of David David for consultation of the above symptoms. Although David history is well known to you, please allow me to reiterate it for the purpose of our medical record. The patient was accompanied to the clinic by David David who also provides collateral information. Records and images were personally reviewed where available.  HISTORY OF PRESENT ILLNESS: This is a pleasant 65 year old right-handed man with a history of peripheral vascular disease, hypertension, hyperlipidemia, hypothyroidism, PTSD, presenting for evaluation of worsening memory loss. He has a strong family history of dementia in 3 siblings, who were diagnosed at age 67 or so. He and David David started noticing changes a little over a year ago, he would start a conversation and forget what the conversation was about. He would start to say something and forget what he was going to say, getting halfway through a sentence then the rest would not come out. He feels like David head and mouth are not working together. David David has noticed this as well, he gets blank when trying to process, with long pauses in conversations that have become more frequent. He denies getting lost driving, but has gotten confused a couple of times. One time he could not find David cousin's house because he forgot what road she lived on. He was eventually able to remember it after speaking to David David. He lives with David David and denies any missed medications or bill payments (he reports there are no bills to pay). David David reports he is pretty well-organized, he denies misplacing things frequently. She reports he gets a little more frustrated easily, but no paranoia any different from David history of PTSD.   He has  blurred vision from a right cataract, occasional neck and back pain, on and off constipation. He has numbness in David feet, no pain. He denies any headaches, dizziness, diplopia, dysarthria, dysphagia, anosmia, or tremors. No significant head injuries. He reports having a seizure in 2005 when he was on a significant amount of Wellbutrin for PTSD. He has PTSD as a [place] veteran. He drinks a glass of wine or beer 1-2 times a week. David older sister was diagnosed with dementia at age 30 or 37 and passed away 2 years ago. David brother now 33 has dementia. David sister is 67 and has dementia. He "does not want to get the way they are if he can get help now."   Laboratory Data: He reports having TSH done at the [place] in October with normal TSH.  Lab Results  Component Value Date   WBC 7.3 07/19/2014   HGB 12.2* 07/19/2014   HCT 37.8* 07/19/2014   MCV 91.5 07/19/2014   PLT 223 07/19/2014     Chemistry      Component Value Date/Time   NA 140 07/20/2014 0436   K 3.9 07/20/2014 0436   CL 107 07/20/2014 0436   CO2 25 07/20/2014 0436   BUN 11 07/20/2014 0436   CREATININE 0.93 07/20/2014 0436      Component Value Date/Time   CALCIUM 8.7* 07/20/2014 0436   ALKPHOS 111 01/30/2014 2055   AST 28 01/30/2014 2055   ALT 16 01/30/2014 2055   BILITOT 0.3 01/30/2014 2055      PAST MEDICAL HISTORY: Past Medical History  Diagnosis Date  . Peripheral vascular disease, unspecified ([place])     lower extremity peripheral artery disease  . Unspecified hypertensive kidney disease with chronic kidney disease stage I through stage IV, or unspecified     cholesterol embolization syndrome  . GERD (gastroesophageal reflux disease)   . Exposure to STD   . Dysfunction of eustachian tube   . Routine general medical examination at a health care facility   . Family history of psychiatric condition     family Hx of suicide   . Alcoholism in family   . Family history of diabetes mellitus   . Lipoprotein deficiencies     . Impotence of organic origin     erectile dysfunction  . Other diseases of lung, not elsewhere classified     pulmonary nodule  . Sprain of neck     Hx  . Diverticulosis of colon (without mention of hemorrhage)   . Depressive disorder, not elsewhere classified   . Personal history of colonic polyps   . Tobacco use disorder   . Anxiety   . Thyroid disease     PAST SURGICAL HISTORY: Past Surgical History  Procedure Laterality Date  . Angiography      bilateral lower extremity angiography, stenting of the left.   . Popliteal artery angioplasty      didnt specify angioplasty or stent  . Spermatocelectomy      left  . Lower extremity angiogram N/A 11/04/2012    Procedure: LOWER EXTREMITY ANGIOGRAM;  Surgeon: Wellington Hampshire, MD;  Location: [place] CATH LAB;  Service: Cardiovascular;  Laterality: N/A;  . Cardiac catheterization N/A 07/19/2014    Procedure: Left Heart Cath and Coronary Angiography;  Surgeon: Leonie Man, MD;  Location: [place] INVASIVE CV LAB CUPID;  Service: Cardiovascular;  Laterality: N/A;  . Eye surgery Right     MEDICATIONS: Current Outpatient Prescriptions on File Prior to Visit  Medication Sig Dispense Refill  . amLODipine (NORVASC) 10 MG tablet Take 5 mg by mouth daily.      Marland Kitchen aspirin EC 81 MG tablet Take 1 tablet (81 mg total) by mouth daily. 90 tablet 3  . cholecalciferol (VITAMIN D) 1000 UNITS tablet Take 1,000 Units by mouth daily.    . clopidogrel (PLAVIX) 75 MG tablet Take 1 tablet (75 mg total) by mouth daily. (Patient taking differently: Take 75 mg by mouth at bedtime. ) 30 tablet 3  . cyclobenzaprine (FLEXERIL) 10 MG tablet Take 1 tablet (10 mg total) by mouth at bedtime. 30 tablet   . escitalopram (LEXAPRO) 10 MG tablet Take 1 tablet (10 mg total) by mouth 3 (three) times daily.    . finasteride (PROSCAR) 5 MG tablet Take 5 mg by mouth at bedtime.    . folic acid (FOLVITE) 1 MG tablet Take 1 mg by mouth daily.     Marland Kitchen gabapentin (NEURONTIN) 300 MG capsule  Take 600 mg by mouth 2 (two) times daily.    . hydrOXYzine (ATARAX) 50 MG tablet Take 50 mg by mouth 2 (two) times daily.     Marland Kitchen levothyroxine (SYNTHROID, LEVOTHROID) 75 MCG tablet Take 1 tablet (75 mcg total) by mouth daily before breakfast.    . methotrexate (RHEUMATREX) 2.5 MG tablet Take 12.5 mg by mouth once a week. Caution:Chemotherapy. Protect from light.Take on Friday    . mirtazapine (REMERON) 15 MG tablet Take 15 mg by mouth at bedtime.      . multivitamin (THERAGRAN) per tablet Take 1 tablet by mouth daily.      Marland Kitchen  oxybutynin (DITROPAN) 5 MG tablet Take 5 mg by mouth at bedtime.    . pantoprazole (PROTONIX) 40 MG tablet Take 40 mg by mouth daily.      Marland Kitchen PRAVASTATIN SODIUM PO Take 75 mg by mouth daily.    . prazosin (MINIPRESS) 2 MG capsule Take 2 capsules (4 mg total) by mouth at bedtime.    . Thiamine HCl (VITAMIN B-1) 250 MG tablet Take 250 mg by mouth daily.      . vitamin E 600 UNIT capsule Take 600 Units by mouth daily.       No current facility-administered medications on file prior to visit.    ALLERGIES: Allergies  Allergen Reactions  . Neomycin-Bacitracin Zn-Polymyx     unknown  . Niacin     unknown  . Paroxetine     REACTION: goofy    FAMILY HISTORY: Family History  Problem Relation Age of Onset  . CAD Father   . Congestive Heart Failure Mother   . Cancer Brother   . Alzheimer's disease Sister   . Alzheimer's disease Brother   . Diabetes Brother     SOCIAL HISTORY: Social History   Social History  . Marital Status: Single    Spouse Name: N/A  . Number of Children: 1  . Years of Education: N/A   Occupational History  . Doesn't work    Social History Main Topics  . Smoking status: Former Smoker    Types: Cigarettes  . Smokeless tobacco: Never Used     Comment: QUIT 2009  . Alcohol Use: 0.0 oz/week    0 Standard drinks or equivalent per week     Comment:    . Drug Use: No  . Sexual Activity: Not on file   Other Topics Concern  . Not on  file   Social History Narrative   No diet, not restricting cholesterol.    REVIEW OF SYSTEMS: Constitutional: No fevers, chills, or sweats, no generalized fatigue, change in appetite Eyes: No visual changes, double vision, eye pain Ear, nose and throat: No hearing loss, ear pain, nasal congestion, sore throat Cardiovascular: No chest pain, palpitations Respiratory:  No shortness of breath at rest or with exertion, wheezes GastrointestinaI: No nausea, vomiting, diarrhea, abdominal pain, fecal incontinence Genitourinary:  No dysuria, urinary retention or frequency Musculoskeletal:  + neck pain, back pain Integumentary: No rash, pruritus, skin lesions Neurological: as above Psychiatric: No depression, insomnia, anxiety Endocrine: No palpitations, fatigue, diaphoresis, mood swings, change in appetite, +change in weight,no increased thirst Hematologic/Lymphatic:  No anemia, purpura, petechiae. Allergic/Immunologic: no itchy/runny eyes, nasal congestion, recent allergic reactions, rashes  PHYSICAL EXAM: Filed Vitals:   03/30/15 0851  BP: 138/74  Pulse: 71   General: No acute distress Head:  Normocephalic/atraumatic Eyes: Fundoscopic exam shows bilateral sharp discs, no vessel changes, exudates, or hemorrhages Neck: supple, no paraspinal tenderness, full range of motion Back: No paraspinal tenderness Heart: regular rate and rhythm Lungs: Clear to auscultation bilaterally. Vascular: No carotid bruits. Skin/Extremities: No rash, no edema Neurological Exam: Mental status: alert and oriented to person, place, and time, no dysarthria or aphasia, Fund of knowledge is appropriate.  Recent and remote memory are intact.  Attention and concentration are normal.    Able to name objects and repeat phrases.  Montreal Cognitive Assessment  03/30/2015  Visuospatial/ Executive (0/5) 5  Naming (0/3) 3  Attention: Read list of digits (0/2) 2  Attention: Read list of letters (0/1) 1  Attention:  Serial 7 subtraction starting at 100 (  0/3) 3  Language: Repeat phrase (0/2) 2  Language : Fluency (0/1) 1  Abstraction (0/2) 2  Delayed Recall (0/5) 5  Orientation (0/6) 6  Total 30  Adjusted Score (based on education) 30   Cranial nerves: CN I: not tested CN II: pupils equal, round and reactive to light, visual fields intact, fundi unremarkable. CN III, IV, VI:  full range of motion, no nystagmus, no ptosis CN V: facial sensation intact CN VII: upper and lower face symmetric CN VIII: hearing intact to finger rub CN IX, X: gag intact, uvula midline CN XI: sternocleidomastoid and trapezius muscles intact CN XII: tongue midline Bulk & Tone: normal, no cogwheeling, no fasciculations. Motor: 5/5 throughout with no pronator drift. Sensation: intact to all modalities on both UE, decreased pin and cold in stocking distribution to ankles bilaterally, decreased vibration to right ankle and left knee, intact joint position sense.  No extinction to double simultaneous stimulation.  Romberg test negative Deep Tendon Reflexes: +2 on both UE, patella, left ankle, +1 right ankle jerk. No ankle clonus Plantar responses: downgoing bilaterally Cerebellar: no incoordination on finger to nose, heel to shin. No dysdiadochokinesia Gait: narrow-based and steady, able to tandem walk adequately. Tremor: none  IMPRESSION: This is a pleasant 65 year old right-handed man with vascular risk factors including hypertension, hyperlipidemia, peripheral vascular disease, strong family history of dementia, presenting with worsening memory loss, particularly with word-finding difficulties. David neurological exam shows evidence of a length-dependent neuropathy, otherwise non-focal. MOCA score normal 30/30. We discussed different causes of memory loss. He reports recent TSH was normal. Check B12. MRI brain without contrast will be ordered to assess for underlying structural abnormality and assess vascular load. We discussed  that at this point there is no clear indication to start cholinesterase inhibitors such as Aricept, would recommend a follow-up in 8 or so months and if any changes in exam, we can consider starting medication at that time. We discussed the importance of control of vascular risk factors, physical exercise, and brain stimulation exercises for brain health. He will follow-up in 8 months.   Thank you for allowing me to participate in the care of this patient. Please do not hesitate to call for any questions or concerns.   Ellouise Newer, M.D.  CC: Dr. Fletcher Anon

## 2015-03-30 NOTE — Telephone Encounter (Signed)
-----   Message from Cameron Sprang, MD sent at 03/30/2015  1:21 PM EST ----- pls let him know B12 level is normal, thanks

## 2015-03-30 NOTE — Patient Instructions (Signed)
1. Schedule MRI brain without contrast 2. Bloodwork for B12 level 3. Control of BP, cholesterol, as well as physical exercise and brain stimulation exercises are important for brain health 4. Follow-up in 8 months

## 2015-03-30 NOTE — Telephone Encounter (Signed)
Lmovm to rtn my call. 

## 2015-04-03 NOTE — Telephone Encounter (Signed)
Patient informed B12 was normal.

## 2015-04-03 NOTE — Telephone Encounter (Signed)
Lmovm to rtn my call. 

## 2015-04-13 ENCOUNTER — Ambulatory Visit (HOSPITAL_COMMUNITY)
Admission: RE | Admit: 2015-04-13 | Discharge: 2015-04-13 | Disposition: A | Discharge status: Home / Self Care | Payer: Medicare Other | Source: Ambulatory Visit | Attending: Neurology | Admitting: Neurology

## 2015-04-13 DIAGNOSIS — R413 Other amnesia: Secondary | ICD-10-CM | POA: Insufficient documentation

## 2015-04-13 DIAGNOSIS — I998 Other disorder of circulatory system: Secondary | ICD-10-CM | POA: Diagnosis not present

## 2015-04-13 DIAGNOSIS — R410 Disorientation, unspecified: Secondary | ICD-10-CM | POA: Diagnosis not present

## 2015-04-13 DIAGNOSIS — Z820 Family history of epilepsy and other diseases of the nervous system: Secondary | ICD-10-CM | POA: Diagnosis not present

## 2015-04-14 ENCOUNTER — Telehealth: Payer: Self-pay | Admitting: Family Medicine

## 2015-04-14 NOTE — Telephone Encounter (Signed)
-----   Message from Cameron Sprang, MD sent at 04/14/2015  8:41 AM EST ----- Pls let him know MRI brain is unremarkable, no evidence of tumor, stroke, or bleed. Thanks

## 2015-04-14 NOTE — Telephone Encounter (Signed)
Patient was notified of results.  

## 2015-05-09 LAB — CBC AND DIFFERENTIAL
HCT: 42 (ref 41–53)
HEMOGLOBIN: 13.7 (ref 13.5–17.5)
Platelets: 171 (ref 150–399)
WBC: 9.1

## 2015-05-15 ENCOUNTER — Ambulatory Visit (INDEPENDENT_AMBULATORY_CARE_PROVIDER_SITE_OTHER): Payer: Medicare Other | Admitting: Family Medicine

## 2015-05-15 VITALS — BP 132/74 | HR 81 | Temp 98.3°F | Resp 16 | Ht 74.0 in | Wt 177.0 lb

## 2015-05-15 DIAGNOSIS — R599 Enlarged lymph nodes, unspecified: Secondary | ICD-10-CM

## 2015-05-15 DIAGNOSIS — R590 Localized enlarged lymph nodes: Secondary | ICD-10-CM

## 2015-05-15 DIAGNOSIS — R070 Pain in throat: Secondary | ICD-10-CM | POA: Diagnosis not present

## 2015-05-15 DIAGNOSIS — R634 Abnormal weight loss: Secondary | ICD-10-CM | POA: Diagnosis not present

## 2015-05-15 LAB — POCT CBC
Granulocyte percent: 71.8 %G (ref 37–80)
HEMATOCRIT: 40.7 % — AB (ref 43.5–53.7)
Hemoglobin: 13.7 g/dL — AB (ref 14.1–18.1)
LYMPH, POC: 2.1 (ref 0.6–3.4)
MCH: 30.4 pg (ref 27–31.2)
MCHC: 33.6 g/dL (ref 31.8–35.4)
MCV: 90.5 fL (ref 80–97)
MID (cbc): 0.4 (ref 0–0.9)
MPV: 9 fL (ref 0–99.8)
POC GRANULOCYTE: 6.4 (ref 2–6.9)
POC LYMPH PERCENT: 23.8 %L (ref 10–50)
POC MID %: 4.4 % (ref 0–12)
Platelet Count, POC: 112 10*3/uL — AB (ref 142–424)
RBC: 4.49 M/uL — AB (ref 4.69–6.13)
RDW, POC: 15.2 %
WBC: 8.9 10*3/uL (ref 4.6–10.2)

## 2015-05-15 LAB — COMPLETE METABOLIC PANEL WITH GFR
ALT: 21 U/L (ref 9–46)
AST: 25 U/L (ref 10–35)
Albumin: 4.1 g/dL (ref 3.6–5.1)
Alkaline Phosphatase: 108 U/L (ref 40–115)
BUN: 13 mg/dL (ref 7–25)
CHLORIDE: 108 mmol/L (ref 98–110)
CO2: 25 mmol/L (ref 20–31)
CREATININE: 1.02 mg/dL (ref 0.70–1.25)
Calcium: 9.5 mg/dL (ref 8.6–10.3)
GFR, Est African American: 88 mL/min (ref >=60)
GFR, Est Non African American: 76 mL/min (ref >=60)
GLUCOSE: 96 mg/dL (ref 65–99)
Potassium: 4.3 mmol/L (ref 3.5–5.3)
SODIUM: 141 mmol/L (ref 135–146)
Total Bilirubin: 0.4 mg/dL (ref 0.2–1.2)
Total Protein: 6.9 g/dL (ref 6.1–8.1)

## 2015-05-15 LAB — POCT RAPID STREP A (OFFICE): RAPID STREP A SCREEN: NEGATIVE

## 2015-05-15 LAB — TSH: TSH: 1.6 mIU/L (ref 0.40–4.50)

## 2015-05-15 MED ORDER — AMOXICILLIN-POT CLAVULANATE 875-125 MG PO TABS: 1.0000 | ORAL_TABLET | Freq: Two times a day (BID) | ORAL | Status: AC

## 2015-05-15 NOTE — Progress Notes (Signed)
[facility] [address] [place] [address] [phone]  Date:  05/15/2015   Name:  David David   DOB:  Jun 23, 1950   MRN:  [id number]  PCP:  [facility]   Chief Complaint  Patient presents with  . Other    lymph node pain right side of neck      History of Present Illness:  David David is a 65 y.o. male patient who presents to [facility] for swollen lymph nodes   Left lymph node swollen for 3 days possibly associated with sore throat.  Hurts to swallow.  The pain has improved today, but is still present.  He has had no fever.  No recent URI symptoms.  He reports a 7lb weight loss within the last 2 months.  He reports that he has very low appetite, though unsure of the cause.  No abdominal pain or nausea.  He has discussed this with pcp at [facility] today.  He reports there is a recommendation that he have a body scan today, though unsure of what the cause.  He reports that this was discussed after having right shoulder pain for months.  No radiating pain.  No trauma.  Posterior right shoulder.  Xray reaped no findings, but physician was concerned with this and is referring him to an oncologist.  No blood work was performed, he states.  Asked to confirm this was not an orthopedist, and he states no.      He is taking thyroid medicaiton daily.  This is followed by endocrinologist at the [facility].     Patient Active Problem List   Diagnosis Date Noted  . Family history of first degree relative with dementia 03/30/2015  . Neuropathy ([place]) 03/30/2015  . Memory loss 02/21/2015  . Right upper quadrant abdominal pain 08/09/2014  . Chest pain 07/19/2014  . Essential hypertension 07/19/2014  . Hyperlipidemia 07/19/2014  . Hypothyroidism 07/19/2014  . Malnutrition of moderate degree ([place]) 07/19/2014  . Unstable angina ([place])   . PAD (peripheral artery disease) ([place])   . TOBACCO ABUSE 09/27/2008  . CHOLESTEROL EMBOLIZATION SYNDROME 09/27/2008  .  PERIPHERAL VASCULAR DISEASE 09/27/2008  . GERD 09/27/2008  . DYSFUNCTION, EUSTACHIAN TUBE 10/09/2006  . LOW HDL 07/28/2006  . DEPRESSION 07/28/2006  . PULMONARY NODULE 07/28/2006  . DIVERTICULOSIS, COLON 07/28/2006  . ERECTILE DYSFUNCTION, ORGANIC 07/28/2006  . SPRAIN/STRAIN, NECK 07/28/2006  . COLONIC POLYPS, HX OF 07/28/2006    Past Medical History  Diagnosis Date  . Peripheral vascular disease, unspecified ([place])     lower extremity peripheral artery disease  . Unspecified hypertensive kidney disease with chronic kidney disease stage I through stage IV, or unspecified     cholesterol embolization syndrome  . GERD (gastroesophageal reflux disease)   . Exposure to STD   . Dysfunction of eustachian tube   . Routine general medical examination at a health care facility   . Family history of psychiatric condition     family Hx of suicide   . Alcoholism in family   . Family history of diabetes mellitus   . Lipoprotein deficiencies   . Impotence of organic origin     erectile dysfunction  . Other diseases of lung, not elsewhere classified     pulmonary nodule  . Sprain of neck     Hx  . Diverticulosis of colon (without mention of hemorrhage)   . Depressive disorder, not elsewhere classified   . Personal history of colonic polyps   .  Tobacco use disorder   . Anxiety   . Thyroid disease     Past Surgical History  Procedure Laterality Date  . Angiography      bilateral lower extremity angiography, stenting of the left.   . Popliteal artery angioplasty      didnt specify angioplasty or stent  . Spermatocelectomy      left  . Lower extremity angiogram N/A 11/04/2012    Procedure: LOWER EXTREMITY ANGIOGRAM;  Surgeon: Wellington Hampshire, MD;  Location: [facility] CATH LAB;  Service: Cardiovascular;  Laterality: N/A;  . Cardiac catheterization N/A 07/19/2014    Procedure: Left Heart Cath and Coronary Angiography;  Surgeon: Leonie Man, MD;  Location: [facility] INVASIVE CV LAB CUPID;  Service:  Cardiovascular;  Laterality: N/A;  . Eye surgery Right     Social History  Substance Use Topics  . Smoking status: Former Smoker    Types: Cigarettes  . Smokeless tobacco: Never Used     Comment: QUIT 2009  . Alcohol Use: 0.0 oz/week    0 Standard drinks or equivalent per week     Comment:      Family History  Problem Relation Age of Onset  . CAD Father   . Congestive Heart Failure Mother   . Cancer Brother   . Alzheimer's disease Sister   . Alzheimer's disease Brother   . Diabetes Brother     Allergies  Allergen Reactions  . Neomycin-Bacitracin Zn-Polymyx     unknown  . Niacin     unknown  . Paroxetine     REACTION: goofy    Medication list has been reviewed and updated.  Current Outpatient Prescriptions on File Prior to Visit  Medication Sig Dispense Refill  . amLODipine (NORVASC) 10 MG tablet Take 5 mg by mouth daily.      Marland Kitchen aspirin EC 81 MG tablet Take 1 tablet (81 mg total) by mouth daily. 90 tablet 3  . cholecalciferol (VITAMIN D) 1000 UNITS tablet Take 1,000 Units by mouth daily.    . clopidogrel (PLAVIX) 75 MG tablet Take 1 tablet (75 mg total) by mouth daily. (Patient taking differently: Take 75 mg by mouth at bedtime. ) 30 tablet 3  . cyclobenzaprine (FLEXERIL) 10 MG tablet Take 1 tablet (10 mg total) by mouth at bedtime. 30 tablet   . escitalopram (LEXAPRO) 10 MG tablet Take 1 tablet (10 mg total) by mouth 3 (three) times daily.    . folic acid (FOLVITE) 1 MG tablet Take 1 mg by mouth daily.     Marland Kitchen gabapentin (NEURONTIN) 300 MG capsule Take 600 mg by mouth 2 (two) times daily.    . hydrOXYzine (ATARAX) 50 MG tablet Take 50 mg by mouth 2 (two) times daily.     Marland Kitchen levothyroxine (SYNTHROID, LEVOTHROID) 75 MCG tablet Take 1 tablet (75 mcg total) by mouth daily before breakfast.    . methotrexate (RHEUMATREX) 2.5 MG tablet Take 12.5 mg by mouth once a week. Caution:Chemotherapy. Protect from light.Take on Friday    . mirtazapine (REMERON) 15 MG tablet Take 15 mg  by mouth at bedtime.      . multivitamin (THERAGRAN) per tablet Take 1 tablet by mouth daily.      Marland Kitchen oxybutynin (DITROPAN) 5 MG tablet Take 5 mg by mouth at bedtime.    . pantoprazole (PROTONIX) 40 MG tablet Take 40 mg by mouth daily.      Marland Kitchen PRAVASTATIN SODIUM PO Take 75 mg by mouth daily.    . prazosin (  MINIPRESS) 2 MG capsule Take 2 capsules (4 mg total) by mouth at bedtime.    . Thiamine HCl (VITAMIN B-1) 250 MG tablet Take 250 mg by mouth daily.      . vitamin E 600 UNIT capsule Take 600 Units by mouth daily.      . finasteride (PROSCAR) 5 MG tablet Take 5 mg by mouth at bedtime. Reported on 05/15/2015     No current facility-administered medications on file prior to visit.    ROS ROS otherwise unremarkable unless listed above.   Physical Examination: BP 132/74 mmHg  Pulse 81  Temp(Src) 98.3 F (36.8 C) (Oral)  Resp 16  Ht '6\' 2"'$  (1.88 m)  Wt 177 lb (80.287 kg)  BMI 22.72 kg/m2  SpO2 98% Ideal Body Weight: Weight in (lb) to have BMI = 25: 194.3 Weight: 177 lb (80.287 kg)  Wt Readings from Last 3 Encounters:  05/15/15 177 lb (80.287 kg)  03/30/15 180 lb (81.647 kg)  02/21/15 184 lb (83.462 kg)    Physical Exam  Constitutional: He is oriented to person, place, and time. He appears well-developed and well-nourished. No distress.  HENT:  Head: Normocephalic and atraumatic.  Eyes: Conjunctivae and EOM are normal. Pupils are equal, round, and reactive to light.  Neck: No thyroid mass present.  Right anterior cervical lymph node enlargement tender adjacent to carotid artery which has 2+ without bruit.  No supraclavicular lymphadenopathy.  Cervical without other lymphadenopathy as well.   Cardiovascular: Normal rate.   Pulmonary/Chest: Effort normal. No respiratory distress. He has no decreased breath sounds. He has no wheezes. He has no rhonchi.  Musculoskeletal:  Musculoskeletal bulge consistent with spasming present at the right shoulder blade upon palpation.  This area was  tender.  There is no erythema, or skin changes.   Pain with external rotation, though normal rom.  Normal strength throughout upper extremity.  Neurological: He is alert and oriented to person, place, and time.  Skin: Skin is warm and dry. He is not diaphoretic.  Psychiatric: He has a normal mood and affect. His behavior is normal.    Results for orders placed or performed in visit on 05/15/15  POCT rapid strep A  Result Value Ref Range   Rapid Strep A Screen Negative Negative  POCT CBC  Result Value Ref Range   WBC 8.9 4.6 - 10.2 K/uL   Lymph, poc 2.1 0.6 - 3.4   POC LYMPH PERCENT 23.8 10 - 50 %L   MID (cbc) 0.4 0 - 0.9   POC MID % 4.4 0 - 12 %M   POC Granulocyte 6.4 2 - 6.9   Granulocyte percent 71.8 37 - 80 %G   RBC 4.49 (A) 4.69 - 6.13 M/uL   Hemoglobin 13.7 (A) 14.1 - 18.1 g/dL   HCT, POC 40.7 (A) 43.5 - 53.7 %   MCV 90.5 80 - 97 fL   MCH, POC 30.4 27 - 31.2 pg   MCHC 33.6 31.8 - 35.4 g/dL   RDW, POC 15.2 %   Platelet Count, POC 112 (A) 142 - 424 K/uL   MPV 9.0 0 - 99.8 fL     Assessment and Plan: MARY HOCKEY is a 65 y.o. male who is here today for swollen lymph nodes.   augmentin given today.  Advised to return in 2 weeks for follow up.  Patient had shifting hx, and I will perform the labs below, prior to his follow up. Patient is on cyclobenzaprine which he takes 3  times per day.  I have advised stretching of the upper extremity and shoulder, however I will defer follow up to his PCP who is addressing this shoulder pain.  He does heavy yard work daily.    Lymphadenopathy, anterior cervical - Plan: amoxicillin-clavulanate (AUGMENTIN) 875-125 MG tablet  Loss of weight - Plan: COMPLETE METABOLIC PANEL WITH GFR, POCT rapid strep A, POCT CBC, TSH, CANCELED: POCT CBC, CANCELED: TSH, CANCELED: COMPLETE METABOLIC PANEL WITH GFR  Throat pain - Plan: POCT rapid strep A, POCT CBC   Ivar Drape, PA-C [facility] 05/15/2015 3:39 PM

## 2015-05-15 NOTE — Patient Instructions (Signed)
Please take the antibiotic as prescribed.   Let's see you back in 2 weeks to determine if this lymphadenopathy has resolved. Amoxicillin; Clavulanic Acid tablets What is this medicine? AMOXICILLIN; CLAVULANIC ACID (a mox i SIL in; KLAV yoo lan ic AS id) is a penicillin antibiotic. It is used to treat certain kinds of bacterial infections. It will not work for colds, flu, or other viral infections. This medicine may be used for other purposes; ask your health care provider or pharmacist if you have questions. What should I tell my health care provider before I take this medicine? They need to know if you have any of these conditions: -bowel disease, like colitis -kidney disease -liver disease -mononucleosis -an unusual or allergic reaction to amoxicillin, penicillin, cephalosporin, other antibiotics, clavulanic acid, other medicines, foods, dyes, or preservatives -pregnant or trying to get pregnant -breast-feeding How should I use this medicine? Take this medicine by mouth with a full glass of water. Follow the directions on the prescription label. Take at the start of a meal. Do not crush or chew. If the tablet has a score line, you may cut it in half at the score line for easier swallowing. Take your medicine at regular intervals. Do not take your medicine more often than directed. Take all of your medicine as directed even if you think you are better. Do not skip doses or stop your medicine early. Talk to your pediatrician regarding the use of this medicine in children. Special care may be needed. Overdosage: If you think you have taken too much of this medicine contact a poison control center or emergency room at once. NOTE: This medicine is only for you. Do not share this medicine with others. What if I miss a dose? If you miss a dose, take it as soon as you can. If it is almost time for your next dose, take only that dose. Do not take double or extra doses. What may interact with this  medicine? -allopurinol -anticoagulants -birth control pills -methotrexate -probenecid This list may not describe all possible interactions. Give your health care provider a list of all the medicines, herbs, non-prescription drugs, or dietary supplements you use. Also tell them if you smoke, drink alcohol, or use illegal drugs. Some items may interact with your medicine. What should I watch for while using this medicine? Tell your doctor or health care professional if your symptoms do not improve. Do not treat diarrhea with over the counter products. Contact your doctor if you have diarrhea that lasts more than 2 days or if it is severe and watery. If you have diabetes, you may get a false-positive result for sugar in your urine. Check with your doctor or health care professional. Birth control pills may not work properly while you are taking this medicine. Talk to your doctor about using an extra method of birth control. What side effects may I notice from receiving this medicine? Side effects that you should report to your doctor or health care professional as soon as possible: -allergic reactions like skin rash, itching or hives, swelling of the face, lips, or tongue -breathing problems -dark urine -fever or chills, sore throat -redness, blistering, peeling or loosening of the skin, including inside the mouth -seizures -trouble passing urine or change in the amount of urine -unusual bleeding, bruising -unusually weak or tired -white patches or sores in the mouth or throat Side effects that usually do not require medical attention (report to your doctor or health care professional if they  continue or are bothersome): -diarrhea -dizziness -headache -nausea, vomiting -stomach upset -vaginal or anal irritation This list may not describe all possible side effects. Call your doctor for medical advice about side effects. You may report side effects to FDA at 1-800-FDA-1088. Where should I  keep my medicine? Keep out of the reach of children. Store at room temperature below 25 degrees C (77 degrees F). Keep container tightly closed. Throw away any unused medicine after the expiration date. NOTE: This sheet is a summary. It may not cover all possible information. If you have questions about this medicine, talk to your doctor, pharmacist, or health care provider.    2016, Elsevier/Gold Standard. (2007-05-28 12:04:30)

## 2015-05-17 LAB — CULTURE, GROUP A STREP

## 2015-05-22 ENCOUNTER — Telehealth: Payer: Self-pay

## 2015-05-22 NOTE — Telephone Encounter (Signed)
Pt is wanting to let Colletta [place] know that augmentin is working and the swelling in throat is gone -he wants to know if stephanie feels he need to come back in for follow up  Best number [phone]

## 2015-05-22 NOTE — Telephone Encounter (Signed)
Advised pt

## 2015-05-22 NOTE — Telephone Encounter (Signed)
This is good to read!!  Please tell him that if this has gone down, then finish abx, and only return if needed.

## 2015-05-24 LAB — BASIC METABOLIC PANEL
BUN: 10 (ref 4–21)
Creatinine: 1 (ref 0.6–1.3)
GLUCOSE: 86
Potassium: 3.7 (ref 3.4–5.3)
SODIUM: 143 (ref 137–147)

## 2015-05-24 LAB — HEPATIC FUNCTION PANEL
ALK PHOS: 109 (ref 25–125)
ALT: 26 (ref 10–40)
AST: 26 (ref 14–40)
BILIRUBIN, TOTAL: 0.3

## 2015-05-24 LAB — CBC AND DIFFERENTIAL
HEMATOCRIT: 41 (ref 41–53)
HEMOGLOBIN: 13.4 — AB (ref 13.5–17.5)
PLATELETS: 169 (ref 150–399)
WBC: 7.7

## 2015-05-28 ENCOUNTER — Ambulatory Visit (INDEPENDENT_AMBULATORY_CARE_PROVIDER_SITE_OTHER): Payer: Medicare Other | Admitting: Physician Assistant

## 2015-05-28 VITALS — BP 126/68 | HR 79 | Temp 98.0°F | Resp 16 | Ht 74.0 in | Wt 176.4 lb

## 2015-05-28 DIAGNOSIS — L723 Sebaceous cyst: Secondary | ICD-10-CM | POA: Diagnosis not present

## 2015-05-28 DIAGNOSIS — R682 Dry mouth, unspecified: Secondary | ICD-10-CM

## 2015-05-28 DIAGNOSIS — L089 Local infection of the skin and subcutaneous tissue, unspecified: Secondary | ICD-10-CM

## 2015-05-28 LAB — POCT SKIN KOH: SKIN KOH, POC: POSITIVE

## 2015-05-28 MED ORDER — FLUCONAZOLE 100 MG PO TABS: 100.0000 mg | ORAL_TABLET | Freq: Every day | ORAL | Status: AC

## 2015-05-28 MED ORDER — CEPHALEXIN 500 MG PO CAPS: 500.0000 mg | ORAL_CAPSULE | Freq: Two times a day (BID) | ORAL | Status: AC

## 2015-05-28 NOTE — Progress Notes (Signed)
David David  MRN: [id number] DOB: 11-16-1950  Subjective:  The patient is a 65 year old white male with a PMH of GERD, PTSD, PVD, depression with anxiety, HLD, and HTN who presents with a complaint of "two bumps on his back." The patient states the bumps have been there for about 5-7 months and are growing in size. He states the pain is 3/10, and describes it as uncomfortable and slightly warm to touch. He has taken Prid a homeopathic remedy which provided minimal relief, and tylenol prn. Denies fevers or chills. He endorses an unintentional weight loss of 10-15 lbs over the past year. The "bumps" are non pruritic.  The patient also complains of dry mouth, which he attributes recent course of antibiotics. Denies white spots or crusting in the mouth. Patient is currently smokes a pack of cigarettes a day.   Patient Active Problem List   Diagnosis Date Noted  . Family history of first degree relative with dementia 03/30/2015  . Neuropathy ([place]) 03/30/2015  . Memory loss 02/21/2015  . Right upper quadrant abdominal pain 08/09/2014  . Chest pain 07/19/2014  . Essential hypertension 07/19/2014  . Hyperlipidemia 07/19/2014  . Hypothyroidism 07/19/2014  . Malnutrition of moderate degree ([place]) 07/19/2014  . Unstable angina ([place])   . PAD (peripheral artery disease) ([place])   . TOBACCO ABUSE 09/27/2008  . CHOLESTEROL EMBOLIZATION SYNDROME 09/27/2008  . PERIPHERAL VASCULAR DISEASE 09/27/2008  . GERD 09/27/2008  . DYSFUNCTION, EUSTACHIAN TUBE 10/09/2006  . LOW HDL 07/28/2006  . DEPRESSION 07/28/2006  . PULMONARY NODULE 07/28/2006  . DIVERTICULOSIS, COLON 07/28/2006  . ERECTILE DYSFUNCTION, ORGANIC 07/28/2006  . SPRAIN/STRAIN, NECK 07/28/2006  . COLONIC POLYPS, HX OF 07/28/2006    Current Outpatient Prescriptions on File Prior to Visit  Medication Sig Dispense Refill  . amLODipine (NORVASC) 10 MG tablet Take 5 mg by mouth daily.      Marland Kitchen aspirin EC 81 MG tablet Take 1 tablet (81 mg  total) by mouth daily. 90 tablet 3  . cholecalciferol (VITAMIN D) 1000 UNITS tablet Take 1,000 Units by mouth daily.    . clopidogrel (PLAVIX) 75 MG tablet Take 1 tablet (75 mg total) by mouth daily. (Patient taking differently: Take 75 mg by mouth at bedtime. ) 30 tablet 3  . cyclobenzaprine (FLEXERIL) 10 MG tablet Take 1 tablet (10 mg total) by mouth at bedtime. 30 tablet   . escitalopram (LEXAPRO) 10 MG tablet Take 1 tablet (10 mg total) by mouth 3 (three) times daily.    . finasteride (PROSCAR) 5 MG tablet Take 5 mg by mouth at bedtime. Reported on 1/49/7026    . folic acid (FOLVITE) 1 MG tablet Take 1 mg by mouth daily.     Marland Kitchen gabapentin (NEURONTIN) 300 MG capsule Take 600 mg by mouth 2 (two) times daily.    . hydrOXYzine (ATARAX) 50 MG tablet Take 50 mg by mouth 2 (two) times daily.     Marland Kitchen levothyroxine (SYNTHROID, LEVOTHROID) 75 MCG tablet Take 1 tablet (75 mcg total) by mouth daily before breakfast.    . methotrexate (RHEUMATREX) 2.5 MG tablet Take 12.5 mg by mouth once a week. Caution:Chemotherapy. Protect from light.Take on Friday    . mirtazapine (REMERON) 15 MG tablet Take 15 mg by mouth at bedtime.      . multivitamin (THERAGRAN) per tablet Take 1 tablet by mouth daily.      Marland Kitchen oxybutynin (DITROPAN) 5 MG tablet Take 5 mg by mouth at bedtime.    Marland Kitchen  pantoprazole (PROTONIX) 40 MG tablet Take 40 mg by mouth daily.      Marland Kitchen PRAVASTATIN SODIUM PO Take 75 mg by mouth daily.    . prazosin (MINIPRESS) 2 MG capsule Take 2 capsules (4 mg total) by mouth at bedtime.    . Thiamine HCl (VITAMIN B-1) 250 MG tablet Take 250 mg by mouth daily.      . vitamin E 600 UNIT capsule Take 600 Units by mouth daily.       No current facility-administered medications on file prior to visit.    Allergies  Allergen Reactions  . Neomycin-Bacitracin Zn-Polymyx     unknown  . Niacin     unknown  . Paroxetine     REACTION: goofy    Review of Systems  Constitutional: Negative for fever and chills.  Skin:  Positive for wound.   Objective:  BP 126/68 mmHg  Pulse 79  Temp(Src) 98 F (36.7 C) (Oral)  Resp 16  Ht '6\' 2"'$  (1.88 m)  Wt 176 lb 6.4 oz (80.015 kg)  BMI 22.64 kg/m2  SpO2 97%  Physical Exam  Constitutional: He is oriented to person, place, and time and well-developed, well-nourished, and in no distress.  HENT:  Head: Normocephalic and atraumatic.  Right Ear: External ear normal.  Left Ear: External ear normal.  Tongue mild red with deep crevices   Eyes: Conjunctivae are normal.  Neck: Normal range of motion.  Pulmonary/Chest: Effort normal.  Neurological: He is alert and oriented to person, place, and time. Gait normal.  Skin: Skin is warm and dry.  Psychiatric: Mood, memory, affect and judgment normal.   Results for orders placed or performed in visit on 05/28/15  POCT Skin KOH  Result Value Ref Range   Skin KOH, POC Positive    Procedure: Consent obtained.  I was present for the entire procedure and participated throughout the procedure.  Assessment and Plan :  Dry mouth - Plan: POCT Skin KOH, fluconazole (DIFLUCAN) 100 MG tablet  Infected sebaceous cyst - Plan: cephALEXin (KEFLEX) 500 MG capsule, Wound culture   Warm compresses.  Continue drsg change daily.  Recheck if problems.    Windell Hummingbird PA-C  [facility] 05/28/2015 5:51 PM

## 2015-05-28 NOTE — Progress Notes (Signed)
Subjective:     Patient ID: David David, male   DOB: October 22, 1950, 65 y.o.   MRN: [id number]  HPI The patient is a 65 year old white male with a PMH of GERD, PTSD, PVD, depression with anxiety, HLD, and HTN who presents with a complaint of "two bumps on his back." The patient states the bumps have been there for about 5-7 months and are growing in size. He states the pain is 3/10, and describes it as uncomfortable and slightly warm to touch. He has taken Prid a homeopathic remedy which provided minimal relief, and tylenol prn. Denies fevers or chills. He endorses an unintentional weight loss of 10-15 lbs over the past year.  The "bumps" are non pruritic.   The patient also complains of dry mouth, which he attributes recent course of antibiotics. Denies white spots or crusting in the mouth. Patient is currently smokes a pack of cigarettes a day.  Review of Systems All pertinent ROS as above in HPI     Objective:   Physical Exam  Constitutional: He appears well-developed and well-nourished. No distress.  HENT:  Head: Normocephalic and atraumatic.  Right Ear: External ear normal.  Left Ear: External ear normal.  Nose: Nose normal.  Mouth/Throat: Oropharynx is clear and moist. No oropharyngeal exudate.  Hyperemic beefy tongue with increased gooves and markings. No white plaques or exudates.  Neck: Neck supple.  Cardiovascular: Normal rate, regular rhythm, normal heart sounds and intact distal pulses.  Exam reveals no gallop and no friction rub.   No murmur heard. Lymphadenopathy:    He has cervical adenopathy.   Results for orders placed or performed in visit on 05/28/15  POCT Skin KOH  Result Value Ref Range   Skin KOH, POC Positive     Procedure note: The patient was laying prone to exposed back tissue. Alcohol pad was applied for disinfectant to the skin. 2% lidocaine was injected. Sterilization of the skin was performed by using iodine swabs. Using a 64m punch biopsy a circular  incision was made in the center. Pustular discharge was expressed out by pushing the ends of the wound together. An addition incision was made 1cm medial connected to the original circular incision site. Additional pustular drainage was expressed out. A non-adhesive bandage was placed over the incision site, with 4X4 gauze and a hypafix dressing was placed on top.      Assessment:     The patient is a 65year old white male with a PMH of GERD, HTN, HLD, PTSD, depression and anxiety, and PVD who presented with chief complaint of bump on his back. 2cm circular erythematous elevated area, appears to be an infected sebaceous cyst, which is warm and tender to touch. I&D performed with purulent drainage removed, wound culture sent. Due to infection patient also requires course of antibiotics to prevent further development of cellulitic infection.  Patient also endorses dry mouth and large beefy tongue, patient has PMH of oral thrush and states it feels similar. Due to patient recently stopping amoxicillin antibiotic and immunocompromised on methotrexate, performed KOH prep to verify oral fungal infection. KOH prep positive, patient requires oral fluconazole for treatment. 14 day course prescribed for the patient to take while he is on Cefalexin for 7 days and an additional 7 days following his antibiotic course.     Plan:     1. Dry mouth & mucocutaneous candidiasis - POCT Skin KOH - fluconazole (DIFLUCAN) 100 MG tablet; Take 1 tablet (100 mg total) by  mouth daily.  Dispense: 14 tablet; Refill: 0 - Encouraged patient to continue good oral hygiene with mouthwash and tooth brushing  2. Infected sebaceous cyst - cephALEXin (KEFLEX) 500 MG capsule; Take 1 capsule (500 mg total) by mouth 2 (two) times daily.  Dispense: 14 capsule; Refill: 0 - Wound culture pending - I & D performed, wound care discussed with patient. Replace bandage if wet, patient is able to wash regularly with soap and water avoid use of  creams and OTC ointments.  - Follow up in 48 hours if the incision site increases in size, redness or becomes tender or swollen.

## 2015-05-28 NOTE — Patient Instructions (Signed)
Sebaceous Cyst Removal Sebaceous cyst removal is a procedure to remove a sac of oily material that forms under your skin (sebaceous cyst). Sebaceous cysts may also be called epidermoid cysts or keratin cysts. Normally, the skin secretes this oily material through a gland or a hair follicle. This type of cyst usually results when a skin gland or hair follicle becomes blocked. You may need this procedure if you have a sebaceous cyst that becomes large, uncomfortable, or infected. LET [facility] CARE PROVIDER KNOW ABOUT:  Any allergies you have.  All medicines you are taking, including vitamins, herbs, eye drops, creams, and over-the-counter medicines.  Previous problems you or members of your family have had with the use of anesthetics.  Any blood disorders you have.  Previous surgeries you have had.  Medical conditions you have. RISKS AND COMPLICATIONS Generally, this is a safe procedure. However, problems may occur, including:  Developing another cyst.  Bleeding.  Infection.  Scarring. BEFORE THE PROCEDURE  Ask your health care provider about:  Changing or stopping your regular medicines. This is especially important if you are taking diabetes medicines or blood thinners.  Taking medicines such as aspirin and ibuprofen. These medicines can thin your blood. Do not take these medicines before your procedure if your health care provider instructs you not to.  If you have an infected cyst, you may have to take antibiotic medicines before or after the cyst removal. Take your antibiotics as directed by your health care provider. Finish all of the medicine even if you start to feel better.  Take a shower on the morning of your procedure. Your health care provider may ask you to use a germ-killing (antiseptic) soap. PROCEDURE  You will be given a medicine that numbs the area (local anesthetic).  The skin around the cyst will be cleaned with a germ-killing solution  (antiseptic).  Your health care provider will make a small surgical incision over the cyst.  The cyst will be separated from the surrounding tissues that are under your skin.  If possible, the cyst will be removed undamaged (intact).  If the cyst bursts (ruptures), it will need to be removed in pieces.  After the cyst is removed, your health care provider will control any bleeding and close the incision with small stitches (sutures). Small incisions may not need sutures, and the bleeding will be controlled by applying direct pressure with gauze.  Your health care provider may apply antibiotic ointment and a light bandage (dressing) over the incision. This procedure may vary among health care providers and hospitals. AFTER THE PROCEDURE  If your cyst ruptured during surgery, you may need to take antibiotic medicine. If you were prescribed an antibiotic medicine, finish all of it even if you start to feel better.   This information is not intended to replace advice given to you by your health care provider. Make sure you discuss any questions you have with your health care provider.   Document Released: 03/01/2000 Document Revised: 03/25/2014 Document Reviewed: 11/17/2013 Elsevier Interactive Patient Education [employer].

## 2015-05-30 LAB — WOUND CULTURE
GRAM STAIN: NONE SEEN
Gram Stain: NONE SEEN
Organism ID, Bacteria: NORMAL

## 2015-06-16 NOTE — Progress Notes (Signed)
History and physical examinations obtained with PA English. Agree with assessment and plan. Azarel Banner Elayne Guerin, M.D. [facility] [address] [place], [place]  [address] [phone] phone [phone] fax

## 2015-06-21 LAB — BASIC METABOLIC PANEL
BUN: 16 (ref 4–21)
CREATININE: 0.9 (ref 0.6–1.3)
Glucose: 81
Potassium: 4.2 (ref 3.4–5.3)
Sodium: 144 (ref 137–147)

## 2015-06-21 LAB — HEPATIC FUNCTION PANEL
ALK PHOS: 122 (ref 25–125)
ALT: 32 (ref 10–40)
AST: 34 (ref 14–40)
Bilirubin, Total: 0.2

## 2015-06-21 LAB — CBC AND DIFFERENTIAL
HEMATOCRIT: 40 — AB (ref 41–53)
Hemoglobin: 13.3 — AB (ref 13.5–17.5)
Platelets: 193 (ref 150–399)
WBC: 9.1

## 2015-08-17 ENCOUNTER — Encounter: Payer: Self-pay | Admitting: Family Medicine

## 2015-10-03 ENCOUNTER — Ambulatory Visit (INDEPENDENT_AMBULATORY_CARE_PROVIDER_SITE_OTHER): Payer: Medicare Other | Admitting: Physician Assistant

## 2015-10-03 VITALS — BP 126/82 | HR 74 | Temp 98.2°F | Resp 16 | Ht 74.5 in | Wt 174.0 lb

## 2015-10-03 DIAGNOSIS — W57XXXA Bitten or stung by nonvenomous insect and other nonvenomous arthropods, initial encounter: Secondary | ICD-10-CM | POA: Diagnosis not present

## 2015-10-03 DIAGNOSIS — S70362A Insect bite (nonvenomous), left thigh, initial encounter: Secondary | ICD-10-CM

## 2015-10-03 NOTE — Progress Notes (Signed)
[facility] [address] [place] [address] [phone]  Date:  10/03/2015   Name:  David David   DOB:  1951-02-14   MRN:  [id number]  PCP:  [facility]    Chief Complaint: Tick Removal   History of Present Illness:  This is a 65 y.o. male with PMH HTN, HLD, hypothyroidism, PVD, GERD who is presenting with a tick bite. Occurred 3 days ago. He states [place] He thinks the tick was attached <12 hours. He was able to remove the tick in two pieces. The head was embedded. He was able to remove the entire head. He brought the tick with him today in a plastic bag. He states the tick bite is itchy. No pain. He did notice the area looks swollen. He denies fever, chills, new arthralgia, headache, n/v, other rashes.  Review of Systems:  Review of Systems See HPI  Patient Active Problem List   Diagnosis Date Noted  . Family history of first degree relative with dementia 03/30/2015  . Neuropathy ([place]) 03/30/2015  . Memory loss 02/21/2015  . Right upper quadrant abdominal pain 08/09/2014  . Chest pain 07/19/2014  . Essential hypertension 07/19/2014  . Hyperlipidemia 07/19/2014  . Hypothyroidism 07/19/2014  . Malnutrition of moderate degree ([place]) 07/19/2014  . Unstable angina ([place])   . PAD (peripheral artery disease) ([place])   . TOBACCO ABUSE 09/27/2008  . CHOLESTEROL EMBOLIZATION SYNDROME 09/27/2008  . PERIPHERAL VASCULAR DISEASE 09/27/2008  . GERD 09/27/2008  . DYSFUNCTION, EUSTACHIAN TUBE 10/09/2006  . LOW HDL 07/28/2006  . DEPRESSION 07/28/2006  . PULMONARY NODULE 07/28/2006  . DIVERTICULOSIS, COLON 07/28/2006  . ERECTILE DYSFUNCTION, ORGANIC 07/28/2006  . SPRAIN/STRAIN, NECK 07/28/2006  . COLONIC POLYPS, HX OF 07/28/2006    Prior to Admission medications   Medication Sig Start Date End Date Taking? Authorizing Provider  amLODipine (NORVASC) 10 MG tablet Take 5 mg by mouth daily.      Yes Historical Provider, MD  aspirin EC 81 MG tablet Take 1 tablet (81 mg total) by mouth daily. 10/09/12  Yes Sherren Mocha, MD  cholecalciferol (VITAMIN D) 1000 UNITS tablet Take 1,000 Units by mouth daily.   Yes Historical Provider, MD  clopidogrel (PLAVIX) 75 MG tablet Take 1 tablet (75 mg total) by mouth daily. Patient taking differently: Take 75 mg by mouth at bedtime.  11/04/12  Yes Wellington Hampshire, MD  cyclobenzaprine (FLEXERIL) 10 MG tablet Take 1 tablet (10 mg total) by mouth at bedtime. 10/28/12  Yes Sherren Mocha, MD  escitalopram (LEXAPRO) 10 MG tablet Take 1 tablet (10 mg total) by mouth 3 (three) times daily. 10/28/12  Yes Sherren Mocha, MD  finasteride (PROSCAR) 5 MG tablet Take 5 mg by mouth at bedtime. Reported on 05/15/2015   Yes Historical Provider, MD  folic acid (FOLVITE) 1 MG tablet Take 1 mg by mouth daily.    Yes Historical Provider, MD  gabapentin (NEURONTIN) 300 MG capsule Take 600 mg by mouth 2 (two) times daily.   Yes Historical Provider, MD  hydrOXYzine (ATARAX) 50 MG tablet Take 50 mg by mouth 2 (two) times daily.    Yes Historical Provider, MD  levothyroxine (SYNTHROID, LEVOTHROID) 75 MCG tablet Take 1 tablet (75 mcg total) by mouth daily before breakfast. 10/28/12  Yes Sherren Mocha, MD  methotrexate (RHEUMATREX) 2.5 MG tablet Take 12.5 mg by mouth once a week. Caution:Chemotherapy. Protect from light.Take on Friday  Yes Historical Provider, MD  mirtazapine (REMERON) 15 MG tablet Take 15 mg by mouth at bedtime.     Yes Historical Provider, MD  multivitamin [facility]) per tablet Take 1 tablet by mouth daily.     Yes Historical Provider, MD  oxybutynin (DITROPAN) 5 MG tablet Take 5 mg by mouth at bedtime.   Yes Historical Provider, MD  pantoprazole (PROTONIX) 40 MG tablet Take 40 mg by mouth daily.     Yes Historical Provider, MD  PRAVASTATIN SODIUM PO Take 75 mg by mouth daily.   Yes Historical Provider, MD  prazosin (MINIPRESS) 2 MG capsule Take 2 capsules (4 mg  total) by mouth at bedtime. 10/29/12  Yes Sherren Mocha, MD  Thiamine HCl (VITAMIN B-1) 250 MG tablet Take 250 mg by mouth daily.     Yes Historical Provider, MD  vitamin E 600 UNIT capsule Take 600 Units by mouth daily.     Yes Historical Provider, MD    Allergies  Allergen Reactions  . Neomycin-Bacitracin Zn-Polymyx     unknown  . Niacin     unknown  . Paroxetine     REACTION: goofy    Past Surgical History  Procedure Laterality Date  . Angiography      bilateral lower extremity angiography, stenting of the left.   . Popliteal artery angioplasty      didnt specify angioplasty or stent  . Spermatocelectomy      left  . Lower extremity angiogram N/A 11/04/2012    Procedure: LOWER EXTREMITY ANGIOGRAM;  Surgeon: Wellington Hampshire, MD;  Location: [facility] CATH LAB;  Service: Cardiovascular;  Laterality: N/A;  . Cardiac catheterization N/A 07/19/2014    Procedure: Left Heart Cath and Coronary Angiography;  Surgeon: Leonie Man, MD;  Location: [facility] INVASIVE CV LAB CUPID;  Service: Cardiovascular;  Laterality: N/A;  . Eye surgery Right     Social History  Substance Use Topics  . Smoking status: Former Smoker    Types: Cigarettes  . Smokeless tobacco: Never Used     Comment: QUIT 2009  . Alcohol Use: 0.0 oz/week    0 Standard drinks or equivalent per week     Comment:      Family History  Problem Relation Age of Onset  . CAD Father   . Congestive Heart Failure Mother   . Cancer Brother   . Alzheimer's disease Sister   . Alzheimer's disease Brother   . Diabetes Brother     Medication list has been reviewed and updated.  Physical Examination:  Physical Exam  Constitutional: He is oriented to person, place, and time. He appears well-developed and well-nourished. No distress.  HENT:  Head: Normocephalic and atraumatic.  Right Ear: Hearing normal.  Left Ear: Hearing normal.  Nose: Nose normal.  Eyes: Conjunctivae and lids are normal. Right eye exhibits no discharge. Left eye  exhibits no discharge. No scleral icterus.  Pulmonary/Chest: Effort normal. No respiratory distress.  Musculoskeletal: Normal range of motion.  Neurological: He is alert and oriented to person, place, and time.  Skin: Skin is warm, dry and intact.  Left lateral thigh with 2 cm area of erythema and mild induration. Central scab. No TTP.  Psychiatric: He has a normal mood and affect. His speech is normal and behavior is normal. Thought content normal.    BP 126/82 mmHg  Pulse 74  Temp(Src) 98.2 F (36.8 C) (Oral)  Resp 16  Ht 6' 2.5" (1.892 m)  Wt 174 lb (78.926 kg)  BMI 22.05  kg/m2  SpO2 98%  Assessment and Plan:  1. Tick bite Tick bite attached for <12 hours, making transmission of disease very unlikely. Area of tick bite looks inflamed but not concerning. Pt will monitor symptoms, return if develop fever, chills, arthralgia, n/v, headache, rash.    Benjaman Pott Drenda Freeze, MHS [facility]  10/03/2015

## 2015-10-03 NOTE — Patient Instructions (Addendum)
Return if you develop fever, chills, headache, joint pain, nausea, vomiting, rash, worsening pain or redness at the site of the tick bite    IF you received an x-ray today, you will receive an invoice from [facility]. Please contact [facility] at [phone] with questions or concerns regarding your invoice.   IF you received labwork today, you will receive an invoice from [employer]. Please contact [facility] at [phone] with questions or concerns regarding your invoice.   Our billing staff will not be able to assist you with questions regarding bills from these companies.  You will be contacted with the lab results as soon as they are available. The fastest way to get your results is to activate your My Chart account. Instructions are located on the last page of this paperwork. If you have not heard from [place] regarding the results in 2 weeks, please contact this office.

## 2015-11-12 ENCOUNTER — Emergency Department (HOSPITAL_BASED_OUTPATIENT_CLINIC_OR_DEPARTMENT_OTHER)
Admission: EM | Admit: 2015-11-12 | Discharge: 2015-11-12 | Disposition: A | Discharge status: Home / Self Care | Payer: Medicare Other | Attending: Emergency Medicine | Admitting: Emergency Medicine

## 2015-11-12 ENCOUNTER — Encounter (HOSPITAL_BASED_OUTPATIENT_CLINIC_OR_DEPARTMENT_OTHER): Payer: Self-pay | Admitting: Emergency Medicine

## 2015-11-12 DIAGNOSIS — E039 Hypothyroidism, unspecified: Secondary | ICD-10-CM | POA: Diagnosis not present

## 2015-11-12 DIAGNOSIS — N189 Chronic kidney disease, unspecified: Secondary | ICD-10-CM | POA: Diagnosis not present

## 2015-11-12 DIAGNOSIS — R21 Rash and other nonspecific skin eruption: Secondary | ICD-10-CM | POA: Diagnosis not present

## 2015-11-12 DIAGNOSIS — Z79899 Other long term (current) drug therapy: Secondary | ICD-10-CM | POA: Insufficient documentation

## 2015-11-12 DIAGNOSIS — Z87891 Personal history of nicotine dependence: Secondary | ICD-10-CM | POA: Diagnosis not present

## 2015-11-12 DIAGNOSIS — Z7982 Long term (current) use of aspirin: Secondary | ICD-10-CM | POA: Insufficient documentation

## 2015-11-12 DIAGNOSIS — I129 Hypertensive chronic kidney disease with stage 1 through stage 4 chronic kidney disease, or unspecified chronic kidney disease: Secondary | ICD-10-CM | POA: Insufficient documentation

## 2015-11-12 MED ORDER — PREDNISONE 50 MG PO TABS: 50.0000 mg | ORAL_TABLET | Freq: Every day | ORAL | 0 refills | Status: DC

## 2015-11-12 NOTE — ED Triage Notes (Signed)
Patient states that he has had a rash to his whole body x 2 days

## 2015-11-12 NOTE — Discharge Instructions (Signed)
Return here as needed.  Follow-up with the dermatologist provider or at the [facility]

## 2015-11-12 NOTE — ED Notes (Signed)
PA student at bedside.

## 2015-11-12 NOTE — ED Notes (Signed)
CN into room, pt updated about pending d/c.

## 2015-11-12 NOTE — ED Notes (Addendum)
CN into room. Pt upset about wait, and team approach with multiple faces. Attempted service recovery with d/c. Denies questions or needs, given Rx x1. Out with wife. Pt alert, NAD, calm, interactive, resps e/u, no dyspnea noted, steady gait.

## 2015-11-12 NOTE — ED Provider Notes (Signed)
[facility] DEPT [facility] Provider Note   CSN: [id number] Arrival date & time: 11/12/15  1951  By signing my name below, I, David David, attest that this documentation has been prepared under the direction and in the presence of [employer], PA-C. Electronically Signed: Georgette David, ED Scribe. 11/12/15. 9:03 PM.  History   Chief Complaint Chief Complaint  Patient presents with  . Rash   HPI Comments:  David David is a 65 y.o. male who presents to the Emergency Department complaining of sudden onset, constant generalized rash onset two days ago. Per pt, the rash started on his inner thighs then to his chest, bilateral ankles and in his groin area. Pt tried Cortisone with moderate relief. Pt reports that he had a tick bite one month ago and went to the urgent care but they did not treat it. Denies fever, diaphoresis, chest pain, shortness of breath, dysuria, abdominal pain, or any other associated symptoms. Pt denies any pain at this time.  The history is provided by the patient. No language interpreter was used.    Past Medical History:  Diagnosis Date  . Alcoholism in family   . Anxiety   . Depressive disorder, not elsewhere classified   . Diverticulosis of colon (without mention of hemorrhage)   . Dysfunction of eustachian tube   . Exposure to STD   . Family history of diabetes mellitus   . Family history of psychiatric condition    family Hx of suicide   . GERD (gastroesophageal reflux disease)   . Impotence of organic origin    erectile dysfunction  . Lipoprotein deficiencies   . Other diseases of lung, not elsewhere classified    pulmonary nodule  . Peripheral vascular disease, unspecified ([place])    lower extremity peripheral artery disease  . Personal history of colonic polyps   . Routine general medical examination at a health care facility   . Sprain of neck    Hx  . Thyroid disease   . Tobacco use disorder   . Unspecified hypertensive kidney disease with  chronic kidney disease stage I through stage IV, or unspecified    cholesterol embolization syndrome    Patient Active Problem List   Diagnosis Date Noted  . Family history of first degree relative with dementia 03/30/2015  . Neuropathy ([place]) 03/30/2015  . Memory loss 02/21/2015  . Right upper quadrant abdominal pain 08/09/2014  . Chest pain 07/19/2014  . Essential hypertension 07/19/2014  . Hyperlipidemia 07/19/2014  . Hypothyroidism 07/19/2014  . Malnutrition of moderate degree ([place]) 07/19/2014  . Unstable angina ([place])   . PAD (peripheral artery disease) ([place])   . TOBACCO ABUSE 09/27/2008  . CHOLESTEROL EMBOLIZATION SYNDROME 09/27/2008  . PERIPHERAL VASCULAR DISEASE 09/27/2008  . GERD 09/27/2008  . DYSFUNCTION, EUSTACHIAN TUBE 10/09/2006  . LOW HDL 07/28/2006  . DEPRESSION 07/28/2006  . PULMONARY NODULE 07/28/2006  . DIVERTICULOSIS, COLON 07/28/2006  . ERECTILE DYSFUNCTION, ORGANIC 07/28/2006  . SPRAIN/STRAIN, NECK 07/28/2006  . COLONIC POLYPS, HX OF 07/28/2006    Past Surgical History:  Procedure Laterality Date  . angiography     bilateral lower extremity angiography, stenting of the left.   [facility] CARDIAC CATHETERIZATION N/A 07/19/2014   Procedure: Left Heart Cath and Coronary Angiography;  Surgeon: Leonie Man, MD;  Location: [facility] INVASIVE CV LAB CUPID;  Service: Cardiovascular;  Laterality: N/A;  . EYE SURGERY Right   . LOWER EXTREMITY ANGIOGRAM N/A 11/04/2012   Procedure: LOWER EXTREMITY ANGIOGRAM;  Surgeon: Wellington Hampshire,  MD;  Location: [facility] CATH LAB;  Service: Cardiovascular;  Laterality: N/A;  . POPLITEAL ARTERY ANGIOPLASTY     didnt specify angioplasty or stent  . SPERMATOCELECTOMY     left       Home Medications    Prior to Admission medications   Medication Sig Start Date End Date Taking? Authorizing Provider  amLODipine (NORVASC) 10 MG tablet Take 5 mg by mouth daily.      Historical Provider, MD  aspirin EC 81 MG tablet Take 1 tablet (81 mg total) by  mouth daily. 10/09/12   Sherren Mocha, MD  cholecalciferol (VITAMIN D) 1000 UNITS tablet Take 1,000 Units by mouth daily.    Historical Provider, MD  clopidogrel (PLAVIX) 75 MG tablet Take 1 tablet (75 mg total) by mouth daily. Patient taking differently: Take 75 mg by mouth at bedtime.  11/04/12   Wellington Hampshire, MD  cyclobenzaprine (FLEXERIL) 10 MG tablet Take 1 tablet (10 mg total) by mouth at bedtime. 10/28/12   Sherren Mocha, MD  escitalopram (LEXAPRO) 10 MG tablet Take 1 tablet (10 mg total) by mouth 3 (three) times daily. 10/28/12   Sherren Mocha, MD  finasteride (PROSCAR) 5 MG tablet Take 5 mg by mouth at bedtime. Reported on 05/15/2015    Historical Provider, MD  folic acid (FOLVITE) 1 MG tablet Take 1 mg by mouth daily.     Historical Provider, MD  gabapentin (NEURONTIN) 300 MG capsule Take 600 mg by mouth 2 (two) times daily.    Historical Provider, MD  hydrOXYzine (ATARAX) 50 MG tablet Take 50 mg by mouth 2 (two) times daily.     Historical Provider, MD  levothyroxine (SYNTHROID, LEVOTHROID) 75 MCG tablet Take 1 tablet (75 mcg total) by mouth daily before breakfast. 10/28/12   Sherren Mocha, MD  methotrexate (RHEUMATREX) 2.5 MG tablet Take 12.5 mg by mouth once a week. Caution:Chemotherapy. Protect from light.Take on Friday    Historical Provider, MD  mirtazapine (REMERON) 15 MG tablet Take 15 mg by mouth at bedtime.      Historical Provider, MD  multivitamin [facility]) per tablet Take 1 tablet by mouth daily.      Historical Provider, MD  oxybutynin (DITROPAN) 5 MG tablet Take 5 mg by mouth at bedtime.    Historical Provider, MD  pantoprazole (PROTONIX) 40 MG tablet Take 40 mg by mouth daily.      Historical Provider, MD  PRAVASTATIN SODIUM PO Take 75 mg by mouth daily.    Historical Provider, MD  prazosin (MINIPRESS) 2 MG capsule Take 2 capsules (4 mg total) by mouth at bedtime. 10/29/12   Sherren Mocha, MD  Thiamine HCl (VITAMIN B-1) 250 MG tablet Take 250 mg by mouth daily.       Historical Provider, MD  vitamin E 600 UNIT capsule Take 600 Units by mouth daily.      Historical Provider, MD    Family History Family History  Problem Relation Age of Onset  . CAD Father   . Congestive Heart Failure Mother   . Cancer Brother   . Alzheimer's disease Sister   . Alzheimer's disease Brother   . Diabetes Brother     Social History Social History  Substance Use Topics  . Smoking status: Former Smoker    Types: Cigarettes  . Smokeless tobacco: Never Used     Comment: QUIT 2009  . Alcohol use 0.0 oz/week     Comment:       Allergies   Neomycin-bacitracin zn-polymyx; Niacin;  and Paroxetine   Review of Systems Review of Systems All other systems negative except as documented in the HPI. All pertinent positives and negatives as reviewed in the HPI. Physical Exam Updated Vital Signs BP 123/80 (BP Location: Left Arm)   Pulse 72   Temp 98.7 F (37.1 C) (Oral)   Resp 18   Ht '6\' 3"'$  (1.905 m)   Wt 173 lb 6.4 oz (78.7 kg)   SpO2 100%   BMI 21.67 kg/m   Physical Exam  Constitutional: He appears well-developed and well-nourished.  HENT:  Head: Normocephalic.  Eyes: Conjunctivae are normal.  Cardiovascular: Normal rate.   Pulmonary/Chest: Effort normal. No respiratory distress.  Abdominal: He exhibits no distension.  Musculoskeletal: Normal range of motion.  Neurological: He is alert.  Skin: Skin is warm and dry. Rash noted.  Raised erythematous papules along his bilateral ankles. He had sporadic reddened, slightly raised areas on bilateral lower legs. One small, raised, darker red lesion on the head of his penis and diffuse rash in his groin / scrotal area. Scabbed over area just below his left breast. Scabbed over tick bite on the left hip region.   Psychiatric: He has a normal mood and affect. His behavior is normal.  Nursing note and vitals reviewed.    ED Treatments / Results  DIAGNOSTIC STUDIES: Oxygen Saturation is 100% on RA, normal by my  interpretation.    COORDINATION OF CARE: 9:34 PM Discussed treatment plan with pt at bedside and pt agreed to plan.  Labs (all labs ordered are listed, but only abnormal results are displayed) Labs Reviewed - No data to display  EKG  EKG Interpretation None       Radiology No results found.  Procedures Procedures (including critical care time)  Medications Ordered in ED Medications - No data to display   Initial Impression / Assessment and Plan / ED Course  I have reviewed the triage vital signs and the nursing notes.  Pertinent labs & imaging results that were available during my care of the patient were reviewed by me and considered in my medical decision making (see chart for details).  Clinical Course    The patient's rashes.  Inconsistent from the ankles to the rash in his groin and abdomen.  I feel that he will need follow-up with her otalgia.  Told to return here as needed.  Patient agrees the plan and all questions were answered  Final Clinical Impressions(s) / ED Diagnoses   Final diagnoses:  None    New Prescriptions New Prescriptions   No medications on file      Dalia Heading, PA-C 11/14/15 [place], MD 11/17/15 [phone]

## 2015-11-12 NOTE — ED Notes (Signed)
PA at bedside.

## 2015-11-12 NOTE — ED Notes (Signed)
EDPA updated.

## 2015-11-12 NOTE — ED Notes (Signed)
Pt requesting service, threatening leaving.

## 2015-11-29 ENCOUNTER — Ambulatory Visit: Payer: Medicare Other | Admitting: Neurology

## 2015-12-06 ENCOUNTER — Encounter: Payer: Self-pay | Admitting: Neurology

## 2015-12-06 ENCOUNTER — Ambulatory Visit (INDEPENDENT_AMBULATORY_CARE_PROVIDER_SITE_OTHER): Payer: Medicare Other | Admitting: Neurology

## 2015-12-06 VITALS — BP 130/72 | HR 84 | Temp 98.2°F | Ht 75.0 in | Wt 169.2 lb

## 2015-12-06 DIAGNOSIS — R413 Other amnesia: Secondary | ICD-10-CM | POA: Diagnosis not present

## 2015-12-06 NOTE — Progress Notes (Signed)
NEUROLOGY FOLLOW UP OFFICE NOTE  David David [id number]  HISTORY OF PRESENT ILLNESS: I had the pleasure of seeing David David in follow-up in the neurology clinic on 12/06/2015.  The patient was last seen 8 months ago for memory changes with strong family history of dementia. His MOCA score in January 2017 was normal 30/30. Records and images were personally reviewed where available.  I personally reviewed MRI brain without contrast which did not show any acute changes, there was mild diffuse atrophy. His B12 was normal. He denies any changes since his last visit. He denies getting lost driving, no missed bill payments or medications. No difficulties with ADLs. He denies any headaches, dizziness, diplopia, dysarthria, dysphagia, anosmia, or tremors.  HPI 03/30/2015: This is a pleasant 65 yo RH man with a history of peripheral vascular disease, hypertension, hyperlipidemia, hypothyroidism, PTSD, who presented for evaluation of worsening memory loss. He has a strong family history of dementia in 3 siblings, who were diagnosed at age 67 or so. He and his daughter started noticing changes a little over a year ago, he would start a conversation and forget what the conversation was about. He would start to say something and forget what he was going to say, getting halfway through a sentence then the rest would not come out. He feels like his head and mouth are not working together. His daughter has noticed this as well, he gets blank when trying to process, with long pauses in conversations that have become more frequent. He denies getting lost driving, but has gotten confused a couple of times. One time he could not find his cousin's house because he forgot what road she lived on. He was eventually able to remember it after speaking to his daughter. He lives with his daughter and denies any missed medications or bill payments (he reports there are no bills to pay). His daughter reports he is pretty  well-organized, he denies misplacing things frequently. She reports he gets a little more frustrated easily, but no paranoia any different from his history of PTSD.   He has blurred vision from a right cataract, occasional neck and back pain, on and off constipation. He has numbness in his feet, no pain.  No significant head injuries. He reports having a seizure in 2005 when he was on a significant amount of Wellbutrin for PTSD. He has PTSD as a [place] veteran. He drinks a glass of wine or beer 1-2 times a week. His older sister was diagnosed with dementia at age 67 or 73 and passed away 2 years ago. His brother now 26 has dementia. His sister is 39 and has dementia. He "does not want to get the way they are if he can get help now."   Laboratory Data: He reports having TSH done at the [place] in October with normal TSH.  PAST MEDICAL HISTORY: Past Medical History:  Diagnosis Date  . Alcoholism in family   . Anxiety   . Depressive disorder, not elsewhere classified   . Diverticulosis of colon (without mention of hemorrhage)   . Dysfunction of eustachian tube   . Exposure to STD   . Family history of diabetes mellitus   . Family history of psychiatric condition    family Hx of suicide   . GERD (gastroesophageal reflux disease)   . Impotence of organic origin    erectile dysfunction  . Lipoprotein deficiencies   . Other diseases of lung, not elsewhere classified    pulmonary nodule  .  Peripheral vascular disease, unspecified ([place])    lower extremity peripheral artery disease  . Personal history of colonic polyps   . Routine general medical examination at a health care facility   . Sprain of neck    Hx  . Thyroid disease   . Tobacco use disorder   . Unspecified hypertensive kidney disease with chronic kidney disease stage I through stage IV, or unspecified    cholesterol embolization syndrome    MEDICATIONS: Current Outpatient Prescriptions on File Prior to Visit  Medication Sig  Dispense Refill  . amLODipine (NORVASC) 10 MG tablet Take 5 mg by mouth daily.      Marland Kitchen aspirin EC 81 MG tablet Take 1 tablet (81 mg total) by mouth daily. 90 tablet 3  . cholecalciferol (VITAMIN D) 1000 UNITS tablet Take 1,000 Units by mouth daily.    . clopidogrel (PLAVIX) 75 MG tablet Take 1 tablet (75 mg total) by mouth daily. (Patient taking differently: Take 75 mg by mouth at bedtime. ) 30 tablet 3  . cyclobenzaprine (FLEXERIL) 10 MG tablet Take 1 tablet (10 mg total) by mouth at bedtime. 30 tablet   . escitalopram (LEXAPRO) 10 MG tablet Take 1 tablet (10 mg total) by mouth 3 (three) times daily.    . finasteride (PROSCAR) 5 MG tablet Take 5 mg by mouth at bedtime. Reported on 1/61/0960    . folic acid (FOLVITE) 1 MG tablet Take 1 mg by mouth daily.     Marland Kitchen gabapentin (NEURONTIN) 300 MG capsule Take 600 mg by mouth 2 (two) times daily.    . hydrOXYzine (ATARAX) 50 MG tablet Take 50 mg by mouth 2 (two) times daily.     Marland Kitchen levothyroxine (SYNTHROID, LEVOTHROID) 75 MCG tablet Take 1 tablet (75 mcg total) by mouth daily before breakfast.    . methotrexate (RHEUMATREX) 2.5 MG tablet Take 12.5 mg by mouth once a week. Caution:Chemotherapy. Protect from light.Take on Friday    . mirtazapine (REMERON) 15 MG tablet Take 15 mg by mouth at bedtime.      . multivitamin (THERAGRAN) per tablet Take 1 tablet by mouth daily.      Marland Kitchen oxybutynin (DITROPAN) 5 MG tablet Take 5 mg by mouth at bedtime.    . pantoprazole (PROTONIX) 40 MG tablet Take 40 mg by mouth daily.      Marland Kitchen PRAVASTATIN SODIUM PO Take 75 mg by mouth daily.    . prazosin (MINIPRESS) 2 MG capsule Take 2 capsules (4 mg total) by mouth at bedtime.    . predniSONE (DELTASONE) 50 MG tablet Take 1 tablet (50 mg total) by mouth daily with breakfast. 5 tablet 0  . Thiamine HCl (VITAMIN B-1) 250 MG tablet Take 250 mg by mouth daily.      . vitamin E 600 UNIT capsule Take 600 Units by mouth daily.       No current facility-administered medications on file  prior to visit.     ALLERGIES: Allergies  Allergen Reactions  . Neomycin-Bacitracin Zn-Polymyx     unknown  . Niacin     unknown  . Paroxetine     REACTION: goofy    FAMILY HISTORY: Family History  Problem Relation Age of Onset  . CAD Father   . Congestive Heart Failure Mother   . Cancer Brother   . Alzheimer's disease Sister   . Alzheimer's disease Brother   . Diabetes Brother     SOCIAL HISTORY: Social History   Social History  . Marital status: Single  Spouse name: N/A  . Number of children: 1  . Years of education: N/A   Occupational History  . Doesn't work    Social History Main Topics  . Smoking status: Former Smoker    Types: Cigarettes  . Smokeless tobacco: Never Used     Comment: QUIT 2009  . Alcohol use 0.0 oz/week     Comment:    . Drug use:     Types: Marijuana  . Sexual activity: Not on file   Other Topics Concern  . Not on file   Social History Narrative   No diet, not restricting cholesterol.    REVIEW OF SYSTEMS: Constitutional: No fevers, chills, or sweats, no generalized fatigue, change in appetite Eyes: No visual changes, double vision, eye pain Ear, nose and throat: No hearing loss, ear pain, nasal congestion, sore throat Cardiovascular: No chest pain, palpitations Respiratory:  No shortness of breath at rest or with exertion, wheezes GastrointestinaI: No nausea, vomiting, diarrhea, abdominal pain, fecal incontinence Genitourinary:  No dysuria, urinary retention or frequency Musculoskeletal:  No neck pain, back pain Integumentary: No rash, pruritus, skin lesions Neurological: as above Psychiatric: No depression, insomnia, anxiety Endocrine: No palpitations, fatigue, diaphoresis, mood swings, change in appetite, change in weight, increased thirst Hematologic/Lymphatic:  No anemia, purpura, petechiae. Allergic/Immunologic: no itchy/runny eyes, nasal congestion, recent allergic reactions, rashes  PHYSICAL EXAM: Vitals:    12/06/15 1015  BP: 130/72  Pulse: 84  Temp: 98.2 F (36.8 C)   General: No acute distress Head:  Normocephalic/atraumatic Neck: supple, no paraspinal tenderness, full range of motion Heart:  Regular rate and rhythm Lungs:  Clear to auscultation bilaterally Back: No paraspinal tenderness Skin/Extremities: No rash, no edema Neurological Exam: alert and oriented to person, place, and time. No aphasia or dysarthria. Fund of knowledge is appropriate.  Recent and remote memory are intact.  Attention and concentration are normal.    Able to name objects and repeat phrases.  Montreal Cognitive Assessment  12/06/2015 03/30/2015  Visuospatial/ Executive (0/5) 5 5  Naming (0/3) 3 3  Attention: Read list of digits (0/2) 2 2  Attention: Read list of letters (0/1) 1 1  Attention: Serial 7 subtraction starting at 100 (0/3) 3 3  Language: Repeat phrase (0/2) 2 2  Language : Fluency (0/1) 1 1  Abstraction (0/2) 2 2  Delayed Recall (0/5) 5 5  Orientation (0/6) 6 6  Total 30 30  Adjusted Score (based on education) 30 30   Cranial nerves: Pupils equal, round, reactive to light.  Extraocular movements intact with no nystagmus. Visual fields full. Facial sensation intact. No facial asymmetry. Tongue, uvula, palate midline.  Motor: Bulk and tone normal, muscle strength 5/5 throughout with no pronator drift.  Sensation to light touch intact.  No extinction to double simultaneous stimulation.  Deep tendon reflexes 2+ throughout, toes downgoing.  Finger to nose testing intact.  Gait narrow-based and steady, able to tandem walk adequately.  Romberg negative.  IMPRESSION: This is a pleasant 65 yo RH man with vascular risk factors including hypertension, hyperlipidemia, peripheral vascular disease, strong family history of dementia, who presented for worsening memory loss, particularly with word-finding difficulties. His neurological exam is non-focal, MOCA score today again normal 30/30. MRI brain unremarkable. No  clear indication to start cholinesterase inhibitors. We discussed the importance of control of vascular risk factors, physical exercise, and brain stimulation exercises for brain health. He will follow-up in 1 year and knows to call for any changes.  Thank you for allowing  me to participate in his care.  Please do not hesitate to call for any questions or concerns.  The duration of this appointment visit was 25 minutes of face-to-face time with the patient.  Greater than 50% of this time was spent in counseling, explanation of diagnosis, planning of further management, and coordination of care.   Ellouise Newer, M.D.   CC: [facility]

## 2015-12-06 NOTE — Patient Instructions (Signed)
You look great. Continue control of blood pressure, cholesterol, as well as physical exercise and brain stimulation exercises for brain health. Follow-up in 1 year, call for any changes. 

## 2016-01-24 ENCOUNTER — Other Ambulatory Visit: Payer: Self-pay | Admitting: Cardiovascular Disease

## 2016-01-24 DIAGNOSIS — I739 Peripheral vascular disease, unspecified: Secondary | ICD-10-CM

## 2016-02-19 ENCOUNTER — Ambulatory Visit (HOSPITAL_COMMUNITY)
Admission: RE | Admit: 2016-02-19 | Discharge: 2016-02-19 | Disposition: A | Discharge status: Home / Self Care | Payer: Medicare Other | Source: Ambulatory Visit | Attending: Cardiology | Admitting: Cardiology

## 2016-02-19 DIAGNOSIS — I739 Peripheral vascular disease, unspecified: Secondary | ICD-10-CM

## 2016-02-27 ENCOUNTER — Ambulatory Visit (INDEPENDENT_AMBULATORY_CARE_PROVIDER_SITE_OTHER): Payer: Medicare Other | Admitting: Cardiovascular Disease

## 2016-02-27 ENCOUNTER — Encounter: Payer: Self-pay | Admitting: Cardiovascular Disease

## 2016-02-27 VITALS — BP 128/84 | HR 74 | Ht 75.0 in | Wt 176.8 lb

## 2016-02-27 DIAGNOSIS — Z720 Tobacco use: Secondary | ICD-10-CM

## 2016-02-27 DIAGNOSIS — E7849 Other hyperlipidemia: Secondary | ICD-10-CM

## 2016-02-27 DIAGNOSIS — E784 Other hyperlipidemia: Secondary | ICD-10-CM

## 2016-02-27 DIAGNOSIS — I739 Peripheral vascular disease, unspecified: Secondary | ICD-10-CM | POA: Diagnosis not present

## 2016-02-27 MED ORDER — VARENICLINE TARTRATE 0.5 MG X 11 & 1 MG X 42 PO MISC: ORAL | 0 refills | Status: DC

## 2016-02-27 MED ORDER — VARENICLINE TARTRATE 1 MG PO TABS: 1.0000 mg | ORAL_TABLET | Freq: Two times a day (BID) | ORAL | 2 refills | Status: DC

## 2016-02-27 NOTE — Progress Notes (Signed)
Cardiology Office Note   Date:  02/27/2016   ID:  David David, David David 1950-04-14, MRN [id number]  PCP:  David David  Cardiologist:   Kathlyn Sacramento, MD   Chief Complaint  Patient presents with  . Follow-up    PVD      History of Present Illness: David David is a 65 y.o. male who presents for a follow-up visit regarding peripheral arterial disease. The patient has lower extremity peripheral arterial disease with recurrent atheroembolic events to the left foot due to distal left popliteal artery stenosis with previous Cypher drug-eluting stent placement which was used in order to treat an ulcerated plaque with distal embolization.  He was  seen in 11/2012  for a painless rash involving the left great toe and medial foot, with typical appearance of an athero embolic event. Echocardiogram was unremarkable.  CTA of the chest abdomen and pelvis with runoff to the feet demonstrated scattered atheromatous the aorta but no significant findings.  The left tibioperoneal stent had greater than 70% stenosis. ABI was normal but there was significant dampening in pressure noted in the big toe. I proceeded with angiography which confirmed this and also showed an occluded dorsalis pedis likely from embolization. I performed balloon angioplasty with an Angioscore balloon to both ostial anterior tibial and ostial tibioperoneal trunk.  He had cardiac catheterization in May of 2016 which showed moderate left circumflex stenosis and no evidence of obstructive disease.  He continues to smoke one pack per day but is interested in smoking cessation. He denies any claudication or lower extremity ulceration. No chest pain or shortness of breath. He does complain of bilateral knee pain.   Past Medical History:  Diagnosis Date  . Alcoholism in family   . Anxiety   . Depressive disorder, not elsewhere classified   . Diverticulosis of colon (without mention of hemorrhage)   . Dysfunction of  eustachian tube   . Exposure to STD   . Family history of diabetes mellitus   . Family history of psychiatric condition    family Hx of suicide   . GERD (gastroesophageal reflux disease)   . Impotence of organic origin    erectile dysfunction  . Lipoprotein deficiencies   . Other diseases of lung, not elsewhere classified    pulmonary nodule  . Peripheral vascular disease, unspecified    lower extremity peripheral artery disease  . Personal history of colonic polyps   . Routine general medical examination at a health care facility   . Sprain of neck    Hx  . Thyroid disease   . Tobacco use disorder   . Unspecified hypertensive kidney disease with chronic kidney disease stage I through stage IV, or unspecified(403.90)    cholesterol embolization syndrome    Past Surgical History:  Procedure Laterality Date  . angiography     bilateral lower extremity angiography, stenting of the left.   Marland Kitchen CARDIAC CATHETERIZATION N/A 07/19/2014   Procedure: Left Heart Cath and Coronary Angiography;  Surgeon: Leonie Man, MD;  Location: [facility] INVASIVE CV LAB CUPID;  Service: Cardiovascular;  Laterality: N/A;  . EYE SURGERY Right   . LOWER EXTREMITY ANGIOGRAM N/A 11/04/2012   Procedure: LOWER EXTREMITY ANGIOGRAM;  Surgeon: Wellington Hampshire, MD;  Location: [facility] CATH LAB;  Service: Cardiovascular;  Laterality: N/A;  . POPLITEAL ARTERY ANGIOPLASTY     didnt specify angioplasty or stent  . SPERMATOCELECTOMY     left     Current Outpatient Prescriptions  Medication Sig Dispense Refill  . amLODipine (NORVASC) 10 MG tablet Take 5 mg by mouth daily.      Marland Kitchen aspirin EC 81 MG tablet Take 1 tablet (81 mg total) by mouth daily. 90 tablet 3  . cholecalciferol (VITAMIN D) 1000 UNITS tablet Take 1,000 Units by mouth daily.    . clopidogrel (PLAVIX) 75 MG tablet Take 1 tablet (75 mg total) by mouth daily. (Patient taking differently: Take 75 mg by mouth at bedtime. ) 30 tablet 3  . cyclobenzaprine (FLEXERIL) 10  MG tablet Take 1 tablet (10 mg total) by mouth at bedtime. 30 tablet   . escitalopram (LEXAPRO) 10 MG tablet Take 1 tablet (10 mg total) by mouth 3 (three) times daily.    . finasteride (PROSCAR) 5 MG tablet Take 5 mg by mouth at bedtime. Reported on 06/08/5571    . folic acid (FOLVITE) 1 MG tablet Take 1 mg by mouth daily.     Marland Kitchen gabapentin (NEURONTIN) 300 MG capsule Take 600 mg by mouth 2 (two) times daily.    . hydrOXYzine (ATARAX) 50 MG tablet Take 50 mg by mouth 2 (two) times daily.     Marland Kitchen levothyroxine (SYNTHROID, LEVOTHROID) 75 MCG tablet Take 1 tablet (75 mcg total) by mouth daily before breakfast.    . mirtazapine (REMERON) 15 MG tablet Take 15 mg by mouth at bedtime.      . multivitamin (THERAGRAN) per tablet Take 1 tablet by mouth daily.      Marland Kitchen oxybutynin (DITROPAN) 5 MG tablet Take 5 mg by mouth at bedtime.    . pantoprazole (PROTONIX) 40 MG tablet Take 40 mg by mouth daily.      Marland Kitchen PRAVASTATIN SODIUM PO Take 40 mg by mouth daily.     . prazosin (MINIPRESS) 2 MG capsule Take 2 capsules (4 mg total) by mouth at bedtime.    . Thiamine HCl (VITAMIN B-1) 250 MG tablet Take 250 mg by mouth daily.      . vitamin E 600 UNIT capsule Take 600 Units by mouth daily.       No current facility-administered medications for this visit.     Allergies:   Neomycin-bacitracin zn-polymyx; Niacin; and Paroxetine    Social History:  The patient  reports that he has been smoking Cigarettes.  He has never used smokeless tobacco. He reports that he drinks alcohol. He reports that he uses drugs, including Marijuana.   Family History:  The patient's family history includes Alzheimer's disease in his brother and sister; CAD in his father; Cancer in his brother; Congestive Heart Failure in his mother; Diabetes in his brother.    ROS:  Please see the history of present illness.   Otherwise, review of systems are positive for none.   All other systems are reviewed and negative.    PHYSICAL EXAM: VS:  BP  128/84   Pulse 74   Ht '6\' 3"'$  (1.905 m)   Wt 176 lb 12.8 oz (80.2 kg)   BMI 22.10 kg/m  , BMI Body mass index is 22.1 kg/m. GEN: Well nourished, well developed, in no acute distress  HEENT: normal  Neck: no JVD, carotid bruits, or masses Cardiac: RRR; no murmurs, rubs, or gallops,no edema  Respiratory:  clear to auscultation bilaterally, normal work of breathing GI: soft, nontender, nondistended, + BS MS: no deformity or atrophy  Skin: warm and dry, no rash Neuro:  Strength and sensation are intact Psych: euthymic mood, full affect   EKG:  EKG  is ordered today. The ekg ordered today demonstrates normal sinus rhythm with nonspecific T wave changes.   Recent Labs: 05/15/2015: ALT 21; BUN 13; Creat 1.02; Hemoglobin 13.7; Potassium 4.3; Sodium 141; TSH 1.60    Lipid Panel    Component Value Date/Time   CHOL  12/30/2009 0212    173        ATP III CLASSIFICATION:  <200     mg/dL   Desirable  200-239  mg/dL   Borderline High  >=240    mg/dL   High          TRIG 297 (H) 12/30/2009 0212   HDL 29 (L) 12/30/2009 0212   CHOLHDL 6.0 12/30/2009 0212   VLDL 59 (H) 12/30/2009 0212   LDLCALC  12/30/2009 0212    85        Total Cholesterol/HDL:CHD Risk Coronary Heart Disease Risk Table                     Men   Women  1/2 Average Risk   3.4   3.3  Average Risk       5.0   4.4  2 X Average Risk   9.6   7.1  3 X Average Risk  23.4   11.0        Use the calculated Patient Ratio above and the CHD Risk Table to determine the patient's CHD Risk.        ATP III CLASSIFICATION (LDL):  <100     mg/dL   Optimal  100-129  mg/dL   Near or Above                    Optimal  130-159  mg/dL   Borderline  160-189  mg/dL   High  >190     mg/dL   Very High      Wt Readings from Last 3 Encounters:  02/27/16 176 lb 12.8 oz (80.2 kg)  12/06/15 169 lb 3 oz (76.7 kg)  11/12/15 173 lb 6.4 oz (78.7 kg)      No flowsheet data found.    ASSESSMENT AND PLAN:  1.  Peripheral arterial  disease: The patient has no claudication or lower extremity ulceration. I reviewed his most recent noninvasive vascular studies from last week which showed normal ABI was no evidence of significant stenosis. Continue medical therapy and dual antiplatelet therapy. Repeat lower extremity arterial duplex in 1 year.  2. Tobacco use: He is interested in smoking cessation. I prescribed Chantix and gave him instructions.  3. Essential hypertension: Blood pressure is well controlled on current medications.  4. Hyperlipidemia: Continue treatment with pravastatin with a target LDL of less than 70.    Disposition:   FU with me in 1 year  Signed,  Kathlyn Sacramento, MD  02/27/2016 9:38 AM    [place]

## 2016-02-27 NOTE — Patient Instructions (Signed)
Medication Instructions:  Your physician has recommended you make the following change in your medication:  1. START Chantix one month starter pack and then you will take maintenance dosage for 2 more months  Labwork: No new orders.   Testing/Procedures: Your physician has requested that you have a lower extremity arterial duplex in 1 YEAR. This test is an ultrasound of the arteries in the legs. It looks at arterial blood flow in the legs. Allow one hour for Lower Arterial scans. There are no restrictions or special instructions  Follow-Up: Your physician wants you to follow-up in: 1 YEAR with Dr Fletcher Anon.  You will receive a reminder letter in the mail two months in advance. If you don't receive a letter, please call our office to schedule the follow-up appointment.   Any Other Special Instructions Will Be Listed Below (If Applicable).     If you need a refill on your cardiac medications before your next appointment, please call your pharmacy.

## 2016-05-17 LAB — BASIC METABOLIC PANEL
BUN: 13 (ref 4–21)
Creatinine: 1 (ref 0.6–1.3)
GLUCOSE: 124
POTASSIUM: 4.2 (ref 3.4–5.3)
SODIUM: 142 (ref 137–147)

## 2016-05-17 LAB — CBC AND DIFFERENTIAL
HEMATOCRIT: 42 (ref 41–53)
HEMOGLOBIN: 14 (ref 13.5–17.5)
Platelets: 169 (ref 150–399)
WBC: 9.6

## 2016-05-17 LAB — HEPATIC FUNCTION PANEL
ALK PHOS: 134 — AB (ref 25–125)
ALT: 20 (ref 10–40)
AST: 24 (ref 14–40)
Bilirubin, Total: 0.2

## 2016-06-30 NOTE — Progress Notes (Addendum)
HPI:   David David is a 66 y.o. male, who is here today to establish care.  Former PCP: Dr Linna Darner at the [facility] Last preventive routine visit: He is not sure.  Chronic medical problems: HTN,PAD (S/P LLE stent of distal left popliteal artery),HLD, memory difficulties (followed with Dr Delice Lesch 11/2016, [place] 30/30), and depression among some.  He follows with Dr Fletcher Anon for PVD, last seen 02/2016.  Hypertension:   Dx a few years ago. Currently on Amlodipine 5 mg daily.   No side effects reported.  He has not noted unusual headache, visual changes, exertional chest pain, dyspnea,  focal weakness, or edema.   Lab Results  Component Value Date   CREATININE 1.02 05/15/2015   BUN 13 05/15/2015   NA 141 05/15/2015   K 4.3 05/15/2015   CL 108 05/15/2015   CO2 25 05/15/2015    Hyperlipidemia:  Currently on Pravastatin 40 mg daily. Following a low fat diet: yes.  He has not noted side effects with medication.  Lab Results  Component Value Date   CHOL  12/30/2009    173        ATP III CLASSIFICATION:  <200     mg/dL   Desirable  200-239  mg/dL   Borderline High  >=240    mg/dL   High          HDL 29 (L) 12/30/2009   LDLCALC  12/30/2009    85        Total Cholesterol/HDL:CHD Risk Coronary Heart Disease Risk Table                     Men   Women  1/2 Average Risk   3.4   3.3  Average Risk       5.0   4.4  2 X Average Risk   9.6   7.1  3 X Average Risk  23.4   11.0        Use the calculated Patient Ratio above and the CHD Risk Table to determine the patient's CHD Risk.        ATP III CLASSIFICATION (LDL):  <100     mg/dL   Optimal  100-129  mg/dL   Near or Above                    Optimal  130-159  mg/dL   Borderline  160-189  mg/dL   High  >190     mg/dL   Very High   TRIG 297 (H) 12/30/2009   CHOLHDL 6.0 12/30/2009   Hypothyroidism:  He just had thyroid u/s done because nodules reported on chest CT, has not heard about results yet. Currently he  is on Levothyroxine 75 mcg daily.. Tolerating medication well, no side effects reported.   He has not noted dysphagia, palpitations, abdominal pain, changes in bowel habits, tremor, cold/heat intolerance. + Abnormal weight loss and fatigue.  Lab Results  Component Value Date   TSH 1.60 05/15/2015     Hx of depression, currently he is on Remeron 15 mg ,Lexapro 10 mg. Dx 1998. He follows with Dr Truett Perna, psychiatrist, q 3-4 months.   He lives with daughter. Independent ADL's and IADL's.   Concerns today: He has several concerns today,neither one is new and has seen by different providers at the [place] to address them.He tells me that "nobody knows" why he is having some of these symptoms; has been told "nothing is wrong"  but he is still having symptoms. He states that he is not satisfied with answers that have been provided.  Wt loss,right breast enlarged and tender, pain and enlarged,pruritic and painful rash,"knot" on neck for a year,back pain, arthralgias,rhinorrhea,productive cough,and post nasal drainage among some.  -Lesion noted a while ago on cervical area, no tender, he thinks it is growing. No Hx of trauma.  Rash for 8-9 months or erythematous , pruritic rash on LE bilateral and upper chest mainly, has new lesions appearing. Rash is also painful, causes irritation after scratching. Last year he had a tick bite and according to pt,labs were fine. No associated fever,chills, joint edema or erythema. + Myalgias, joint pain, and fatigue.  Concerned because he has Hx of tick bite a few months ago before rash started.  He has seen dermatologists but according to,pt etiology has not yet determined.  Has tried Lidocaine, Hydrocortisone 2.5 %, and Triamcinolone. OTC anti itch cream.   Arthralgias: For about 4 years. Mainly hand joints bilateral and lately worse in MCP joint of thumbs. He follows with  rheuma and according to pt, he was Dx with OA of left thumb but he disagrees with  this diagnosis. Splint was recommended but does not help. Problem started on right hand.  Getting worse, thumb gets "lucked". He thinks he has carpal tunnel synd. Numbness and tingling on fingers.  He has followed with rheuma. Right handed.  Lower back pain, exacerbated by prolonged walking,standing,and when getting up after prolonged sitting. He denies saddle anesthesia or changes in urine/bowel incontinence.  -Rhinorrhea and cough:  For a year or so he has had intermittent rhinorrhea and productive cough,worse in the morning. He has not identified exacerbating or alleviating factors. Denies hemoptysis. Cough and wheezing,mainly at night. He uses Albuterol inh as needed and it helps. + Smoker.  Hx of COPD.  + Itching eyes,nose, and throat.  He tried Claritin but did not help much.  He has not noted fever or chills, no sick contact,or recent travel.  wt loss About 40 lb in a year. He states that he tries not to skip meals, eats 2-3 times per day. He denies decreased appetite but still he continues loosing wt. He reports all recommended screening test for his age up to date,including colonoscopy and prostate.  He report hx of "sarcoma" removed from right thigh, he follows with oncologists.    Review of Systems  Constitutional: Positive for activity change, fatigue and unexpected weight change. Negative for appetite change and fever.  HENT: Positive for congestion, postnasal drip and rhinorrhea. Negative for facial swelling, mouth sores, nosebleeds, sore throat and trouble swallowing.   Eyes: Positive for itching. Negative for redness and visual disturbance.  Respiratory: Positive for cough and wheezing. Negative for shortness of breath.   Cardiovascular: Negative for chest pain, palpitations and leg swelling.  Gastrointestinal: Negative for abdominal pain, blood in stool, nausea and vomiting.       No changes in bowel habits.  Endocrine: Negative for cold intolerance, heat  intolerance, polydipsia, polyphagia and polyuria.  Genitourinary: Negative for decreased urine volume, dysuria and hematuria.  Musculoskeletal: Positive for arthralgias, back pain and myalgias. Negative for joint swelling.  Skin: Positive for rash. Negative for wound.  Allergic/Immunologic: Positive for environmental allergies.  Neurological: Negative for seizures, syncope, weakness and headaches.  Hematological: Negative for adenopathy. Does not bruise/bleed easily.  Psychiatric/Behavioral: Negative for confusion. The patient is nervous/anxious.       Current Outpatient Prescriptions on File Prior to Visit  Medication Sig Dispense Refill  . amLODipine (NORVASC) 10 MG tablet Take 5 mg by mouth daily.      Marland Kitchen aspirin EC 81 MG tablet Take 1 tablet (81 mg total) by mouth daily. 90 tablet 3  . cholecalciferol (VITAMIN D) 1000 UNITS tablet Take 1,000 Units by mouth daily.    . clopidogrel (PLAVIX) 75 MG tablet Take 1 tablet (75 mg total) by mouth daily. (Patient taking differently: Take 75 mg by mouth at bedtime. ) 30 tablet 3  . cyclobenzaprine (FLEXERIL) 10 MG tablet Take 1 tablet (10 mg total) by mouth at bedtime. 30 tablet   . escitalopram (LEXAPRO) 10 MG tablet Take 1 tablet (10 mg total) by mouth 3 (three) times daily.    . finasteride (PROSCAR) 5 MG tablet Take 5 mg by mouth at bedtime. Reported on 09/05/3084    . folic acid (FOLVITE) 1 MG tablet Take 1 mg by mouth daily.     Marland Kitchen gabapentin (NEURONTIN) 300 MG capsule Take 600 mg by mouth 2 (two) times daily.    . hydrOXYzine (ATARAX) 50 MG tablet Take 50 mg by mouth 2 (two) times daily.     Marland Kitchen levothyroxine (SYNTHROID, LEVOTHROID) 75 MCG tablet Take 1 tablet (75 mcg total) by mouth daily before breakfast.    . mirtazapine (REMERON) 15 MG tablet Take 15 mg by mouth at bedtime.      . multivitamin (THERAGRAN) per tablet Take 1 tablet by mouth daily.      Marland Kitchen oxybutynin (DITROPAN) 5 MG tablet Take 5 mg by mouth at bedtime.    . pantoprazole  (PROTONIX) 40 MG tablet Take 40 mg by mouth daily.      Marland Kitchen PRAVASTATIN SODIUM PO Take 40 mg by mouth daily.     . prazosin (MINIPRESS) 2 MG capsule Take 2 capsules (4 mg total) by mouth at bedtime.    . Thiamine HCl (VITAMIN B-1) 250 MG tablet Take 250 mg by mouth daily.      . varenicline (CHANTIX CONTINUING MONTH PAK) 1 MG tablet Take 1 tablet (1 mg total) by mouth 2 (two) times daily. 60 tablet 2  . varenicline (CHANTIX STARTING MONTH PAK) 0.5 MG X 11 & 1 MG X 42 tablet Take one 0.5 mg tablet by mouth once daily for 3 days, then increase to one 0.5 mg tablet twice daily for 4 days, then increase to one 1 mg tablet twice daily. 53 tablet 0  . vitamin E 600 UNIT capsule Take 600 Units by mouth daily.       No current facility-administered medications on file prior to visit.      Past Medical History:  Diagnosis Date  . Alcoholism in family   . Anxiety   . Depressive disorder, not elsewhere classified   . Diverticulosis of colon (without mention of hemorrhage)   . Dysfunction of eustachian tube   . Exposure to STD   . Family history of diabetes mellitus   . Family history of psychiatric condition    family Hx of suicide   . GERD (gastroesophageal reflux disease)   . Impotence of organic origin    erectile dysfunction  . Lipoprotein deficiencies   . Other diseases of lung, not elsewhere classified    pulmonary nodule  . Peripheral vascular disease, unspecified ([place])    lower extremity peripheral artery disease  . Personal history of colonic polyps   . Routine general medical examination at a health care facility   . Sprain of neck  Hx  . Thyroid disease   . Tobacco use disorder   . Unspecified hypertensive kidney disease with chronic kidney disease stage I through stage IV, or unspecified(403.90)    cholesterol embolization syndrome   Allergies  Allergen Reactions  . Neomycin-Bacitracin Zn-Polymyx     unknown  . Niacin     unknown  . Paroxetine     REACTION: goofy     Family History  Problem Relation Age of Onset  . CAD Father   . Congestive Heart Failure Mother   . Cancer Brother   . Alzheimer's disease Sister   . Alzheimer's disease Brother   . Diabetes Brother     Social History   Social History  . Marital status: Single    Spouse name: N/A  . Number of children: 1  . Years of education: N/A   Occupational History  . Doesn't work    Social History Main Topics  . Smoking status: Current Every Day Smoker    Types: Cigarettes  . Smokeless tobacco: Never Used     Comment: QUIT 2009  . Alcohol use 0.0 oz/week     Comment:    . Drug use: Yes    Types: Marijuana  . Sexual activity: Not Asked   Other Topics Concern  . None   Social History Narrative   No diet, not restricting cholesterol.    Vitals:   07/01/16 0856  BP: 124/80  Pulse: 67  Resp: 12  O2 sat at RA 93% Body mass index is 20.05 kg/m.   Physical Exam  Nursing note and vitals reviewed. Constitutional: He is oriented to person, place, and time. He appears well-developed and well-nourished. No distress.  HENT:  Head: Atraumatic.  Mouth/Throat: Oropharynx is clear and moist and mucous membranes are normal.  Eyes: Conjunctivae and EOM are normal. Pupils are equal, round, and reactive to light.  Neck: No tracheal deviation present. No thyroid mass and no thyromegaly present.    Right paraspinal area,small nodular lesion, 1.5-2 cm, some borders are define (right side), it seems mobile. It disappear  with extension and more prominent with flexion.   Cardiovascular: Normal rate and regular rhythm.   No murmur heard. Pulses:      Dorsalis pedis pulses are 2+ on the right side, and 2+ on the left side.  Respiratory: Effort normal and breath sounds normal. No respiratory distress.  GI: Soft. He exhibits no mass. There is no hepatomegaly. There is no tenderness.  Musculoskeletal: He exhibits no edema.  Left hand:Tenderness upon palpation of radial styloid, no edema  or erythema appreciated.  Pain also elicited on radial styloid with Finkelstein maneuver. Muscle atrophy. Thumb limitation active abduction.  Tinel and phalen + left.     Lymphadenopathy:    He has no cervical adenopathy.  Neurological: He is alert and oriented to person, place, and time. He has normal strength. He displays atrophy. No cranial nerve deficit. Coordination and gait normal.  Skin: Skin is warm. Rash noted. Rash is macular. Rash is not vesicular. No erythema.     Scaly-macular rounded rash,no tender or indurated on lower extremities and a few on chest.  Psychiatric: His mood appears anxious. His affect is blunt.  Well groomed, good eye contact.      ASSESSMENT AND PLAN:   New[place] was seen today for establish care.  Diagnoses and all orders for this visit:  Weight loss, abnormal  Recommend 3 meals daily with protein snacks in between. Possible etiologies discussed. Smoking cessation  may help with wt gain. I did not find information about "sarcoma" We will continue monitoring. Further recommendations will be given according to lab results. F/U in 4 weeks.   -     CBC with Differential/Platelet -     VITAMIN D 25 Hydroxy (Vit-D Deficiency, Fractures) -     Basic metabolic panel  Essential hypertension  Adequately controlled. No changes in current management. DASH-low diet recommended. Eye exam annually. F/U in 6 months, before if needed.   Hypothyroidism, unspecified type  No changes in current management, will follow labs done today and will give further recommendations accordingly. F/U in 6-12 months.  -     TSH -     T4, free  Pruritic erythematous rash  Myalgias and muscle atrophy ? dermatomyositis. Bx may be necessary to establish Dx if not determined yet. He will try Lotrisone cream. Appt with dermatology will be arranged.  -     Ambulatory referral to Dermatology -     clotrimazole-betamethasone (LOTRISONE) cream; Apply 1 application  topically 2 (two) times daily. -     Lyme Ab/Western Blot Reflex  Tick bite, subsequent encounter -     Lyme Ab/Western Blot Reflex  Lesion of subcutaneous tissue  ? Lipoma. It is small and eventually it may need to be removed. Examination today does not suggest serious process. We will continue monitoring.  Other hyperlipidemia  No changes in current management, will follow labs done today and will give further recommendations accordingly.  De Quervain's disease (radial styloid tenosynovitis)  Problem seems to be chronic. Other possible causes dicussed.  -     Ambulatory referral to Orthopedic Surgery  Chronic obstructive pulmonary disease, unspecified COPD type ([place])  Encouraged smoking cessation. Continue Albuterol inh as needed. He agrees with try Symbicort bid. F/U in 4 weeks.  -     budesonide-formoterol (SYMBICORT) 80-4.5 MCG/ACT inhaler; Inhale 2 puffs into the lungs 2 (two) times daily.  Numbness and tingling in left hand  ? Carpal tunnel synd. EMG will be arranged. Instructed about warning signs.   -     TSH -     NCV with EMG(electromyography); Future   OV face to face 55 min. > 50% of time was dedicated to discussion of possible Dx,treatment options, and prognosis. Most symptoms seems to be chronic. Education about adverse effects of smoking, medications side effects, fall prevention, and coordination of care. We could not cover all his concerns,so he will follow in 4 weeks.    Records review:  This information was base on documentation left with me after OV and reviewed after visit was completed.  Excision right lateral SCC 03/14/16 (Dr Dara Lords) 10/11/15 CT thorax w/o contrast: Surveillance of lung nodule: 12 mm indeterminate Right thyroid nodule right lower pole with associated calcifications. Possible noncalcified pulmonary nodules. Pulmonary emphysema.  6 months follow-up was recommended to follow-up on pulmonary nodules and thyroid ultrasound  to evaluate thyroid nodule. According to patent, results are pending.  LE arterial Doppler was ordered by Dr Delice Lesch 12/06/15, no report found.  Dx mammogram 07/12/2015 normal. Dx with gynecomastia.  06/07/15 treated for PMR with Methyl prednisone,Methotrexate was d/c (summery of rheuma visit)     Advait Buice G. [name], MD  [facility]. [place] office.

## 2016-07-01 ENCOUNTER — Encounter: Payer: Self-pay | Admitting: Family Medicine

## 2016-07-01 ENCOUNTER — Ambulatory Visit (INDEPENDENT_AMBULATORY_CARE_PROVIDER_SITE_OTHER): Payer: Medicare Other | Admitting: Family Medicine

## 2016-07-01 VITALS — BP 124/80 | HR 67 | Resp 12 | Ht 75.0 in | Wt 160.4 lb

## 2016-07-01 DIAGNOSIS — E039 Hypothyroidism, unspecified: Secondary | ICD-10-CM | POA: Diagnosis not present

## 2016-07-01 DIAGNOSIS — I739 Peripheral vascular disease, unspecified: Secondary | ICD-10-CM | POA: Diagnosis not present

## 2016-07-01 DIAGNOSIS — E784 Other hyperlipidemia: Secondary | ICD-10-CM

## 2016-07-01 DIAGNOSIS — I10 Essential (primary) hypertension: Secondary | ICD-10-CM

## 2016-07-01 DIAGNOSIS — W57XXXD Bitten or stung by nonvenomous insect and other nonvenomous arthropods, subsequent encounter: Secondary | ICD-10-CM

## 2016-07-01 DIAGNOSIS — L989 Disorder of the skin and subcutaneous tissue, unspecified: Secondary | ICD-10-CM

## 2016-07-01 DIAGNOSIS — R634 Abnormal weight loss: Secondary | ICD-10-CM | POA: Diagnosis not present

## 2016-07-01 DIAGNOSIS — L298 Other pruritus: Secondary | ICD-10-CM | POA: Diagnosis not present

## 2016-07-01 DIAGNOSIS — M654 Radial styloid tenosynovitis [de Quervain]: Secondary | ICD-10-CM

## 2016-07-01 DIAGNOSIS — J449 Chronic obstructive pulmonary disease, unspecified: Secondary | ICD-10-CM | POA: Diagnosis not present

## 2016-07-01 DIAGNOSIS — R200 Anesthesia of skin: Secondary | ICD-10-CM | POA: Diagnosis not present

## 2016-07-01 DIAGNOSIS — E7849 Other hyperlipidemia: Secondary | ICD-10-CM

## 2016-07-01 DIAGNOSIS — R202 Paresthesia of skin: Secondary | ICD-10-CM | POA: Diagnosis not present

## 2016-07-01 LAB — CBC WITH DIFFERENTIAL/PLATELET
Basophils Absolute: 0 10*3/uL (ref 0.0–0.1)
Basophils Relative: 0.3 % (ref 0.0–3.0)
EOS PCT: 0.2 % (ref 0.0–5.0)
Eosinophils Absolute: 0 10*3/uL (ref 0.0–0.7)
HEMATOCRIT: 45.7 % (ref 39.0–52.0)
Hemoglobin: 14.7 g/dL (ref 13.0–17.0)
LYMPHS ABS: 1.9 10*3/uL (ref 0.7–4.0)
LYMPHS PCT: 25.1 % (ref 12.0–46.0)
MCHC: 32.2 g/dL (ref 30.0–36.0)
MCV: 94.7 fl (ref 78.0–100.0)
MONOS PCT: 6.6 % (ref 3.0–12.0)
Monocytes Absolute: 0.5 10*3/uL (ref 0.1–1.0)
NEUTROS ABS: 5 10*3/uL (ref 1.4–7.7)
NEUTROS PCT: 67.8 % (ref 43.0–77.0)
PLATELETS: 156 10*3/uL (ref 150.0–400.0)
RBC: 4.82 Mil/uL (ref 4.22–5.81)
RDW: 15.1 % (ref 11.5–15.5)
WBC: 7.4 10*3/uL (ref 4.0–10.5)

## 2016-07-01 LAB — BASIC METABOLIC PANEL
BUN: 13 mg/dL (ref 6–23)
CO2: 27 meq/L (ref 19–32)
Calcium: 9.3 mg/dL (ref 8.4–10.5)
Chloride: 108 mEq/L (ref 96–112)
Creatinine, Ser: 0.88 mg/dL (ref 0.40–1.50)
GFR: 91.7 mL/min (ref >60.00)
GLUCOSE: 96 mg/dL (ref 70–99)
Potassium: 4.5 mEq/L (ref 3.5–5.1)
Sodium: 140 mEq/L (ref 135–145)

## 2016-07-01 LAB — VITAMIN D 25 HYDROXY (VIT D DEFICIENCY, FRACTURES): VITD: 51.95 ng/mL (ref 30.00–100.00)

## 2016-07-01 LAB — T4, FREE: FREE T4: 1.15 ng/dL (ref 0.60–1.60)

## 2016-07-01 LAB — TSH: TSH: 2.07 u[IU]/mL (ref 0.35–4.50)

## 2016-07-01 MED ORDER — BUDESONIDE-FORMOTEROL FUMARATE 80-4.5 MCG/ACT IN AERO
2.0000 | INHALATION_SPRAY | Freq: Two times a day (BID) | RESPIRATORY_TRACT | 3 refills | Status: DC
Start: 2016-07-01 — End: 2016-07-29

## 2016-07-01 MED ORDER — CLOTRIMAZOLE-BETAMETHASONE 1-0.05 % EX CREA: 1.0000 "application " | TOPICAL_CREAM | Freq: Two times a day (BID) | CUTANEOUS | 1 refills | Status: DC

## 2016-07-01 NOTE — Patient Instructions (Addendum)
A few things to remember from today's visit:   PERIPHERAL VASCULAR DISEASE  Essential hypertension  Hypothyroidism, unspecified type - Plan: TSH, T4, free  Other hyperlipidemia  Pruritic erythematous rash - Plan: Ambulatory referral to Dermatology, Lyme Ab/Western Blot Reflex  Tick bite, sequela - Plan: Lyme Ab/Western Blot Reflex  Weight loss, abnormal - Plan: CBC with Differential/Platelet, VITAMIN D 25 Hydroxy (Vit-D Deficiency, Fractures), Basic metabolic panel  De Quervain's disease (radial styloid tenosynovitis) - Plan: Ambulatory referral to Orthopedic Surgery  Numbness and tingling in left hand - Plan: NCV with EMG(electromyography)  We will address some of your other concerns next OV, up to 3 per vitis + will follow on cough and wheezing.   Please be sure medication list is accurate. If a new problem present, please set up appointment sooner than planned today.

## 2016-07-01 NOTE — Progress Notes (Signed)
Pre visit review using our clinic review tool, if applicable. No additional management support is needed unless otherwise documented below in the visit note. 

## 2016-07-02 ENCOUNTER — Encounter: Payer: Self-pay | Admitting: Family Medicine

## 2016-07-02 LAB — LYME AB/WESTERN BLOT REFLEX: B burgdorferi Ab IgG+IgM: <0.9 Index (ref <0.90)

## 2016-07-03 ENCOUNTER — Telehealth: Payer: Self-pay

## 2016-07-03 NOTE — Telephone Encounter (Signed)
Called and left voicemail for patient letting him know that his medical record that he brought to his appointment is ready to be picked up & that it is up front.

## 2016-07-04 ENCOUNTER — Ambulatory Visit (INDEPENDENT_AMBULATORY_CARE_PROVIDER_SITE_OTHER): Payer: Medicare Other

## 2016-07-04 ENCOUNTER — Ambulatory Visit (INDEPENDENT_AMBULATORY_CARE_PROVIDER_SITE_OTHER): Payer: Medicare Other | Admitting: Orthopaedic Surgery

## 2016-07-04 ENCOUNTER — Encounter (INDEPENDENT_AMBULATORY_CARE_PROVIDER_SITE_OTHER): Payer: Self-pay | Admitting: Orthopaedic Surgery

## 2016-07-04 DIAGNOSIS — M1812 Unilateral primary osteoarthritis of first carpometacarpal joint, left hand: Secondary | ICD-10-CM | POA: Diagnosis not present

## 2016-07-04 MED ORDER — METHYLPREDNISOLONE ACETATE 40 MG/ML IJ SUSP
13.3300 mg | INTRAMUSCULAR | Status: AC | PRN
  Administered 2016-07-04: 13.33 mg

## 2016-07-04 MED ORDER — LIDOCAINE HCL 1 % IJ SOLN
0.3000 mL | INTRAMUSCULAR | Status: AC | PRN
  Administered 2016-07-04: .3 mL

## 2016-07-04 MED ORDER — DICLOFENAC SODIUM 1 % TD GEL: 2.0000 g | Freq: Four times a day (QID) | TRANSDERMAL | 5 refills | Status: DC

## 2016-07-04 MED ORDER — BUPIVACAINE HCL 0.5 % IJ SOLN
0.3300 mL | INTRAMUSCULAR | Status: AC | PRN
  Administered 2016-07-04: .33 mL

## 2016-07-04 MED ORDER — MELOXICAM 7.5 MG PO TABS: 7.5000 mg | ORAL_TABLET | Freq: Two times a day (BID) | ORAL | 2 refills | Status: DC | PRN

## 2016-07-04 NOTE — Progress Notes (Signed)
Office Visit Note   Patient: David David           Date of Birth: 12/29/1950           MRN: [id number] Visit Date: 07/04/2016              Requested by: [name] [name], MD [address] [place], [place] [address] PCP: [name], MD   Assessment & Plan: Visit Diagnoses:  1. Arthritis of carpometacarpal (CMC) joint of left thumb     Plan: Patient has advanced basal joint arthritis of the left thumb. I did give him a prescription for meloxicam and full tear in gel. Cortisone injection was performed today. Patient tolerates well continue to use abduction orthotic as needed. Follow-up with me as needed.  Follow-Up Instructions: Return if symptoms worsen or fail to improve.   Orders:  Orders Placed This Encounter  Procedures  . XR Finger Thumb Left   Meds ordered this encounter  Medications  . diclofenac sodium (VOLTAREN) 1 % GEL    Sig: Apply 2 g topically 4 (four) times daily.    Dispense:  1 Tube    Refill:  5  . meloxicam (MOBIC) 7.5 MG tablet    Sig: Take 1 tablet (7.5 mg total) by mouth 2 (two) times daily as needed for pain.    Dispense:  30 tablet    Refill:  2      Procedures: Hand/UE Inj Date/Time: 07/04/2016 11:11 AM Performed by: Leandrew Koyanagi Authorized by: Leandrew Koyanagi   Consent Given by:  Patient Timeout: prior to procedure the correct patient, procedure, and site was verified   Indications:  Pain Condition: osteoarthritis   Location:  Thumb Thumb joint: CMC joint. Prep: patient was prepped and draped in usual sterile fashion   Needle Size:  25 G Medications:  0.3 mL lidocaine 1 %; 0.33 mL bupivacaine 0.5 %; 13.33 mg methylPREDNISolone acetate 40 MG/ML     Clinical Data: No additional findings.   Subjective: Chief Complaint  Patient presents with  . Left Thumb - Pain    Patient is a very pleasant 66 year old gentleman who comes in with a 1-1/2 year history of left thumb pain that radiates in the thenar region. The pain is  worse with use of the hand. He endorses tingling burning and numbness. He has not had injections. 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 He has worn a thumb CMC abduction orthotic with partial relief. Advil does not help significantly. Denies any constitutional symptoms.    Review of Systems  Constitutional: Negative.   All other systems reviewed and are negative.    Objective: Vital Signs: There were no vitals taken for this visit.  Physical Exam  Constitutional: He is oriented to person, place, and time. He appears well-developed and well-nourished.  HENT:  Head: Normocephalic and atraumatic.  Eyes: Pupils are equal, round, and reactive to light.  Neck: Neck supple.  Pulmonary/Chest: Effort normal.  Abdominal: Soft.  Musculoskeletal: Normal range of motion.  Neurological: He is alert and oriented to person, place, and time.  Skin: Skin is warm.  Psychiatric: He has a normal mood and affect. His behavior is normal. Judgment and thought content normal.  Nursing note and vitals reviewed.   Ortho Exam Left hand exam shows a positive grind test. I cannot appreciate any triggering. Specialty Comments:  No specialty comments available.  Imaging: Xr Finger Thumb Left  Result Date: 07/04/2016 Advanced degenerative joint disease left thumb  Nixon joint    PMFS History: Patient Active Problem List   Diagnosis Date Noted  . Arthritis of carpometacarpal [facility]) joint of left thumb 07/04/2016  . Family history of first degree relative with dementia 03/30/2015  . Neuropathy 03/30/2015  . Memory loss 02/21/2015  . Right upper quadrant abdominal pain 08/09/2014  . Chest pain 07/19/2014  . Essential hypertension 07/19/2014  . Hyperlipidemia 07/19/2014  . Hypothyroidism 07/19/2014  . Malnutrition of moderate degree ([place]) 07/19/2014  . Unstable angina ([place])   . PAD (peripheral artery disease) ([place])   . TOBACCO ABUSE 09/27/2008  . CHOLESTEROL EMBOLIZATION SYNDROME 09/27/2008  .  PERIPHERAL VASCULAR DISEASE 09/27/2008  . GERD 09/27/2008  . DYSFUNCTION, EUSTACHIAN TUBE 10/09/2006  . LOW HDL 07/28/2006  . DEPRESSION 07/28/2006  . PULMONARY NODULE 07/28/2006  . DIVERTICULOSIS, COLON 07/28/2006  . ERECTILE DYSFUNCTION, ORGANIC 07/28/2006  . SPRAIN/STRAIN, NECK 07/28/2006  . COLONIC POLYPS, HX OF 07/28/2006   Past Medical History:  Diagnosis Date  . Alcoholism in family   . Anxiety   . Depressive disorder, not elsewhere classified   . Diverticulosis of colon (without mention of hemorrhage)   . Dysfunction of eustachian tube   . Exposure to STD   . Family history of diabetes mellitus   . Family history of psychiatric condition    family Hx of suicide   . GERD (gastroesophageal reflux disease)   . Impotence of organic origin    erectile dysfunction  . Lipoprotein deficiencies   . Other diseases of lung, not elsewhere classified    pulmonary nodule  . Peripheral vascular disease, unspecified ([place])    lower extremity peripheral artery disease  . Personal history of colonic polyps   . Routine general medical examination at a health care facility   . Sprain of neck    Hx  . Thyroid disease   . Tobacco use disorder   . Unspecified hypertensive kidney disease with chronic kidney disease stage I through stage IV, or unspecified(403.90)    cholesterol embolization syndrome    Family History  Problem Relation Age of Onset  . CAD Father   . Congestive Heart Failure Mother   . Cancer Brother   . Alzheimer's disease Sister   . Alzheimer's disease Brother   . Diabetes Brother     Past Surgical History:  Procedure Laterality Date  . angiography     bilateral lower extremity angiography, stenting of the left.   Marland Kitchen CARDIAC CATHETERIZATION N/A 07/19/2014   Procedure: Left Heart Cath and Coronary Angiography;  Surgeon: Leonie Man, MD;  Location: [facility] INVASIVE CV LAB CUPID;  Service: Cardiovascular;  Laterality: N/A;  . EYE SURGERY Right   . LOWER EXTREMITY  ANGIOGRAM N/A 11/04/2012   Procedure: LOWER EXTREMITY ANGIOGRAM;  Surgeon: Wellington Hampshire, MD;  Location: [place] CATH LAB;  Service: Cardiovascular;  Laterality: N/A;  . POPLITEAL ARTERY ANGIOPLASTY     didnt specify angioplasty or stent  . SPERMATOCELECTOMY     left   Social History   Occupational History  . Doesn't work    Social History Main Topics  . Smoking status: Current Every Day Smoker    Types: Cigarettes  . Smokeless tobacco: Never Used     Comment: QUIT 2009  . Alcohol use 0.0 oz/week     Comment:    . Drug use: Yes    Types: Marijuana  . Sexual activity: Not on file

## 2016-07-19 ENCOUNTER — Telehealth (INDEPENDENT_AMBULATORY_CARE_PROVIDER_SITE_OTHER): Payer: Self-pay

## 2016-07-19 NOTE — Telephone Encounter (Signed)
Would you like to do PA or prescribe something else?

## 2016-07-19 NOTE — Telephone Encounter (Signed)
Try mobic 15 mg daily prn #30

## 2016-07-19 NOTE — Telephone Encounter (Addendum)
Todd with [facility] pharmacy called concerning a prior auth.for Diclofenac or if Rx can be changed. [phone].

## 2016-07-23 MED ORDER — MELOXICAM 15 MG PO TABS: 15.0000 mg | ORAL_TABLET | Freq: Every day | ORAL | 0 refills | Status: DC

## 2016-07-23 NOTE — Telephone Encounter (Signed)
Sent Rx into pharm

## 2016-07-23 NOTE — Addendum Note (Signed)
Addended by: Precious Bard on: 07/23/2016 09:51 AM   Modules accepted: Orders

## 2016-07-28 NOTE — Progress Notes (Addendum)
HPI:   David David is a 66 y.o. male, who is here today with his daughter to follow on recent OV.   He was seen on 07/01/16 to establish care, at which time he had multiple concerns.  He was referred to ortho and dermatologists because some of his concerns were chronic and not improved with treatment.  Rash: 9 months of erythematous,pruritic rash. Referral to dermatologists was arranged, ppointment is pending. Rash improved with topical Lotrisone but new lesions appearing.   COPD: Last OV he was c/o persistent cough and rhinorrhea as well as wheezing. Symbicort 80-4.5 mcg was recommended. He is reporting improvement of symptoms since he quit smoking, about a week ago. He is using Symbicort as needed, about twice since his last office visit. He has not used albuterol inhaler.   Numbness and tingling of left hand, EMG was ordered. She has not received an appointment information. He is reporting improvement of symptoms after he received steroid injection to treat left thumb tenosynovitis.Pain left thumb has also improved greatly. He was evaluated by Dr Erlinda Hong, ortho,on 07/04/16.  Concerns today:   Daughter is reporting elevated PSA last year, 10/2015 and according to pt and his daughter, lab was not repeated nor referral to urologists was recommended. At the time he had PSA done he had symptoms of "enlarged prostate": Back pain, his daughter reports. Nocturia has improved, he denies gross hematuria,urine dribbling, or urgency.  She also states that Mr See is following with oncologist for his "thyroid and prostate", denies hx of  Prostate or thyroid malignancies.  -He is concerned about "gallbladder" issues. He is reporting an episode of RUQ abdominal pain last week while he was working outdoors. He describes pain like cramp,something "pulling apart" , not radiated. He is not sure about exacerbating factors, it was not related to food intake.  Associated with mild nausea,  he denies vomiting. It lasted about 10 minutes, alleviated by "stretching body." His daughter mentions an episode of vomiting about 2 weeks ago, which was related to food intake.At that time he did not have abdominal pain. He has had abdominal ultrasound in the past, May 2016, negative for gallbladder stones.  He denies heartburn or epigastric pain, and no changes in bowel habits.  -He is inquiring about the date of his last colonoscopy, according to daughter he is overdue. He would like a referral to Dr. Henrene Pastor.  Denies blood in stool or melena.  -Last OV he was also concerned about persistent wt loss, reported about 40 lb wt loss in a year. Wt seems to be stable since his last OV but he is still concerned about not being able to gain wt. His daughter also voices concerns in this regard, states that he looks better when he was 15-20 pounds heavier. He wonders if he can try Megace to help with wt gain.  He is taking Remeron 15 mg at bedtime for depression, started also to help with insomnia, he is sleeping much better and he is tolerating medication well.   Review of Systems  Constitutional: Positive for fatigue. Negative for activity change, appetite change, fever and unexpected weight change.  HENT: Negative for nosebleeds, sore throat and trouble swallowing.   Eyes: Negative for redness and visual disturbance.  Respiratory: Negative for cough, shortness of breath and wheezing.   Cardiovascular: Negative for chest pain, palpitations and leg swelling.  Gastrointestinal: Positive for abdominal pain and nausea. Negative for blood in stool and vomiting.  Endocrine: Negative  for cold intolerance and heat intolerance.  Genitourinary: Negative for decreased urine volume, dysuria, frequency, hematuria and urgency.  Skin: Positive for rash. Negative for wound.  Neurological: Negative for syncope, weakness and headaches.  Psychiatric/Behavioral: Negative for confusion, sleep disturbance and suicidal  ideas. The patient is nervous/anxious.       Current Outpatient Prescriptions on File Prior to Visit  Medication Sig Dispense Refill  . amLODipine (NORVASC) 10 MG tablet Take 5 mg by mouth daily.      Marland Kitchen aspirin EC 81 MG tablet Take 1 tablet (81 mg total) by mouth daily. 90 tablet 3  . cholecalciferol (VITAMIN D) 1000 UNITS tablet Take 1,000 Units by mouth daily.    . clopidogrel (PLAVIX) 75 MG tablet Take 1 tablet (75 mg total) by mouth daily. (Patient taking differently: Take 75 mg by mouth at bedtime. ) 30 tablet 3  . cyclobenzaprine (FLEXERIL) 10 MG tablet Take 1 tablet (10 mg total) by mouth at bedtime. 30 tablet   . escitalopram (LEXAPRO) 10 MG tablet Take 1 tablet (10 mg total) by mouth 3 (three) times daily.    . folic acid (FOLVITE) 1 MG tablet Take 1 mg by mouth daily.     Marland Kitchen gabapentin (NEURONTIN) 300 MG capsule Take 600 mg by mouth 2 (two) times daily.    . hydrOXYzine (ATARAX) 50 MG tablet Take 50 mg by mouth 2 (two) times daily.     Marland Kitchen levothyroxine (SYNTHROID, LEVOTHROID) 75 MCG tablet Take 1 tablet (75 mcg total) by mouth daily before breakfast.    . multivitamin (THERAGRAN) per tablet Take 1 tablet by mouth daily.      . pantoprazole (PROTONIX) 40 MG tablet Take 40 mg by mouth daily.      Marland Kitchen PRAVASTATIN SODIUM PO Take 40 mg by mouth daily.     . Thiamine HCl (VITAMIN B-1) 250 MG tablet Take 250 mg by mouth daily.      . vitamin E 600 UNIT capsule Take 600 Units by mouth daily.       No current facility-administered medications on file prior to visit.      Past Medical History:  Diagnosis Date  . Alcoholism in family   . Anxiety   . Depressive disorder, not elsewhere classified   . Diverticulosis of colon (without mention of hemorrhage)   . Dysfunction of eustachian tube   . Exposure to STD   . Family history of diabetes mellitus   . Family history of psychiatric condition    family Hx of suicide   . GERD (gastroesophageal reflux disease)   . Impotence of organic  origin    erectile dysfunction  . Lipoprotein deficiencies   . Other diseases of lung, not elsewhere classified    pulmonary nodule  . Peripheral vascular disease, unspecified ([place])    lower extremity peripheral artery disease  . Personal history of colonic polyps   . Routine general medical examination at a health care facility   . Sprain of neck    Hx  . Thyroid disease   . Tobacco use disorder   . Unspecified hypertensive kidney disease with chronic kidney disease stage I through stage IV, or unspecified(403.90)    cholesterol embolization syndrome   Allergies  Allergen Reactions  . Neomycin-Bacitracin Zn-Polymyx     unknown  . Niacin     unknown  . Paroxetine     REACTION: goofy    Social History   Social History  . Marital status: Single  Spouse name: N/A  . Number of children: 1  . Years of education: N/A   Occupational History  . Doesn't work    Social History Main Topics  . Smoking status: Former Smoker    Types: Cigarettes    Start date: 07/22/2016  . Smokeless tobacco: Never Used     Comment: QUIT 2009  . Alcohol use 0.0 oz/week     Comment:    . Drug use: Yes    Types: Marijuana  . Sexual activity: Not Asked   Other Topics Concern  . None   Social History Narrative   No diet, not restricting cholesterol.    Vitals:   07/29/16 0852  BP: 130/80  Pulse: 78  Resp: 12   Body mass index is 20.15 kg/m. O2 sat at RA 98%. Wt Readings from Last 3 Encounters:  07/29/16 161 lb 4 oz (73.1 kg)  07/01/16 160 lb 6 oz (72.7 kg)  02/27/16 176 lb 12.8 oz (80.2 kg)     Physical Exam  Nursing note and vitals reviewed. Constitutional: He is oriented to person, place, and time. He appears well-developed and well-nourished. No distress.  HENT:  Head: Atraumatic.  Mouth/Throat: Oropharynx is clear and moist and mucous membranes are normal.  Eyes: Conjunctivae and EOM are normal.  Cardiovascular: Normal rate and regular rhythm.   No murmur  heard. Pulses:      Dorsalis pedis pulses are 2+ on the right side, and 2+ on the left side.  Respiratory: Effort normal and breath sounds normal. No respiratory distress.  GI: Soft. He exhibits no mass. There is no hepatomegaly. There is no tenderness.  Musculoskeletal: He exhibits no edema.  Lymphadenopathy:    He has no cervical adenopathy.  Neurological: He is alert and oriented to person, place, and time. Gait normal.  No focal deficit appreciated.  Skin: Skin is warm. Rash noted. No erythema.     4 mm erythematous , regular borders, rounded lesion under right breast.  Psychiatric: His mood appears anxious.  Well groomed, good eye contact.      ASSESSMENT AND PLAN:   Kirk was seen today for follow-up.  Diagnoses and all orders for this visit:  RUQ abdominal pain  Explained pt and his daughter that symptoms are not suggestive of cholelithiasis. They both very concerned about this episode,so RUQ u/s ordered. Other possible etiologies discussed, ? musculoskeletal Instructed about warning signs.  -     US Abdomen Limited RUQ; Future  Chronic obstructive pulmonary disease, unspecified COPD type ([place])  Congratulated because smoking cessation, COPD symptom improved. He can discontinue Symbicort and continue Albuterol inh as needed.  Elevated PSA  Reported by pt and daughter. Asymptomatic. We discussed recommendations in regard to prostate cancer screening. Further recommendations will be given according to lab results.   -     PSA  Pruritic erythematous rash  Improved. Continue Lotrisone cream and pending dermatology appt.  Recurrent major depressive disorder, in partial remission ([place])  He still has depressed mood and some anxiety. We discussed some side effects of Remeron as well as other off label use,including wt gain and increased appetite. Instead adding megace, we could increase dose of Remeron, to which he agrees. Instructed about warning signs. He  is following with psychiatrists.  -     mirtazapine (REMERON) 30 MG tablet; Take 1 tablet (30 mg total) by mouth at bedtime.  Colon cancer screening -     Ambulatory referral to Gastroenterology   Face to face from  9:03 am- 9:45 am. > 50% was dedicated to discussion of Dx, prognosis, and treatment options. Spent extra time in reassuring pt and his daughter in regard to wt,abdominal pain, and prostate concerns. Wt has been stable since his last OV and BMI is appropriate. Educated about adverse effects of wt gain, which could aggravate arthralgias and back pain among others. We reviewed last colonoscopy, he is not sure if he had one through the [place], daughter is certain he needs one.      [name]. [name], MD  [facility]. [place] office.

## 2016-07-29 ENCOUNTER — Encounter: Payer: Self-pay | Admitting: Family Medicine

## 2016-07-29 ENCOUNTER — Ambulatory Visit (INDEPENDENT_AMBULATORY_CARE_PROVIDER_SITE_OTHER): Payer: Medicare Other | Admitting: Family Medicine

## 2016-07-29 VITALS — BP 130/80 | HR 78 | Resp 12 | Ht 75.0 in | Wt 161.2 lb

## 2016-07-29 DIAGNOSIS — R1011 Right upper quadrant pain: Secondary | ICD-10-CM

## 2016-07-29 DIAGNOSIS — R972 Elevated prostate specific antigen [PSA]: Secondary | ICD-10-CM

## 2016-07-29 DIAGNOSIS — F3341 Major depressive disorder, recurrent, in partial remission: Secondary | ICD-10-CM

## 2016-07-29 DIAGNOSIS — J449 Chronic obstructive pulmonary disease, unspecified: Secondary | ICD-10-CM | POA: Diagnosis not present

## 2016-07-29 DIAGNOSIS — L298 Other pruritus: Secondary | ICD-10-CM | POA: Diagnosis not present

## 2016-07-29 DIAGNOSIS — Z1211 Encounter for screening for malignant neoplasm of colon: Secondary | ICD-10-CM | POA: Diagnosis not present

## 2016-07-29 LAB — PSA: PSA: 1.66 ng/mL (ref 0.10–4.00)

## 2016-07-29 MED ORDER — MIRTAZAPINE 30 MG PO TABS: 30.0000 mg | ORAL_TABLET | Freq: Every day | ORAL | 3 refills | Status: DC

## 2016-07-29 NOTE — Patient Instructions (Addendum)
A few things to remember from today's visit:   Chronic obstructive pulmonary disease, unspecified COPD type ([place])  Elevated PSA - Plan: PSA  RUQ abdominal pain - Plan: US Abdomen Limited RUQ  Recurrent major depressive disorder, in partial remission ([place]) - Plan: mirtazapine (REMERON) 30 MG tablet  Remeron increased from 15 to 30 mg. Dermatology appt pending.   Wt Readings from Last 3 Encounters:  07/29/16 161 lb 4 oz (73.1 kg)  07/01/16 160 lb 6 oz (72.7 kg)  02/27/16 176 lb 12.8 oz (80.2 kg)   Be sure to follow with neuro and rheuma also.    Please be sure medication list is accurate. If a new problem present, please set up appointment sooner than planned today.

## 2016-08-19 DIAGNOSIS — H35341 Macular cyst, hole, or pseudohole, right eye: Secondary | ICD-10-CM | POA: Diagnosis not present

## 2016-08-19 DIAGNOSIS — Z961 Presence of intraocular lens: Secondary | ICD-10-CM | POA: Diagnosis not present

## 2016-08-19 DIAGNOSIS — H01113 Allergic dermatitis of right eye, unspecified eyelid: Secondary | ICD-10-CM | POA: Diagnosis not present

## 2016-08-19 DIAGNOSIS — H43822 Vitreomacular adhesion, left eye: Secondary | ICD-10-CM | POA: Diagnosis not present

## 2016-08-20 ENCOUNTER — Encounter: Payer: Self-pay | Admitting: Neurology

## 2016-08-20 ENCOUNTER — Other Ambulatory Visit: Payer: Self-pay | Admitting: *Deleted

## 2016-08-20 DIAGNOSIS — R200 Anesthesia of skin: Secondary | ICD-10-CM

## 2016-08-20 DIAGNOSIS — R202 Paresthesia of skin: Principal | ICD-10-CM

## 2016-08-27 ENCOUNTER — Telehealth: Payer: Self-pay | Admitting: Internal Medicine

## 2016-08-27 NOTE — Telephone Encounter (Signed)
Patient states that he did have a colon at the [place]. He will have reports faxed. He says that he is wanting to come back to our office because he feels that the [facility] [place] does not take good care of the elderly patients

## 2016-08-28 ENCOUNTER — Other Ambulatory Visit: Payer: Medicare Other

## 2016-09-03 ENCOUNTER — Ambulatory Visit: Payer: Medicare Other | Admitting: Neurology

## 2016-09-05 ENCOUNTER — Other Ambulatory Visit: Payer: Medicare Other

## 2016-09-11 ENCOUNTER — Ambulatory Visit
Admission: RE | Admit: 2016-09-11 | Discharge: 2016-09-11 | Disposition: A | Discharge status: Home / Self Care | Payer: Medicare Other | Source: Ambulatory Visit | Attending: Family Medicine | Admitting: Family Medicine

## 2016-09-11 DIAGNOSIS — R1011 Right upper quadrant pain: Secondary | ICD-10-CM

## 2016-09-12 ENCOUNTER — Encounter: Payer: Self-pay | Admitting: Family Medicine

## 2016-09-24 NOTE — Telephone Encounter (Signed)
Dr. Henrene Pastor reviewed records and has accepted patient. Ok to schedule OV. I have left patient several messages to callback to schedule. Records will be in "records reviewed" folder.

## 2016-10-11 ENCOUNTER — Telehealth: Payer: Self-pay | Admitting: Cardiovascular Disease

## 2016-10-11 ENCOUNTER — Encounter: Payer: Self-pay | Admitting: Internal Medicine

## 2016-10-11 NOTE — Telephone Encounter (Signed)
Pt had the wrong doctor

## 2016-10-17 ENCOUNTER — Encounter: Payer: Self-pay | Admitting: Family Medicine

## 2016-10-17 ENCOUNTER — Ambulatory Visit (INDEPENDENT_AMBULATORY_CARE_PROVIDER_SITE_OTHER): Payer: Medicare Other | Admitting: Family Medicine

## 2016-10-17 VITALS — BP 137/84 | HR 65 | Temp 98.3°F | Ht 75.0 in | Wt 178.0 lb

## 2016-10-17 DIAGNOSIS — L739 Follicular disorder, unspecified: Secondary | ICD-10-CM

## 2016-10-17 MED ORDER — DOXYCYCLINE HYCLATE 100 MG PO CAPS: 100.0000 mg | ORAL_CAPSULE | Freq: Two times a day (BID) | ORAL | 0 refills | Status: DC

## 2016-10-17 NOTE — Patient Instructions (Signed)
WE NOW OFFER   [facility] FAST TRACK!!!  SAME DAY Appointments for ACUTE CARE  Such as: Sprains, Injuries, cuts, abrasions, rashes, muscle pain, joint pain, back pain Colds, flu, sore throats, headache, allergies, cough, fever  Ear pain, sinus and eye infections Abdominal pain, nausea, vomiting, diarrhea, upset stomach Animal/insect bites  3 Easy Ways to Schedule: Walk-In Scheduling Call in scheduling Mychart Sign-up: [email or web address]         

## 2016-10-17 NOTE — Progress Notes (Signed)
   Subjective:    Patient ID: David David, male    DOB: 1950-10-15, 66 y.o.   MRN: [id number]  HPI Here for an itchy rash on the chest that started about 8 months ago. He feels fine in general.    Review of Systems  Constitutional: Negative.   Respiratory: Negative.   Cardiovascular: Negative.   Skin: Positive for rash.       Objective:   Physical Exam  Constitutional: He appears well-developed and well-nourished.  Cardiovascular: Normal rate, regular rhythm, normal heart sounds and intact distal pulses.   Pulmonary/Chest: Effort normal and breath sounds normal. No respiratory distress. He has no wheezes. He has no rales.  Skin:  Faint papulopustular rash over the chest          Assessment & Plan:  Folliculitis, treat with Doxycycline.  Alysia Penna, MD

## 2016-10-27 NOTE — Progress Notes (Signed)
HPI:   David David is a 65 y.o. male, who is here today to follow on some chronic medical problems.  I saw him last on 07/29/16. He was referred to GI because he was due for colonoscopy and also c/u upper abdominal pain. RUQ pain has resolved.  GERD: He is currently on Protonix 40 mg daily.  Denies abdominal pain, nausea, vomiting, changes in bowel habits, blood in stool or melena.   He has an appt with Dr Henrene Pastor 11/2016   HTN:  Currently he is on Amlodipine 5 mg daily. Denies severe/frequent headache, visual changes, chest pain, dyspnea, palpitation, claudication, focal weakness, or edema.  Lab Results  Component Value Date   CREATININE 0.88 07/01/2016   BUN 13 07/01/2016   NA 140 07/01/2016   K 4.5 07/01/2016   CL 108 07/01/2016   CO2 27 07/01/2016   Last eye exam 4 months.  Anxiety and depression:  He is currently on Remeron 30 mg at bedtime, increased from 15 mg last OV. He thought it was "so strong" , so he went back to Remeron 15 mg daily. He does not specified what symptoms he had with higher dose of Remeron.  He sees psych thought the [place] q 2-3 months.   He has also been concerned about wt loss.  Wt Readings from Last 3 Encounters:  10/28/16 178 lb 6 oz (80.9 kg)  10/17/16 178 lb (80.7 kg)  07/29/16 161 lb 4 oz (73.1 kg)    Concerns today: Rash  Since his lat OV he had an acute visit with Dr Sarajane Jews for pruritic rash. This rash has been going on for 1-2  years. Last OV I did refer him to dermatologists, he cancelled appt.  He started abx treatment and discontinued because cause photosensitivity and made rash worse. He states that abx did not help. I did recommend topical steroid a few months ago and he reported some improvement last OV. He is very upset because treatments have not helped.  He wants something that helps to "get rid" of rash. He has seen dermatologists through the [place].  Very pruritic rash on scalp,neck,upper chest, and  extremities.   He is very upset. He does not think I am spending enough time with him during his OV's. He is asking me if I have read CD with his records, which he dropped off a few days ago.  He has not smoked for 3 months.  Also concerned about a "place" on his back: Lesion was removed a few years ago and still feels a knot on area. It gets bigger and inflamed intermittently. Some "stuff in there" that his daughter squeezes out. He is not having symptoms at this time.  He also mentions that recently he had thyroid Bx through the [place] , it seems like sample was not enough , so he has to go back.  On 07/04/16 he followed with Dr Erlinda Hong, ortho.   Review of Systems  Constitutional: Positive for fatigue. Negative for activity change, appetite change and fever.  HENT: Negative for nosebleeds, sore throat and trouble swallowing.   Eyes: Negative for redness and visual disturbance.  Respiratory: Negative for cough, shortness of breath and wheezing.   Cardiovascular: Negative for chest pain, palpitations and leg swelling.  Gastrointestinal: Negative for abdominal pain, nausea and vomiting.  Genitourinary: Negative for decreased urine volume and hematuria.  Skin: Positive for rash. Negative for wound.  Neurological: Negative for seizures, weakness and headaches.  Hematological: Negative  for adenopathy. Does not bruise/bleed easily.  Psychiatric/Behavioral: Negative for confusion. The patient is nervous/anxious.       Current Outpatient Prescriptions on File Prior to Visit  Medication Sig Dispense Refill  . amLODipine (NORVASC) 10 MG tablet Take 5 mg by mouth daily.      Marland Kitchen aspirin EC 81 MG tablet Take 1 tablet (81 mg total) by mouth daily. 90 tablet 3  . cholecalciferol (VITAMIN D) 1000 UNITS tablet Take 1,000 Units by mouth daily.    . clopidogrel (PLAVIX) 75 MG tablet Take 1 tablet (75 mg total) by mouth daily. (Patient taking differently: Take 75 mg by mouth at bedtime. ) 30 tablet 3  .  cyclobenzaprine (FLEXERIL) 10 MG tablet Take 1 tablet (10 mg total) by mouth at bedtime. 30 tablet   . escitalopram (LEXAPRO) 10 MG tablet Take 1 tablet (10 mg total) by mouth 3 (three) times daily.    . folic acid (FOLVITE) 1 MG tablet Take 1 mg by mouth daily.     Marland Kitchen gabapentin (NEURONTIN) 300 MG capsule Take 600 mg by mouth 2 (two) times daily.    . hydrOXYzine (ATARAX) 50 MG tablet Take 50 mg by mouth 2 (two) times daily.     Marland Kitchen levothyroxine (SYNTHROID, LEVOTHROID) 75 MCG tablet Take 1 tablet (75 mcg total) by mouth daily before breakfast.    . multivitamin (THERAGRAN) per tablet Take 1 tablet by mouth daily.      Marland Kitchen PRAVASTATIN SODIUM PO Take 40 mg by mouth daily.     . vitamin E 600 UNIT capsule Take 600 Units by mouth daily.       No current facility-administered medications on file prior to visit.      Past Medical History:  Diagnosis Date  . Alcoholism in family   . Anxiety   . Depressive disorder, not elsewhere classified   . Diverticulosis of colon (without mention of hemorrhage)   . Dysfunction of eustachian tube   . Exposure to STD   . Family history of diabetes mellitus   . Family history of psychiatric condition    family Hx of suicide   . GERD (gastroesophageal reflux disease)   . Impotence of organic origin    erectile dysfunction  . Lipoprotein deficiencies   . Other diseases of lung, not elsewhere classified    pulmonary nodule  . Peripheral vascular disease, unspecified ([place])    lower extremity peripheral artery disease  . Personal history of colonic polyps   . Routine general medical examination at a health care facility   . Sprain of neck    Hx  . Thyroid disease   . Tobacco use disorder   . Unspecified hypertensive kidney disease with chronic kidney disease stage I through stage IV, or unspecified(403.90)    cholesterol embolization syndrome   Past Surgical History:  Procedure Laterality Date  . angiography     bilateral lower extremity angiography,  stenting of the left.   Marland Kitchen CARDIAC CATHETERIZATION N/A 07/19/2014   Procedure: Left Heart Cath and Coronary Angiography;  Surgeon: Leonie Man, MD;  Location: [facility] INVASIVE CV LAB CUPID;  Service: Cardiovascular;  Laterality: N/A;  . EYE SURGERY Right   . LOWER EXTREMITY ANGIOGRAM N/A 11/04/2012   Procedure: LOWER EXTREMITY ANGIOGRAM;  Surgeon: Wellington Hampshire, MD;  Location: [place] CATH LAB;  Service: Cardiovascular;  Laterality: N/A;  . POPLITEAL ARTERY ANGIOPLASTY     didnt specify angioplasty or stent  . SPERMATOCELECTOMY     left  Allergies  Allergen Reactions  . Neomycin-Bacitracin Zn-Polymyx     unknown  . Niacin     unknown  . Paroxetine     REACTION: goofy   Family History  Problem Relation Age of Onset  . CAD Father   . Congestive Heart Failure Mother   . Cancer Brother   . Alzheimer's disease Sister   . Alzheimer's disease Brother   . Diabetes Brother     Social History   Social History  . Marital status: Single    Spouse name: N/A  . Number of children: 1  . Years of education: N/A   Occupational History  . Doesn't work    Social History Main Topics  . Smoking status: Former Smoker    Types: Cigarettes    Start date: 07/22/2016  . Smokeless tobacco: Never Used     Comment: QUIT 2009  . Alcohol use 0.0 oz/week     Comment:    . Drug use: Yes    Types: Marijuana  . Sexual activity: Not Asked   Other Topics Concern  . None   Social History Narrative   No diet, not restricting cholesterol.    Vitals:   10/28/16 0926 10/28/16 1032  BP: (!) 146/86 140/80  Pulse: 76   Resp: 12   SpO2: 97%    Body mass index is 22.3 kg/m.   Physical Exam  Nursing note and vitals reviewed. Constitutional: He is oriented to person, place, and time. He appears well-developed and well-nourished. No distress.  HENT:  Head: Normocephalic and atraumatic.  Mouth/Throat: Oropharynx is clear and moist and mucous membranes are normal.  Eyes: Pupils are equal, round,  and reactive to light. Conjunctivae and EOM are normal.  Cardiovascular: Normal rate and regular rhythm.   No murmur heard. Pulses:      Dorsalis pedis pulses are 2+ on the right side, and 2+ on the left side.  Respiratory: Effort normal and breath sounds normal. No respiratory distress.  GI: Soft. He exhibits no mass. There is no hepatomegaly. There is no tenderness.  Musculoskeletal: He exhibits no edema or tenderness.  Lymphadenopathy:    He has no cervical adenopathy.  Neurological: He is alert and oriented to person, place, and time. He has normal strength. Gait normal.  Skin: Skin is warm. Rash noted. Rash is maculopapular. No erythema.  Maculopapular lesions scattered on chest, neck, forearms,and elbows. A couple on LE's No tenderness or local heat. Some crusty/scaly.  On mid of his back there is a black punctuate lesion and a 3-4 mm papular lesion palpated underneath. No tenderness or erythema, no drainage.    Psychiatric: His mood appears anxious. His affect is blunt.  Well groomed, good eye contact.    ASSESSMENT AND PLAN:   David David was seen today for follow-up.  Diagnoses and all orders for this visit:  Gastroesophageal reflux disease, esophagitis presence not specified  Stable. GERD precautions discussed. No changes in current management. Continue following with Dr Henrene Pastor.  Chronic pruritic rash in adult  Rash on scalp, neck , upper chest,and elbows could be seborrheic dermatitis. He agrees with trying Nizoral shampoo. He cancelled derma appt and requesting for [place] to schedule it again.  -     Ambulatory referral to Dermatology -     ketoconazole (NIZORAL) 2 % shampoo; Apply 1 application topically 2 (two) times a week.  Essential hypertension  Re-checked and improved. No changes in current management. DASH diet recommended. Eye exam recommended annually.  Recurrent  major depressive disorder, in partial remission (HCC)  And anxiety. Stable for now,  no changes. He will continue following with psychiatrists at the [place].  -     mirtazapine (REMERON) 15 MG tablet; Take 1 tablet (15 mg total) by mouth at bedtime.  Sebaceous cyst  Educated about Dx and treatment options as well as the risk of recurrences. He can also address this problem with dermatologists. It is very small at this time.     Today face to face visit from 9:50 Am to 10:32 Am. > 50% was dedicated to discussion of differential Dx (mainly rash), prognosis, treatment options, and coordination of care. He also took extra time to voice his dissatisfaction with his care, limitation on problems he can discuss during one visit. I called to his attention that I have spent 40 min or more face to face during his prior visits.  I explained that sometimes I cannot addresses all concerns in one visit.Furthermore most of his problems are chronic and he follows with different specialists (ortho,cardio,rheuma among some).  He gets more frustrated when I explained that he needs to follow with dermatologists as we planned last OV. I did congratulate him for smoking cessation, to which he replies: "I did not say I quit" , states that he may want to resume smoking again. Educated about benefits of smoking cessation.   His PCP is at the [place].    Tyrene Nader G. [name], MD  [facility]. [place] office.

## 2016-10-28 ENCOUNTER — Ambulatory Visit (INDEPENDENT_AMBULATORY_CARE_PROVIDER_SITE_OTHER): Payer: Medicare Other | Admitting: Family Medicine

## 2016-10-28 ENCOUNTER — Encounter: Payer: Self-pay | Admitting: Family Medicine

## 2016-10-28 VITALS — BP 140/80 | HR 76 | Resp 12 | Ht 75.0 in | Wt 178.4 lb

## 2016-10-28 DIAGNOSIS — I10 Essential (primary) hypertension: Secondary | ICD-10-CM

## 2016-10-28 DIAGNOSIS — L723 Sebaceous cyst: Secondary | ICD-10-CM

## 2016-10-28 DIAGNOSIS — K219 Gastro-esophageal reflux disease without esophagitis: Secondary | ICD-10-CM | POA: Diagnosis not present

## 2016-10-28 DIAGNOSIS — F3341 Major depressive disorder, recurrent, in partial remission: Secondary | ICD-10-CM | POA: Diagnosis not present

## 2016-10-28 DIAGNOSIS — L298 Other pruritus: Secondary | ICD-10-CM | POA: Diagnosis not present

## 2016-10-28 MED ORDER — KETOCONAZOLE 2 % EX SHAM: 1.0000 "application " | MEDICATED_SHAMPOO | CUTANEOUS | 1 refills | Status: DC

## 2016-10-28 MED ORDER — MIRTAZAPINE 15 MG PO TABS: 15.0000 mg | ORAL_TABLET | Freq: Every day | ORAL | 2 refills | Status: AC

## 2016-10-28 NOTE — Patient Instructions (Addendum)
A few things to remember from today's visit:   Essential hypertension  Recurrent major depressive disorder, in partial remission ([place]) - Plan: mirtazapine (REMERON) 15 MG tablet  Gastroesophageal reflux disease, esophagitis presence not specified  Chronic pruritic rash in adult - Plan: Ambulatory referral to Dermatology, ketoconazole (NIZORAL) 2 % shampoo   Please be sure medication list is accurate. If a new problem present, please set up appointment sooner than planned today.

## 2016-11-06 ENCOUNTER — Telehealth: Payer: Self-pay

## 2016-11-06 NOTE — Telephone Encounter (Signed)
Left voicemail for patient letting him know he can come pick up his CD with his records on it whenever he is able. They have already been printed & placed on Dr. Doug Sou desk.

## 2016-11-11 ENCOUNTER — Encounter: Payer: Self-pay | Admitting: Family Medicine

## 2016-11-14 ENCOUNTER — Encounter: Payer: Self-pay | Admitting: Family Medicine

## 2016-11-16 ENCOUNTER — Encounter: Payer: Self-pay | Admitting: Family Medicine

## 2016-11-20 ENCOUNTER — Other Ambulatory Visit: Payer: Self-pay | Admitting: Family Medicine

## 2016-11-20 DIAGNOSIS — N50812 Left testicular pain: Secondary | ICD-10-CM

## 2016-12-04 ENCOUNTER — Encounter: Payer: Self-pay | Admitting: Neurology

## 2016-12-04 ENCOUNTER — Ambulatory Visit (INDEPENDENT_AMBULATORY_CARE_PROVIDER_SITE_OTHER): Payer: Medicare Other | Admitting: Neurology

## 2016-12-04 VITALS — BP 132/76 | HR 75 | Ht 75.0 in | Wt 168.0 lb

## 2016-12-04 DIAGNOSIS — R413 Other amnesia: Secondary | ICD-10-CM | POA: Diagnosis not present

## 2016-12-04 NOTE — Progress Notes (Signed)
NEUROLOGY FOLLOW UP OFFICE NOTE  TARVARES David [id number]  HISTORY OF PRESENT ILLNESS: I had the pleasure of seeing David David in follow-up in the neurology clinic on 12/04/2016.  The patient was last seen a year ago for memory changes with strong family history of dementia. His MOCA score in September 2017 was 30/30 (30/30 in January 2017). Since his last visit, he feels his memory is not too bad. He denies getting lost driving, but has to think a little more of where he is going. When he is distracted, he would miss a turn. He denies any missed bills or medications. He has not been told that he repeats himself. He has been having neck pain, it is harder to turn his head side to side. He has difficulty walking long distances due to leg issues. He denies any headaches, dizziness, vision changes, focal numbness/tingling. No difficulties with ADLs. No falls.   HPI 03/30/2015: This is a pleasant 66 yo RH man with a history of peripheral vascular disease, hypertension, hyperlipidemia, hypothyroidism, PTSD, who presented for evaluation of worsening memory loss. He has a strong family history of dementia in 3 siblings, who were diagnosed at age 68 or so. He and his daughter started noticing changes a little over a year ago, he would start a conversation and forget what the conversation was about. He would start to say something and forget what he was going to say, getting halfway through a sentence then the rest would not come out. He feels like his head and mouth are not working together. His daughter has noticed this as well, he gets blank when trying to process, with long pauses in conversations that have become more frequent. He denies getting lost driving, but has gotten confused a couple of times. One time he could not find his cousin's house because he forgot what road she lived on. He was eventually able to remember it after speaking to his daughter. He lives with his daughter and denies any missed  medications or bill payments (he reports there are no bills to pay). His daughter reports he is pretty well-organized, he denies misplacing things frequently. She reports he gets a little more frustrated easily, but no paranoia any different from his history of PTSD.   He has blurred vision from a right cataract, occasional neck and back pain, on and off constipation. He has numbness in his feet, no pain.  No significant head injuries. He reports having a seizure in 2005 when he was on a significant amount of Wellbutrin for PTSD. He has PTSD as a [place] veteran. He drinks a glass of wine or beer 1-2 times a week. His older sister was diagnosed with dementia at age 68 or 54 and passed away 2 years ago. His brother now 51 has dementia. His sister is 61 and has dementia. He "does not want to get the way they are if he can get help now."   Laboratory Data: He reports having TSH done at the [place] in October with normal TSH. I personally reviewed MRI brain without contrast which did not show any acute changes, there was mild diffuse atrophy. His B12 was normal.   PAST MEDICAL HISTORY: Past Medical History:  Diagnosis Date  . Alcoholism in family   . Anxiety   . Depressive disorder, not elsewhere classified   . Diverticulosis of colon (without mention of hemorrhage)   . Dysfunction of eustachian tube   . Exposure to STD   . Family history  of diabetes mellitus   . Family history of psychiatric condition    family Hx of suicide   . GERD (gastroesophageal reflux disease)   . Impotence of organic origin    erectile dysfunction  . Lipoprotein deficiencies   . Other diseases of lung, not elsewhere classified    pulmonary nodule  . Peripheral vascular disease, unspecified ([place])    lower extremity peripheral artery disease  . Personal history of colonic polyps   . Routine general medical examination at a health care facility   . Sprain of neck    Hx  . Thyroid disease   . Tobacco use disorder   .  Unspecified hypertensive kidney disease with chronic kidney disease stage I through stage IV, or unspecified(403.90)    cholesterol embolization syndrome    MEDICATIONS: Current Outpatient Prescriptions on File Prior to Visit  Medication Sig Dispense Refill  . amLODipine (NORVASC) 10 MG tablet Take 5 mg by mouth daily.      Marland Kitchen aspirin EC 81 MG tablet Take 1 tablet (81 mg total) by mouth daily. 90 tablet 3  . cholecalciferol (VITAMIN D) 1000 UNITS tablet Take 1,000 Units by mouth daily.    . clopidogrel (PLAVIX) 75 MG tablet Take 1 tablet (75 mg total) by mouth daily. (Patient taking differently: Take 75 mg by mouth at bedtime. ) 30 tablet 3  . cyclobenzaprine (FLEXERIL) 10 MG tablet Take 1 tablet (10 mg total) by mouth at bedtime. 30 tablet   . escitalopram (LEXAPRO) 10 MG tablet Take 1 tablet (10 mg total) by mouth 3 (three) times daily.    . folic acid (FOLVITE) 1 MG tablet Take 1 mg by mouth daily.     Marland Kitchen gabapentin (NEURONTIN) 300 MG capsule Take 600 mg by mouth 2 (two) times daily.    . hydrOXYzine (ATARAX) 50 MG tablet Take 50 mg by mouth 2 (two) times daily.     Marland Kitchen ketoconazole (NIZORAL) 2 % shampoo Apply 1 application topically 2 (two) times a week. 120 mL 1  . levothyroxine (SYNTHROID, LEVOTHROID) 75 MCG tablet Take 1 tablet (75 mcg total) by mouth daily before breakfast.    . mirtazapine (REMERON) 15 MG tablet Take 1 tablet (15 mg total) by mouth at bedtime. 30 tablet 2  . multivitamin (THERAGRAN) per tablet Take 1 tablet by mouth daily.      Marland Kitchen PRAVASTATIN SODIUM PO Take 40 mg by mouth daily.     . vitamin E 600 UNIT capsule Take 600 Units by mouth daily.       No current facility-administered medications on file prior to visit.     ALLERGIES: Allergies  Allergen Reactions  . Neomycin-Bacitracin Zn-Polymyx     unknown  . Niacin     unknown  . Paroxetine     REACTION: goofy    FAMILY HISTORY: Family History  Problem Relation Age of Onset  . CAD Father   . Congestive  Heart Failure Mother   . Cancer Brother   . Alzheimer's disease Sister   . Alzheimer's disease Brother   . Diabetes Brother     SOCIAL HISTORY: Social History   Social History  . Marital status: Single    Spouse name: N/A  . Number of children: 1  . Years of education: N/A   Occupational History  . Doesn't work    Social History Main Topics  . Smoking status: Former Smoker    Types: Cigarettes    Start date: 07/22/2016  . Smokeless tobacco:  Never Used     Comment: QUIT 2009  . Alcohol use 0.0 oz/week     Comment:    . Drug use: Yes    Types: Marijuana  . Sexual activity: Not on file   Other Topics Concern  . Not on file   Social History Narrative   No diet, not restricting cholesterol.    REVIEW OF SYSTEMS: Constitutional: No fevers, chills, or sweats, no generalized fatigue, change in appetite Eyes: No visual changes, double vision, eye pain Ear, nose and throat: No hearing loss, ear pain, nasal congestion, sore throat Cardiovascular: No chest pain, palpitations Respiratory:  No shortness of breath at rest or with exertion, wheezes GastrointestinaI: No nausea, vomiting, diarrhea, abdominal pain, fecal incontinence Genitourinary:  No dysuria, urinary retention or frequency Musculoskeletal:  + neck pain, back pain Integumentary: No rash, pruritus, skin lesions Neurological: as above Psychiatric: No depression, insomnia, anxiety Endocrine: No palpitations, fatigue, diaphoresis, mood swings, change in appetite, change in weight, increased thirst Hematologic/Lymphatic:  No anemia, purpura, petechiae. Allergic/Immunologic: no itchy/runny eyes, nasal congestion, recent allergic reactions, rashes  PHYSICAL EXAM: Vitals:   12/04/16 1132  BP: 132/76  Pulse: 75  SpO2: 97%   General: No acute distress Head:  Normocephalic/atraumatic Skin/Extremities: No rash, no edema Neurological Exam: alert and oriented to person, place, and time. No aphasia or dysarthria. Fund of  knowledge is appropriate.  Recent and remote memory are intact.  Attention and concentration are normal.    Able to name objects and repeat phrases.  Montreal Cognitive Assessment  12/04/2016 12/06/2015 03/30/2015  Visuospatial/ Executive (0/5) 5 5 5   Naming (0/3) 3 3 3   Attention: Read list of digits (0/2) 2 2 2   Attention: Read list of letters (0/1) 1 1 1   Attention: Serial 7 subtraction starting at 100 (0/3) 3 3 3   Language: Repeat phrase (0/2) 2 2 2   Language : Fluency (0/1) 1 1 1   Abstraction (0/2) 2 2 2   Delayed Recall (0/5) 5 5 5   Orientation (0/6) 6 6 6   Total 30 30 30   Adjusted Score (based on education) - 30 30   Cranial nerves: Pupils equal, round. No facial asymmetry. Motor: moves all extremities symmetrically. Gait narrow-based, no ataxia.   IMPRESSION: This is a pleasant 66 yo RH man with vascular risk factors including hypertension, hyperlipidemia, peripheral vascular disease, strong family history of dementia, who presented for worsening memory loss, particularly with word-finding difficulties. He has not noticed significant change in the past year. His neurological exam is non-focal, MOCA score today again normal 30/30. MRI brain unremarkable. No clear indication to start cholinesterase inhibitors. We again discussed the importance of control of vascular risk factors, physical exercise, and brain stimulation exercises for brain health. He is noticing more neck pain and will discuss with his PCP. He will follow-up in 1 year and knows to call for any changes.  Thank you for allowing me to participate in his care.  Please do not hesitate to call for any questions or concerns.  The duration of this appointment visit was 25 minutes of face-to-face time with the patient.  Greater than 50% of this time was spent in counseling, explanation of diagnosis, planning of further management, and coordination of care.   Ellouise Newer, M.D.   CC: Dr. [name], [facility]

## 2016-12-04 NOTE — Patient Instructions (Signed)
You look great. Continue control of blood pressure, cholesterol, as well as physical exercise and brain stimulation exercises for brain health. Follow-up in 1 year, call for any changes.

## 2016-12-05 ENCOUNTER — Ambulatory Visit: Payer: Medicare Other | Admitting: Neurology

## 2016-12-05 ENCOUNTER — Encounter: Payer: Self-pay | Admitting: Family Medicine

## 2016-12-09 ENCOUNTER — Encounter: Payer: Self-pay | Admitting: Internal Medicine

## 2016-12-09 ENCOUNTER — Ambulatory Visit (INDEPENDENT_AMBULATORY_CARE_PROVIDER_SITE_OTHER): Payer: Medicare Other | Admitting: Internal Medicine

## 2016-12-09 VITALS — BP 136/70 | HR 76 | Ht 73.33 in | Wt 169.2 lb

## 2016-12-09 DIAGNOSIS — K219 Gastro-esophageal reflux disease without esophagitis: Secondary | ICD-10-CM

## 2016-12-09 DIAGNOSIS — Z5181 Encounter for therapeutic drug level monitoring: Secondary | ICD-10-CM | POA: Diagnosis not present

## 2016-12-09 DIAGNOSIS — Z7902 Long term (current) use of antithrombotics/antiplatelets: Secondary | ICD-10-CM

## 2016-12-09 DIAGNOSIS — I739 Peripheral vascular disease, unspecified: Secondary | ICD-10-CM

## 2016-12-09 DIAGNOSIS — Z860100 Personal history of colon polyps, unspecified: Secondary | ICD-10-CM

## 2016-12-09 MED ORDER — NA SULFATE-K SULFATE-MG SULF 17.5-3.13-1.6 GM/177ML PO SOLN: 1.0000 | Freq: Once | ORAL | 0 refills | Status: AC

## 2016-12-09 NOTE — Patient Instructions (Signed)

## 2016-12-09 NOTE — Progress Notes (Addendum)
HISTORY OF PRESENT ILLNESS:  David David is a 66 y.o. male with multiple medical problems as listed below who is referred by his primary care provider Dr. [name] regarding colonoscopy. The patient has not been seen in this office for some time. Last colonoscopy performed here 08/21/2004 was normal. He tells me that he had been getting his care at the [facility]. He thinks he may have had a colonoscopy more than 5 years ago and may have had polyps. No details. He states he is due for follow-up wishes to have that examination performed here. Some records from the [name] have been reviewed but no colonoscopy report found. Patient's GI review of systems is remarkable for reflux which is controlled with omeprazole. She does report some weight loss and transient change in his bowel habits which have resolved. No bleeding. He is on aspirin and Plavix for peripheral vascular disease and peripheral vascular stent placed remotely. He has come off Plavix in the past for eye surgery.  REVIEW OF SYSTEMS:  All non-GI ROS negative except for anxiety, arthritis, depression, fatigue, visual change, itching, skin rash, excessive urination  Past Medical History:  Diagnosis Date  . Alcoholism in family   . Anxiety   . Arthritis   . Depressive disorder, not elsewhere classified   . Diverticulosis of colon (without mention of hemorrhage)   . Dysfunction of eustachian tube   . Exposure to STD   . Family history of diabetes mellitus   . Family history of psychiatric condition    family Hx of suicide   . GERD (gastroesophageal reflux disease)   . Impotence of organic origin    erectile dysfunction  . Lipoprotein deficiencies   . Other diseases of lung, not elsewhere classified    pulmonary nodule  . Peripheral vascular disease, unspecified ([place])    lower extremity peripheral artery disease  . Personal history of colonic polyps   . Routine general medical examination at a health care  facility   . Seizures ([place])   . Sprain of neck    Hx  . Thyroid disease   . Tobacco use disorder   . Unspecified hypertensive kidney disease with chronic kidney disease stage I through stage IV, or unspecified(403.90)    cholesterol embolization syndrome    Past Surgical History:  Procedure Laterality Date  . angiography     bilateral lower extremity angiography, stenting of the left.   Marland Kitchen CARDIAC CATHETERIZATION N/A 07/19/2014   Procedure: Left Heart Cath and Coronary Angiography;  Surgeon: Leonie Man, MD;  Location: [facility] INVASIVE CV LAB CUPID;  Service: Cardiovascular;  Laterality: N/A;  . CATARACT EXTRACTION Bilateral   . EYE SURGERY  2016   blown macular  . KNEE ARTHROSCOPY Left    x 2  . LOWER EXTREMITY ANGIOGRAM N/A 11/04/2012   Procedure: LOWER EXTREMITY ANGIOGRAM;  Surgeon: Wellington Hampshire, MD;  Location: [facility] CATH LAB;  Service: Cardiovascular;  Laterality: N/A;  . POPLITEAL ARTERY ANGIOPLASTY     didnt specify angioplasty or stent  . SPERMATOCELECTOMY Left    testicular cyst  . VASECTOMY      Social History David David  reports that he has quit smoking. His smoking use included Cigarettes. He started smoking about 4 months ago. He has never used smokeless tobacco. He reports that he drinks alcohol. He reports that he uses drugs, including Marijuana.  family history includes Alzheimer's disease in his brother and sister; CAD in his father; Congestive Heart  Failure in his mother; Diabetes in his brother; Lung cancer in his brother.  Allergies  Allergen Reactions  . Neomycin-Bacitracin Zn-Polymyx     unknown  . Niacin     unknown  . Paroxetine     REACTION: goofy       PHYSICAL EXAMINATION: Vital signs: BP 136/70 (BP Location: Left Arm, Patient Position: Sitting, Cuff Size: Normal)   Pulse 76   Ht 6' 1.33" (1.863 m) Comment: height measured without shoes  Wt 169 lb 4 oz (76.8 kg)   BMI 22.13 kg/m   Constitutional: generally well-appearing, no acute  distress Psychiatric: alert and oriented x3, cooperative Eyes: extraocular movements intact, anicteric, conjunctiva pink Mouth: oral pharynx moist, no lesions Neck: supple no lymphadenopathy Cardiovascular: heart regular rate and rhythm, no murmur Lungs: clear to auscultation bilaterally Abdomen: soft, nontender, nondistended, no obvious ascites, no peritoneal signs, normal bowel sounds, no organomegaly Rectal: Deferred until colonoscopy Extremities: no clubbing cyanosis or lower extremity edema bilaterally Skin: no lesions on visible extremities Neuro: No focal deficits. Cranial nerves intact  ASSESSMENT:  #1. Reported history of colon polyps elsewhere. Reports due for follow-up. Wishes to have that exam here. Previous colonoscopy here 2006 was normal #2. GERD. No alarm features. Asymptomatic on PPI #3. Multiple medical problems. On aspirin and Plavix for peripheral vascular disease and remote stent placement   PLAN:  #1. Surveillance colonoscopy. Patient's high-risk given his comorbidities and the need to address antiplatelet therapy.The nature of the procedure, as well as the risks, benefits, and alternatives were carefully and thoroughly reviewed with the patient. Ample time for discussion and questions allowed. The patient understood, was satisfied, and agreed to proceed. #2. Recommended to hold Plavix 5 days prior to the procedure. However continued on daily baby aspirin #3. Reflux cautions #4. Continue PPI  A copy of this consultation note has been sent to Dr. [name]  ADDENDUM 12/23/2016: Outside records from [facility] received and reviewed and summarized below: 1. Complete colonoscopy March 2015 was normal 2. Flexible sigmoidoscopy March 2016 was normal

## 2016-12-12 DIAGNOSIS — R102 Pelvic and perineal pain: Secondary | ICD-10-CM | POA: Diagnosis not present

## 2017-01-27 ENCOUNTER — Encounter: Payer: Self-pay | Admitting: Internal Medicine

## 2017-01-27 DIAGNOSIS — R102 Pelvic and perineal pain: Secondary | ICD-10-CM | POA: Diagnosis not present

## 2017-02-10 ENCOUNTER — Encounter: Payer: Self-pay | Admitting: Internal Medicine

## 2017-02-10 ENCOUNTER — Ambulatory Visit (AMBULATORY_SURGERY_CENTER): Payer: Medicare Other | Admitting: Internal Medicine

## 2017-02-10 VITALS — BP 129/77 | HR 76 | Temp 97.8°F | Resp 15 | Ht 73.0 in | Wt 169.0 lb

## 2017-02-10 DIAGNOSIS — Z86010 Personal history of colonic polyps: Secondary | ICD-10-CM | POA: Diagnosis present

## 2017-02-10 MED ORDER — SODIUM CHLORIDE 0.9 % IV SOLN: 500.0000 mL | INTRAVENOUS | Status: DC

## 2017-02-10 NOTE — Op Note (Signed)
[facility] Patient Name: David David Procedure Date: 02/10/2017 1:51 PM MRN: [id number] Endoscopist: Docia Chuck. Henrene Pastor , MD Age: 66 Referring MD:  Date of Birth: [birth year] Gender: Male Account #: [id number] Procedure:                Colonoscopy Indications:              High risk colon cancer surveillance: Personal                            history of colonic polyps reported elsewhere and                            said to be due for follow-up. Examination here in                            2006 and elsewhere March 2015 were negative for                            neoplasia. Medicines:                Monitored Anesthesia Care Procedure:                Pre-Anesthesia Assessment:                           - Prior to the procedure, a History and Physical                            was performed, and patient medications and                            allergies were reviewed. The patient's tolerance of                            previous anesthesia was also reviewed. The risks                            and benefits of the procedure and the sedation                            options and risks were discussed with the patient.                            All questions were answered, and informed consent                            was obtained. Prior Anticoagulants: The patient has                            taken Plavix (clopidogrel), last dose was 5 days                            prior to procedure. ASA Grade Assessment: III - A  patient with severe systemic disease. After                            reviewing the risks and benefits, the patient was                            deemed in satisfactory condition to undergo the                            procedure.                           After obtaining informed consent, the colonoscope                            was passed under direct vision. Throughout the                            procedure, the  patient's blood pressure, pulse, and                            oxygen saturations were monitored continuously. The                            Colonoscope was introduced through the anus and                            advanced to the the cecum, identified by                            appendiceal orifice and ileocecal valve. The                            ileocecal valve, appendiceal orifice, and rectum                            were photographed. The quality of the bowel                            preparation was excellent. The colonoscopy was                            performed without difficulty. The patient tolerated                            the procedure well. The bowel preparation used was                            SUPREP. Scope In: 1:57:57 PM Scope Out: 2:09:20 PM Scope Withdrawal Time: 0 hours 8 minutes 5 seconds  Total Procedure Duration: 0 hours 11 minutes 23 seconds  Findings:                 The entire examined colon appeared normal on direct  and retroflexion views. No polyps or other                            abnormalities. Small internal hemorrhoids noted. Complications:            No immediate complications. Estimated blood loss:                            None. Estimated Blood Loss:     Estimated blood loss: none. Impression:               - Normal colonoscopy. No polyps or other                            abnormalities. Recommendation:           - Repeat colonoscopy in 10 years for surveillance.                           - Resume Plavix (clopidogrel) today at prior dose.                           - Patient has a contact number available for                            emergencies. The signs and symptoms of potential                            delayed complications were discussed with the                            patient. Return to normal activities tomorrow.                            Written discharge instructions were provided to the                             patient.                           - Resume previous diet.                           - Continue present medications. Docia Chuck. Henrene Pastor, MD 02/10/2017 2:15:11 PM This report has been signed electronically.

## 2017-02-10 NOTE — Progress Notes (Signed)
Report to PACU, RN, vss, BBS= Clear.  

## 2017-02-10 NOTE — Patient Instructions (Signed)
   INFORMATION ON HEMORRHOIDS GIVEN TO YOU TODAY  RESUME PLAVIX AT PRIOR DOSAGE TODAY   YOU HAD AN ENDOSCOPIC PROCEDURE TODAY AT [facility]:   Refer to the procedure report that was given to you for any specific questions about what was found during the examination.  If the procedure report does not answer your questions, please call your gastroenterologist to clarify.  If you requested that your care partner not be given the details of your procedure findings, then the procedure report has been included in a sealed envelope for you to review at your convenience later.  YOU SHOULD EXPECT: Some feelings of bloating in the abdomen. Passage of more gas than usual.  Walking can help get rid of the air that was put into your GI tract during the procedure and reduce the bloating. If you had a lower endoscopy (such as a colonoscopy or flexible sigmoidoscopy) you may notice spotting of blood in your stool or on the toilet paper. If you underwent a bowel prep for your procedure, you may not have a normal bowel movement for a few days.  Please Note:  You might notice some irritation and congestion in your nose or some drainage.  This is from the oxygen used during your procedure.  There is no need for concern and it should clear up in a day or so.  SYMPTOMS TO REPORT IMMEDIATELY:   Following lower endoscopy (colonoscopy or flexible sigmoidoscopy):  Excessive amounts of blood in the stool  Significant tenderness or worsening of abdominal pains  Swelling of the abdomen that is new, acute  Fever of 100F or higher   For urgent or emergent issues, a gastroenterologist can be reached at any hour by calling [phone].   DIET:  We do recommend a small meal at first, but then you may proceed to your regular diet.  Drink plenty of fluids but you should avoid alcoholic beverages for 24 hours.  ACTIVITY:  You should plan to take it easy for the rest of today and you should NOT DRIVE or  use heavy machinery until tomorrow (because of the sedation medicines used during the test).    FOLLOW UP: Our staff will call the number listed on your records the next business day following your procedure to check on you and address any questions or concerns that you may have regarding the information given to you following your procedure. If we do not reach you, we will leave a message.  However, if you are feeling well and you are not experiencing any problems, there is no need to return our call.  We will assume that you have returned to your regular daily activities without incident.  If any biopsies were taken you will be contacted by phone or by letter within the next 1-3 weeks.  Please call us at [phone] if you have not heard about the biopsies in 3 weeks.    SIGNATURES/CONFIDENTIALITY: You and/or your care partner have signed paperwork which will be entered into your electronic medical record.  These signatures attest to the fact that that the information above on your After Visit Summary has been reviewed and is understood.  Full responsibility of the confidentiality of this discharge information lies with you and/or your care-partner.

## 2017-02-10 NOTE — Progress Notes (Signed)
Pt's states no medical or surgical changes since previsit or office visit. 

## 2017-02-11 ENCOUNTER — Telehealth: Payer: Self-pay

## 2017-02-11 NOTE — Telephone Encounter (Signed)
  Follow up Call-  Call back number 02/10/2017  Post procedure Call Back phone  # [phone]  Permission to leave phone message Yes  Some recent data might be hidden     Patient questions:  Do you have a fever, pain , or abdominal swelling? No. Pain Score  0 *  Have you tolerated food without any problems? Yes.    Have you been able to return to your normal activities? Yes.    Do you have any questions about your discharge instructions: Diet   No. Medications  No. Follow up visit  No.  Do you have questions or concerns about your Care? No.  Actions: * If pain score is 4 or above: No action needed, pain <4.

## 2017-03-04 ENCOUNTER — Ambulatory Visit: Payer: Medicare Other | Admitting: Cardiovascular Disease

## 2017-03-04 ENCOUNTER — Encounter: Payer: Self-pay | Admitting: Cardiovascular Disease

## 2017-03-04 VITALS — BP 146/82 | HR 83 | Ht 73.0 in | Wt 171.8 lb

## 2017-03-04 DIAGNOSIS — I10 Essential (primary) hypertension: Secondary | ICD-10-CM | POA: Diagnosis not present

## 2017-03-04 DIAGNOSIS — I739 Peripheral vascular disease, unspecified: Secondary | ICD-10-CM | POA: Diagnosis not present

## 2017-03-04 DIAGNOSIS — E7849 Other hyperlipidemia: Secondary | ICD-10-CM | POA: Diagnosis not present

## 2017-03-04 NOTE — Progress Notes (Signed)
Cardiology Office Note   Date:  03/04/2017   ID:  David David, DOB 1950-06-25, MRN [id number]  PCP:  [name], [name], MD  Cardiologist:   Kathlyn Sacramento, MD   No chief complaint on file.     History of Present Illness: David David is a 66 y.o. male who presents for a follow-up visit regarding peripheral arterial disease. The patient has lower extremity peripheral arterial disease with recurrent atheroembolic events to the left foot due to distal left popliteal artery stenosis with previous Cypher drug-eluting stent placement which was used in order to treat an ulcerated plaque with distal embolization.  He was seen in 11/2012  for a painless rash involving the left great toe and medial foot, with typical appearance of an athero embolic event. Echocardiogram was unremarkable.  CTA of the chest abdomen and pelvis with runoff to the feet demonstrated scattered atheromatous the aorta but no significant findings.  The left tibioperoneal stent had greater than 70% stenosis. ABI was normal but there was significant dampening in pressure noted in the big toe. I proceeded with angiography which confirmed this and also showed an occluded dorsalis pedis likely from embolization. I performed balloon angioplasty with an Angioscore balloon to both ostial anterior tibial and ostial tibioperoneal trunk.  He had cardiac catheterization in May of 2016 which showed moderate left circumflex stenosis and no evidence of obstructive disease.  He has been doing well overall with no chest pain, shortness of breath or palpitations.  No leg claudication or ulceration. Quit smoking in May.  He continues to have bilateral foot numbness and is known to have severe neuropathy.   Past Medical History:  Diagnosis Date  . Alcoholism in family   . Anxiety   . Arthritis   . Depressive disorder, not elsewhere classified   . Diverticulosis of colon (without mention of hemorrhage)   . Dysfunction of eustachian  tube   . Exposure to STD   . Family history of diabetes mellitus   . Family history of psychiatric condition    family Hx of suicide   . GERD (gastroesophageal reflux disease)   . Impotence of organic origin    erectile dysfunction  . Lipoprotein deficiencies   . Other diseases of lung, not elsewhere classified    pulmonary nodule  . Peripheral vascular disease, unspecified ([place])    lower extremity peripheral artery disease  . Personal history of colonic polyps   . Routine general medical examination at a health care facility   . Seizures ([place])   . Sprain of neck    Hx  . Thyroid disease   . Tobacco use disorder   . Unspecified hypertensive kidney disease with chronic kidney disease stage I through stage IV, or unspecified(403.90)    cholesterol embolization syndrome    Past Surgical History:  Procedure Laterality Date  . angiography     bilateral lower extremity angiography, stenting of the left.   Marland Kitchen CARDIAC CATHETERIZATION N/A 07/19/2014   Procedure: Left Heart Cath and Coronary Angiography;  Surgeon: Leonie Man, MD;  Location: [facility] INVASIVE CV LAB CUPID;  Service: Cardiovascular;  Laterality: N/A;  . CATARACT EXTRACTION Bilateral   . EYE SURGERY  2016   blown macular  . KNEE ARTHROSCOPY Left    x 2  . LOWER EXTREMITY ANGIOGRAM N/A 11/04/2012   Procedure: LOWER EXTREMITY ANGIOGRAM;  Surgeon: Wellington Hampshire, MD;  Location: [facility] CATH LAB;  Service: Cardiovascular;  Laterality: N/A;  . POPLITEAL ARTERY  ANGIOPLASTY     didnt specify angioplasty or stent  . SPERMATOCELECTOMY Left    testicular cyst  . VASECTOMY       Current Outpatient Medications  Medication Sig Dispense Refill  . albuterol (PROVENTIL HFA;VENTOLIN HFA) 108 (90 Base) MCG/ACT inhaler Inhale 2 puffs into the lungs as needed for wheezing or shortness of breath.    Marland Kitchen amLODipine (NORVASC) 10 MG tablet Take 5 mg by mouth daily.      Marland Kitchen ammonium lactate (LAC-HYDRIN) 12 % lotion Apply 1 application topically 2  (two) times daily as needed for dry skin.    Marland Kitchen aspirin EC 81 MG tablet Take 1 tablet (81 mg total) by mouth daily. 90 tablet 3  . Calcium Carb-Cholecalciferol (CALCIUM-VITAMIN D) 500-200 MG-UNIT tablet Take 1 tablet by mouth daily.    . cholecalciferol (VITAMIN D) 1000 UNITS tablet Take 1,000 Units by mouth daily.    . clopidogrel (PLAVIX) 75 MG tablet Take 1 tablet (75 mg total) by mouth daily. 30 tablet 3  . cyclobenzaprine (FLEXERIL) 10 MG tablet Take 1 tablet (10 mg total) by mouth at bedtime. 30 tablet   . escitalopram (LEXAPRO) 10 MG tablet Take 1 tablet (10 mg total) by mouth 3 (three) times daily.    Marland Kitchen gabapentin (NEURONTIN) 300 MG capsule Take 600 mg by mouth 2 (two) times daily.    . hydrOXYzine (ATARAX) 50 MG tablet Take 50 mg by mouth 2 (two) times daily.     Marland Kitchen levothyroxine (SYNTHROID, LEVOTHROID) 75 MCG tablet Take 1 tablet (75 mcg total) by mouth daily before breakfast.    . mirtazapine (REMERON) 15 MG tablet Take 1 tablet (15 mg total) by mouth at bedtime. 30 tablet 2  . multivitamin (THERAGRAN) per tablet Take 1 tablet by mouth daily.      . Omega-3 Fatty Acids (FISH OIL) 1000 MG CAPS Take 1 capsule by mouth 2 (two) times daily.    Marland Kitchen omeprazole (PRILOSEC) 40 MG capsule Take 40 mg by mouth daily.    Marland Kitchen PRAVASTATIN SODIUM PO Take 40 mg by mouth daily.     . predniSONE (DELTASONE) 5 MG tablet Take 5 mg by mouth daily as needed.    . vitamin E 600 UNIT capsule Take 600 Units by mouth daily.      Marland Kitchen zolpidem (AMBIEN) 10 MG tablet Take 5 mg by mouth at bedtime.     Current Facility-Administered Medications  Medication Dose Route Frequency Provider Last Rate Last Dose  . 0.9 %  sodium chloride infusion  500 mL Intravenous Continuous Irene Shipper, MD        Allergies:   Neomycin-bacitracin zn-polymyx; Niacin; and Paroxetine    Social History:  The patient  reports that he has quit smoking. His smoking use included cigarettes. He started smoking about 7 months ago. he has never used  smokeless tobacco. He reports that he drinks alcohol. He reports that he uses drugs. Drug: Marijuana.   Family History:  The patient's family history includes Alzheimer's disease in his brother and sister; CAD in his father; Congestive Heart Failure in his mother; Diabetes in his brother; Lung cancer in his brother.    ROS:  Please see the history of present illness.   Otherwise, review of systems are positive for none.   All other systems are reviewed and negative.    PHYSICAL EXAM: VS:  BP (!) 146/82   Pulse 83   Ht 6\' 1"  (1.854 m)   Wt 171 lb 12.8 oz (  77.9 kg)   BMI 22.67 kg/m  , BMI Body mass index is 22.67 kg/m. GEN: Well nourished, well developed, in no acute distress  HEENT: normal  Neck: no JVD, carotid bruits, or masses Cardiac: RRR; no murmurs, rubs, or gallops,no edema  Respiratory:  clear to auscultation bilaterally, normal work of breathing GI: soft, nontender, nondistended, + BS MS: no deformity or atrophy  Skin: warm and dry, no rash Neuro:  Strength and sensation are intact Psych: euthymic mood, full affect Vascular: Posterior tibial is normal bilaterally.  Dorsalis pedis is palpable only on the right side.  EKG:  EKG is ordered today. The ekg ordered today demonstrates normal sinus rhythm with nonspecific T wave changes.   Recent Labs: 05/17/2016: ALT 20 07/01/2016: BUN 13; Creatinine, Ser 0.88; Hemoglobin 14.7; Platelets 156.0; Potassium 4.5; Sodium 140; TSH 2.07    Lipid Panel    Component Value Date/Time   CHOL  12/30/2009 0212    173        ATP III CLASSIFICATION:  <200     mg/dL   Desirable  200-239  mg/dL   Borderline High  >=240    mg/dL   High          TRIG 297 (H) 12/30/2009 0212   HDL 29 (L) 12/30/2009 0212   CHOLHDL 6.0 12/30/2009 0212   VLDL 59 (H) 12/30/2009 0212   LDLCALC  12/30/2009 0212    85        Total Cholesterol/HDL:CHD Risk Coronary Heart Disease Risk Table                     Men   Women  1/2 Average Risk   3.4   3.3   Average Risk       5.0   4.4  2 X Average Risk   9.6   7.1  3 X Average Risk  23.4   11.0        Use the calculated Patient Ratio above and the CHD Risk Table to determine the patient's CHD Risk.        ATP III CLASSIFICATION (LDL):  <100     mg/dL   Optimal  100-129  mg/dL   Near or Above                    Optimal  130-159  mg/dL   Borderline  160-189  mg/dL   High  >190     mg/dL   Very High      Wt Readings from Last 3 Encounters:  03/04/17 171 lb 12.8 oz (77.9 kg)  02/10/17 169 lb (76.7 kg)  12/09/16 169 lb 4 oz (76.8 kg)      No flowsheet data found.    ASSESSMENT AND PLAN:  1.  Peripheral arterial disease: The patient has no claudication or lower extremity ulceration.  I requested a follow-up lower extremity arterial Doppler.  Continue lifelong dual antiplatelet therapy.  2. Tobacco use: I congratulated him on smoking cessation.  3. Essential hypertension: Blood pressure is reasonably controlled on current medications.  4. Hyperlipidemia: Continue treatment with pravastatin with a target LDL of less than 70.  This is followed at the [place].    Disposition:   FU with me in 1 year  Signed,  Kathlyn Sacramento, MD  03/04/2017 9:37 AM    [place]

## 2017-03-04 NOTE — Patient Instructions (Addendum)
Medication Instructions:   NO CHANGE  Testing/Procedures:  Your physician has requested that you have a lower extremity arterial duplex. During this test, ultrasound are used to evaluate arterial blood flow in the legs. Allow one hour for this exam. There are no restrictions or special instructions.  Follow-Up:  Your physician wants you to follow-up in: [place] will receive a reminder letter in the mail two months in advance. If you don't receive a letter, please call our office to schedule the follow-up appointment.   If you need a refill on your cardiac medications before your next appointment, please call your pharmacy.

## 2017-03-06 NOTE — Addendum Note (Signed)
Addended by: Vennie Homans on: 03/06/2017 02:06 PM   Modules accepted: Orders

## 2017-03-24 ENCOUNTER — Ambulatory Visit (HOSPITAL_COMMUNITY)
Admission: RE | Admit: 2017-03-24 | Discharge: 2017-03-24 | Disposition: A | Discharge status: Home / Self Care | Payer: Medicare Other | Source: Ambulatory Visit | Attending: Internal Medicine | Admitting: Internal Medicine

## 2017-03-24 DIAGNOSIS — I739 Peripheral vascular disease, unspecified: Secondary | ICD-10-CM | POA: Diagnosis not present

## 2017-03-24 DIAGNOSIS — H1013 Acute atopic conjunctivitis, bilateral: Secondary | ICD-10-CM | POA: Diagnosis not present

## 2017-03-25 DIAGNOSIS — Z85828 Personal history of other malignant neoplasm of skin: Secondary | ICD-10-CM | POA: Diagnosis not present

## 2017-03-25 DIAGNOSIS — L309 Dermatitis, unspecified: Secondary | ICD-10-CM | POA: Diagnosis not present

## 2017-03-28 ENCOUNTER — Telehealth: Payer: Self-pay | Admitting: *Deleted

## 2017-03-28 DIAGNOSIS — I739 Peripheral vascular disease, unspecified: Secondary | ICD-10-CM

## 2017-03-28 NOTE — Telephone Encounter (Signed)
Patient made aware of results and verbalized his understanding. Orders placed for repeat dopplers in one year.

## 2017-03-28 NOTE — Telephone Encounter (Signed)
-----   Message from Wellington Hampshire, MD sent at 03/26/2017  2:23 PM EST ----- Inform patient that ABI was normal with patent stent in left leg. Repeat same studies in 1 year.

## 2017-03-31 DIAGNOSIS — H35342 Macular cyst, hole, or pseudohole, left eye: Secondary | ICD-10-CM | POA: Diagnosis not present

## 2017-04-16 ENCOUNTER — Encounter: Payer: Self-pay | Admitting: Adult Health

## 2017-04-16 ENCOUNTER — Observation Stay (HOSPITAL_COMMUNITY): Payer: Medicare Other

## 2017-04-16 ENCOUNTER — Encounter (HOSPITAL_COMMUNITY): Payer: Self-pay | Admitting: *Deleted

## 2017-04-16 ENCOUNTER — Telehealth: Payer: Self-pay | Admitting: Cardiovascular Disease

## 2017-04-16 ENCOUNTER — Emergency Department (HOSPITAL_COMMUNITY): Payer: Medicare Other

## 2017-04-16 ENCOUNTER — Observation Stay (HOSPITAL_COMMUNITY)
Admission: EM | Admit: 2017-04-16 | Discharge: 2017-04-17 | Disposition: A | Discharge status: Home / Self Care | Payer: Medicare Other | Attending: Internal Medicine | Admitting: Internal Medicine

## 2017-04-16 ENCOUNTER — Ambulatory Visit (INDEPENDENT_AMBULATORY_CARE_PROVIDER_SITE_OTHER): Payer: Medicare Other | Admitting: Adult Health

## 2017-04-16 ENCOUNTER — Other Ambulatory Visit: Payer: Self-pay

## 2017-04-16 VITALS — BP 140/82 | HR 70 | Ht 73.0 in | Wt 171.2 lb

## 2017-04-16 DIAGNOSIS — G459 Transient cerebral ischemic attack, unspecified: Secondary | ICD-10-CM | POA: Diagnosis not present

## 2017-04-16 DIAGNOSIS — Z7982 Long term (current) use of aspirin: Secondary | ICD-10-CM | POA: Insufficient documentation

## 2017-04-16 DIAGNOSIS — H538 Other visual disturbances: Secondary | ICD-10-CM | POA: Insufficient documentation

## 2017-04-16 DIAGNOSIS — I739 Peripheral vascular disease, unspecified: Secondary | ICD-10-CM | POA: Diagnosis present

## 2017-04-16 DIAGNOSIS — E039 Hypothyroidism, unspecified: Secondary | ICD-10-CM | POA: Insufficient documentation

## 2017-04-16 DIAGNOSIS — Z7902 Long term (current) use of antithrombotics/antiplatelets: Secondary | ICD-10-CM | POA: Diagnosis not present

## 2017-04-16 DIAGNOSIS — I749 Embolism and thrombosis of unspecified artery: Secondary | ICD-10-CM

## 2017-04-16 DIAGNOSIS — F1210 Cannabis abuse, uncomplicated: Secondary | ICD-10-CM | POA: Diagnosis not present

## 2017-04-16 DIAGNOSIS — N189 Chronic kidney disease, unspecified: Secondary | ICD-10-CM | POA: Diagnosis not present

## 2017-04-16 DIAGNOSIS — J449 Chronic obstructive pulmonary disease, unspecified: Secondary | ICD-10-CM | POA: Diagnosis present

## 2017-04-16 DIAGNOSIS — I6523 Occlusion and stenosis of bilateral carotid arteries: Secondary | ICD-10-CM | POA: Diagnosis not present

## 2017-04-16 DIAGNOSIS — R270 Ataxia, unspecified: Secondary | ICD-10-CM | POA: Insufficient documentation

## 2017-04-16 DIAGNOSIS — R51 Headache: Secondary | ICD-10-CM | POA: Diagnosis not present

## 2017-04-16 DIAGNOSIS — I10 Essential (primary) hypertension: Secondary | ICD-10-CM | POA: Diagnosis present

## 2017-04-16 DIAGNOSIS — R404 Transient alteration of awareness: Secondary | ICD-10-CM | POA: Diagnosis not present

## 2017-04-16 DIAGNOSIS — I129 Hypertensive chronic kidney disease with stage 1 through stage 4 chronic kidney disease, or unspecified chronic kidney disease: Secondary | ICD-10-CM | POA: Diagnosis not present

## 2017-04-16 DIAGNOSIS — Z79899 Other long term (current) drug therapy: Secondary | ICD-10-CM | POA: Insufficient documentation

## 2017-04-16 DIAGNOSIS — R55 Syncope and collapse: Secondary | ICD-10-CM

## 2017-04-16 DIAGNOSIS — R42 Dizziness and giddiness: Secondary | ICD-10-CM | POA: Diagnosis not present

## 2017-04-16 DIAGNOSIS — Z87891 Personal history of nicotine dependence: Secondary | ICD-10-CM | POA: Insufficient documentation

## 2017-04-16 DIAGNOSIS — I6789 Other cerebrovascular disease: Secondary | ICD-10-CM | POA: Diagnosis not present

## 2017-04-16 DIAGNOSIS — E785 Hyperlipidemia, unspecified: Secondary | ICD-10-CM | POA: Diagnosis present

## 2017-04-16 DIAGNOSIS — H8193 Unspecified disorder of vestibular function, bilateral: Secondary | ICD-10-CM | POA: Insufficient documentation

## 2017-04-16 LAB — DIFFERENTIAL
BASOS PCT: 0 %
Basophils Absolute: 0 10*3/uL (ref 0.0–0.1)
EOS ABS: 0 10*3/uL (ref 0.0–0.7)
Eosinophils Relative: 0 %
Lymphocytes Relative: 39 %
Lymphs Abs: 2.9 10*3/uL (ref 0.7–4.0)
MONO ABS: 0.4 10*3/uL (ref 0.1–1.0)
MONOS PCT: 5 %
Neutro Abs: 4.2 10*3/uL (ref 1.7–7.7)
Neutrophils Relative %: 56 %

## 2017-04-16 LAB — URINALYSIS, ROUTINE W REFLEX MICROSCOPIC
Bilirubin Urine: NEGATIVE
Glucose, UA: NEGATIVE mg/dL
HGB URINE DIPSTICK: NEGATIVE
Ketones, ur: NEGATIVE mg/dL
LEUKOCYTES UA: NEGATIVE
Nitrite: NEGATIVE
Protein, ur: NEGATIVE mg/dL
SPECIFIC GRAVITY, URINE: 1.01 (ref 1.005–1.030)
pH: 6 (ref 5.0–8.0)

## 2017-04-16 LAB — COMPREHENSIVE METABOLIC PANEL
ALT: 26 U/L (ref 17–63)
AST: 31 U/L (ref 15–41)
Albumin: 3.9 g/dL (ref 3.5–5.0)
Alkaline Phosphatase: 89 U/L (ref 38–126)
Anion gap: 9 (ref 5–15)
BILIRUBIN TOTAL: 0.4 mg/dL (ref 0.3–1.2)
BUN: 11 mg/dL (ref 6–20)
CHLORIDE: 107 mmol/L (ref 101–111)
CO2: 25 mmol/L (ref 22–32)
CREATININE: 0.96 mg/dL (ref 0.61–1.24)
Calcium: 9.3 mg/dL (ref 8.9–10.3)
Glucose, Bld: 90 mg/dL (ref 65–99)
POTASSIUM: 4.2 mmol/L (ref 3.5–5.1)
Sodium: 141 mmol/L (ref 135–145)
TOTAL PROTEIN: 7.3 g/dL (ref 6.5–8.1)

## 2017-04-16 LAB — CBC
HEMATOCRIT: 43.2 % (ref 39.0–52.0)
Hemoglobin: 14.4 g/dL (ref 13.0–17.0)
MCH: 30.3 pg (ref 26.0–34.0)
MCHC: 33.3 g/dL (ref 30.0–36.0)
MCV: 90.8 fL (ref 78.0–100.0)
Platelets: 149 10*3/uL — ABNORMAL LOW (ref 150–400)
RBC: 4.76 MIL/uL (ref 4.22–5.81)
RDW: 15.1 % (ref 11.5–15.5)
WBC: 7.5 10*3/uL (ref 4.0–10.5)

## 2017-04-16 LAB — RAPID URINE DRUG SCREEN, HOSP PERFORMED
Amphetamines: NOT DETECTED
BENZODIAZEPINES: NOT DETECTED
Barbiturates: NOT DETECTED
Cocaine: NOT DETECTED
Opiates: NOT DETECTED
Tetrahydrocannabinol: NOT DETECTED

## 2017-04-16 LAB — PROTIME-INR
INR: 0.97
Prothrombin Time: 12.8 seconds (ref 11.4–15.2)

## 2017-04-16 LAB — I-STAT CHEM 8, ED
BUN: 13 mg/dL (ref 6–20)
CALCIUM ION: 1.21 mmol/L (ref 1.15–1.40)
Chloride: 106 mmol/L (ref 101–111)
Creatinine, Ser: 0.9 mg/dL (ref 0.61–1.24)
GLUCOSE: 87 mg/dL (ref 65–99)
HCT: 45 % (ref 39.0–52.0)
HEMOGLOBIN: 15.3 g/dL (ref 13.0–17.0)
POTASSIUM: 4.6 mmol/L (ref 3.5–5.1)
Sodium: 144 mmol/L (ref 135–145)
TCO2: 27 mmol/L (ref 22–32)

## 2017-04-16 LAB — I-STAT TROPONIN, ED: TROPONIN I, POC: 0 ng/mL (ref 0.00–0.08)

## 2017-04-16 LAB — APTT: aPTT: 31 seconds (ref 24–36)

## 2017-04-16 LAB — ETHANOL: Alcohol, Ethyl (B): <10 mg/dL (ref <10)

## 2017-04-16 MED ORDER — STROKE: EARLY STAGES OF RECOVERY BOOK
Freq: Once | Status: DC
  Filled 2017-04-16: qty 1

## 2017-04-16 MED ORDER — HYDROXYZINE HCL 25 MG PO TABS
50.0000 mg | ORAL_TABLET | Freq: Two times a day (BID) | ORAL | Status: DC
  Administered 2017-04-17 (×2): 50 mg via ORAL
  Filled 2017-04-16 (×2): qty 2

## 2017-04-16 MED ORDER — ZOLPIDEM TARTRATE 5 MG PO TABS
5.0000 mg | ORAL_TABLET | Freq: Every day | ORAL | Status: DC
  Administered 2017-04-17: 5 mg via ORAL
  Filled 2017-04-16: qty 1

## 2017-04-16 MED ORDER — MULTIVITAMINS PO TABS: 1.0000 | ORAL_TABLET | Freq: Every day | ORAL | Status: DC

## 2017-04-16 MED ORDER — CYCLOBENZAPRINE HCL 10 MG PO TABS
10.0000 mg | ORAL_TABLET | Freq: Every day | ORAL | Status: DC
  Administered 2017-04-17: 10 mg via ORAL
  Filled 2017-04-16: qty 1

## 2017-04-16 MED ORDER — CALCIUM CARBONATE-VITAMIN D 500-200 MG-UNIT PO TABS
1.0000 | ORAL_TABLET | Freq: Every day | ORAL | Status: DC
  Administered 2017-04-17: 1 via ORAL
  Filled 2017-04-16 (×2): qty 1

## 2017-04-16 MED ORDER — ALBUTEROL SULFATE (2.5 MG/3ML) 0.083% IN NEBU: 3.0000 mL | INHALATION_SOLUTION | Freq: Four times a day (QID) | RESPIRATORY_TRACT | Status: DC | PRN

## 2017-04-16 MED ORDER — MIRTAZAPINE 15 MG PO TABS
15.0000 mg | ORAL_TABLET | Freq: Every day | ORAL | Status: DC
  Administered 2017-04-17: 15 mg via ORAL
  Filled 2017-04-16: qty 1

## 2017-04-16 MED ORDER — VITAMIN E 45 MG (100 UNIT) PO CAPS
600.0000 [IU] | ORAL_CAPSULE | Freq: Every day | ORAL | Status: DC
  Administered 2017-04-17: 10:00:00 600 [IU] via ORAL
  Filled 2017-04-16: qty 2

## 2017-04-16 MED ORDER — GABAPENTIN 300 MG PO CAPS
600.0000 mg | ORAL_CAPSULE | Freq: Two times a day (BID) | ORAL | Status: DC
  Administered 2017-04-17 (×2): 600 mg via ORAL
  Filled 2017-04-16 (×2): qty 2

## 2017-04-16 MED ORDER — ADULT MULTIVITAMIN W/MINERALS CH
1.0000 | ORAL_TABLET | Freq: Every day | ORAL | Status: DC
  Administered 2017-04-17: 1 via ORAL
  Filled 2017-04-16: qty 1

## 2017-04-16 MED ORDER — OLANZAPINE 5 MG PO TABS
5.0000 mg | ORAL_TABLET | Freq: Every day | ORAL | Status: DC
  Administered 2017-04-17: 5 mg via ORAL
  Filled 2017-04-16 (×2): qty 1

## 2017-04-16 MED ORDER — IOPAMIDOL (ISOVUE-370) INJECTION 76%
INTRAVENOUS | Status: AC
  Administered 2017-04-16: 50 mL
  Filled 2017-04-16: qty 100

## 2017-04-16 MED ORDER — CLOPIDOGREL BISULFATE 75 MG PO TABS
75.0000 mg | ORAL_TABLET | Freq: Every day | ORAL | Status: DC
  Administered 2017-04-17: 75 mg via ORAL
  Filled 2017-04-16: qty 1

## 2017-04-16 MED ORDER — VITAMIN D 1000 UNITS PO TABS
1000.0000 [IU] | ORAL_TABLET | Freq: Every day | ORAL | Status: DC
  Administered 2017-04-17: 1000 [IU] via ORAL
  Filled 2017-04-16: qty 1

## 2017-04-16 MED ORDER — ALFUZOSIN HCL ER 10 MG PO TB24
10.0000 mg | ORAL_TABLET | Freq: Every day | ORAL | Status: DC
  Administered 2017-04-17: 10 mg via ORAL
  Filled 2017-04-16 (×2): qty 1

## 2017-04-16 MED ORDER — PRAVASTATIN SODIUM 40 MG PO TABS
40.0000 mg | ORAL_TABLET | Freq: Every day | ORAL | Status: DC
  Administered 2017-04-17: 40 mg via ORAL
  Filled 2017-04-16: qty 1

## 2017-04-16 MED ORDER — ESCITALOPRAM OXALATE 10 MG PO TABS
30.0000 mg | ORAL_TABLET | Freq: Every day | ORAL | Status: DC
  Administered 2017-04-17: 30 mg via ORAL
  Filled 2017-04-16: qty 3

## 2017-04-16 MED ORDER — ACETAMINOPHEN 160 MG/5ML PO SOLN: 650.0000 mg | ORAL | Status: DC | PRN

## 2017-04-16 MED ORDER — ASPIRIN EC 81 MG PO TBEC
81.0000 mg | DELAYED_RELEASE_TABLET | Freq: Every day | ORAL | Status: DC
  Administered 2017-04-17: 81 mg via ORAL
  Filled 2017-04-16: qty 1

## 2017-04-16 MED ORDER — AMLODIPINE BESYLATE 5 MG PO TABS
5.0000 mg | ORAL_TABLET | Freq: Every day | ORAL | Status: DC
  Administered 2017-04-17: 5 mg via ORAL
  Filled 2017-04-16: qty 1

## 2017-04-16 MED ORDER — OMEGA-3-ACID ETHYL ESTERS 1 G PO CAPS
1000.0000 mg | ORAL_CAPSULE | Freq: Every day | ORAL | Status: DC
  Administered 2017-04-17: 1000 mg via ORAL
  Filled 2017-04-16: qty 1

## 2017-04-16 MED ORDER — ACETAMINOPHEN 650 MG RE SUPP: 650.0000 mg | RECTAL | Status: DC | PRN

## 2017-04-16 MED ORDER — ACETAMINOPHEN 325 MG PO TABS: 650.0000 mg | ORAL_TABLET | ORAL | Status: DC | PRN

## 2017-04-16 MED ORDER — LEVOTHYROXINE SODIUM 75 MCG PO TABS
75.0000 ug | ORAL_TABLET | Freq: Every day | ORAL | Status: DC
  Administered 2017-04-17: 75 ug via ORAL
  Filled 2017-04-16: qty 1

## 2017-04-16 NOTE — Progress Notes (Signed)
Cardiology Office Note   Date:  04/16/2017   ID:  David, David 1951-02-12, MRN [id number]  PCP:  [name], [name], MD  Cardiologist:   Chief Complaint  Patient presents with  . Dizziness    off balance, cramping in legs, hearing (echoing)     History of Present Illness: David David is a 67 y.o. male who presents as an add-on to my clinic after experiencing a near syncopal episode yesterday afternoon.  The patient has a history of peripheral arterial disease with a stent to the CFA to the popliteal artery and has been followed by Dr. Fletcher Anon, with recent office visit 03/04/2017.  The patient remains on dual antiplatelet therapy with aspirin and Plavix.  Other history includes coronary artery disease with nonobstructive CAD per cardiac catheterization 07/19/2014, most notable lesion was in the ostial first marginal to first marginal lesion of 60% stenosis hypercholesterolema. .   The patient stated that he was standing in line [employer] yesterday afternoon in [place], when he had sudden onset visual disturbances, audio disturbances , 'hearing an echo", weakness on his right side. ,Asl begin to walk he was listing to his right side, felt as if he may pass out, with some mild dizziness. He grabbed hold of a hole pole in the aisle for stability and stood  there were a few minutes until he felt that he could walk. The patient states that he lost his peripheral vision as well. He states he felt a headache.  Once he got his bearings,, he stated he walked slowly to his truck where he sat for about 10 minutes, until  feeling returned to on his right side,peri[pheral vision improved, and hearing was no longer abnormal. He went home and called our office to be seen today as an add-on to this episode.  Today he continues to have headache, as his peripheral vision has improved, his speech is a little slow with positive in between sentences. Has been medically compliant with his Dilantin  platelet therapy and statin therapy. He has no focal neurologic deficits at this time.  I have addressed this with Dr. [facility] DOD onsite. It is his recommendation that the patient be transported to the emergency room, started on heparin, with MRI and CT scan of the brain, with neurology consultation, and admission by Hospitalist service.  Past Medical History:  Diagnosis Date  . Alcoholism in family   . Anxiety   . Arthritis   . Depressive disorder, not elsewhere classified   . Diverticulosis of colon (without mention of hemorrhage)   . Dysfunction of eustachian tube   . Exposure to STD   . Family history of diabetes mellitus   . Family history of psychiatric condition    family Hx of suicide   . GERD (gastroesophageal reflux disease)   . Impotence of organic origin    erectile dysfunction  . Lipoprotein deficiencies   . Other diseases of lung, not elsewhere classified    pulmonary nodule  . Peripheral vascular disease, unspecified ([place])    lower extremity peripheral artery disease  . Personal history of colonic polyps   . Routine general medical examination at a health care facility   . Seizures ([place])   . Sprain of neck    Hx  . Thyroid disease   . Tobacco use disorder   . Unspecified hypertensive kidney disease with chronic kidney disease stage I through stage IV, or unspecified(403.90)    cholesterol embolization syndrome  Past Surgical History:  Procedure Laterality Date  . angiography     bilateral lower extremity angiography, stenting of the left.   Marland Kitchen CARDIAC CATHETERIZATION N/A 07/19/2014   Procedure: Left Heart Cath and Coronary Angiography;  Surgeon: Leonie Man, MD;  Location: [facility] INVASIVE CV LAB CUPID;  Service: Cardiovascular;  Laterality: N/A;  . CATARACT EXTRACTION Bilateral   . EYE SURGERY  2016   blown macular  . KNEE ARTHROSCOPY Left    x 2  . LOWER EXTREMITY ANGIOGRAM N/A 11/04/2012   Procedure: LOWER EXTREMITY ANGIOGRAM;   Surgeon: Wellington Hampshire, MD;  Location: [facility] CATH LAB;  Service: Cardiovascular;  Laterality: N/A;  . POPLITEAL ARTERY ANGIOPLASTY     didnt specify angioplasty or stent  . SPERMATOCELECTOMY Left    testicular cyst  . VASECTOMY       Current Outpatient Medications  Medication Sig Dispense Refill  . albuterol (PROVENTIL HFA;VENTOLIN HFA) 108 (90 Base) MCG/ACT inhaler Inhale 2 puffs into the lungs as needed for wheezing or shortness of breath.    Marland Kitchen amLODipine (NORVASC) 10 MG tablet Take 5 mg by mouth daily.      Marland Kitchen ammonium lactate (LAC-HYDRIN) 12 % lotion Apply 1 application topically 2 (two) times daily as needed for dry skin.    Marland Kitchen aspirin EC 81 MG tablet Take 1 tablet (81 mg total) by mouth daily. 90 tablet 3  . Calcium Carb-Cholecalciferol (CALCIUM-VITAMIN D) 500-200 MG-UNIT tablet Take 1 tablet by mouth daily.    . cholecalciferol (VITAMIN D) 1000 UNITS tablet Take 1,000 Units by mouth daily.    . clopidogrel (PLAVIX) 75 MG tablet Take 1 tablet (75 mg total) by mouth daily. 30 tablet 3  . cyclobenzaprine (FLEXERIL) 10 MG tablet Take 1 tablet (10 mg total) by mouth at bedtime. 30 tablet   . escitalopram (LEXAPRO) 10 MG tablet Take 1 tablet (10 mg total) by mouth 3 (three) times daily.    Marland Kitchen gabapentin (NEURONTIN) 300 MG capsule Take 600 mg by mouth 2 (two) times daily.    . hydrOXYzine (ATARAX) 50 MG tablet Take 50 mg by mouth 2 (two) times daily.     Marland Kitchen levothyroxine (SYNTHROID, LEVOTHROID) 75 MCG tablet Take 1 tablet (75 mcg total) by mouth daily before breakfast.    . mirtazapine (REMERON) 15 MG tablet Take 1 tablet (15 mg total) by mouth at bedtime. 30 tablet 2  . multivitamin (THERAGRAN) per tablet Take 1 tablet by mouth daily.      . Omega-3 Fatty Acids (FISH OIL) 1000 MG CAPS Take 1 capsule by mouth 2 (two) times daily.    Marland Kitchen omeprazole (PRILOSEC) 40 MG capsule Take 40 mg by mouth daily.    Marland Kitchen PRAVASTATIN SODIUM PO Take 40 mg by mouth daily.     . predniSONE (DELTASONE) 5 MG tablet  Take 5 mg by mouth daily as needed.    . vitamin E 600 UNIT capsule Take 600 Units by mouth daily.      Marland Kitchen zolpidem (AMBIEN) 10 MG tablet Take 5 mg by mouth at bedtime.     Current Facility-Administered Medications  Medication Dose Route Frequency Provider Last Rate Last Dose  . 0.9 %  sodium chloride infusion  500 mL Intravenous Continuous Irene Shipper, MD        Allergies:   Neomycin-bacitracin zn-polymyx; Niacin; and Paroxetine    Social History:  The patient  reports that he has quit smoking. His smoking use included cigarettes. He started smoking about 8  months ago. he has never used smokeless tobacco. He reports that he drinks alcohol. He reports that he uses drugs. Drug: Marijuana.   Family History:  The patient's family history includes Alzheimer's disease in his brother and sister; CAD in his father; Congestive Heart Failure in his mother; Diabetes in his brother; Lung cancer in his brother.    ROS: All other systems are reviewed and negative. Unless otherwise mentioned in H&P    PHYSICAL EXAM: VS:  BP 140/82   Pulse 70   Ht 6\' 1"  (1.854 m)   Wt 171 lb 3.2 oz (77.7 kg)   BMI 22.59 kg/m  , BMI Body mass index is 22.59 kg/m. GEN: Well nourished, well developed, in no acute distress  HEENT: normal  Neck: no JVD, carotid bruits, or masses Cardiac: RRR; no murmurs, rubs, or gallops,no edema  Respiratory:  clear to auscultation bilaterally, normal work of breathing GI: soft, nontender, nondistended, + BS MS: no deformity or atrophy  Skin: warm and dry, no rash Neuro:  Strength and sensation are intact. Speech is slow, no focal neural deficits. Psych: euthymic mood, full affect   EKG:  Normal sinus rhythm with PVCs, nonspecific T-wave abnormalities inferiorly.  Recent Labs: 05/17/2016: ALT 20 07/01/2016: BUN 13; Creatinine, Ser 0.88; Hemoglobin 14.7; Platelets 156.0; Potassium 4.5; Sodium 140; TSH 2.07    Lipid Panel    Component Value Date/Time   CHOL  12/30/2009  0212    173        ATP III CLASSIFICATION:  <200     mg/dL   Desirable  200-239  mg/dL   Borderline High  >=240    mg/dL   High          TRIG 297 (H) 12/30/2009 0212   HDL 29 (L) 12/30/2009 0212   CHOLHDL 6.0 12/30/2009 0212   VLDL 59 (H) 12/30/2009 0212   LDLCALC  12/30/2009 0212    85        Total Cholesterol/HDL:CHD Risk Coronary Heart Disease Risk Table                     Men   Women  1/2 Average Risk   3.4   3.3  Average Risk       5.0   4.4  2 X Average Risk   9.6   7.1  3 X Average Risk  23.4   11.0        Use the calculated Patient Ratio above and the CHD Risk Table to determine the patient's CHD Risk.        ATP III CLASSIFICATION (LDL):  <100     mg/dL   Optimal  100-129  mg/dL   Near or Above                    Optimal  130-159  mg/dL   Borderline  160-189  mg/dL   High  >190     mg/dL   Very High      Wt Readings from Last 3 Encounters:  04/16/17 171 lb 3.2 oz (77.7 kg)  03/04/17 171 lb 12.8 oz (77.9 kg)  02/10/17 169 lb (76.7 kg)      Other studies Reviewed: Cardiac Cath 07/19/2014 Conclusion   1. Angiographically moderate single-vessel CAD: Ost 1st Mrg to 1st Mrg lesion, 60% stenosed. 2. Normal LV size and function with normal EDP 3. Consider non-anginal cause for chest pain versus hypertension mediated diastolic dysfunction with moderate circumflex OM disease.  Patient admitted for signs symptoms concerning for unstable angina. No culprit lesion was seen besides a 60% OM lesion. This is unlikely the mediator of his resting symptoms. Also not to PTCA/PCI amenable - unless with cutting balloon which is unwise in a roughly 2 mm vessel.   Vascular Ultrasound Technologist Notes Duplex imaging, with color Doppler, of the left lower extremity reveals diffuse plaque in the tibial peroneal trunk. There is triphasic flow from the CFA through the tibial peroneal trunk, with normal velocity flow. Impressions Graft Native LEFT CIA EIA CFA DFA SFA  Origin SFA Mid SFA Distal Pop PT DP Per Anastomosis Graft Level 1 Graft Level 2 Graft Level 3 Distal CIA EIA CFA DFA SFA Origin SFA Mid SFA Distal Pop PT DP Per 129 90 113 135 80 80  ASSESSMENT AND PLAN:  1.  Probable TIA: Near-syncopal episode with right-sided weakness and listing when he is walking, body audio impairment and vision impairment with slow return of peripheral vision.continues to have headache. High probability pending CVA with these symptoms. He has been medically compliant with dual antiplatelet therapy.   This is been discussed with Dr. Gwenlyn Found, it is his recommendation that patient be taken to [facility] the ambulance to ER for neurologic workup  and heparin infusion. This is been discussed with the patient, he is initially reluctant to go to the hospital, however after speaking with his daughter, he is willing to be taken to ER. Ambulance has been called for transport. I've spoken with charge nurse at [facility] ER and given her report.  2. Peripheral artery disease: Prior stent. A popliteal artery, followed by Dr. Fletcher Anon continue dual antiplatelet therapy.  3. CAD: Nonobstructive per cardiac catheterization 5 2016.  4. Hypercholesterolemia: Continue statin therapy.  Current medicines are reviewed at length with the patient today.    Labs/ tests ordered today include: none  Phill Myron. West Pugh, ANP, AACC   04/16/2017 2:13 PM    [facility] [address] [place], [place] [address] Phone: [phone]; Fax: [phone]

## 2017-04-16 NOTE — H&P (Signed)
History and Physical    David David:[id number] DOB: June 26, 1950 DOA: 04/16/2017  PCP: [name], [name], MD  Patient coming from:   Home  Chief Complaint: Imbalance  HPI: David David is a 67 y.o. male with medical history significant of peripheral vascular disease comes in from his cardiology office for an episode yesterday that occurred where he got dizzy and felt foggy felt like he was going to pass out.  He says he became very unbalanced in his gait.  He walks to his vehicle and sat in his vehicle and within 15 minutes he was back to normal.  He never lost any consciousness.  He denies any recent illnesses no fevers.  No nausea vomiting diarrhea.  He denies any numbness tingling or weakness anywhere.  This episode occurred yesterday has not happened again.  He mentioned this in his MD office today and they sent him to the emergency department via ambulance.  He denies any chest pain shortness of breath or peripheral edema.  Patient is being referred for admission for possible TIA.   Review of Systems: As per HPI otherwise 10 point review of systems negative.   Past Medical History:  Diagnosis Date  . Alcoholism in family   . Anxiety   . Arthritis   . Depressive disorder, not elsewhere classified   . Diverticulosis of colon (without mention of hemorrhage)   . Dysfunction of eustachian tube   . Exposure to STD   . Family history of diabetes mellitus   . Family history of psychiatric condition    family Hx of suicide   . GERD (gastroesophageal reflux disease)   . Impotence of organic origin    erectile dysfunction  . Lipoprotein deficiencies   . Other diseases of lung, not elsewhere classified    pulmonary nodule  . Peripheral vascular disease, unspecified ([place])    lower extremity peripheral artery disease  . Personal history of colonic polyps   . Routine general medical examination at a health care facility   . Seizures ([place])   . Sprain of neck    Hx  . Thyroid  disease   . Tobacco use disorder   . Unspecified hypertensive kidney disease with chronic kidney disease stage I through stage IV, or unspecified(403.90)    cholesterol embolization syndrome    Past Surgical History:  Procedure Laterality Date  . angiography     bilateral lower extremity angiography, stenting of the left.   Marland Kitchen CARDIAC CATHETERIZATION N/A 07/19/2014   Procedure: Left Heart Cath and Coronary Angiography;  Surgeon: Leonie Man, MD;  Location: [facility] INVASIVE CV LAB CUPID;  Service: Cardiovascular;  Laterality: N/A;  . CATARACT EXTRACTION Bilateral   . EYE SURGERY  2016   blown macular  . KNEE ARTHROSCOPY Left    x 2  . LOWER EXTREMITY ANGIOGRAM N/A 11/04/2012   Procedure: LOWER EXTREMITY ANGIOGRAM;  Surgeon: Wellington Hampshire, MD;  Location: [place] CATH LAB;  Service: Cardiovascular;  Laterality: N/A;  . POPLITEAL ARTERY ANGIOPLASTY     didnt specify angioplasty or stent  . SPERMATOCELECTOMY Left    testicular cyst  . VASECTOMY       reports that he has quit smoking. His smoking use included cigarettes. He started smoking about 8 months ago. he has never used smokeless tobacco. He reports that he drinks alcohol. He reports that he uses drugs. Drug: Marijuana.  Allergies  Allergen Reactions  . Tape Other (See Comments)    "Plastic" tape TEARS THE  SKIN; please use paper tape or Coban wrap!!  . Neomycin-Bacitracin Zn-Polymyx Rash  . Niacin Rash  . Paroxetine Other (See Comments)    Makes the patient feel SEVERELY SEDATED/LETHARGIC/CANNOT SPEAK WORDS    Family History  Problem Relation Age of Onset  . CAD Father   . Congestive Heart Failure Mother   . Lung cancer Brother   . Alzheimer's disease Brother   . Diabetes Brother   . Alzheimer's disease Sister     Prior to Admission medications   Medication Sig Start Date End Date Taking? Authorizing Provider  albuterol (PROVENTIL HFA;VENTOLIN HFA) 108 (90 Base) MCG/ACT inhaler Inhale 2 puffs into the lungs as needed for  wheezing or shortness of breath.   Yes [provider]  alfuzosin (UROXATRAL) 10 MG 24 hr tablet Take 10 mg by mouth at bedtime. 03/24/17  Yes [provider]  amLODipine (NORVASC) 10 MG tablet Take 5 mg by mouth daily.     Yes [provider]  ammonium lactate (LAC-HYDRIN) 12 % lotion Apply 1 application topically 2 (two) times daily as needed for dry skin.   Yes [provider]  aspirin EC 81 MG tablet Take 1 tablet (81 mg total) by mouth daily. 10/09/12  Yes Sherren Mocha, MD  Calcium Carb-Cholecalciferol (CALCIUM-VITAMIN D) 500-200 MG-UNIT tablet Take 1 tablet by mouth daily.   Yes [provider]  cholecalciferol (VITAMIN D) 1000 UNITS tablet Take 1,000 Units by mouth daily.   Yes [provider]  clopidogrel (PLAVIX) 75 MG tablet Take 1 tablet (75 mg total) by mouth daily. 11/04/12  Yes Wellington Hampshire, MD  cyclobenzaprine (FLEXERIL) 10 MG tablet Take 1 tablet (10 mg total) by mouth at bedtime. 10/28/12  Yes Sherren Mocha, MD  escitalopram (LEXAPRO) 10 MG tablet Take 30 mg by mouth every morning.  10/28/12  Yes Sherren Mocha, MD  gabapentin (NEURONTIN) 300 MG capsule Take 600 mg by mouth 2 (two) times daily.   Yes [provider]  hydrOXYzine (ATARAX) 50 MG tablet Take 50 mg by mouth 2 (two) times daily.    Yes [provider]  levothyroxine (SYNTHROID, LEVOTHROID) 75 MCG tablet Take 1 tablet (75 mcg total) by mouth daily before breakfast. 10/28/12  Yes Sherren Mocha, MD  mirtazapine (REMERON) 15 MG tablet Take 1 tablet (15 mg total) by mouth at bedtime. 10/28/16  Yes [name], [name], MD  multivitamin [facility]) per tablet Take 1 tablet by mouth daily.     Yes [provider]  OLANZapine (ZYPREXA) 10 MG tablet Take 5 mg by mouth at bedtime.   Yes [provider]  Omega-3 Fatty Acids (FISH OIL) 1000 MG CAPS Take 1,000 mg by mouth 2 (two) times daily.    Yes [provider]  omeprazole  (PRILOSEC) 40 MG capsule Take 40 mg by mouth daily.   Yes [provider]  pravastatin (PRAVACHOL) 40 MG tablet Take 40 mg by mouth at bedtime.   Yes [provider]  vitamin E 600 UNIT capsule Take 600 Units by mouth daily.     Yes [provider]  zolpidem (AMBIEN) 10 MG tablet Take 5 mg by mouth at bedtime.   Yes [provider]    Physical Exam: Vitals:   04/16/17 1730 04/16/17 1800 04/16/17 1830 04/16/17 1900  BP: 127/83 132/77 134/74 135/78  Pulse: 63 66 65 67  Resp: 14 15 15 15   Temp:      TempSrc:      SpO2: 97% 96%  96% 94%  Weight:      Height:         Constitutional: NAD, calm, comfortable Vitals:   04/16/17 1730 04/16/17 1800 04/16/17 1830 04/16/17 1900  BP: 127/83 132/77 134/74 135/78  Pulse: 63 66 65 67  Resp: 14 15 15 15   Temp:      TempSrc:      SpO2: 97% 96% 96% 94%  Weight:      Height:       Eyes: PERRL, lids and conjunctivae normal ENMT: Mucous membranes are moist. Posterior pharynx clear of any exudate or lesions.Normal dentition.  Neck: normal, supple, no masses, no thyromegaly Respiratory: clear to auscultation bilaterally, no wheezing, no crackles. Normal respiratory effort. No accessory muscle use.  Cardiovascular: Regular rate and rhythm, no murmurs / rubs / gallops. No extremity edema. 2+ pedal pulses. No carotid bruits.  Abdomen: no tenderness, no masses palpated. No hepatosplenomegaly. Bowel sounds positive.  Musculoskeletal: no clubbing / cyanosis. No joint deformity upper and lower extremities. Good ROM, no contractures. Normal muscle tone.  Skin: no rashes, lesions, ulcers. No induration Neurologic: CN 2-12 grossly intact. Sensation intact, DTR normal. Strength 5/5 in all 4.  Psychiatric: Normal judgment and insight. Alert and oriented x 3. Normal mood.    Labs on Admission: I have personally reviewed following labs and imaging studies  CBC: Recent Labs  Lab 04/16/17 1618 04/16/17 1637  WBC 7.5  --     NEUTROABS 4.2  --   HGB 14.4 15.3  HCT 43.2 45.0  MCV 90.8  --   PLT 149*  --    Basic Metabolic Panel: Recent Labs  Lab 04/16/17 1618 04/16/17 1637  NA 141 144  K 4.2 4.6  CL 107 106  CO2 25  --   GLUCOSE 90 87  BUN 11 13  CREATININE 0.96 0.90  CALCIUM 9.3  --    GFR: Estimated Creatinine Clearance: 86.2 mL/min (by C-G formula based on SCr of 0.9 mg/dL). Liver Function Tests: Recent Labs  Lab 04/16/17 1618  AST 31  ALT 26  ALKPHOS 89  BILITOT 0.4  PROT 7.3  ALBUMIN 3.9   No results for input(s): LIPASE, AMYLASE in the last 168 hours. No results for input(s): AMMONIA in the last 168 hours. Coagulation Profile: Recent Labs  Lab 04/16/17 1618  INR 0.97   Cardiac Enzymes: No results for input(s): CKTOTAL, CKMB, CKMBINDEX, TROPONINI in the last 168 hours. BNP (last 3 results) No results for input(s): PROBNP in the last 8760 hours. HbA1C: No results for input(s): HGBA1C in the last 72 hours. CBG: No results for input(s): GLUCAP in the last 168 hours. Lipid Profile: No results for input(s): CHOL, HDL, LDLCALC, TRIG, CHOLHDL, LDLDIRECT in the last 72 hours. Thyroid Function Tests: No results for input(s): TSH, T4TOTAL, FREET4, T3FREE, THYROIDAB in the last 72 hours. Anemia Panel: No results for input(s): VITAMINB12, FOLATE, FERRITIN, TIBC, IRON, RETICCTPCT in the last 72 hours. Urine analysis:    Component Value Date/Time   COLORURINE YELLOW 04/16/2017 1752   APPEARANCEUR HAZY (A) 04/16/2017 1752   LABSPEC 1.010 04/16/2017 1752   PHURINE 6.0 04/16/2017 1752   GLUCOSEU NEGATIVE 04/16/2017 1752   HGBUR NEGATIVE 04/16/2017 1752   BILIRUBINUR NEGATIVE 04/16/2017 1752   KETONESUR NEGATIVE 04/16/2017 1752   PROTEINUR NEGATIVE 04/16/2017 1752   UROBILINOGEN 1.0 01/30/2014 2044   NITRITE NEGATIVE 04/16/2017 1752   LEUKOCYTESUR NEGATIVE 04/16/2017 1752   Sepsis Labs: !!!!!!!!!!!!!!!!!!!!!!!!!!!!!!!!!!!!!!!!!!!! @LABRCNTIP (procalcitonin:4,lacticidven:4) )No  results found for this or any  previous visit (from the past 240 hour(s)).   Radiological Exams on Admission: Ct Head Wo Contrast  Result Date: 04/16/2017 CLINICAL DATA:  Gait disturbance over the last 2 days. EXAM: CT HEAD WITHOUT CONTRAST TECHNIQUE: Contiguous axial images were obtained from the base of the skull through the vertex without intravenous contrast. COMPARISON:  MRI 04/13/2015.  CT 07/02/2006. FINDINGS: Brain: The brain shows a normal appearance without evidence of malformation, atrophy, old or acute small or large vessel infarction, mass lesion, hemorrhage, hydrocephalus or extra-axial collection. Vascular: There is atherosclerotic calcification of the major vessels at the base of the brain. Skull: Normal.  No traumatic finding.  No focal bone lesion. Sinuses/Orbits: Sinuses are clear. Orbits appear normal. Mastoids are clear. Other: None significant IMPRESSION: Normal head CT for age. Normal parenchymal appearance of the brain. Atherosclerotic calcification of the major vessels at the base of the brain, typical at this age. Electronically Signed   By: Nelson Chimes M.D.   On: 04/16/2017 17:50    EKG: Independently reviewed.  Normal sinus rhythm no acute changes Old chart reviewed Case discussed with EDP  Assessment/Plan 67 year old male with an episode of ataxia yesterday lasting 15 minutes and has resolved  Principal Problem:   TIA (transient ischemic attack)-continue Plavix and aspirin.  Obtain MRI brain.  CTA head neck ordered by neuro service.  Obtain cardiac echo.  Neurology consultation placed.  Frequent neurological checks.  Labs on telemetry. Active Problems:   Essential hypertension-stable   Hyperlipidemia-stable check fasting lipid panel in the morning   PAD (peripheral artery disease) (HCC)-stable   COPD (chronic obstructive pulmonary disease) (HCC) stable continue home meds-    DVT prophylaxis: SCDs Code Status: Full code Family Communication:  Daughters Disposition Plan: Per day team Consults called: Neurology Admission status: Observation   Marshall Roehrich A MD [facility]  If 7PM-7AM, please contact night-coverage [email or web address] Password [email or web address]  04/16/2017, 8:00 PM

## 2017-04-16 NOTE — ED Notes (Signed)
Report given to rn on 3  w 

## 2017-04-16 NOTE — ED Provider Notes (Signed)
[facility] EMERGENCY DEPARTMENT Provider Note   CSN: [id number] Arrival date & time: 04/16/17  1549     History   Chief Complaint Chief Complaint  Patient presents with  . Headache    HPI David David is a 67 y.o. male sent in by his cardiologist. The patient was at a hardware store yesterday. He had sudden onset of disequilibrium and blurry vision in both eyes. He was ataxic and had to hold on to a pallet of wood for approximately 20 min. He had a mild headache at the time. He denies other neurologic sxs, but feels that his speech is still slow.  He has a hx of smoking and PAD s/p Left illiac stent.  HPI  Past Medical History:  Diagnosis Date  . Alcoholism in family   . Anxiety   . Arthritis   . Depressive disorder, not elsewhere classified   . Diverticulosis of colon (without mention of hemorrhage)   . Dysfunction of eustachian tube   . Exposure to STD   . Family history of diabetes mellitus   . Family history of psychiatric condition    family Hx of suicide   . GERD (gastroesophageal reflux disease)   . Impotence of organic origin    erectile dysfunction  . Lipoprotein deficiencies   . Other diseases of lung, not elsewhere classified    pulmonary nodule  . Peripheral vascular disease, unspecified ([place])    lower extremity peripheral artery disease  . Personal history of colonic polyps   . Routine general medical examination at a health care facility   . Seizures ([place])   . Sprain of neck    Hx  . Thyroid disease   . Tobacco use disorder   . Unspecified hypertensive kidney disease with chronic kidney disease stage I through stage IV, or unspecified(403.90)    cholesterol embolization syndrome    Patient Active Problem List   Diagnosis Date Noted  . Arthritis of carpometacarpal [facility]) joint of left thumb 07/04/2016  . COPD (chronic obstructive pulmonary disease) ([place]) 07/01/2016  . Family history of first degree relative with dementia  03/30/2015  . Neuropathy 03/30/2015  . Memory loss 02/21/2015  . Right upper quadrant abdominal pain 08/09/2014  . Chest pain 07/19/2014  . Essential hypertension 07/19/2014  . Hyperlipidemia 07/19/2014  . Hypothyroidism 07/19/2014  . Malnutrition of moderate degree ([place]) 07/19/2014  . Unstable angina ([place])   . PAD (peripheral artery disease) ([place])   . TOBACCO ABUSE 09/27/2008  . CHOLESTEROL EMBOLIZATION SYNDROME 09/27/2008  . PERIPHERAL VASCULAR DISEASE 09/27/2008  . GERD 09/27/2008  . DYSFUNCTION, EUSTACHIAN TUBE 10/09/2006  . LOW HDL 07/28/2006  . Depression, major, in partial remission ([place]) 07/28/2006  . PULMONARY NODULE 07/28/2006  . DIVERTICULOSIS, COLON 07/28/2006  . ERECTILE DYSFUNCTION, ORGANIC 07/28/2006  . SPRAIN/STRAIN, NECK 07/28/2006  . COLONIC POLYPS, HX OF 07/28/2006    Past Surgical History:  Procedure Laterality Date  . angiography     bilateral lower extremity angiography, stenting of the left.   Marland Kitchen CARDIAC CATHETERIZATION N/A 07/19/2014   Procedure: Left Heart Cath and Coronary Angiography;  Surgeon: Leonie Man, MD;  Location: [facility] INVASIVE CV LAB CUPID;  Service: Cardiovascular;  Laterality: N/A;  . CATARACT EXTRACTION Bilateral   . EYE SURGERY  2016   blown macular  . KNEE ARTHROSCOPY Left    x 2  . LOWER EXTREMITY ANGIOGRAM N/A 11/04/2012   Procedure: LOWER EXTREMITY ANGIOGRAM;  Surgeon: Wellington Hampshire, MD;  Location: [facility]  CATH LAB;  Service: Cardiovascular;  Laterality: N/A;  . POPLITEAL ARTERY ANGIOPLASTY     didnt specify angioplasty or stent  . SPERMATOCELECTOMY Left    testicular cyst  . VASECTOMY         Home Medications    Prior to Admission medications   Medication Sig Start Date End Date Taking? Authorizing Provider  albuterol (PROVENTIL HFA;VENTOLIN HFA) 108 (90 Base) MCG/ACT inhaler Inhale 2 puffs into the lungs as needed for wheezing or shortness of breath.   Yes [provider]  alfuzosin (UROXATRAL) 10 MG 24 hr tablet  Take 10 mg by mouth at bedtime. 03/24/17  Yes [provider]  amLODipine (NORVASC) 10 MG tablet Take 5 mg by mouth daily.     Yes [provider]  ammonium lactate (LAC-HYDRIN) 12 % lotion Apply 1 application topically 2 (two) times daily as needed for dry skin.   Yes [provider]  aspirin EC 81 MG tablet Take 1 tablet (81 mg total) by mouth daily. 10/09/12  Yes Sherren Mocha, MD  Calcium Carb-Cholecalciferol (CALCIUM-VITAMIN D) 500-200 MG-UNIT tablet Take 1 tablet by mouth daily.   Yes [provider]  cholecalciferol (VITAMIN D) 1000 UNITS tablet Take 1,000 Units by mouth daily.   Yes [provider]  clopidogrel (PLAVIX) 75 MG tablet Take 1 tablet (75 mg total) by mouth daily. 11/04/12  Yes Wellington Hampshire, MD  cyclobenzaprine (FLEXERIL) 10 MG tablet Take 1 tablet (10 mg total) by mouth at bedtime. 10/28/12  Yes Sherren Mocha, MD  escitalopram (LEXAPRO) 10 MG tablet Take 30 mg by mouth every morning.  10/28/12  Yes Sherren Mocha, MD  gabapentin (NEURONTIN) 300 MG capsule Take 600 mg by mouth 2 (two) times daily.   Yes [provider]  hydrOXYzine (ATARAX) 50 MG tablet Take 50 mg by mouth 2 (two) times daily.    Yes [provider]  levothyroxine (SYNTHROID, LEVOTHROID) 75 MCG tablet Take 1 tablet (75 mcg total) by mouth daily before breakfast. 10/28/12  Yes Sherren Mocha, MD  mirtazapine (REMERON) 15 MG tablet Take 1 tablet (15 mg total) by mouth at bedtime. 10/28/16  Yes [name], [name], MD  multivitamin [facility]) per tablet Take 1 tablet by mouth daily.     Yes [provider]  OLANZapine (ZYPREXA) 10 MG tablet Take 5 mg by mouth at bedtime.   Yes [provider]  Omega-3 Fatty Acids (FISH OIL) 1000 MG CAPS Take 1,000 mg by mouth 2 (two) times daily.    Yes [provider]  omeprazole (PRILOSEC) 40 MG capsule Take 40 mg by mouth daily.   Yes [provider]  pravastatin (PRAVACHOL) 40 MG  tablet Take 40 mg by mouth at bedtime.   Yes [provider]  vitamin E 600 UNIT capsule Take 600 Units by mouth daily.     Yes [provider]  zolpidem (AMBIEN) 10 MG tablet Take 5 mg by mouth at bedtime.   Yes [provider]    Family History Family History  Problem Relation Age of Onset  . CAD Father   . Congestive Heart Failure Mother   . Lung cancer Brother   . Alzheimer's disease Brother   . Diabetes Brother   . Alzheimer's disease Sister     Social History Social History   Tobacco Use  . Smoking status: Former Smoker    Types: Cigarettes    Start date: 07/22/2016  . Smokeless tobacco: Never Used  . Tobacco comment:  QUIT 2009  Substance Use Topics  . Alcohol use: Yes    Alcohol/week: 0.0 oz    Comment: social  . Drug use: Yes    Types: Marijuana     Allergies   Tape; Neomycin-bacitracin zn-polymyx; Niacin; and Paroxetine   Review of Systems Review of Systems  Ten systems reviewed and are negative for acute change, except as noted in the HPI.   Physical Exam Updated Vital Signs BP 127/83   Pulse 63   Temp (!) 97 F (36.1 C) (Oral)   Resp 14   Ht 6\' 1"  (1.854 m)   Wt 77.6 kg (171 lb)   SpO2 97%   BMI 22.56 kg/m   Physical Exam  Constitutional: He is oriented to person, place, and time. He appears well-developed and well-nourished. No distress.  HENT:  Head: Normocephalic and atraumatic.  Eyes: Conjunctivae are normal. No scleral icterus.  Neck: Normal range of motion. Neck supple.  Cardiovascular: Normal rate, regular rhythm and normal heart sounds.  Pulmonary/Chest: Effort normal and breath sounds normal. No respiratory distress.  Abdominal: Soft. There is no tenderness.  Musculoskeletal: He exhibits no edema.  Neurological: He is alert and oriented to person, place, and time.  Speech is clear and goal oriented, follows commands Major Cranial nerves without deficit, no facial droop Normal strength in upper and lower  extremities bilaterally including dorsiflexion and plantar flexion, strong and equal grip strength Sensation normal to light and sharp touch Moves extremities without ataxia, coordination intact Normal finger to nose and rapid alternating movements Neg romberg, no pronator drift Normal gait Normal heel-shin and balance   Skin: Skin is warm and dry. He is not diaphoretic.  Psychiatric: His behavior is normal.  Nursing note and vitals reviewed.    ED Treatments / Results  Labs (all labs ordered are listed, but only abnormal results are displayed) Labs Reviewed  CBC - Abnormal; Notable for the following components:      Result Value   Platelets 149 (*)    All other components within normal limits  URINALYSIS, ROUTINE W REFLEX MICROSCOPIC - Abnormal; Notable for the following components:   APPearance HAZY (*)    All other components within normal limits  ETHANOL  PROTIME-INR  APTT  DIFFERENTIAL  COMPREHENSIVE METABOLIC PANEL  RAPID URINE DRUG SCREEN, HOSP PERFORMED  I-STAT CHEM 8, ED  I-STAT TROPONIN, ED    EKG  EKG Interpretation  Date/Time:  Wednesday April 16 2017 15:54:23 EST Ventricular Rate:  74 PR Interval:    QRS Duration: 83 QT Interval:  403 QTC Calculation: 448 R Axis:   49 Text Interpretation:  Sinus rhythm No significant change since last tracing Confirmed by Dorie Rank [phone]) on 04/16/2017 3:58:52 PM Also confirmed by Dorie Rank [phone]), editor Lynder Parents [phone])  on 04/16/2017 4:40:53 PM       Radiology Ct Head Wo Contrast  Result Date: 04/16/2017 CLINICAL DATA:  Gait disturbance over the last 2 days. EXAM: CT HEAD WITHOUT CONTRAST TECHNIQUE: Contiguous axial images were obtained from the base of the skull through the vertex without intravenous contrast. COMPARISON:  MRI 04/13/2015.  CT 07/02/2006. FINDINGS: Brain: The brain shows a normal appearance without evidence of malformation, atrophy, old or acute small or large vessel infarction, mass  lesion, hemorrhage, hydrocephalus or extra-axial collection. Vascular: There is atherosclerotic calcification of the major vessels at the base of the brain. Skull: Normal.  No traumatic finding.  No focal bone lesion. Sinuses/Orbits: Sinuses are clear. Orbits appear normal. Mastoids  are clear. Other: None significant IMPRESSION: Normal head CT for age. Normal parenchymal appearance of the brain. Atherosclerotic calcification of the major vessels at the base of the brain, typical at this age. Electronically Signed   By: Nelson Chimes M.D.   On: 04/16/2017 17:50    Procedures Procedures (including critical care time)  Medications Ordered in ED Medications - No data to display   Initial Impression / Assessment and Plan / ED Course  I have reviewed the triage vital signs and the nursing notes.  Pertinent labs & imaging results that were available during my care of the patient were reviewed by me and considered in my medical decision making (see chart for details).  Clinical Course as of Apr 17 1847  Wed Apr 16, 2017  1811 Patient with negative CT head. I have reviewed CT and EKG which are without sig abnormality. MRI pending. Patient will need admission for TIA workup.   [AH]  [id number] I have spoken with Dr. Lorraine Lax. He asks that the patient have a CTA head and Neck. The patient will be admitted for TIA work up.  [AH]    Clinical Course User Index [AH] Margarita Mail, PA-C     Final Clinical Impressions(s) / ED Diagnoses   Final diagnoses:  TIA (transient ischemic attack)    ED Discharge Orders    None       Margarita Mail, PA-C 04/16/17 1851    Dorie Rank, MD 04/16/17 2247

## 2017-04-16 NOTE — Telephone Encounter (Signed)
Returned the call to the patient. He stated that he was walking in [place] yesterday and had a dizzy spell where he felt like he was being pulled to the right. He does not check his blood pressure or heart rate at home. He is concerned that this may be a cardiac concern. He has a history of previous stents. Appointment made for today at 2 with Jory Sims, DNP per his request.

## 2017-04-16 NOTE — ED Notes (Signed)
Pt passed the swallow screen 

## 2017-04-16 NOTE — ED Notes (Signed)
HEART HEALTHY TRAY ORDERED

## 2017-04-16 NOTE — ED Notes (Addendum)
Pt to mri 

## 2017-04-16 NOTE — ED Notes (Signed)
Pt c/o being hungry  Dinner tray requested

## 2017-04-16 NOTE — ED Notes (Signed)
Unsuccessful attempt to give report nurse will call me back

## 2017-04-16 NOTE — ED Notes (Signed)
Delay for mri explained to the pt

## 2017-04-16 NOTE — Telephone Encounter (Signed)
New message   STAT if patient feels like he/she is going to faint   1) Are you dizzy now? no  2) Do you feel faint or have you passed out? Yes, but haven't passed out  3) Do you have any other symptoms? no  4) Have you checked your HR and BP (record if available)? no

## 2017-04-16 NOTE — Patient Instructions (Signed)
DIRECT ADMIT FOR SUSPECTED TIA-911 CALLED

## 2017-04-16 NOTE — ED Triage Notes (Signed)
The pt arrived by gems from dr lawrences officeyesterday he had an episode of not being able to go in the direction of where he wanted to go and had a headache this lasted for 15 minutes  His headache stayed until he took a bc  He had a similar episode 6 months ago.  Did not see a doctor then.   At present no headache no symptoms just does noty feel well  Moves all extremities

## 2017-04-17 ENCOUNTER — Observation Stay (HOSPITAL_COMMUNITY): Payer: Medicare Other

## 2017-04-17 ENCOUNTER — Observation Stay (HOSPITAL_BASED_OUTPATIENT_CLINIC_OR_DEPARTMENT_OTHER): Payer: Medicare Other

## 2017-04-17 DIAGNOSIS — I739 Peripheral vascular disease, unspecified: Secondary | ICD-10-CM

## 2017-04-17 DIAGNOSIS — I361 Nonrheumatic tricuspid (valve) insufficiency: Secondary | ICD-10-CM

## 2017-04-17 DIAGNOSIS — R55 Syncope and collapse: Secondary | ICD-10-CM | POA: Diagnosis not present

## 2017-04-17 DIAGNOSIS — G459 Transient cerebral ischemic attack, unspecified: Secondary | ICD-10-CM

## 2017-04-17 DIAGNOSIS — I10 Essential (primary) hypertension: Secondary | ICD-10-CM

## 2017-04-17 DIAGNOSIS — E785 Hyperlipidemia, unspecified: Secondary | ICD-10-CM

## 2017-04-17 LAB — ECHOCARDIOGRAM COMPLETE
HEIGHTINCHES: 73 in
WEIGHTICAEL: 2687.85 [oz_av]

## 2017-04-17 LAB — LIPID PANEL
Cholesterol: 126 mg/dL (ref 0–200)
HDL: 36 mg/dL — AB (ref >40)
LDL CALC: 60 mg/dL (ref 0–99)
Total CHOL/HDL Ratio: 3.5 RATIO
Triglycerides: 150 mg/dL — ABNORMAL HIGH (ref <150)
VLDL: 30 mg/dL (ref 0–40)

## 2017-04-17 LAB — HEMOGLOBIN A1C
HEMOGLOBIN A1C: 6.1 % — AB (ref 4.8–5.6)
MEAN PLASMA GLUCOSE: 128.37 mg/dL

## 2017-04-17 NOTE — Consult Note (Signed)
Referring Physician: Dr. Shanon Brow    Chief Complaint: Dizziness  HPI: David David is an 67 y.o. male who presented to the ED for evaluation of a dizzy spell during which he felt like he was being pulled to the right. The spell occurred on Tuesday while he was at a home improvement store. He saw his cardiologist on Wednesday who felt that the near-syncopal episode with right-sided weakness and listing when he was walking, in conjunction with vision impairment with slow return of peripheral vision, was most likely due to a TIA. The patient was sent to the [facility] ED for Neurology work up and heparin infusion.   Additional information from the Cardiologist's note: "The patient stated that he was standing in line [employer] store yesterday afternoon in [place], when he had sudden onset visual disturbances, audio disturbances , 'hearing an echo", weakness on his right side. ,Asl begin to walk he was listing to his right side, felt as if he may pass out, with some mild dizziness. He grabbed hold of a hole pole in the aisle for stability and stood  there were a few minutes until he felt that he could walk. The patient states that he lost his peripheral vision as well. He states he felt a headache. Once he got his bearings,, he stated he walked slowly to his truck where he sat for about 10 minutes, until  feeling returned to on his right side,peri[pheral vision improved, and hearing was no longer abnormal. He went home and called our office to be seen today as an add-on to this episode. Today he continues to have headache, as his peripheral vision has improved, his speech is a little slow with positive in between sentences."  The patient has been on dual antiplatelet therapy with aspirin and Plavix for peripheral arterial disease, s/p stent to the popliteal artery. He also has CAD and hypercholesterolemia.      PMHx also includes seizures, hypertensive kidney disease, cholesterol embolization syndrome and  thyroid disease.   Home medications include Neurontin 600 mg BID.   Past Medical History:  Diagnosis Date  . Alcoholism in family   . Anxiety   . Arthritis   . Depressive disorder, not elsewhere classified   . Diverticulosis of colon (without mention of hemorrhage)   . Dysfunction of eustachian tube   . Exposure to STD   . Family history of diabetes mellitus   . Family history of psychiatric condition    family Hx of suicide   . GERD (gastroesophageal reflux disease)   . Impotence of organic origin    erectile dysfunction  . Lipoprotein deficiencies   . Other diseases of lung, not elsewhere classified    pulmonary nodule  . Peripheral vascular disease, unspecified ([place])    lower extremity peripheral artery disease  . Personal history of colonic polyps   . Routine general medical examination at a health care facility   . Seizures ([place])   . Sprain of neck    Hx  . Thyroid disease   . Tobacco use disorder   . Unspecified hypertensive kidney disease with chronic kidney disease stage I through stage IV, or unspecified(403.90)    cholesterol embolization syndrome    Past Surgical History:  Procedure Laterality Date  . angiography     bilateral lower extremity angiography, stenting of the left.   Marland Kitchen CARDIAC CATHETERIZATION N/A 07/19/2014   Procedure: Left Heart Cath and Coronary Angiography;  Surgeon: Leonie Man, MD;  Location: [place] [facility]  LAB CUPID;  Service: Cardiovascular;  Laterality: N/A;  . CATARACT EXTRACTION Bilateral   . EYE SURGERY  2016   blown macular  . KNEE ARTHROSCOPY Left    x 2  . LOWER EXTREMITY ANGIOGRAM N/A 11/04/2012   Procedure: LOWER EXTREMITY ANGIOGRAM;  Surgeon: Wellington Hampshire, MD;  Location: [place] CATH LAB;  Service: Cardiovascular;  Laterality: N/A;  . POPLITEAL ARTERY ANGIOPLASTY     didnt specify angioplasty or stent  . SPERMATOCELECTOMY Left    testicular cyst  . VASECTOMY      Family History  Problem Relation Age of Onset  . CAD  Father   . Congestive Heart Failure Mother   . Lung cancer Brother   . Alzheimer's disease Brother   . Diabetes Brother   . Alzheimer's disease Sister    Social History:  reports that he has quit smoking. His smoking use included cigarettes. He started smoking about 8 months ago. he has never used smokeless tobacco. He reports that he drinks alcohol. He reports that he uses drugs. Drug: Marijuana.  Allergies:  Allergies  Allergen Reactions  . Tape Other (See Comments)    "Plastic" tape TEARS THE SKIN; please use paper tape or Coban wrap!!  . Neomycin-Bacitracin Zn-Polymyx Rash  . Niacin Rash  . Paroxetine Other (See Comments)    Makes the patient feel SEVERELY SEDATED/LETHARGIC/CANNOT SPEAK WORDS    Medications:  Prior to Admission:  Facility-Administered Medications Prior to Admission  Medication Dose Route Frequency Provider Last Rate Last Dose  . 0.9 %  sodium chloride infusion  500 mL Intravenous Continuous Irene Shipper, MD       Medications Prior to Admission  Medication Sig Dispense Refill Last Dose  . albuterol (PROVENTIL HFA;VENTOLIN HFA) 108 (90 Base) MCG/ACT inhaler Inhale 2 puffs into the lungs as needed for wheezing or shortness of breath.   Unk at [name]  . alfuzosin (UROXATRAL) 10 MG 24 hr tablet Take 10 mg by mouth at bedtime.  11 04/15/2017 at pm  . amLODipine (NORVASC) 10 MG tablet Take 5 mg by mouth daily.     04/16/2017 at am  . ammonium lactate (LAC-HYDRIN) 12 % lotion Apply 1 application topically 2 (two) times daily as needed for dry skin.   Unk at [facility]  . aspirin EC 81 MG tablet Take 1 tablet (81 mg total) by mouth daily. 90 tablet 3 04/16/2017 at 0830  . Calcium Carb-Cholecalciferol (CALCIUM-VITAMIN D) 500-200 MG-UNIT tablet Take 1 tablet by mouth daily.   04/16/2017 at Unknown time  . cholecalciferol (VITAMIN D) 1000 UNITS tablet Take 1,000 Units by mouth daily.   04/16/2017 at Unknown time  . clopidogrel (PLAVIX) 75 MG tablet Take 1 tablet (75 mg total) by mouth  daily. 30 tablet 3 04/16/2017 at 0830  . cyclobenzaprine (FLEXERIL) 10 MG tablet Take 1 tablet (10 mg total) by mouth at bedtime. 30 tablet  04/15/2017 at pm  . escitalopram (LEXAPRO) 10 MG tablet Take 30 mg by mouth every morning.    04/16/2017 at am  . gabapentin (NEURONTIN) 300 MG capsule Take 600 mg by mouth 2 (two) times daily.   04/16/2017 at am  . hydrOXYzine (ATARAX) 50 MG tablet Take 50 mg by mouth 2 (two) times daily.    04/16/2017 at am  . levothyroxine (SYNTHROID, LEVOTHROID) 75 MCG tablet Take 1 tablet (75 mcg total) by mouth daily before breakfast.   04/16/2017 at am  . mirtazapine (REMERON) 15 MG tablet Take 1 tablet (15 mg total)  by mouth at bedtime. 30 tablet 2 04/15/2017 at pm  . multivitamin (THERAGRAN) per tablet Take 1 tablet by mouth daily.     04/16/2017 at Unknown time  . OLANZapine (ZYPREXA) 10 MG tablet Take 5 mg by mouth at bedtime.   04/15/2017 at pm  . Omega-3 Fatty Acids (FISH OIL) 1000 MG CAPS Take 1,000 mg by mouth 2 (two) times daily.    04/16/2017 at am  . omeprazole (PRILOSEC) 40 MG capsule Take 40 mg by mouth daily.   04/16/2017 at am  . pravastatin (PRAVACHOL) 40 MG tablet Take 40 mg by mouth at bedtime.   04/15/2017 at pm  . vitamin E 600 UNIT capsule Take 600 Units by mouth daily.     04/16/2017 at Unknown time  . zolpidem (AMBIEN) 10 MG tablet Take 5 mg by mouth at bedtime.   04/15/2017 at pm   Scheduled: .  stroke: mapping our early stages of recovery book   Does not apply Once  . alfuzosin  10 mg Oral QHS  . amLODipine  5 mg Oral Daily  . aspirin EC  81 mg Oral Daily  . calcium-vitamin D  1 tablet Oral Daily  . cholecalciferol  1,000 Units Oral Daily  . clopidogrel  75 mg Oral Daily  . cyclobenzaprine  10 mg Oral QHS  . escitalopram  30 mg Oral Daily  . gabapentin  600 mg Oral BID  . hydrOXYzine  50 mg Oral BID  . levothyroxine  75 mcg Oral QAC breakfast  . mirtazapine  15 mg Oral QHS  . multivitamin with minerals  1 tablet Oral Daily  . OLANZapine  5 mg Oral  QHS  . omega-3 acid ethyl esters  1,000 mg Oral Daily  . pravastatin  40 mg Oral QHS  . vitamin E  600 Units Oral Daily  . zolpidem  5 mg Oral QHS   Continuous:  HQI:[id number] **OR** acetaminophen (TYLENOL) oral liquid 160 mg/5 mL **OR** acetaminophen, albuterol  ROS: Dull persisting right occipital headache. Other ROS as per HPI.   Physical Examination: Blood pressure (!) 116/57, pulse 68, temperature 97.7 F (36.5 C), temperature source Oral, resp. rate 18, height '6\' 1"'  (1.854 m), weight 76.2 kg (167 lb 15.9 oz), SpO2 96 %.  HEENT: [place]/AT Lungs: Respirations unlabored Ext: No edema  Neurologic Examination: Mental Status: Alert, oriented, thought content appropriate.  Speech fluent without evidence of aphasia.  Able to follow all commands without difficulty. Cranial Nerves: II:  Visual fields intact. PERRL.  III,IV, VI: ptosis not present, EOMI, no nystagmus V,VII: Smile symmetric, facial temp sensation decreased left V2 VIII: Hearing intact to conversation IX,X: No hypophonia XI: Symmetric XII: midline tongue extension  Motor: Right : Upper extremity   5/5    Left:     Upper extremity   5/5  Lower extremity   5/5     Lower extremity   5/5 Normal tone throughout; no atrophy noted Sensory: Temp and light touch intact proximally x 4. One instance of extinction to DSS, left arm Deep Tendon Reflexes:  2+ bilateral brachiordialis and biceps 3+ bilateral patellae, 2+ achilles Cerebellar: No ataxia with FNF or heel-shin bilaterally Gait: Deferred  Results for orders placed or performed during the hospital encounter of 04/16/17 (from the past 48 hour(s))  Ethanol     Status: None   Collection Time: 04/16/17  4:18 PM  Result Value Ref Range   Alcohol, Ethyl (B) <10 <10 mg/dL    Comment:  LOWEST DETECTABLE LIMIT FOR SERUM ALCOHOL IS 10 mg/dL FOR MEDICAL PURPOSES ONLY   Protime-INR     Status: None   Collection Time: 04/16/17  4:18 PM  Result Value Ref Range    Prothrombin Time 12.8 11.4 - 15.2 seconds   INR 0.97   APTT     Status: None   Collection Time: 04/16/17  4:18 PM  Result Value Ref Range   aPTT 31 24 - 36 seconds  CBC     Status: Abnormal   Collection Time: 04/16/17  4:18 PM  Result Value Ref Range   WBC 7.5 4.0 - 10.5 K/uL   RBC 4.76 4.22 - 5.81 MIL/uL   Hemoglobin 14.4 13.0 - 17.0 g/dL   HCT 43.2 39.0 - 52.0 %   MCV 90.8 78.0 - 100.0 fL   MCH 30.3 26.0 - 34.0 pg   MCHC 33.3 30.0 - 36.0 g/dL   RDW 15.1 11.5 - 15.5 %   Platelets 149 (L) 150 - 400 K/uL  Differential     Status: None   Collection Time: 04/16/17  4:18 PM  Result Value Ref Range   Neutrophils Relative % 56 %   Neutro Abs 4.2 1.7 - 7.7 K/uL   Lymphocytes Relative 39 %   Lymphs Abs 2.9 0.7 - 4.0 K/uL   Monocytes Relative 5 %   Monocytes Absolute 0.4 0.1 - 1.0 K/uL   Eosinophils Relative 0 %   Eosinophils Absolute 0.0 0.0 - 0.7 K/uL   Basophils Relative 0 %   Basophils Absolute 0.0 0.0 - 0.1 K/uL  Comprehensive metabolic panel     Status: None   Collection Time: 04/16/17  4:18 PM  Result Value Ref Range   Sodium 141 135 - 145 mmol/L   Potassium 4.2 3.5 - 5.1 mmol/L   Chloride 107 101 - 111 mmol/L   CO2 25 22 - 32 mmol/L   Glucose, Bld 90 65 - 99 mg/dL   BUN 11 6 - 20 mg/dL   Creatinine, Ser 0.96 0.61 - 1.24 mg/dL   Calcium 9.3 8.9 - 10.3 mg/dL   Total Protein 7.3 6.5 - 8.1 g/dL   Albumin 3.9 3.5 - 5.0 g/dL   AST 31 15 - 41 U/L   ALT 26 17 - 63 U/L   Alkaline Phosphatase 89 38 - 126 U/L   Total Bilirubin 0.4 0.3 - 1.2 mg/dL   GFR calc non Af Amer >60 >60 mL/min   GFR calc Af Amer >60 >60 mL/min    Comment: (NOTE) The eGFR has been calculated using the CKD EPI equation. This calculation has not been validated in all clinical situations. eGFR's persistently <60 mL/min signify possible Chronic Kidney Disease.    Anion gap 9 5 - 15  I-stat troponin, ED     Status: None   Collection Time: 04/16/17  4:36 PM  Result Value Ref Range   Troponin i, poc  0.00 0.00 - 0.08 ng/mL   Comment 3            Comment: Due to the release kinetics of cTnI, a negative result within the first hours of the onset of symptoms does not rule out myocardial infarction with certainty. If myocardial infarction is still suspected, repeat the test at appropriate intervals.   I-Stat Chem 8, ED     Status: None   Collection Time: 04/16/17  4:37 PM  Result Value Ref Range   Sodium 144 135 - 145 mmol/L   Potassium 4.6 3.5 -  5.1 mmol/L   Chloride 106 101 - 111 mmol/L   BUN 13 6 - 20 mg/dL   Creatinine, Ser 0.90 0.61 - 1.24 mg/dL   Glucose, Bld 87 65 - 99 mg/dL   Calcium, Ion 1.21 1.15 - 1.40 mmol/L   TCO2 27 22 - 32 mmol/L   Hemoglobin 15.3 13.0 - 17.0 g/dL   HCT 45.0 39.0 - 52.0 %  Urine rapid drug screen (hosp performed)     Status: None   Collection Time: 04/16/17  5:51 PM  Result Value Ref Range   Opiates NONE DETECTED NONE DETECTED   Cocaine NONE DETECTED NONE DETECTED   Benzodiazepines NONE DETECTED NONE DETECTED   Amphetamines NONE DETECTED NONE DETECTED   Tetrahydrocannabinol NONE DETECTED NONE DETECTED   Barbiturates NONE DETECTED NONE DETECTED    Comment: (NOTE) DRUG SCREEN FOR MEDICAL PURPOSES ONLY.  IF CONFIRMATION IS NEEDED FOR ANY PURPOSE, NOTIFY LAB WITHIN 5 DAYS. LOWEST DETECTABLE LIMITS FOR URINE DRUG SCREEN Drug Class                     Cutoff (ng/mL) Amphetamine and metabolites    1000 Barbiturate and metabolites    200 Benzodiazepine                 505 Tricyclics and metabolites     300 Opiates and metabolites        300 Cocaine and metabolites        300 THC                            50   Urinalysis, Routine w reflex microscopic     Status: Abnormal   Collection Time: 04/16/17  5:52 PM  Result Value Ref Range   Color, Urine YELLOW YELLOW   APPearance HAZY (A) CLEAR   Specific Gravity, Urine 1.010 1.005 - 1.030   pH 6.0 5.0 - 8.0   Glucose, UA NEGATIVE NEGATIVE mg/dL   Hgb urine dipstick NEGATIVE NEGATIVE   Bilirubin  Urine NEGATIVE NEGATIVE   Ketones, ur NEGATIVE NEGATIVE mg/dL   Protein, ur NEGATIVE NEGATIVE mg/dL   Nitrite NEGATIVE NEGATIVE   Leukocytes, UA NEGATIVE NEGATIVE  Hemoglobin A1c     Status: Abnormal   Collection Time: 04/17/17  3:44 AM  Result Value Ref Range   Hgb A1c MFr Bld 6.1 (H) 4.8 - 5.6 %    Comment: (NOTE) Pre diabetes:          5.7%-6.4% Diabetes:              >6.4% Glycemic control for   <7.0% adults with diabetes    Mean Plasma Glucose 128.37 mg/dL   Ct Angio Head W Or Wo Contrast  Result Date: 04/16/2017 CLINICAL DATA:  Acute episode of headache and gait disturbance. Negative head CT. EXAM: CT ANGIOGRAPHY HEAD AND NECK TECHNIQUE: Multidetector CT imaging of the head and neck was performed using the standard protocol during bolus administration of intravenous contrast. Multiplanar CT image reconstructions and MIPs were obtained to evaluate the vascular anatomy. Carotid stenosis measurements (when applicable) are obtained utilizing NASCET criteria, using the distal internal carotid diameter as the denominator. CONTRAST:  73m ISOVUE-370 IOPAMIDOL (ISOVUE-370) INJECTION 76% COMPARISON:  CT same day.  MRI 04/13/2015. FINDINGS: CTA NECK FINDINGS Aortic arch: Aortic atherosclerosis. No sign of aneurysm or dissection. Branching pattern is normal. Right carotid system: Common carotid artery widely patent to the bifurcation region. There is some  atherosclerotic plaque at the carotid bifurcation but no stenosis or irregularity. Cervical ICA is widely patent. Left carotid system: Common carotid artery widely patent to the bifurcation region. At the bifurcation, there is some atherosclerotic plaque but no stenosis or irregularity. Cervical ICA is widely patent. Vertebral arteries: Left vertebral artery is dominant. Left vertebral artery origin is widely patent. The vessel is widely patent through the cervical region. The right vertebral artery is a small vessel. The origin is widely patent in  the vessel is widely patent through the cervical region. Skeleton: Ordinary spondylosis. Other neck: No mass or lymphadenopathy. Upper chest: Advanced emphysema. Review of the MIP images confirms the above findings CTA HEAD FINDINGS Anterior circulation: Both internal carotid arteries are patent through the skull base. The carotid siphon regions show peripheral calcification but no stenosis. The anterior and middle cerebral vessels are widely patent without stenosis, aneurysm or vascular malformation. No missing branch vessels. Posterior circulation: Right vertebral artery terminates in PICA. Left vertebral artery supplies the basilar. No basilar stenosis. Posterior circulation branch vessels are normal. Venous sinuses: Patent and normal. Anatomic variants: None Delayed phase: No abnormal enhancement. Review of the MIP images confirms the above findings IMPRESSION: Ordinary age related atherosclerotic changes. No carotid stenosis. No vertebral stenosis. No intracranial large or medium vessel occlusion or correctable proximal stenosis. No aneurysm. Electronically Signed   By: Nelson Chimes M.D.   On: 04/16/2017 20:22   Ct Head Wo Contrast  Result Date: 04/16/2017 CLINICAL DATA:  Gait disturbance over the last 2 days. EXAM: CT HEAD WITHOUT CONTRAST TECHNIQUE: Contiguous axial images were obtained from the base of the skull through the vertex without intravenous contrast. COMPARISON:  MRI 04/13/2015.  CT 07/02/2006. FINDINGS: Brain: The brain shows a normal appearance without evidence of malformation, atrophy, old or acute small or large vessel infarction, mass lesion, hemorrhage, hydrocephalus or extra-axial collection. Vascular: There is atherosclerotic calcification of the major vessels at the base of the brain. Skull: Normal.  No traumatic finding.  No focal bone lesion. Sinuses/Orbits: Sinuses are clear. Orbits appear normal. Mastoids are clear. Other: None significant IMPRESSION: Normal head CT for age. Normal  parenchymal appearance of the brain. Atherosclerotic calcification of the major vessels at the base of the brain, typical at this age. Electronically Signed   By: Nelson Chimes M.D.   On: 04/16/2017 17:50   Ct Angio Neck W And/or Wo Contrast  Result Date: 04/16/2017 CLINICAL DATA:  Acute episode of headache and gait disturbance. Negative head CT. EXAM: CT ANGIOGRAPHY HEAD AND NECK TECHNIQUE: Multidetector CT imaging of the head and neck was performed using the standard protocol during bolus administration of intravenous contrast. Multiplanar CT image reconstructions and MIPs were obtained to evaluate the vascular anatomy. Carotid stenosis measurements (when applicable) are obtained utilizing NASCET criteria, using the distal internal carotid diameter as the denominator. CONTRAST:  52m ISOVUE-370 IOPAMIDOL (ISOVUE-370) INJECTION 76% COMPARISON:  CT same day.  MRI 04/13/2015. FINDINGS: CTA NECK FINDINGS Aortic arch: Aortic atherosclerosis. No sign of aneurysm or dissection. Branching pattern is normal. Right carotid system: Common carotid artery widely patent to the bifurcation region. There is some atherosclerotic plaque at the carotid bifurcation but no stenosis or irregularity. Cervical ICA is widely patent. Left carotid system: Common carotid artery widely patent to the bifurcation region. At the bifurcation, there is some atherosclerotic plaque but no stenosis or irregularity. Cervical ICA is widely patent. Vertebral arteries: Left vertebral artery is dominant. Left vertebral artery origin is widely patent. The vessel is  widely patent through the cervical region. The right vertebral artery is a small vessel. The origin is widely patent in the vessel is widely patent through the cervical region. Skeleton: Ordinary spondylosis. Other neck: No mass or lymphadenopathy. Upper chest: Advanced emphysema. Review of the MIP images confirms the above findings CTA HEAD FINDINGS Anterior circulation: Both internal  carotid arteries are patent through the skull base. The carotid siphon regions show peripheral calcification but no stenosis. The anterior and middle cerebral vessels are widely patent without stenosis, aneurysm or vascular malformation. No missing branch vessels. Posterior circulation: Right vertebral artery terminates in PICA. Left vertebral artery supplies the basilar. No basilar stenosis. Posterior circulation branch vessels are normal. Venous sinuses: Patent and normal. Anatomic variants: None Delayed phase: No abnormal enhancement. Review of the MIP images confirms the above findings IMPRESSION: Ordinary age related atherosclerotic changes. No carotid stenosis. No vertebral stenosis. No intracranial large or medium vessel occlusion or correctable proximal stenosis. No aneurysm. Electronically Signed   By: Nelson Chimes M.D.   On: 04/16/2017 20:22   Mr Brain Wo Contrast  Result Date: 04/16/2017 CLINICAL DATA:  Initial evaluation for acute dizziness, presyncope. EXAM: MRI HEAD WITHOUT CONTRAST TECHNIQUE: Multiplanar, multiecho pulse sequences of the brain and surrounding structures were obtained without intravenous contrast. COMPARISON:  Prior CT and CTA from earlier the same day. FINDINGS: Brain: Cerebral volume within normal limits for age. Mild scattered subcentimeter T2/FLAIR hyperintensities seen involving the periventricular and deep white matter of the anterior cerebral hemispheres bilaterally common nonspecific, but mild for age. No abnormal foci of restricted diffusion to suggest acute or subacute ischemia. Gray-white matter differentiation maintained. No encephalomalacia to suggest chronic infarction. No foci of susceptibility artifact to suggest acute or chronic intracranial hemorrhage. No mass lesion, midline shift or mass effect. No hydrocephalus. No extra-axial fluid collection. Major dural sinuses are grossly patent. Pituitary gland suprasellar region normal. Midline structures intact and  normal. Vascular: Major intracranial vascular flow voids are maintained. Skull and upper cervical spine: Craniocervical junction normal. Upper cervical spine normal. Bone marrow signal intensity within normal limits. No scalp soft tissue abnormality. Sinuses/Orbits: Globes and orbital soft tissues within normal limits. Patient status post lens extraction bilaterally. Mild scattered mucosal thickening within the ethmoidal air cells and maxillary sinuses. No significant mastoid effusion. Inner ear structures normal. Other: None. IMPRESSION: 1. No acute intracranial infarct or other abnormality identified. 2. Mild cerebral white matter changes for age. Findings are nonspecific, but most commonly related to chronic small vessel ischemic changes. Electronically Signed   By: Jeannine Boga M.D.   On: 04/16/2017 21:36    Assessment: 67 y.o. male presenting with transient symptoms suggestive of TIA involving the posterior circulation 1. Neurological exam essentially unremarkable. Two of the documented findings are of equivocal significance.  2. MRI brain reveals no acute intracranial infarct or other abnormality. Mild cerebral white matter changes for age. . 3. CTA reveals ordinary age related atherosclerotic changes. No carotid stenosis. No vertebral stenosis. No intracranial large or medium vessel occlusion or correctable proximal stenosis. No aneurysm. 4. Stroke Risk Factors - PAD, CAD, hypercholesterolemia, HTN and cholesterol embolization syndrome 5. History of seizures. Home medications include Neurontin 600 mg BID 6. EKG showed sinus rhythm  Recommendations: 1. HgbA1c, fasting lipid panel 2. TTE 3. PT consult, OT consult, Speech consult 4. Continue ASA, Plavix and pravachol  5. Risk factor modification 6. Telemetry monitoring 7. Frequent neuro checks  '@Electronically'  signed: Dr. Kerney Elbe 04/17/2017, 4:37 AM

## 2017-04-17 NOTE — Progress Notes (Signed)
OT Cancellation Note  Patient Details Name: David David MRN: [id number] DOB: 11-24-1950   Cancelled Treatment:    Reason Eval/Treat Not Completed: OT screened, no needs identified, will sign off  [name], OT/L  [phone] 04/17/2017 04/17/2017, 9:33 AM

## 2017-04-17 NOTE — Progress Notes (Signed)
  Echocardiogram 2D Echocardiogram has been performed.  [name] T Ula Couvillon 04/17/2017, 4:38 PM

## 2017-04-17 NOTE — Progress Notes (Signed)
EEG complete - results pending 

## 2017-04-17 NOTE — Evaluation (Signed)
Physical Therapy Evaluation Patient Details Name: David David MRN: [id number] DOB: 02-19-1951 Today's Date: 04/17/2017   History of Present Illness  Patient is a 67 y/o male who presents with episode of dizziness, difficulty walking, vision changes and right sided weakness, transferred from cardiology office due to concern for CVA. Admitted with TIA. Head CT and brain MRI-unremarkable. PMH includes seizures, depressive disorder, anxiety, PVD.  Clinical Impression  Patient tolerated gait training and stair training without difficulty or LOB. Tolerated higher level balance challenges without difficulty. Scored 23/24 on DGI indicating no fall risk. Pt reports all symptoms have resolved. Education re: stroke symptoms. Pt lives with daughter and independent PTA. Pt does not require skilled therapy services as pt functioning close to baseline. All education completed. Discharge from therapy.    Follow Up Recommendations No PT follow up    Equipment Recommendations  None recommended by PT    Recommendations for Other Services       Precautions / Restrictions Precautions Precautions: None Restrictions Weight Bearing Restrictions: No      Mobility  Bed Mobility Overal bed mobility: Modified Independent             General bed mobility comments: No assist needed.   Transfers Overall transfer level: Modified independent               General transfer comment: Stood from EOB x2 without difficulty.   Ambulation/Gait Ambulation/Gait assistance: Modified independent (Device/Increase time) Ambulation Distance (Feet): 300 Feet Assistive device: None Gait Pattern/deviations: Step-through pattern;Decreased stride length   Gait velocity interpretation: at or above normal speed for age/gender General Gait Details: Slow, steady gait with no evidence of imbalance. See balance section for details.   Stairs Stairs: Yes Stairs assistance: Modified independent (Device/Increase  time) Stair Management: One rail Right Number of Stairs: 13 General stair comments: Cues for safety.   Wheelchair Mobility    Modified Rankin (Stroke Patients Only) Modified Rankin (Stroke Patients Only) Pre-Morbid Rankin Score: No symptoms Modified Rankin: No significant disability     Balance Overall balance assessment: Needs assistance Sitting-balance support: Feet supported;No upper extremity supported Sitting balance-Leahy Scale: Normal Sitting balance - Comments: Able to reach outside BoS and doff/donn socks without difficulty.    Standing balance support: During functional activity Standing balance-Leahy Scale: Good Standing balance comment: Able to get stuff out of bag without difficulty reaching outside BoS.              High level balance activites: Backward walking;Direction changes;Turns;Sudden stops High Level Balance Comments: Tolerated above with no deviations in gait pattern or dizziness.  Standardized Balance Assessment Standardized Balance Assessment : Dynamic Gait Index   Dynamic Gait Index Level Surface: Normal Change in Gait Speed: Normal Gait with Horizontal Head Turns: Normal Gait with Vertical Head Turns: Normal Gait and Pivot Turn: Normal Step Over Obstacle: Normal Step Around Obstacles: Normal Steps: Mild Impairment Total Score: 23       Pertinent Vitals/Pain Pain Assessment: Faces Faces Pain Scale: Hurts a little bit Pain Location: neck Pain Descriptors / Indicators: Aching;Sore Pain Intervention(s): Monitored during session;Repositioned    Home Living Family/patient expects to be discharged to:: Private residence Living Arrangements: Children Available Help at Discharge: Family;Available PRN/intermittently Type of Home: House Home Access: Level entry     Home Layout: Two level Home Equipment: None      Prior Function Level of Independence: Independent         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  Hand  Dominance        Extremity/Trunk Assessment   Upper Extremity Assessment Upper Extremity Assessment: Defer to OT evaluation    Lower Extremity Assessment Lower Extremity Assessment: Overall WFL for tasks assessed       Communication   Communication: No difficulties  Cognition Arousal/Alertness: Awake/alert Behavior During Therapy: WFL for tasks assessed/performed Overall Cognitive Status: Within Functional Limits for tasks assessed                                        General Comments      Exercises     Assessment/Plan    PT Assessment Patent does not need any further PT services  PT Problem List         PT Treatment Interventions      PT Goals (Current goals can be found in the Care Plan section)  Acute Rehab PT Goals PT Goal Formulation: All assessment and education complete, DC therapy Time For Goal Achievement: 05/01/17    Frequency     Barriers to discharge        Co-evaluation               AM-PAC PT "6 Clicks" Daily Activity  Outcome Measure Difficulty turning over in bed (including adjusting bedclothes, sheets and blankets)?: None Difficulty moving from lying on back to sitting on the side of the bed? : None Difficulty sitting down on and standing up from a chair with arms (e.g., wheelchair, bedside commode, etc,.)?: None Help needed moving to and from a bed to chair (including a wheelchair)?: None Help needed walking in hospital room?: None Help needed climbing 3-5 steps with a railing? : A Little 6 Click Score: 23    End of Session   Activity Tolerance: Patient tolerated treatment well Patient left: in bed;with call bell/phone within reach(sitting EOB.) Nurse Communication: Mobility status PT Visit Diagnosis: Unsteadiness on feet (R26.81)    Time: 2334-3568 PT Time Calculation (min) (ACUTE ONLY): 21 min   Charges:   PT Evaluation $PT Eval Low Complexity: 1 Low     PT G CodesWray Kearns, PT,  DPT [phone]    Marguarite Arbour A Ernie Sagrero 04/17/2017, 9:20 AM

## 2017-04-17 NOTE — Care Management Note (Signed)
Case Management Note  Patient Details  Name: David David MRN: [id number] Date of Birth: 07/21/1950  Subjective/Objective:    Pt in with TIA. He is from home with family.                Action/Plan: No f/u per PT. Awaiting OT eval. CM following for d/c needs, physician orders.    Expected Discharge Date:                  Expected Discharge Plan:  Home/Self Care  In-House Referral:     Discharge planning Services     Post Acute Care Choice:    Choice offered to:     DME Arranged:    DME Agency:     HH Arranged:    HH Agency:     Status of Service:  In process, will continue to follow  If discussed at Long Length of Stay Meetings, dates discussed:    Additional Comments:  Pollie Friar, RN 04/17/2017, 11:01 AM

## 2017-04-17 NOTE — Progress Notes (Signed)
Discharge instructions given. Taken down via wheelchair by [employer].

## 2017-04-17 NOTE — Progress Notes (Addendum)
NEUROHOSPITALISTS STROKE TEAM - DAILY PROGRESS NOTE   SUBJECTIVE (INTERVAL HISTORY) Daughter is at the bedside. Patient is found laying in bed in NAD. Overall he feels his condition is completely resolved. Voices no new complaints. No new events reported overnight. Patient asking if he will be discharged soon. Explains that he has only had one seizure and that was when he was put on a high dose of Wellbutrin for PTSD at the [place] in 2009.    OBJECTIVE Lab Results: CBC:  Recent Labs  Lab 04/16/17 1618 04/16/17 1637  WBC 7.5  --   HGB 14.4 15.3  HCT 43.2 45.0  MCV 90.8  --   PLT 149*  --    BMP: Recent Labs  Lab 04/16/17 1618 04/16/17 1637  NA 141 144  K 4.2 4.6  CL 107 106  CO2 25  --   GLUCOSE 90 87  BUN 11 13  CREATININE 0.96 0.90  CALCIUM 9.3  --    Liver Function Tests:  Recent Labs  Lab 04/16/17 1618  AST 31  ALT 26  ALKPHOS 89  BILITOT 0.4  PROT 7.3  ALBUMIN 3.9   Coagulation Studies:  Recent Labs    04/16/17 1618  APTT 31  INR 0.97   Urinalysis:  Recent Labs  Lab 04/16/17 1752  COLORURINE YELLOW  APPEARANCEUR HAZY*  LABSPEC 1.010  PHURINE 6.0  GLUCOSEU NEGATIVE  HGBUR NEGATIVE  BILIRUBINUR NEGATIVE  KETONESUR NEGATIVE  PROTEINUR NEGATIVE  NITRITE NEGATIVE  LEUKOCYTESUR NEGATIVE   PHYSICAL EXAM Temp:  [97.7 F (36.5 C)-98.3 F (36.8 C)] 98.3 F (36.8 C) (01/31 1413) Pulse Rate:  [63-88] 70 (01/31 1413) Resp:  [10-18] 16 (01/31 1413) BP: (116-147)/(54-108) 140/79 (01/31 1413) SpO2:  [94 %-99 %] 97 % (01/31 1413) Weight:  [76.2 kg (167 lb 15.9 oz)] 76.2 kg (167 lb 15.9 oz) (01/30 2356) General - Well nourished, well developed, in no apparent distress HEENT-  Normocephalic Cardiovascular - Regular rate and rhythm  Respiratory - Lungs clear bilaterally. No wheezing. Abdomen - soft and non-tender, BS normal Extremities- no edema or cyanosis Neurologic Examination: Mental Status:  Alert, oriented, thought content appropriate.  Speech fluent without evidence of aphasia.  Able to follow all commands without difficulty. Cranial Nerves: II:  Visual fields intact. PERRL.  III,IV, VI: ptosis not present, EOMI, no nystagmus V,VII: Smile symmetric, facial temp sensation decreased left V2 VIII: Hearing intact to conversation IX,X: No hypophonia XI: Symmetric XII: midline tongue extension  Motor: Right : Upper extremity   5/5    Left:     Upper extremity   5/5  Lower extremity   5/5     Lower extremity   5/5 Normal tone throughout; no atrophy noted Sensory: Temp and light touch intact proximally x 4. One instance of extinction to DSS, left arm Deep Tendon Reflexes:  2+ bilateral brachiordialis and biceps 3+ bilateral patellae, 2+ achilles Cerebellar: No ataxia with FNF or heel-shin bilaterally Gait: Deferred  IMAGING: I have personally reviewed the radiological images below and agree with the radiology interpretations.  Ct Head Wo Contrast Result Date: 04/16/2017  IMPRESSION: Normal head CT for age. Normal parenchymal appearance of the brain. Atherosclerotic calcification of the major vessels at the base of the brain, typical at this age. Electronically Signed   By: Nelson Chimes M.D.   On: 04/16/2017 17:50   Ct Angio Head Lavetta Nielsen W And/or Wo Contrast Result Date: 04/16/2017  IMPRESSION: Ordinary age related atherosclerotic changes. No carotid stenosis. No  vertebral stenosis. No intracranial large or medium vessel occlusion or correctable proximal stenosis. No aneurysm. Electronically Signed   By: Nelson Chimes M.D.   On: 04/16/2017 20:22   Mr Brain Wo Contrast Result Date: 04/16/2017  IMPRESSION: 1. No acute intracranial infarct or other abnormality identified. 2. Mild cerebral white matter changes for age. Findings are nonspecific, but most commonly related to chronic small vessel ischemic changes. Electronically Signed   By: Jeannine Boga M.D.   On: 04/16/2017 21:36     Echocardiogram:                                              PENDING EEG:                                                                   PENDING    IMPRESSION: Mr. ARGUS CARAHER is a 67 y.o. male with PMH of CAD, PAD and Depression who presents with transient episode of Right side weakness, visual changes and dizziness. MRI is negative for acute findings  Likely TIA vs Syncopal Episode:  Suspected Etiology: small vessel disease vs cardiac cause Resultant Symptoms: right-sided weakness, dizziness, vision impairment Stroke Risk Factors: hyperlipidemia and hypertension Other Stroke Risk Factors: Advanced age, CAD, PAD  Outstanding Stroke Work-up Studies:     Echocardiogram:                                                    PENDING EEG:                                                                      PENDING  PLAN  04/17/2017: Continue Aspirin/ Plavix/Statin Patient likely to be d/c'ed home later today Frequent neuro checks Telemetry monitoring PT/OT/SLP Ongoing aggressive stroke risk factor management Patient counseled to be compliant with his antithrombotic medications Patient counseled on Lifestyle modifications including, Diet, Exercise, and Stress Follow up with Dr Delice Lesch, Outpatient Neurologist iin 6 weeks Follow up with Cardiology for outpatient work-up for syncope  PAD on DAPT therapy: The patient has been on dual antiplatelet therapy with aspirin and Plavix for peripheral arterial disease, s/p stent to the popliteal artery.  Hx of SEIZURES: EEG - PENDING No seizure activity reported overnight Patient had seizure with high dose Wellbutrin in 2009 No seizure activity since Follow up with Dr Delice Lesch, Outpatient Neurologist  HYPERTENSION: Stable Long term BP goal normotensive. May slowly restart home B/P medications  Home Meds: Norvasc  HYPERLIPIDEMIA:    Component Value Date/Time   CHOL 126 04/17/2017 0344   TRIG 150 (H) 04/17/2017 0344   HDL 36 (L)  04/17/2017 0344   CHOLHDL 3.5 04/17/2017 0344   VLDL 30 04/17/2017 0344   LDLCALC 60 04/17/2017 0344  Home Meds:  Pravachol 40 mg LDL  goal < 70 Continued on  Pravachol 40 mg daily Continue statin at discharge  PRE- DIABETES: Lab Results  Component Value Date   HGBA1C 6.1 (H) 04/17/2017  HgbA1c goal < 7.0 Continue CBG monitoring and SSI to maintain glucose 140-180 mg/dl DM education  Close follow up with PCP  Other Active Problems: Principal Problem:   TIA (transient ischemic attack) Active Problems:   Essential hypertension   Hyperlipidemia   PAD (peripheral artery disease) (HCC)   COPD (chronic obstructive pulmonary disease) [facility])    Hospital day # 0 VTE prophylaxis: SCD's  Diet : Fall precautions Diet Heart Room service appropriate? Yes; Fluid consistency: Thin   FAMILY UPDATES:  family at bedside  TEAM UPDATES: Dessa Phi, DO   Prior Home Stroke Medications: aspirin 81 mg daily and clopidogrel 75 mg daily  Discharge Stroke Meds:  Please discharge patient on aspirin 81 mg daily and clopidogrel 75 mg daily   Disposition: 01-Home or Self Care Therapy Recs:               PENDING Follow Up:  Follow-up Information    Cameron Sprang, MD. Schedule an appointment as soon as possible for a visit in 6 week(s).   Specialty:  Neurology Contact information: [place] [address] [phone]          [name], [name], MD -PCP Follow up in 1-2 weeks      Assessment & plan discussed with with attending physician and they are in agreement.    Renie Ora Stroke Neurology Team 04/17/2017 4:48 PM  ATTENDING NOTE: I reviewed above note and agree with the assessment and plan. I have made any additions or clarifications directly to the above note. Pt was seen and examined.   66 year old male with history of hypertension, hyperlipidemia, PVD status post stent, alcohol use, former smoker, CKD, seizure on Wellbutrin admitted for  episode of dizziness, lightheadedness, blurred vision, feeling about passing out.  Stroke workup so far unremarkable including CT head and neck, MRI, EEG, and TTE.  A1c 6.1 and LDL 60.  UDS negative.  Patient already on aspirin and Plavix and pravastatin at home, recommend to continue on discharge.  Patient symptoms less likely to be stroke related, or concerning for near syncope.  He did not check blood pressure at that time and not sure about his sugar levels.  Recommend outpatient cardiology follow-up.  Neurology will sign off. Please call with questions.  Pt will follow up with his neurologist Dr. Delice Lesch in 6 weeks.  Thanks for the consult.   Rosalin Hawking, MD PhD Stroke Neurology 04/18/2017 12:20 AM  To contact Stroke Continuity provider, please refer to [email or web address] After hours, contact General Neurology

## 2017-04-17 NOTE — Progress Notes (Signed)
Arrived from [place]. Alert and oriented. Denies any pain. Call light within reach.

## 2017-04-17 NOTE — Care Management Obs Status (Signed)
[facility] NOTIFICATION   Patient Details  Name: David David MRN: [id number] Date of Birth: November 01, 1950   Medicare Observation Status Notification Given:  Yes    Pollie Friar, RN 04/17/2017, 4:07 PM

## 2017-04-17 NOTE — Discharge Summary (Signed)
Physician Discharge Summary  BERTEL VENARD HQI:[id number] DOB: 1951/02/14 DOA: 04/16/2017  PCP: [name], [name], MD  Admit date: 04/16/2017 Discharge date: 04/17/2017  Admitted From: Home Disposition: Home  Recommendations for Outpatient Follow-up:  1. Follow up with PCP in 1 week 2. Follow up with Cardiology  3. Follow-up with neurology in 6 weeks 4. Follow up on EEG result   Discharge Condition: Stable CODE STATUS: Full Diet recommendation: Heart healthy   Brief/Interim Summary: HPI: AKI David David is a 67 y.o. male with medical history significant of peripheral vascular disease comes in from his cardiology office for an episode yesterday that occurred where he got dizzy and felt foggy felt like he was going to pass out.  He says he became very unbalanced in his gait.  He walked to his vehicle and sat in his vehicle and within 15 minutes he was back to normal.  He never lost any consciousness.  He denies any recent illnesses no fevers.  No nausea vomiting diarrhea.  He denies any numbness tingling or weakness anywhere.  This episode occurred yesterday has not happened again.  He mentioned this in his MD office today and they sent him to the emergency department via ambulance.  He denies any chest pain shortness of breath or peripheral edema.  Patient is being referred for admission for possible TIA.  Stroke team was consulted.  Patient underwent workup including MRI brain, CTA head and neck, EEG as well as cardiac echocardiogram.  MRI brain was negative for acute intracranial infarct or abnormalities.  CTA head and neck without intracranial large or medium vessel occlusion or stenosis.  CT head was unremarkable for acute abnormalities. EEG completed, results pending. Echo completed unremarkable.  Neurology felt that this was more consistent with near syncopal episode vs TIA.  Patient is to continue his usual aspirin and Plavix and follow-up with cardiology as outpatient.  Patient symptoms  had all resolved and was back to feeling baseline.  Discharge Diagnoses:  Principal Problem:   Near syncope Active Problems:   Essential hypertension   Hyperlipidemia   PAD (peripheral artery disease) (HCC)   COPD (chronic obstructive pulmonary disease) (HCC)   TIA (transient ischemic attack)   Discharge Instructions  Discharge Instructions    Ambulatory referral to Neurology   Complete by:  As directed    An appointment is requested in approximately: 6 weeks   Call MD for:  difficulty breathing, headache or visual disturbances   Complete by:  As directed    Call MD for:  extreme fatigue   Complete by:  As directed    Call MD for:  persistant dizziness or light-headedness   Complete by:  As directed    Call MD for:  persistant nausea and vomiting   Complete by:  As directed    Call MD for:  severe uncontrolled pain   Complete by:  As directed    Call MD for:  temperature >100.4   Complete by:  As directed    Diet - low sodium heart healthy   Complete by:  As directed    Discharge instructions   Complete by:  As directed    You were cared for by a hospitalist during your hospital stay. If you have any questions about your discharge medications or the care you received while you were in the hospital after you are discharged, you can call the unit and asked to speak with the hospitalist on call if the hospitalist that took care of  you is not available. Once you are discharged, your primary care physician will handle any further medical issues. Please note that NO REFILLS for any discharge medications will be authorized once you are discharged, as it is imperative that you return to your primary care physician (or establish a relationship with a primary care physician if you do not have one) for your aftercare needs so that they can reassess your need for medications and monitor your lab values.   Increase activity slowly   Complete by:  As directed      Allergies as of 04/17/2017       Reactions   Tape Other (See Comments)   "Plastic" tape TEARS THE SKIN; please use paper tape or Coban wrap!!   Neomycin-bacitracin Zn-polymyx Rash   Niacin Rash   Paroxetine Other (See Comments)   Makes the patient feel SEVERELY SEDATED/LETHARGIC/CANNOT SPEAK WORDS      Medication List    TAKE these medications   albuterol 108 (90 Base) MCG/ACT inhaler Commonly known as:  PROVENTIL HFA;VENTOLIN HFA Inhale 2 puffs into the lungs as needed for wheezing or shortness of breath.   alfuzosin 10 MG 24 hr tablet Commonly known as:  UROXATRAL Take 10 mg by mouth at bedtime.   amLODipine 10 MG tablet Commonly known as:  NORVASC Take 5 mg by mouth daily.   ammonium lactate 12 % lotion Commonly known as:  LAC-HYDRIN Apply 1 application topically 2 (two) times daily as needed for dry skin.   aspirin EC 81 MG tablet Take 1 tablet (81 mg total) by mouth daily.   calcium-vitamin D 500-200 MG-UNIT tablet Take 1 tablet by mouth daily.   cholecalciferol 1000 units tablet Commonly known as:  VITAMIN D Take 1,000 Units by mouth daily.   clopidogrel 75 MG tablet Commonly known as:  PLAVIX Take 1 tablet (75 mg total) by mouth daily.   cyclobenzaprine 10 MG tablet Commonly known as:  FLEXERIL Take 1 tablet (10 mg total) by mouth at bedtime.   Fish Oil 1000 MG Caps Take 1,000 mg by mouth 2 (two) times daily.   gabapentin 300 MG capsule Commonly known as:  NEURONTIN Take 600 mg by mouth 2 (two) times daily.   hydrOXYzine 50 MG tablet Commonly known as:  ATARAX/VISTARIL Take 50 mg by mouth 2 (two) times daily.   levothyroxine 75 MCG tablet Commonly known as:  SYNTHROID, LEVOTHROID Take 1 tablet (75 mcg total) by mouth daily before breakfast.   LEXAPRO 10 MG tablet Generic drug:  escitalopram Take 30 mg by mouth every morning.   mirtazapine 15 MG tablet Commonly known as:  REMERON Take 1 tablet (15 mg total) by mouth at bedtime.   multivitamin per tablet Take 1 tablet  by mouth daily.   OLANZapine 10 MG tablet Commonly known as:  ZYPREXA Take 5 mg by mouth at bedtime.   omeprazole 40 MG capsule Commonly known as:  PRILOSEC Take 40 mg by mouth daily.   pravastatin 40 MG tablet Commonly known as:  PRAVACHOL Take 40 mg by mouth at bedtime.   vitamin E 600 UNIT capsule Take 600 Units by mouth daily.   zolpidem 10 MG tablet Commonly known as:  AMBIEN Take 5 mg by mouth at bedtime.      Follow-up Information    Cameron Sprang, MD. Schedule an appointment as soon as possible for a visit in 6 week(s).   Specialty:  Neurology Contact information: [address] [place] [address]  [phone]        [name], [name], MD. Schedule an appointment as soon as possible for a visit in 1 week(s).   Specialty:  Family Medicine Contact information: [place] [address] [phone]        Wellington Hampshire, MD. Call.   Specialty:  Cardiology Contact information: [address] [place] [address] [phone]          Allergies  Allergen Reactions  . Tape Other (See Comments)    "Plastic" tape TEARS THE SKIN; please use paper tape or Coban wrap!!  . Neomycin-Bacitracin Zn-Polymyx Rash  . Niacin Rash  . Paroxetine Other (See Comments)    Makes the patient feel SEVERELY SEDATED/LETHARGIC/CANNOT SPEAK WORDS    Consultations:  Neurology   Procedures/Studies: Ct Angio Head W Or Wo Contrast  Result Date: 04/16/2017 CLINICAL DATA:  Acute episode of headache and gait disturbance. Negative head CT. EXAM: CT ANGIOGRAPHY HEAD AND NECK TECHNIQUE: Multidetector CT imaging of the head and neck was performed using the standard protocol during bolus administration of intravenous contrast. Multiplanar CT image reconstructions and MIPs were obtained to evaluate the vascular anatomy. Carotid stenosis measurements (when applicable) are obtained utilizing NASCET criteria, using the distal  internal carotid diameter as the denominator. CONTRAST:  72mL ISOVUE-370 IOPAMIDOL (ISOVUE-370) INJECTION 76% COMPARISON:  CT same day.  MRI 04/13/2015. FINDINGS: CTA NECK FINDINGS Aortic arch: Aortic atherosclerosis. No sign of aneurysm or dissection. Branching pattern is normal. Right carotid system: Common carotid artery widely patent to the bifurcation region. There is some atherosclerotic plaque at the carotid bifurcation but no stenosis or irregularity. Cervical ICA is widely patent. Left carotid system: Common carotid artery widely patent to the bifurcation region. At the bifurcation, there is some atherosclerotic plaque but no stenosis or irregularity. Cervical ICA is widely patent. Vertebral arteries: Left vertebral artery is dominant. Left vertebral artery origin is widely patent. The vessel is widely patent through the cervical region. The right vertebral artery is a small vessel. The origin is widely patent in the vessel is widely patent through the cervical region. Skeleton: Ordinary spondylosis. Other neck: No mass or lymphadenopathy. Upper chest: Advanced emphysema. Review of the MIP images confirms the above findings CTA HEAD FINDINGS Anterior circulation: Both internal carotid arteries are patent through the skull base. The carotid siphon regions show peripheral calcification but no stenosis. The anterior and middle cerebral vessels are widely patent without stenosis, aneurysm or vascular malformation. No missing branch vessels. Posterior circulation: Right vertebral artery terminates in PICA. Left vertebral artery supplies the basilar. No basilar stenosis. Posterior circulation branch vessels are normal. Venous sinuses: Patent and normal. Anatomic variants: None Delayed phase: No abnormal enhancement. Review of the MIP images confirms the above findings IMPRESSION: Ordinary age related atherosclerotic changes. No carotid stenosis. No vertebral stenosis. No intracranial large or medium vessel  occlusion or correctable proximal stenosis. No aneurysm. Electronically Signed   By: Nelson Chimes M.D.   On: 04/16/2017 20:22   Ct Head Wo Contrast  Result Date: 04/16/2017 CLINICAL DATA:  Gait disturbance over the last 2 days. EXAM: CT HEAD WITHOUT CONTRAST TECHNIQUE: Contiguous axial images were obtained from the base of the skull through the vertex without intravenous contrast. COMPARISON:  MRI 04/13/2015.  CT 07/02/2006. FINDINGS: Brain: The brain shows a normal appearance without evidence of malformation, atrophy, old or acute small or large vessel infarction, mass lesion, hemorrhage, hydrocephalus or extra-axial collection. Vascular: There is atherosclerotic calcification of the  major vessels at the base of the brain. Skull: Normal.  No traumatic finding.  No focal bone lesion. Sinuses/Orbits: Sinuses are clear. Orbits appear normal. Mastoids are clear. Other: None significant IMPRESSION: Normal head CT for age. Normal parenchymal appearance of the brain. Atherosclerotic calcification of the major vessels at the base of the brain, typical at this age. Electronically Signed   By: Nelson Chimes M.D.   On: 04/16/2017 17:50   Ct Angio Neck W And/or Wo Contrast  Result Date: 04/16/2017 CLINICAL DATA:  Acute episode of headache and gait disturbance. Negative head CT. EXAM: CT ANGIOGRAPHY HEAD AND NECK TECHNIQUE: Multidetector CT imaging of the head and neck was performed using the standard protocol during bolus administration of intravenous contrast. Multiplanar CT image reconstructions and MIPs were obtained to evaluate the vascular anatomy. Carotid stenosis measurements (when applicable) are obtained utilizing NASCET criteria, using the distal internal carotid diameter as the denominator. CONTRAST:  58mL ISOVUE-370 IOPAMIDOL (ISOVUE-370) INJECTION 76% COMPARISON:  CT same day.  MRI 04/13/2015. FINDINGS: CTA NECK FINDINGS Aortic arch: Aortic atherosclerosis. No sign of aneurysm or dissection. Branching  pattern is normal. Right carotid system: Common carotid artery widely patent to the bifurcation region. There is some atherosclerotic plaque at the carotid bifurcation but no stenosis or irregularity. Cervical ICA is widely patent. Left carotid system: Common carotid artery widely patent to the bifurcation region. At the bifurcation, there is some atherosclerotic plaque but no stenosis or irregularity. Cervical ICA is widely patent. Vertebral arteries: Left vertebral artery is dominant. Left vertebral artery origin is widely patent. The vessel is widely patent through the cervical region. The right vertebral artery is a small vessel. The origin is widely patent in the vessel is widely patent through the cervical region. Skeleton: Ordinary spondylosis. Other neck: No mass or lymphadenopathy. Upper chest: Advanced emphysema. Review of the MIP images confirms the above findings CTA HEAD FINDINGS Anterior circulation: Both internal carotid arteries are patent through the skull base. The carotid siphon regions show peripheral calcification but no stenosis. The anterior and middle cerebral vessels are widely patent without stenosis, aneurysm or vascular malformation. No missing branch vessels. Posterior circulation: Right vertebral artery terminates in PICA. Left vertebral artery supplies the basilar. No basilar stenosis. Posterior circulation branch vessels are normal. Venous sinuses: Patent and normal. Anatomic variants: None Delayed phase: No abnormal enhancement. Review of the MIP images confirms the above findings IMPRESSION: Ordinary age related atherosclerotic changes. No carotid stenosis. No vertebral stenosis. No intracranial large or medium vessel occlusion or correctable proximal stenosis. No aneurysm. Electronically Signed   By: Nelson Chimes M.D.   On: 04/16/2017 20:22   Mr Brain Wo Contrast  Result Date: 04/16/2017 CLINICAL DATA:  Initial evaluation for acute dizziness, presyncope. EXAM: MRI HEAD WITHOUT  CONTRAST TECHNIQUE: Multiplanar, multiecho pulse sequences of the brain and surrounding structures were obtained without intravenous contrast. COMPARISON:  Prior CT and CTA from earlier the same day. FINDINGS: Brain: Cerebral volume within normal limits for age. Mild scattered subcentimeter T2/FLAIR hyperintensities seen involving the periventricular and deep white matter of the anterior cerebral hemispheres bilaterally common nonspecific, but mild for age. No abnormal foci of restricted diffusion to suggest acute or subacute ischemia. Gray-white matter differentiation maintained. No encephalomalacia to suggest chronic infarction. No foci of susceptibility artifact to suggest acute or chronic intracranial hemorrhage. No mass lesion, midline shift or mass effect. No hydrocephalus. No extra-axial fluid collection. Major dural sinuses are grossly patent. Pituitary gland suprasellar region normal. Midline structures intact and  normal. Vascular: Major intracranial vascular flow voids are maintained. Skull and upper cervical spine: Craniocervical junction normal. Upper cervical spine normal. Bone marrow signal intensity within normal limits. No scalp soft tissue abnormality. Sinuses/Orbits: Globes and orbital soft tissues within normal limits. Patient status post lens extraction bilaterally. Mild scattered mucosal thickening within the ethmoidal air cells and maxillary sinuses. No significant mastoid effusion. Inner ear structures normal. Other: None. IMPRESSION: 1. No acute intracranial infarct or other abnormality identified. 2. Mild cerebral white matter changes for age. Findings are nonspecific, but most commonly related to chronic small vessel ischemic changes. Electronically Signed   By: Jeannine Boga M.D.   On: 04/16/2017 21:36    Echo Study Conclusions  - Left ventricle: The cavity size was normal. Systolic function was   normal. The estimated ejection fraction was in the range of 55%   to 60%.  Wall motion was normal; there were no regional wall   motion abnormalities. Left ventricular diastolic function   parameters were normal. - Mitral valve: Valve area by pressure half-time: 2.04 cm^2. - Atrial septum: A patent foramen ovale cannot be excluded.    EEG Results pending    Discharge Exam: Vitals:   04/17/17 1413 04/17/17 1734  BP: 140/79 135/71  Pulse: 70 75  Resp: 16 20  Temp: 98.3 F (36.8 C) 98.3 F (36.8 C)  SpO2: 97% 99%   Vitals:   04/17/17 0552 04/17/17 1040 04/17/17 1413 04/17/17 1734  BP: (!) 122/59 138/76 140/79 135/71  Pulse: 69 71 70 75  Resp: 18 16 16 20   Temp: 98.1 F (36.7 C) 97.7 F (36.5 C) 98.3 F (36.8 C) 98.3 F (36.8 C)  TempSrc: Oral Oral Oral Oral  SpO2: 99% 98% 97% 99%  Weight:      Height:        General: Pt is alert, awake, not in acute distress Cardiovascular: RRR, S1/S2 +, no rubs, no gallops Respiratory: CTA bilaterally, no wheezing, no rhonchi Abdominal: Soft, NT, ND, bowel sounds + Extremities: no edema, no cyanosis Neuro: alert, oriented, no focal deficits     The results of significant diagnostics from this hospitalization (including imaging, microbiology, ancillary and laboratory) are listed below for reference.     Microbiology: No results found for this or any previous visit (from the past 240 hour(s)).   Labs: BNP (last 3 results) No results for input(s): BNP in the last 8760 hours. Basic Metabolic Panel: Recent Labs  Lab 04/16/17 1618 04/16/17 1637  NA 141 144  K 4.2 4.6  CL 107 106  CO2 25  --   GLUCOSE 90 87  BUN 11 13  CREATININE 0.96 0.90  CALCIUM 9.3  --    Liver Function Tests: Recent Labs  Lab 04/16/17 1618  AST 31  ALT 26  ALKPHOS 89  BILITOT 0.4  PROT 7.3  ALBUMIN 3.9   No results for input(s): LIPASE, AMYLASE in the last 168 hours. No results for input(s): AMMONIA in the last 168 hours. CBC: Recent Labs  Lab 04/16/17 1618 04/16/17 1637  WBC 7.5  --   NEUTROABS 4.2  --    HGB 14.4 15.3  HCT 43.2 45.0  MCV 90.8  --   PLT 149*  --    Cardiac Enzymes: No results for input(s): CKTOTAL, CKMB, CKMBINDEX, TROPONINI in the last 168 hours. BNP: Invalid input(s): POCBNP CBG: No results for input(s): GLUCAP in the last 168 hours. D-Dimer No results for input(s): DDIMER in the last 72 hours. Hgb  A1c Recent Labs    04/17/17 0344  HGBA1C 6.1*   Lipid Profile Recent Labs    04/17/17 0344  CHOL 126  HDL 36*  LDLCALC 60  TRIG 150*  CHOLHDL 3.5   Thyroid function studies No results for input(s): TSH, T4TOTAL, T3FREE, THYROIDAB in the last 72 hours.  Invalid input(s): FREET3 Anemia work up No results for input(s): VITAMINB12, FOLATE, FERRITIN, TIBC, IRON, RETICCTPCT in the last 72 hours. Urinalysis    Component Value Date/Time   COLORURINE YELLOW 04/16/2017 1752   APPEARANCEUR HAZY (A) 04/16/2017 1752   LABSPEC 1.010 04/16/2017 1752   PHURINE 6.0 04/16/2017 1752   GLUCOSEU NEGATIVE 04/16/2017 1752   HGBUR NEGATIVE 04/16/2017 1752   BILIRUBINUR NEGATIVE 04/16/2017 1752   KETONESUR NEGATIVE 04/16/2017 1752   PROTEINUR NEGATIVE 04/16/2017 1752   UROBILINOGEN 1.0 01/30/2014 2044   NITRITE NEGATIVE 04/16/2017 1752   LEUKOCYTESUR NEGATIVE 04/16/2017 1752   Sepsis Labs Invalid input(s): PROCALCITONIN,  WBC,  LACTICIDVEN Microbiology No results found for this or any previous visit (from the past 240 hour(s)).   Time coordinating discharge: 40 minutes  SIGNED:  Dessa Phi, DO [facility] Pager [phone]  If 7PM-7AM, please contact night-coverage [email or web address] Password [facility] 04/17/2017, 6:45 PM

## 2017-04-17 NOTE — Procedures (Signed)
ELECTROENCEPHALOGRAM REPORT  Date of Study: 04/17/17  Patient's Name: David David MRN: [id number] Date of Birth: 06-25-1950  Referring Provider: Candise Che, NP  Clinical History: [name] is a 67 y.o. male with PMH of CAD, PAD and Depression who presents with transient episode of Right side weakness, visual changes and dizziness. MRI is negative for acute findings   Medications: Scheduled Meds: .  stroke: mapping our early stages of recovery book   Does not apply Once  . alfuzosin  10 mg Oral QHS  . amLODipine  5 mg Oral Daily  . aspirin EC  81 mg Oral Daily  . calcium-vitamin D  1 tablet Oral Daily  . cholecalciferol  1,000 Units Oral Daily  . clopidogrel  75 mg Oral Daily  . cyclobenzaprine  10 mg Oral QHS  . escitalopram  30 mg Oral Daily  . gabapentin  600 mg Oral BID  . hydrOXYzine  50 mg Oral BID  . levothyroxine  75 mcg Oral QAC breakfast  . mirtazapine  15 mg Oral QHS  . multivitamin with minerals  1 tablet Oral Daily  . OLANZapine  5 mg Oral QHS  . omega-3 acid ethyl esters  1,000 mg Oral Daily  . pravastatin  40 mg Oral QHS  . vitamin E  600 Units Oral Daily  . zolpidem  5 mg Oral QHS   Continuous Infusions: PRN Meds:.acetaminophen **OR** acetaminophen (TYLENOL) oral liquid 160 mg/5 mL **OR** acetaminophen, albuterol            Technical Summary: This is a standard 16 channel EEG recording performed according to the international 10-20 electrode system.  AP bipolar, transverse bipolar, and referential montages were obtained, and digitally reformatted as necessary.  Duration of Tracing: 25:07  Description: In the awake state there is an 8 Hz alpha rhythm seen from the posterior head regions in a symmetric fashion.  Drowsiness is noted by dropout of background alpha and vertex slowing, however no definite stage II sleep was noted.   Photic stimulation was performed without notable changes. HV was not performed. EKG was monitored and noted to be  sinus rhythym with an average heart rate of 66 bpm.  No epileptiform changes were identified.  Impression: This is a normal EEG in the awake and drowsy states without any epileptiform abnormalities noted.  A single normal EEG does not exclude the diagnosis of epilepsy.  Clinical correlation advised.   Carvel Getting, M.D. Cell [phone] [phone]

## 2017-04-17 NOTE — Progress Notes (Signed)
  Speech Language Pathology  Patient Details Name: David David MRN: [id number] DOB: 1951-03-07 Today's Date: 04/17/2017 Time:  -        Pt at baseline with PT and OT who did not recommend treatment in hospital or at discharge. No need for formal assessment.     Houston Siren 04/17/2017, 12:31 PM  Orbie Pyo Colvin Caroli.Ed [employer] [phone]

## 2017-04-18 ENCOUNTER — Telehealth: Payer: Self-pay | Admitting: *Deleted

## 2017-04-18 NOTE — Telephone Encounter (Signed)
Transition Care Management Follow-up Telephone Call  Per Discharge Summary: Admit date: 04/16/2017 Discharge date: 04/17/2017  Admitted From: Home Disposition: Home  Recommendations for Outpatient Follow-up:  1. Follow up with PCP in 1 week 2. Follow up with Cardiology  3. Follow-up with neurology in 6 weeks 4. Follow up on EEG result   Discharge Condition: Stable CODE STATUS: Full Diet recommendation: Heart healthy   --   How have you been since you were released from the hospital? "I've been okay."   Do you understand why you were in the hospital? yes   Do you understand the discharge instructions? yes   Where were you discharged to? Home   Items Reviewed:  Medications reviewed: yes  Allergies reviewed: yes  Dietary changes reviewed: yes  Referrals reviewed: yes   Functional Questionnaire:   Activities of Daily Living (ADLs):   He states they are independent in the following: ambulation, bathing and hygiene, feeding, continence, grooming, toileting and dressing States they require assistance with the following: none   Any transportation issues/concerns?: no   Any patient concerns? no   Confirmed importance and date/time of follow-up visits scheduled yes  Provider Appointment booked with Dr. [name] 04/23/17 @ 10:45am  Confirmed with patient if condition begins to worsen call PCP or go to the ER.  Patient was given the office number and encouraged to call back with question or concerns.  : yes

## 2017-04-18 NOTE — Telephone Encounter (Signed)
Unable to reach patient at time of TCM Call. Left message for patient to return call when available.  

## 2017-04-22 NOTE — Progress Notes (Signed)
HPI:   [name] is a 67 y.o. male, who is here today to follow on recent hospitalization.  He presented to the ER on 04/16/2017 after presyncopal episode and MS changes that lasted about 15 minutes.  There were not loss of consciousness or seizure-like activity.  He was discharged on 04/17/2017. He has history of PVD.  Head and neck CTA:  Ordinary age related atherosclerotic changes. No carotid stenosis.No vertebral stenosis. No intracranial large or medium vessel occlusion or correctable proximal stenosis. No aneurysm.  Brain MRI negative for acute intracranial infarct or abnormality.  Mild cerebral white matter changes for age.  Lab Results  Component Value Date   CHOL 126 04/17/2017   HDL 36 (L) 04/17/2017   LDLCALC 60 04/17/2017   TRIG 150 (H) 04/17/2017   CHOLHDL 3.5 04/17/2017   Lab Results  Component Value Date   HGBA1C 6.1 (H) 04/17/2017   Lab Results  Component Value Date   CREATININE 0.90 04/16/2017   BUN 13 04/16/2017   NA 144 04/16/2017   K 4.6 04/16/2017   CL 106 04/16/2017   CO2 25 04/16/2017   Lab Results  Component Value Date   ALT 26 04/16/2017   AST 31 04/16/2017   ALKPHOS 89 04/16/2017   BILITOT 0.4 04/16/2017   Neurology evaluation on 04/17/2017, prior to his discharge.  6 weeks follow-up recommended. He is currently on Pravastatin 40 mg, Aspirin 81 mg daily, and Plavix 75 mg daily.  He follows with Dr Delice Lesch periodically for memory changes.  Echo: LVEF 55-60%.  Wall motion was normal, no regional wall motion abnormalities.  Normal left ventricular diastolic function.  He was referred to [facility] from [place], cervical spine stenosis.  He is living with his daughter.  Today he started mild productive cough with yellow sputum. No sick contacts or chills. He denies dyspnea or wheezing. History of COPD. Currently he is on Albuterol inh prn.    Review of Systems  Constitutional: Positive for fatigue (No more than  usual). Negative for activity change, appetite change and fever.  HENT: Negative for nosebleeds, sore throat and trouble swallowing.   Eyes: Negative for redness and visual disturbance.  Respiratory: Positive for cough. Negative for shortness of breath and wheezing.   Cardiovascular: Negative for chest pain, palpitations and leg swelling.  Gastrointestinal: Negative for abdominal pain, nausea and vomiting.  Genitourinary: Negative for decreased urine volume, dysuria and hematuria.  Musculoskeletal: Positive for arthralgias and myalgias. Negative for gait problem.  Neurological: Negative for seizures, facial asymmetry, weakness and headaches.  Psychiatric/Behavioral: Negative for confusion and hallucinations. The patient is nervous/anxious.       Current Outpatient Medications on File Prior to Visit  Medication Sig Dispense Refill  . albuterol (PROVENTIL HFA;VENTOLIN HFA) 108 (90 Base) MCG/ACT inhaler Inhale 2 puffs into the lungs as needed for wheezing or shortness of breath.    . alfuzosin (UROXATRAL) 10 MG 24 hr tablet Take 10 mg by mouth at bedtime.  11  . amLODipine (NORVASC) 10 MG tablet Take 5 mg by mouth daily.      Marland Kitchen ammonium lactate (LAC-HYDRIN) 12 % lotion Apply 1 application topically 2 (two) times daily as needed for dry skin.    Marland Kitchen aspirin EC 81 MG tablet Take 1 tablet (81 mg total) by mouth daily. 90 tablet 3  . Calcium Carb-Cholecalciferol (CALCIUM-VITAMIN D) 500-200 MG-UNIT tablet Take 1 tablet by mouth daily.    . cholecalciferol (VITAMIN D) 1000 UNITS tablet  Take 1,000 Units by mouth daily.    . clopidogrel (PLAVIX) 75 MG tablet Take 1 tablet (75 mg total) by mouth daily. 30 tablet 3  . cyclobenzaprine (FLEXERIL) 10 MG tablet Take 1 tablet (10 mg total) by mouth at bedtime. 30 tablet   . escitalopram (LEXAPRO) 10 MG tablet Take 30 mg by mouth every morning.     . gabapentin (NEURONTIN) 300 MG capsule Take 600 mg by mouth 2 (two) times daily.    . hydrOXYzine (ATARAX) 50 MG  tablet Take 50 mg by mouth 2 (two) times daily.     Marland Kitchen levothyroxine (SYNTHROID, LEVOTHROID) 75 MCG tablet Take 1 tablet (75 mcg total) by mouth daily before breakfast.    . mirtazapine (REMERON) 15 MG tablet Take 1 tablet (15 mg total) by mouth at bedtime. 30 tablet 2  . multivitamin (THERAGRAN) per tablet Take 1 tablet by mouth daily.      Marland Kitchen OLANZapine (ZYPREXA) 10 MG tablet Take 5 mg by mouth at bedtime.    . Omega-3 Fatty Acids (FISH OIL) 1000 MG CAPS Take 1,000 mg by mouth 2 (two) times daily.     Marland Kitchen omeprazole (PRILOSEC) 40 MG capsule Take 40 mg by mouth daily.    . pravastatin (PRAVACHOL) 40 MG tablet Take 40 mg by mouth at bedtime.    . vitamin E 600 UNIT capsule Take 600 Units by mouth daily.      Marland Kitchen zolpidem (AMBIEN) 10 MG tablet Take 5 mg by mouth at bedtime.     No current facility-administered medications on file prior to visit.      Past Medical History:  Diagnosis Date  . Alcoholism in family   . Anxiety   . Arthritis   . Depressive disorder, not elsewhere classified   . Diverticulosis of colon (without mention of hemorrhage)   . Dysfunction of eustachian tube   . Exposure to STD   . Family history of diabetes mellitus   . Family history of psychiatric condition    family Hx of suicide   . GERD (gastroesophageal reflux disease)   . Impotence of organic origin    erectile dysfunction  . Lipoprotein deficiencies   . Other diseases of lung, not elsewhere classified    pulmonary nodule  . Peripheral vascular disease, unspecified ([place])    lower extremity peripheral artery disease  . Personal history of colonic polyps   . Routine general medical examination at a health care facility   . Seizures ([place])   . Sprain of neck    Hx  . Thyroid disease   . Tobacco use disorder   . Unspecified hypertensive kidney disease with chronic kidney disease stage I through stage IV, or unspecified(403.90)    cholesterol embolization syndrome   Allergies  Allergen Reactions  . Tape  Other (See Comments)    "Plastic" tape TEARS THE SKIN; please use paper tape or Coban wrap!!  . Neomycin-Bacitracin Zn-Polymyx Rash  . Niacin Rash  . Paroxetine Other (See Comments)    Makes the patient feel SEVERELY SEDATED/LETHARGIC/CANNOT SPEAK WORDS    Social History   Socioeconomic History  . Marital status: Single    Spouse name: None  . Number of children: 1  . Years of education: None  . Highest education level: None  Social Needs  . Financial resource strain: None  . Food insecurity - worry: None  . Food insecurity - inability: None  . Transportation needs - medical: None  . Transportation needs - non-medical: None  Occupational History  . Occupation: retired  Tobacco Use  . Smoking status: Former Smoker    Types: Cigarettes    Start date: 07/22/2016  . Smokeless tobacco: Never Used  . Tobacco comment: QUIT 2009  Substance and Sexual Activity  . Alcohol use: Yes    Alcohol/week: 0.0 oz    Comment: social  . Drug use: Yes    Types: Marijuana  . Sexual activity: None  Other Topics Concern  . None  Social History Narrative   No diet, not restricting cholesterol.    Vitals:   04/23/17 1027  BP: (!) 144/90  Pulse: 72  Resp: 12  Temp: 98.2 F (36.8 C)  SpO2: 98%   Body mass index is 22.97 kg/m.    Physical Exam  Nursing note and vitals reviewed. Constitutional: He is oriented to person, place, and time. He appears well-developed and well-nourished. No distress.  HENT:  Head: Normocephalic and atraumatic.  Mouth/Throat: Oropharynx is clear and moist and mucous membranes are normal.  Eyes: Conjunctivae are normal. Pupils are equal, round, and reactive to light.  Cardiovascular: Normal rate and regular rhythm.  No murmur heard. Pulses:      Dorsalis pedis pulses are 2+ on the right side, and 2+ on the left side.  Respiratory: Effort normal and breath sounds normal. No respiratory distress.  GI: Soft. He exhibits no mass. There is no hepatomegaly.  There is no tenderness.  Musculoskeletal: He exhibits no edema or tenderness.  Lymphadenopathy:    He has no cervical adenopathy.  Neurological: He is alert and oriented to person, place, and time. He has normal strength. No cranial nerve deficit.  Minimal tremor noted of hands and head. Mild unstable gait with no assistance.   Skin: Skin is warm. No erythema.  Psychiatric: His mood appears anxious.  Well groomed, good eye contact.     ASSESSMENT AND PLAN:   Mr. Norfleet was seen today for hospitalization follow-up.  Diagnoses and all orders for this visit:  TIA (transient ischemic attack)  Educated about Dx and prevention of future events.  Continue Plavix and Aspirin,side effects discussed. Instructed about warning signs. He is going to schedule F/U appt with neuro through the [place].   Minimal tremor noted today, ? Essential tremor. Some if his meds could also aggravate problem. He will monitor for now. Instructed about warning signs. F/U with neuro.  PAD (peripheral artery disease) (HCC) Continue Aspirin,Plavix, and Pravastatin. Adequate HTN control. Educated about side effects of medications. Continue following with Dr Arida,cardiologist.  Essential hypertension Today mildly elevated, re-check 148/85. Instructed to monitor BP at home. Possible complications of elevated BP discussed. Low salt diet. No changes for now. F/U in 3-4 months.   Hyperlipidemia Goal < 100 ideally < 70. continue Pravastatin 40 mg daily. Low-fat diet also recommended. We will recheck lipid panel in 3-4 months.    Chronic obstructive pulmonary disease, unspecified COPD type ([place])  Today lung auscultation negative for wheezing or rales. OTC Plain Mucinex may help with cough. Albuterol inh 2 puff every 6 hours for a week then as needed for wheezing or shortness of breath.  Monitor for new symptoms. Instructed about warning signs.      Armani Brar G. [name], MD  [facility]. [place] office.

## 2017-04-23 ENCOUNTER — Ambulatory Visit (INDEPENDENT_AMBULATORY_CARE_PROVIDER_SITE_OTHER): Payer: Medicare Other | Admitting: Family Medicine

## 2017-04-23 ENCOUNTER — Encounter: Payer: Self-pay | Admitting: Family Medicine

## 2017-04-23 VITALS — BP 144/90 | HR 72 | Temp 98.2°F | Resp 12 | Ht 73.0 in | Wt 174.1 lb

## 2017-04-23 DIAGNOSIS — I739 Peripheral vascular disease, unspecified: Secondary | ICD-10-CM

## 2017-04-23 DIAGNOSIS — E785 Hyperlipidemia, unspecified: Secondary | ICD-10-CM

## 2017-04-23 DIAGNOSIS — G459 Transient cerebral ischemic attack, unspecified: Secondary | ICD-10-CM

## 2017-04-23 DIAGNOSIS — I10 Essential (primary) hypertension: Secondary | ICD-10-CM | POA: Diagnosis not present

## 2017-04-23 DIAGNOSIS — J449 Chronic obstructive pulmonary disease, unspecified: Secondary | ICD-10-CM

## 2017-04-23 NOTE — Assessment & Plan Note (Signed)
Today mildly elevated, re-check 148/85. Instructed to monitor BP at home. Possible complications of elevated BP discussed. Low salt diet. No changes for now. F/U in 3-4 months.

## 2017-04-23 NOTE — Patient Instructions (Addendum)
A few things to remember from today's visit:   TIA (transient ischemic attack)  PAD (peripheral artery disease) ([place])  Hyperlipidemia, unspecified hyperlipidemia type  Essential hypertension  No changes today. We will re-check cholesterol in 3-4 months.  Appt with neuro in 4-5 weeks.  Plain Mucinex for cough.   Please be sure medication list is accurate. If a new problem present, please set up appointment sooner than planned today.

## 2017-04-23 NOTE — Assessment & Plan Note (Signed)
Continue Aspirin,Plavix, and Pravastatin. Adequate HTN control. Educated about side effects of medications. Continue following with Dr Arida,cardiologist.

## 2017-04-23 NOTE — Assessment & Plan Note (Signed)
Goal < 100 ideally < 70. continue Pravastatin 40 mg daily. Low-fat diet also recommended. We will recheck lipid panel in 3-4 months.

## 2017-04-27 ENCOUNTER — Encounter: Payer: Self-pay | Admitting: Family Medicine

## 2017-04-28 IMAGING — US US ABDOMEN COMPLETE
1 series · 14 of 25 positions shown · non-contrast
Comparison: CT scan of January 30, 2014.

CLINICAL DATA: Right upper quadrant abdominal pain for 2 months.

EXAM:
ULTRASOUND ABDOMEN COMPLETE

[Series 1: us abdomen complete · 0.24mm/px · 14 of 76 slices shown]
[im 1/76]
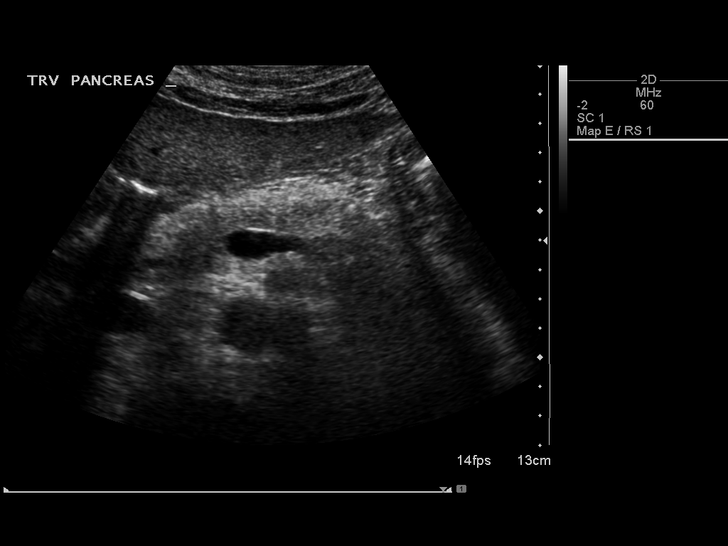
[im 7/76]
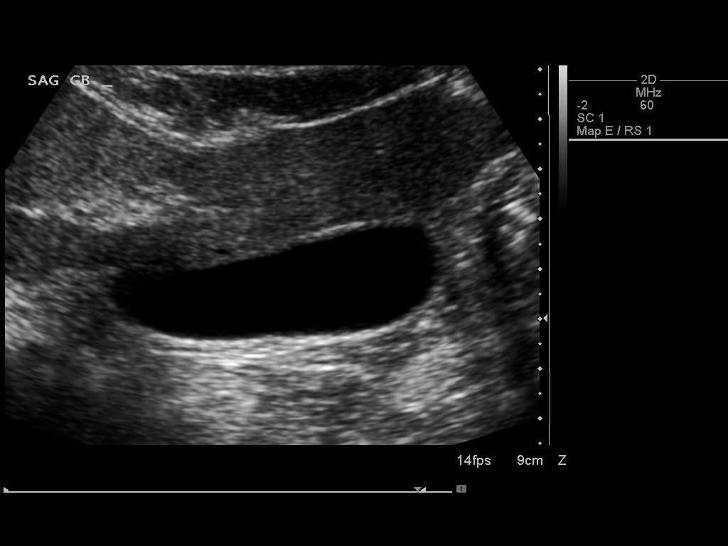
[im 13/76]
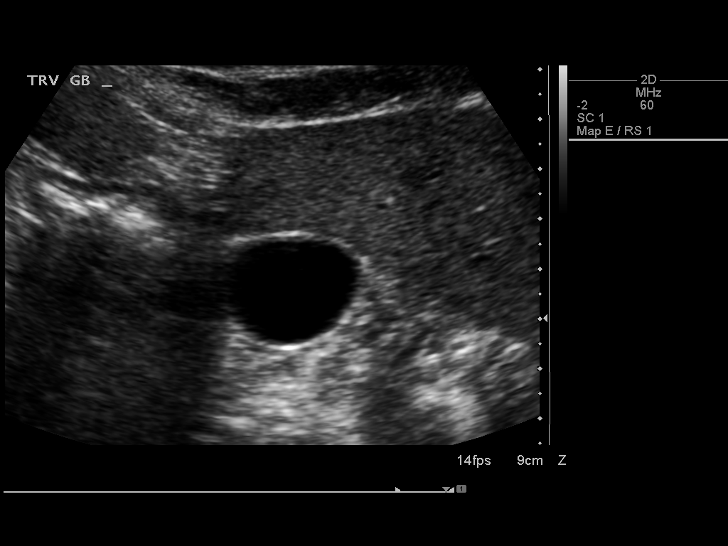
[im 19/76]
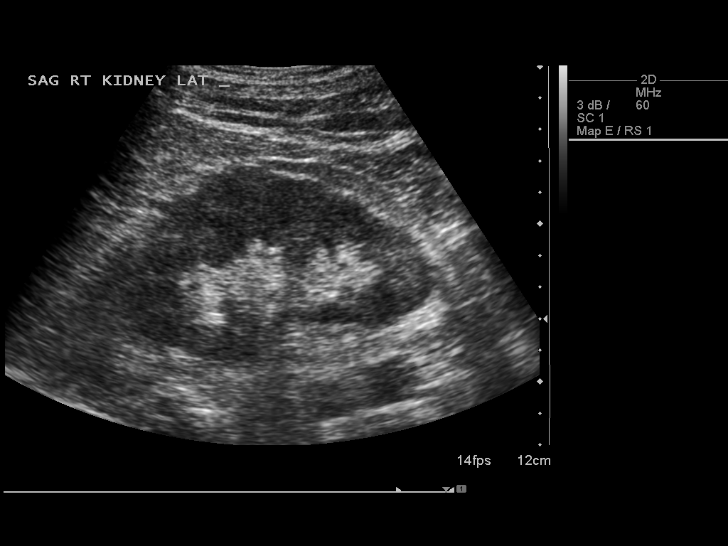
[im 26/76]
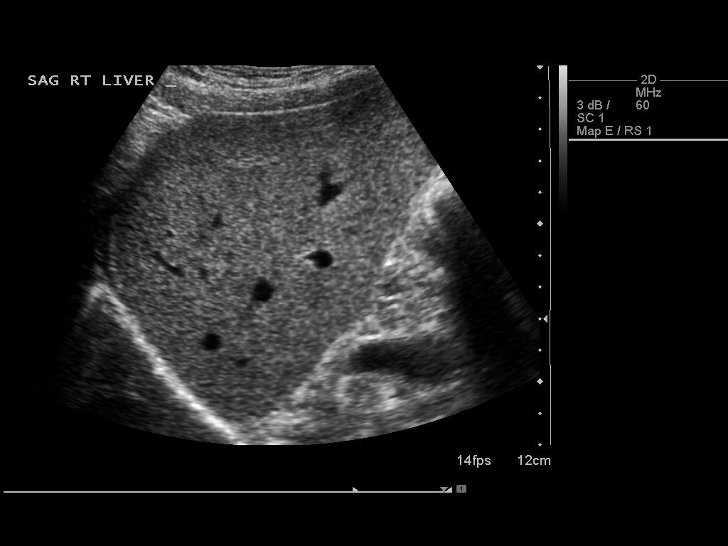
[im 29/76]
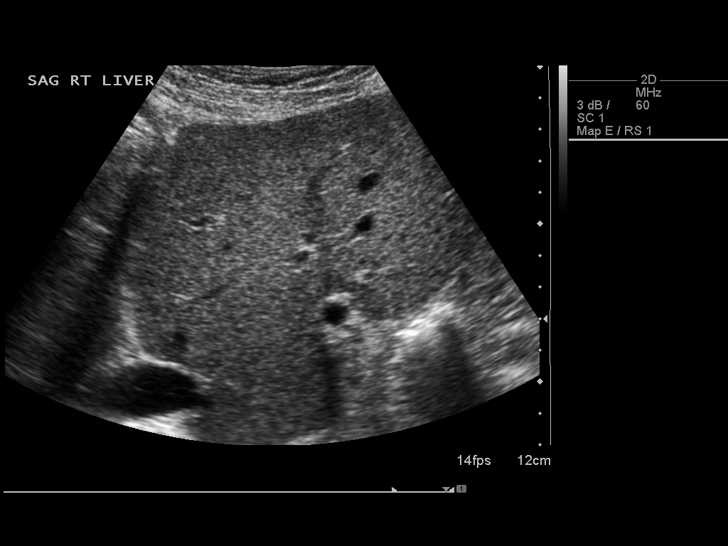
[im 35/76]
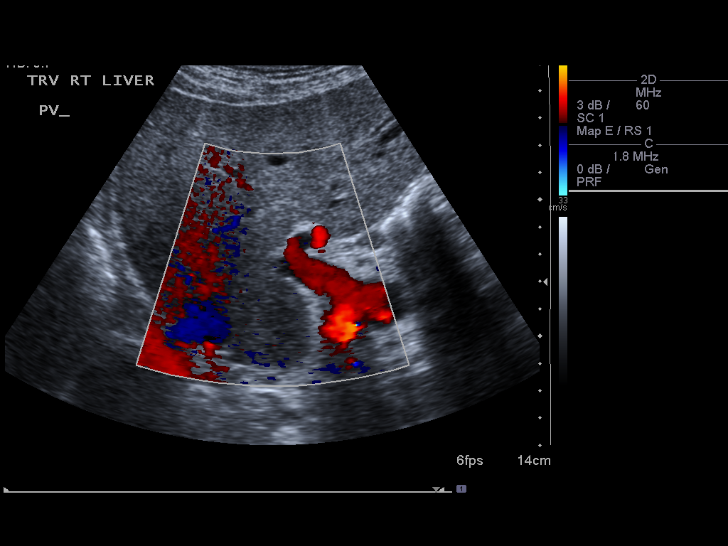
[im 41/76]
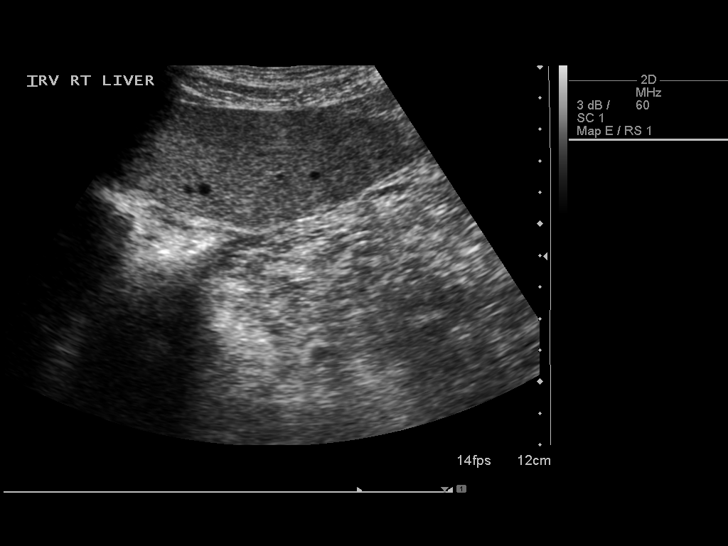
[im 47/76]
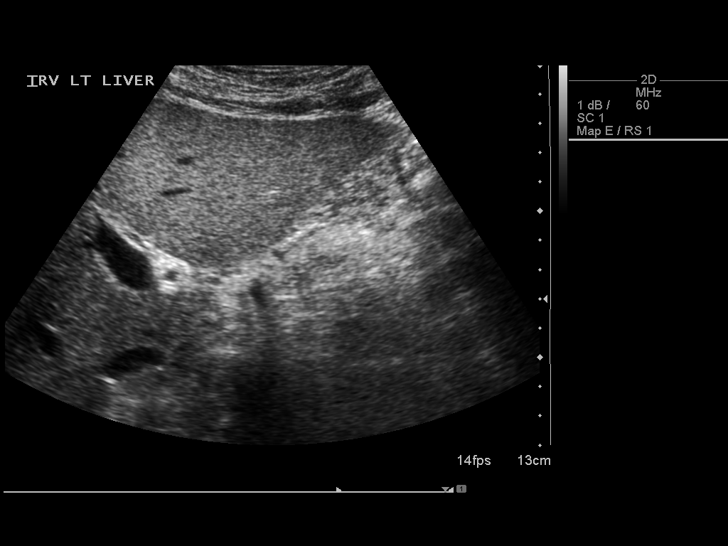
[im 51/76]
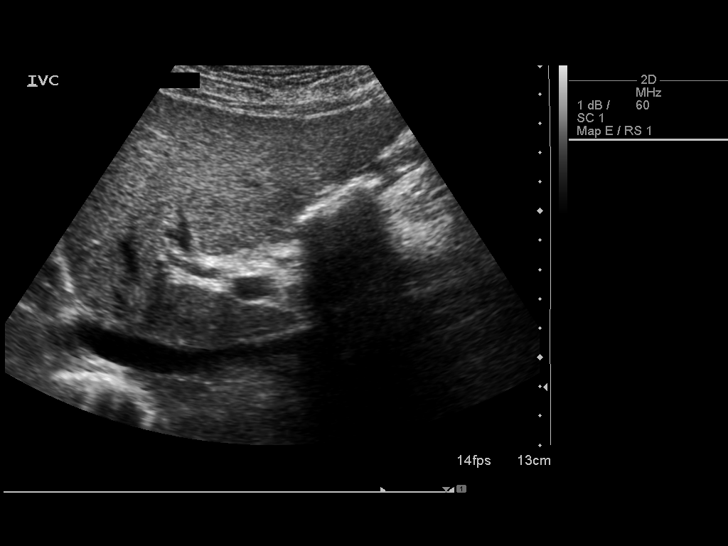
[im 57/76]
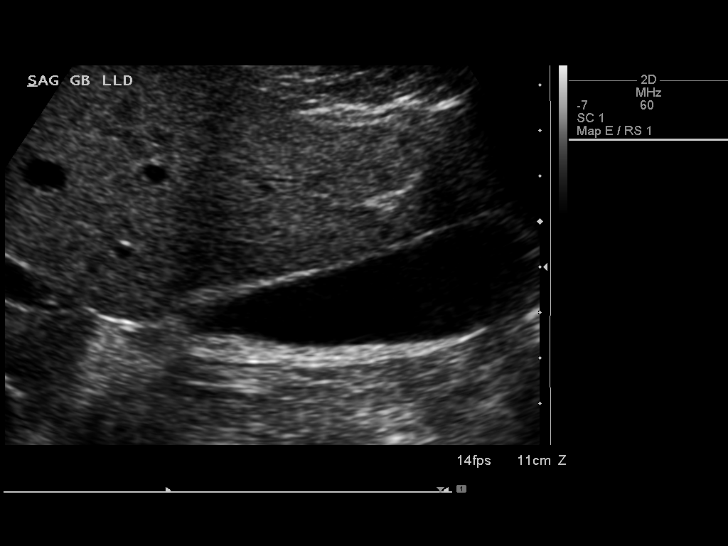
[im 63/76]
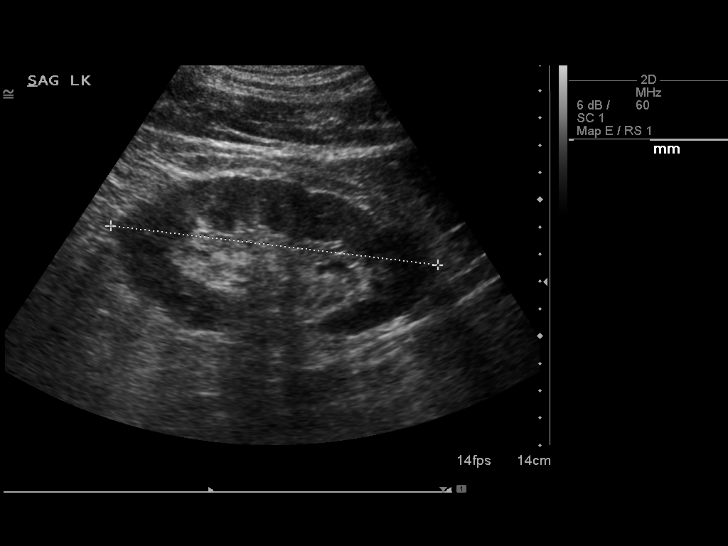
[im 69/76]
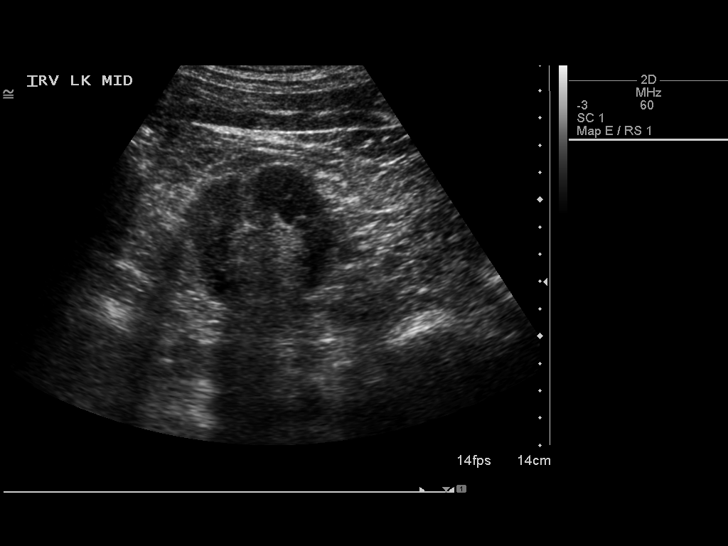
[im 76/76]
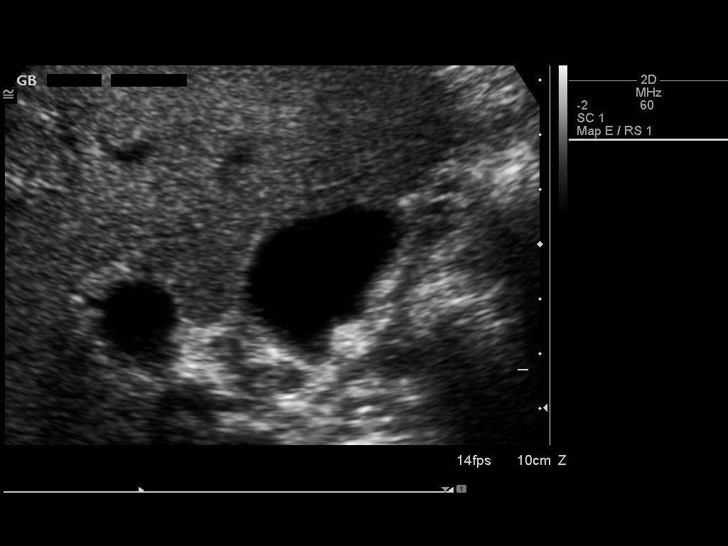

[14 of 25 positions shown; findings below may reference images not displayed]

FINDINGS: Gallbladder: No gallstones or wall thickening visualized. No
sonographic Murphy sign noted.

Common bile duct: Diameter: 3.4 mm which is within normal limits.

Liver: No focal lesion identified. Mildly inhomogeneous echogenicity
of hepatic parenchyma is noted.

IVC: No abnormality visualized.

Pancreas: Visualized portion unremarkable.

Spleen: Size and appearance within normal limits.

Right Kidney: Length: 10.7 cm. Echogenicity within normal limits. No
mass or hydronephrosis visualized.

Left Kidney: Length: 12.1 cm. Echogenicity within normal limits. No
mass or hydronephrosis visualized.

Abdominal aorta: No aneurysm visualized.

Other findings: None.
IMPRESSION: Mildly inhomogeneous echogenicity of hepatic parenchyma is noted
suggesting diffuse hepatocellular disease or possibly fatty
infiltration. No other abnormality seen in the abdomen.

## 2017-05-13 ENCOUNTER — Other Ambulatory Visit: Payer: Self-pay

## 2017-05-13 ENCOUNTER — Encounter: Payer: Self-pay | Admitting: Neurology

## 2017-05-13 ENCOUNTER — Ambulatory Visit (INDEPENDENT_AMBULATORY_CARE_PROVIDER_SITE_OTHER): Payer: Medicare Other | Admitting: Neurology

## 2017-05-13 VITALS — BP 130/82 | HR 88 | Ht 75.0 in | Wt 176.0 lb

## 2017-05-13 DIAGNOSIS — G459 Transient cerebral ischemic attack, unspecified: Secondary | ICD-10-CM | POA: Diagnosis not present

## 2017-05-13 NOTE — Patient Instructions (Addendum)
1. Continue all your current medications 2. Increase fluid hydration 3. Monitor BP regularly at the same time each day and make a log to show your doctor. Discuss gabapentin concerns with him as well. 4. If symptoms suddenly change, go to ER immediately 5. Follow-up as scheduled in September, call for any changes

## 2017-05-13 NOTE — Progress Notes (Signed)
NEUROLOGY FOLLOW UP OFFICE NOTE  David David [id number]  DOB: September 07, 1950  HISTORY OF PRESENT ILLNESS: I had the pleasure of seeing David David in follow-up in the neurology clinic on 05/13/2017.  The patient was last seen 5 months ago for memory changes. He presents today for an urgent visit after a transient episode of dysequilibrium last 04/15/17. He was standing in line at [facility] when he turned to walk and suddenly his balance started "getting screwy" and everything seemed to be going to the right. His hearing was not good and eyes were not focusing right (tunnel vision), he felt like he was going to pass out and had to hold on to a pole for 15 minutes. He felt like he was inebriated. There was no headache, focal numbness/tingling/weakness, he did not fall. He saw his cardiologist the next day and was sent to the ER and was admitted for stroke workup. I personally reviewed MRI brain without contrast which did not show any acute changes. There was mild chronic microvascular disease. CTA head and neck did not show any significant stenosis. Echocardiogram unremarkable, left atrium normal, with note that a PFO cannot be excluded. He was diagnosed with likely TIA versus presyncope, small vessel disease versus cardiac cause. He was continued on aspirin and Plavix. He denies any further similar symptoms since then. He reports issues with his neck and going to [facility] where he was told there was cervical stenosis but he was not a surgical candidate. He has pain when turning his head. He has a history of seizure in 2009 provoked by Wellbutrin, and states he was started on gabapentin at that time for seizure prophylaxis, but is unsure why he is still taking it. He had an EEG in the hospital which was normal. He reports checking his BP at home, usually SBP between 150-160, but in the doctor's office it is better usually.  HPI 03/30/2015: This is a pleasant 67 yo RH man with a history of peripheral  vascular disease, hypertension, hyperlipidemia, hypothyroidism, PTSD, who presented for evaluation of worsening memory loss. He has a strong family history of dementia in 3 siblings, who were diagnosed at age 69 or so. He and his daughter started noticing changes a little over a year ago, he would start a conversation and David what the conversation was about. He would start to say something and David what he was going to say, getting halfway through a sentence then the rest would not come out. He feels like his head and mouth are not working together. His daughter has noticed this as well, he gets blank when trying to process, with long pauses in conversations that have become more frequent. He denies getting lost driving, but has gotten confused a couple of times. One time he could not find his cousin's house because he forgot what road she lived on. He was eventually able to remember it after speaking to his daughter. He lives with his daughter and denies any missed medications or bill payments (he reports there are no bills to pay). His daughter reports he is pretty well-organized, he denies misplacing things frequently. She reports he gets a little more frustrated easily, but no paranoia any different from his history of PTSD.   He has blurred vision from a right cataract, occasional neck and back pain, on and off constipation. He has numbness in his feet, no pain.  No significant head injuries. He reports having a seizure in 2009 when he was on a  significant amount of Wellbutrin for PTSD. He has PTSD as a [place] veteran. He drinks a glass of wine or beer 1-2 times a week. His older sister was diagnosed with dementia at age 66 or 57 and passed away 2 years ago. His brother now 69 has dementia. His sister is 50 and has dementia. He "does not want to get the way they are if he can get help now."   Laboratory Data: He reports having TSH done at the [place] in October with normal TSH. I personally reviewed  MRI brain without contrast which did not show any acute changes, there was mild diffuse atrophy. His B12 was normal.   PAST MEDICAL HISTORY: Past Medical History:  Diagnosis Date  . Alcoholism in family   . Anxiety   . Arthritis   . Depressive disorder, not elsewhere classified   . Diverticulosis of colon (without mention of hemorrhage)   . Dysfunction of eustachian tube   . Exposure to STD   . Family history of diabetes mellitus   . Family history of psychiatric condition    family Hx of suicide   . GERD (gastroesophageal reflux disease)   . Impotence of organic origin    erectile dysfunction  . Lipoprotein deficiencies   . Other diseases of lung, not elsewhere classified    pulmonary nodule  . Peripheral vascular disease, unspecified ([place])    lower extremity peripheral artery disease  . Personal history of colonic polyps   . Routine general medical examination at a health care facility   . Seizures ([place])   . Sprain of neck    Hx  . Thyroid disease   . Tobacco use disorder   . Unspecified hypertensive kidney disease with chronic kidney disease stage I through stage IV, or unspecified(403.90)    cholesterol embolization syndrome    MEDICATIONS: Current Outpatient Medications on File Prior to Visit  Medication Sig Dispense Refill  . albuterol (PROVENTIL HFA;VENTOLIN HFA) 108 (90 Base) MCG/ACT inhaler Inhale 2 puffs into the lungs as needed for wheezing or shortness of breath.    . alfuzosin (UROXATRAL) 10 MG 24 hr tablet Take 10 mg by mouth at bedtime.  11  . amLODipine (NORVASC) 10 MG tablet Take 5 mg by mouth daily.      Marland Kitchen ammonium lactate (LAC-HYDRIN) 12 % lotion Apply 1 application topically 2 (two) times daily as needed for dry skin.    Marland Kitchen aspirin EC 81 MG tablet Take 1 tablet (81 mg total) by mouth daily. 90 tablet 3  . Calcium Carb-Cholecalciferol (CALCIUM-VITAMIN D) 500-200 MG-UNIT tablet Take 1 tablet by mouth daily.    . cholecalciferol (VITAMIN D) 1000 UNITS tablet  Take 1,000 Units by mouth daily.    . clopidogrel (PLAVIX) 75 MG tablet Take 1 tablet (75 mg total) by mouth daily. 30 tablet 3  . cyclobenzaprine (FLEXERIL) 10 MG tablet Take 1 tablet (10 mg total) by mouth at bedtime. 30 tablet   . escitalopram (LEXAPRO) 10 MG tablet Take 30 mg by mouth every morning.     . gabapentin (NEURONTIN) 300 MG capsule Take 600 mg by mouth 2 (two) times daily.    . hydrOXYzine (ATARAX) 50 MG tablet Take 50 mg by mouth 2 (two) times daily.     Marland Kitchen levothyroxine (SYNTHROID, LEVOTHROID) 75 MCG tablet Take 1 tablet (75 mcg total) by mouth daily before breakfast.    . mirtazapine (REMERON) 15 MG tablet Take 1 tablet (15 mg total) by mouth at bedtime. 30 tablet  2  . multivitamin (THERAGRAN) per tablet Take 1 tablet by mouth daily.      Marland Kitchen OLANZapine (ZYPREXA) 10 MG tablet Take 5 mg by mouth at bedtime.    . Omega-3 Fatty Acids (FISH OIL) 1000 MG CAPS Take 1,000 mg by mouth 2 (two) times daily.     Marland Kitchen omeprazole (PRILOSEC) 40 MG capsule Take 40 mg by mouth daily.    . pravastatin (PRAVACHOL) 40 MG tablet Take 40 mg by mouth at bedtime.    . vitamin E 600 UNIT capsule Take 600 Units by mouth daily.      Marland Kitchen zolpidem (AMBIEN) 10 MG tablet Take 5 mg by mouth at bedtime.     No current facility-administered medications on file prior to visit.     ALLERGIES: Allergies  Allergen Reactions  . Tape Other (See Comments)    "Plastic" tape TEARS THE SKIN; please use paper tape or Coban wrap!!  . Neomycin-Bacitracin Zn-Polymyx Rash  . Niacin Rash  . Paroxetine Other (See Comments)    Makes the patient feel SEVERELY SEDATED/LETHARGIC/CANNOT SPEAK WORDS    FAMILY HISTORY: Family History  Problem Relation Age of Onset  . CAD Father   . Congestive Heart Failure Mother   . Lung cancer Brother   . Alzheimer's disease Brother   . Diabetes Brother   . Alzheimer's disease Sister     SOCIAL HISTORY: Social History   Socioeconomic History  . Marital status: Single    Spouse  name: Not on file  . Number of children: 1  . Years of education: Not on file  . Highest education level: Not on file  Social Needs  . Financial resource strain: Not on file  . Food insecurity - worry: Not on file  . Food insecurity - inability: Not on file  . Transportation needs - medical: Not on file  . Transportation needs - non-medical: Not on file  Occupational History  . Occupation: retired  Tobacco Use  . Smoking status: Former Smoker    Types: Cigarettes    Start date: 07/22/2016  . Smokeless tobacco: Never Used  . Tobacco comment: QUIT 2009  Substance and Sexual Activity  . Alcohol use: Yes    Alcohol/week: 0.0 oz    Comment: social  . Drug use: Yes    Types: Marijuana  . Sexual activity: Not on file  Other Topics Concern  . Not on file  Social History Narrative   No diet, not restricting cholesterol.    REVIEW OF SYSTEMS: Constitutional: No fevers, chills, or sweats, no generalized fatigue, change in appetite Eyes: No visual changes, double vision, eye pain Ear, nose and throat: No hearing loss, ear pain, nasal congestion, sore throat Cardiovascular: No chest pain, palpitations Respiratory:  No shortness of breath at rest or with exertion, wheezes GastrointestinaI: No nausea, vomiting, diarrhea, abdominal pain, fecal incontinence Genitourinary:  No dysuria, urinary retention or frequency Musculoskeletal:  + neck pain, back pain Integumentary: No rash, pruritus, skin lesions Neurological: as above Psychiatric: No depression, insomnia, anxiety Endocrine: No palpitations, fatigue, diaphoresis, mood swings, change in appetite, change in weight, increased thirst Hematologic/Lymphatic:  No anemia, purpura, petechiae. Allergic/Immunologic: no itchy/runny eyes, nasal congestion, recent allergic reactions, rashes  PHYSICAL EXAM: Vitals:   05/13/17 1315  BP: 130/82  Pulse: 88  SpO2: 97%   General: No acute distress Head:   Normocephalic/atraumatic Skin/Extremities: No rash, no edema Neurological Exam: alert and oriented to person, place, and time. No aphasia or dysarthria. Fund of knowledge is  appropriate.  Recent and remote memory are intact.  Attention and concentration are normal.    Able to name objects and repeat phrases. Cranial nerves: Pupils equal, round, reactive to light.  Extraocular movements intact with no nystagmus. Visual fields full. Facial sensation intact. No facial asymmetry. Tongue, uvula, palate midline.  Motor: Bulk and tone normal, muscle strength 5/5 throughout with no pronator drift.  Sensation to light touch intact.  No extinction to double simultaneous stimulation.  Deep tendon reflexes 2+ throughout, toes downgoing.  Finger to nose, heel to shin testing intact, no dysdiadochokinesia.  Gait narrow-based and steady, able to tandem walk adequately.  Romberg negative.   IMPRESSION: This is a pleasant 67 yo RH man with vascular risk factors including hypertension, hyperlipidemia, peripheral vascular disease, strong family history of dementia, who I had been seeing for memory issues, presenting for urgent follow-up after a transient episode of dysequilibrium to the right side with associated hearing and vision changes last 04/15/17. MRI negative for acute infarct, stroke workup negative. Symptoms suggestive of possible TIA versus presyncopal event, we discussed continued control of vascular risk factors, aspirin, Plavix, increasing fluid intake. He is asking about his gabapentin and will [facility]. He knows to go to the ER immediately for any sudden changes in symptoms. He was advised to monitor BP regularly at home and discuss with PCP. He will follow-up as scheduled in September 2019 and knows to call for any changes.  Thank you for allowing me to participate in his care.  Please do not hesitate to call for any questions or concerns.  The duration of this appointment visit was 25  minutes of face-to-face time with the patient.  Greater than 50% of this time was spent in counseling, explanation of diagnosis, planning of further management, and coordination of care.   David David, M.D.   CC: Dr. [name], [facility]

## 2017-06-20 ENCOUNTER — Encounter: Payer: Self-pay | Admitting: Family Medicine

## 2017-06-20 ENCOUNTER — Ambulatory Visit (INDEPENDENT_AMBULATORY_CARE_PROVIDER_SITE_OTHER): Payer: Medicare Other | Admitting: Family Medicine

## 2017-06-20 VITALS — BP 120/60 | HR 84 | Temp 97.9°F | Wt 175.0 lb

## 2017-06-20 DIAGNOSIS — H6983 Other specified disorders of Eustachian tube, bilateral: Secondary | ICD-10-CM

## 2017-06-20 DIAGNOSIS — R0982 Postnasal drip: Secondary | ICD-10-CM

## 2017-06-20 MED ORDER — CETIRIZINE HCL 10 MG PO TABS: 10.0000 mg | ORAL_TABLET | Freq: Every day | ORAL | 0 refills | Status: DC

## 2017-06-20 MED ORDER — FLUTICASONE PROPIONATE 50 MCG/ACT NA SUSP: 1.0000 | Freq: Every day | NASAL | 0 refills | Status: DC

## 2017-06-20 NOTE — Progress Notes (Signed)
Subjective:    Patient ID: David David, male    DOB: 1951-02-07, 67 y.o.   MRN: [id number]  Chief Complaint  Patient presents with  . Nasal Congestion    HPI Patient was seen today for ongoing concern.  Patient endorses nasal drainage and a "sore nose" times 2 weeks.  P denies cough, ST, fever, facial pressure or sick contacts.  Pt has tried saline nasal rinse and put cortisone on his nose.  Past Medical History:  Diagnosis Date  . Alcoholism in family   . Anxiety   . Arthritis   . Depressive disorder, not elsewhere classified   . Diverticulosis of colon (without mention of hemorrhage)   . Dysfunction of eustachian tube   . Exposure to STD   . Family history of diabetes mellitus   . Family history of psychiatric condition    family Hx of suicide   . GERD (gastroesophageal reflux disease)   . Impotence of organic origin    erectile dysfunction  . Lipoprotein deficiencies   . Other diseases of lung, not elsewhere classified    pulmonary nodule  . Peripheral vascular disease, unspecified ([place])    lower extremity peripheral artery disease  . Personal history of colonic polyps   . Routine general medical examination at a health care facility   . Seizures ([place])   . Sprain of neck    Hx  . Thyroid disease   . Tobacco use disorder   . Unspecified hypertensive kidney disease with chronic kidney disease stage I through stage IV, or unspecified(403.90)    cholesterol embolization syndrome    Allergies  Allergen Reactions  . Tape Other (See Comments)    "Plastic" tape TEARS THE SKIN; please use paper tape or Coban wrap!!  . Neomycin-Bacitracin Zn-Polymyx Rash  . Niacin Rash  . Paroxetine Other (See Comments)    Makes the patient feel SEVERELY SEDATED/LETHARGIC/CANNOT SPEAK WORDS    ROS General: Denies fever, chills, night sweats, changes in weight, changes in appetite HEENT: Denies headaches, ear pain, changes in vision, sore throat  +rhinorrhea, nasal soreness CV:  Denies CP, palpitations, SOB, orthopnea Pulm: Denies SOB, cough, wheezing GI: Denies abdominal pain, nausea, vomiting, diarrhea, constipation GU: Denies dysuria, hematuria, frequency, vaginal discharge Msk: Denies muscle cramps, joint pains Neuro: Denies weakness, numbness, tingling Skin: Denies rashes, bruising   Psych: Denies depression, anxiety, hallucinations     Objective:    Blood pressure 120/60, pulse 84, temperature 97.9 F (36.6 C), temperature source Oral, weight 175 lb (79.4 kg), SpO2 98 %.   Gen. Pleasant, well-nourished, in no distress, normal affect   HEENT: [place]/AT, face symmetric, no scleral icterus, PERRLA, nares patent with mild drainage, pharynx with post nasal drainage and mild erythema, no exudate.  TMs full b/l.  No cervical lymphadenopathy. Lungs: no accessory muscle use, CTAB, no wheezes or rales Cardiovascular: RRR, no m/r/g, no peripheral edema Skin:  Warm, no lesions/ rash.  Helix of L ear with a 8 mm hyperpigmented flat lesion    Wt Readings from Last 3 Encounters:  06/20/17 175 lb (79.4 kg)  05/13/17 176 lb (79.8 kg)  04/23/17 174 lb 2 oz (79 kg)    Lab Results  Component Value Date   WBC 7.5 04/16/2017   HGB 15.3 04/16/2017   HCT 45.0 04/16/2017   PLT 149 (L) 04/16/2017   GLUCOSE 87 04/16/2017   CHOL 126 04/17/2017   TRIG 150 (H) 04/17/2017   HDL 36 (L) 04/17/2017   LDLCALC 60 04/17/2017  ALT 26 04/16/2017   AST 31 04/16/2017   NA 144 04/16/2017   K 4.6 04/16/2017   CL 106 04/16/2017   CREATININE 0.90 04/16/2017   BUN 13 04/16/2017   CO2 25 04/16/2017   TSH 2.07 07/01/2016   PSA 1.66 07/29/2016   INR 0.97 04/16/2017   HGBA1C 6.1 (H) 04/17/2017    Assessment/Plan:  Dysfunction of both eustachian tubes  -given handout - Plan: fluticasone (FLONASE) 50 MCG/ACT nasal spray  Post-nasal drainage - Plan: fluticasone (FLONASE) 50 MCG/ACT nasal spray, cetirizine (ZYRTEC) 10 MG tablet  Skin lesion of L ear helix -hyperpigmented 12mm  area likely nevus. -stable x yrs per pt. -pt encouraged to monitor for changes. -continue f/u with Derm prn  F/u prn.     Grier Mitts, MD

## 2017-06-20 NOTE — Patient Instructions (Signed)
Eustachian Tube Dysfunction The eustachian tube connects the middle ear to the back of the nose. It regulates air pressure in the middle ear by allowing air to move between the ear and nose. It also helps to drain fluid from the middle ear space. When the eustachian tube does not function properly, air pressure, fluid, or both can build up in the middle ear. Eustachian tube dysfunction can affect one or both ears. What are the causes? This condition happens when the eustachian tube becomes blocked or cannot open normally. This may result from:  Ear infections.  Colds and other upper respiratory infections.  Allergies.  Irritation, such as from cigarette smoke or acid from the stomach coming up into the esophagus (gastroesophageal reflux).  Sudden changes in air pressure, such as from descending in an airplane.  Abnormal growths in the nose or throat, such as nasal polyps, tumors, or enlarged tissue at the back of the throat (adenoids).  What increases the risk? This condition may be more likely to develop in people who smoke and people who are overweight. Eustachian tube dysfunction may also be more likely to develop in children, especially children who have:  Certain birth defects of the mouth, such as cleft palate.  Large tonsils and adenoids.  What are the signs or symptoms? Symptoms of this condition may include:  A feeling of fullness in the ear.  Ear pain.  Clicking or popping noises in the ear.  Ringing in the ear.  Hearing loss.  Loss of balance.  Symptoms may get worse when the air pressure around you changes, such as when you travel to an area of high elevation or fly on an airplane. How is this diagnosed? This condition may be diagnosed based on:  Your symptoms.  A physical exam of your ear, nose, and throat.  Tests, such as those that measure: ? The movement of your eardrum (tympanogram). ? Your hearing (audiometry).  How is this treated? Treatment  depends on the cause and severity of your condition. If your symptoms are mild, you may be able to relieve your symptoms by moving air into ("popping") your ears. If you have symptoms of fluid in your ears, treatment may include:  Decongestants.  Antihistamines.  Nasal sprays or ear drops that contain medicines that reduce swelling (steroids).  In some cases, you may need to have a procedure to drain the fluid in your eardrum (myringotomy). In this procedure, a small tube is placed in the eardrum to:  Drain the fluid.  Restore the air in the middle ear space.  Follow these instructions at home:  Take over-the-counter and prescription medicines only as told by your health care provider.  Use techniques to help pop your ears as recommended by your health care provider. These may include: ? Chewing gum. ? Yawning. ? Frequent, forceful swallowing. ? Closing your mouth, holding your nose closed, and gently blowing as if you are trying to blow air out of your nose.  Do not do any of the following until your health care provider approves: ? Travel to high altitudes. ? Fly in airplanes. ? Work in a pressurized cabin or room. ? Scuba dive.  Keep your ears dry. Dry your ears completely after showering or bathing.  Do not smoke.  Keep all follow-up visits as told by your health care provider. This is important. Contact a health care provider if:  Your symptoms do not go away after treatment.  Your symptoms come back after treatment.  You are   unable to pop your ears.  You have: ? A fever. ? Pain in your ear. ? Pain in your head or neck. ? Fluid draining from your ear.  Your hearing suddenly changes.  You become very dizzy.  You lose your balance. This information is not intended to replace advice given to you by your health care provider. Make sure you discuss any questions you have with your health care provider. Document Released: 03/31/2015 Document Revised: 08/10/2015  Document Reviewed: 03/23/2014 Elsevier Interactive Patient Education  2018 [place] Drip Postnasal drip is the feeling of mucus going down the back of your throat. Mucus is a slimy substance that moistens and cleans your nose and throat, as well as the air pockets in face bones near your forehead and cheeks (sinuses). Small amounts of mucus pass from your nose and sinuses down the back of your throat all the time. This is normal. When you produce too much mucus or the mucus gets too thick, you can feel it. Some common causes of postnasal drip include:  Having more mucus because of: ? A cold or the flu. ? Allergies. ? Cold air. ? Certain medicines.  Having more mucus that is thicker because of: ? A sinus or nasal infection. ? Dry air. ? A food allergy.  Follow these instructions at home: Relieving discomfort  Gargle with a salt-water mixture 3-4 times a day or as needed. To make a salt-water mixture, completely dissolve -1 tsp of salt in 1 cup of warm water.  If the air in your home is dry, use a humidifier to add moisture to the air.  Use a saline spray or container (neti pot) to flush out the nose (nasal irrigation). These methods can help clear away mucus and keep the nasal passages moist. General instructions  Take over-the-counter and prescription medicines only as told by your health care provider.  Follow instructions from your health care provider about eating or drinking restrictions. You may need to avoid caffeine.  Avoid things that you know you are allergic to (allergens), like dust, mold, pollen, pets, or certain foods.  Drink enough fluid to keep your urine pale yellow.  Keep all follow-up visits as told by your health care provider. This is important. Contact a health care provider if:  You have a fever.  You have a sore throat.  You have difficulty swallowing.  You have headache.  You have sinus pain.  You have a cough that does not go  away.  The mucus from your nose becomes thick and is green or yellow in color.  You have cold or flu symptoms that last more than 10 days. Summary  Postnasal drip is the feeling of mucus going down the back of your throat.  If your health care provider approves, use nasal irrigation or a nasal spray 2?4 times a day.  Avoid things that you know you are allergic to (allergens), like dust, mold, pollen, pets, or certain foods. This information is not intended to replace advice given to you by your health care provider. Make sure you discuss any questions you have with your health care provider. Document Released: 06/17/2016 Document Revised: 06/17/2016 Document Reviewed: 06/17/2016 Elsevier Interactive Patient Education  [employer].

## 2017-07-06 NOTE — Progress Notes (Signed)
Subjective:   David David is a 67 y.o. male who presents for an Initial Medicare Annual Wellness Visit.  Reports health as good as it can be  Depression managed at present  Last ov 04/2017  Recent neurologist eval Living with his dtr Retried in 2006;   Diet BMI 21  Eats when he is hungry;  Goes out with friends at times A1c was 6.1  Discussed pre diabetes    Exercise 3 acres keeps him busy 4 dogs and 2 inside and 2 outside    Tobacco; former smoker; Quit 07/2016 Goes to the [place] for annual physicals  Will defer LDCT to the [place] per his request.  RH, Neuro  Has psych, GI, medical at [place] 2009 100% with PTSD  Also takes steroid shot for eczema every few months   There are no preventive care reminders to display for this patient.  Shingrix education given but will take at the [place] as well     Objective:    Today's Vitals   07/08/17 0910  BP: 138/70  Pulse: 66  SpO2: 96%  Weight: 174 lb 5 oz (79.1 kg)  Height: 6\' 3"  (1.905 m)   Body mass index is 21.79 kg/m.  Advanced Directives 07/08/2017 04/17/2017 04/16/2017 11/12/2015 07/19/2014 07/18/2014 01/30/2014  Does Patient Have a Medical Advance Directive? Yes Yes No No Yes No No  Type of Advance Directive - [place];Living will - - [place];Living will - -  Does patient want to make changes to medical advance directive? - No - Patient declined - - No - Patient declined - -  Copy of [place] in Chart? - - - - No - copy requested - -  Would patient like information on creating a medical advance directive? - - - No - patient declined information - - No - patient declined information  Pre-existing out of facility DNR order (yellow form or pink MOST form) - - - - - - -    Current Medications (verified) Outpatient Encounter Medications as of 07/08/2017  Medication Sig  . albuterol (PROVENTIL HFA;VENTOLIN HFA) 108 (90 Base) MCG/ACT inhaler  Inhale 2 puffs into the lungs as needed for wheezing or shortness of breath.  . alfuzosin (UROXATRAL) 10 MG 24 hr tablet Take 10 mg by mouth at bedtime.  Marland Kitchen amLODipine (NORVASC) 10 MG tablet Take 5 mg by mouth daily.    Marland Kitchen ammonium lactate (LAC-HYDRIN) 12 % lotion Apply 1 application topically 2 (two) times daily as needed for dry skin.  Marland Kitchen aspirin EC 81 MG tablet Take 1 tablet (81 mg total) by mouth daily.  . Calcium Carb-Cholecalciferol (CALCIUM-VITAMIN D) 500-200 MG-UNIT tablet Take 1 tablet by mouth daily.  . cetirizine (ZYRTEC) 10 MG tablet Take 1 tablet (10 mg total) by mouth daily.  . cholecalciferol (VITAMIN D) 1000 UNITS tablet Take 1,000 Units by mouth daily.  . clopidogrel (PLAVIX) 75 MG tablet Take 1 tablet (75 mg total) by mouth daily.  . cyclobenzaprine (FLEXERIL) 10 MG tablet Take 1 tablet (10 mg total) by mouth at bedtime.  Marland Kitchen escitalopram (LEXAPRO) 10 MG tablet Take 30 mg by mouth every morning.   . fluticasone (FLONASE) 50 MCG/ACT nasal spray Place 1 spray into both nostrils daily.  Marland Kitchen gabapentin (NEURONTIN) 300 MG capsule Take 600 mg by mouth 2 (two) times daily.  . hydrOXYzine (ATARAX) 50 MG tablet Take 50 mg by mouth 2 (two) times daily.   Marland Kitchen  levothyroxine (SYNTHROID, LEVOTHROID) 75 MCG tablet Take 1 tablet (75 mcg total) by mouth daily before breakfast.  . mirtazapine (REMERON) 15 MG tablet Take 1 tablet (15 mg total) by mouth at bedtime.  . multivitamin (THERAGRAN) per tablet Take 1 tablet by mouth daily.    Marland Kitchen OLANZapine (ZYPREXA) 10 MG tablet Take 5 mg by mouth at bedtime.  . Omega-3 Fatty Acids (FISH OIL) 1000 MG CAPS Take 1,000 mg by mouth 2 (two) times daily.   Marland Kitchen omeprazole (PRILOSEC) 40 MG capsule Take 40 mg by mouth daily.  . pravastatin (PRAVACHOL) 40 MG tablet Take 40 mg by mouth at bedtime.  . vitamin E 600 UNIT capsule Take 600 Units by mouth daily.    Marland Kitchen zolpidem (AMBIEN) 10 MG tablet Take 5 mg by mouth at bedtime.   No facility-administered encounter medications on  file as of 07/08/2017.     Allergies (verified) Tape; Neomycin-bacitracin zn-polymyx; Niacin; and Paroxetine   History: Past Medical History:  Diagnosis Date  . Alcoholism in family   . Anxiety   . Arthritis   . Depressive disorder, not elsewhere classified   . Diverticulosis of colon (without mention of hemorrhage)   . Dysfunction of eustachian tube   . Exposure to STD   . Family history of diabetes mellitus   . Family history of psychiatric condition    family Hx of suicide   . GERD (gastroesophageal reflux disease)   . Impotence of organic origin    erectile dysfunction  . Lipoprotein deficiencies   . Other diseases of lung, not elsewhere classified    pulmonary nodule  . Peripheral vascular disease, unspecified ([place])    lower extremity peripheral artery disease  . Personal history of colonic polyps   . Routine general medical examination at a health care facility   . Seizures ([place])   . Sprain of neck    Hx  . Thyroid disease   . Tobacco use disorder   . Unspecified hypertensive kidney disease with chronic kidney disease stage I through stage IV, or unspecified(403.90)    cholesterol embolization syndrome   Past Surgical History:  Procedure Laterality Date  . angiography     bilateral lower extremity angiography, stenting of the left.   Marland Kitchen CARDIAC CATHETERIZATION N/A 07/19/2014   Procedure: Left Heart Cath and Coronary Angiography;  Surgeon: Leonie Man, MD;  Location: [facility] INVASIVE CV LAB CUPID;  Service: Cardiovascular;  Laterality: N/A;  . CATARACT EXTRACTION Bilateral   . EYE SURGERY  2016   blown macular  . KNEE ARTHROSCOPY Left    x 2  . LOWER EXTREMITY ANGIOGRAM N/A 11/04/2012   Procedure: LOWER EXTREMITY ANGIOGRAM;  Surgeon: Wellington Hampshire, MD;  Location: [facility] CATH LAB;  Service: Cardiovascular;  Laterality: N/A;  . POPLITEAL ARTERY ANGIOPLASTY     didnt specify angioplasty or stent  . SPERMATOCELECTOMY Left    testicular cyst  . VASECTOMY     Family  History  Problem Relation Age of Onset  . CAD Father   . Congestive Heart Failure Mother   . Lung cancer Brother   . Alzheimer's disease Brother   . Diabetes Brother   . Alzheimer's disease Sister    Social History   Socioeconomic History  . Marital status: Single    Spouse name: Not on file  . Number of children: 1  . Years of education: Not on file  . Highest education level: Not on file  Occupational History  . Occupation: retired  Science writer  Needs  . Financial resource strain: Not on file  . Food insecurity:    Worry: Not on file    Inability: Not on file  . Transportation needs:    Medical: Not on file    Non-medical: Not on file  Tobacco Use  . Smoking status: Former Smoker    Packs/day: 1.00    Years: 50.00    Pack years: 50.00    Types: Cigarettes    Start date: 07/22/2016  . Smokeless tobacco: Never Used  . Tobacco comment: has some off and on smoking - [facility]  Substance and Sexual Activity  . Alcohol use: Yes    Alcohol/week: 0.0 oz    Comment: social  . Drug use: Yes    Types: Marijuana  . Sexual activity: Not on file  Lifestyle  . Physical activity:    Days per week: Not on file    Minutes per session: Not on file  . Stress: Not on file  Relationships  . Social connections:    Talks on phone: Not on file    Gets together: Not on file    Attends religious service: Not on file    Active member of club or organization: Not on file    Attends meetings of clubs or organizations: Not on file    Relationship status: Not on file  Other Topics Concern  . Not on file  Social History Narrative   No diet, not restricting cholesterol.   Tobacco Counseling Counseling given: Yes Comment: has some off and on smoking - [facility]   Clinical Intake:  Activities of Daily Living In your present state of health, do you have any difficulty performing the following activities: 07/08/2017 04/17/2017  Hearing? N N  Vision? N N  Difficulty  concentrating or making decisions? N N  Walking or climbing stairs? N N  Dressing or bathing? N N  Doing errands, shopping? N N  Preparing Food and eating ? N -  Using the Toilet? N -  In the past six months, have you accidently leaked urine? N -  Do you have problems with loss of bowel control? N -  Managing your Medications? N -  Managing your Finances? N -  Housekeeping or managing your Housekeeping? N -  Some recent data might be hidden     Immunizations and Health Maintenance Immunization History  Administered Date(s) Administered  . Influenza, High Dose Seasonal PF 11/16/2016   There are no preventive care reminders to display for this patient.  Patient Care Team: [name], [name], MD as PCP - General (Family Medicine)  Indicate any recent Medical Services you may have received from other than [facility] providers in the past year (date may be approximate).    Assessment:   This is a routine wellness examination for David David.  Hearing/Vision screen  Hearing Screening   125Hz  250Hz  500Hz  1000Hz  2000Hz  3000Hz  4000Hz  6000Hz  8000Hz   Right ear:       100    Left ear:       100    Comments: Hearing issues;  None yes that he is aware of it   Vision Screening Comments: Vision check   [facility]zer [facility] as well Cataracts surgery on both eyes  Dietary issues and exercise activities discussed: Current Exercise Habits: Home exercise routine, Type of exercise: walking;strength training/weights(taking care of the yard; states he does stay active ), Time (Minutes): 60, Frequency (Times/Week): 4,  Weekly Exercise (Minutes/Week): 240, Intensity: Moderate  Goals    . Patient Stated     To maintain your health and continue to see your doctors      Depression Screen [facility] 2/9 Scores 07/08/2017 07/08/2017 10/03/2015 05/28/2015  PHQ - 2 Score 0 0 - 0  Exception Documentation - (No Data) Patient refusal -    Fall Risk Fall Risk  07/08/2017 05/13/2017 12/04/2016 12/06/2015 10/03/2015    Falls in the past year? Yes Yes No No No  Comment [facility] [place] [facility]; had an neuro episode - - - -  Number falls in past yr: 1 1 - - -  Injury with Fall? - No - - -  Risk for fall due to : Impaired balance/gait - - - -  Risk for fall due to: Comment with possible fall; Was dx TIA possibly  - - - -  Follow up Education provided - - - -     Cognitive Function: 2 sibling with Dementia; oldest sister and older brother  Born in middle of his 30 sister and 5 brother family   Dr. Delice Lesch is following  MMSE - [place] Exam 07/08/2017  Not completed: (No Data)   Montreal Cognitive Assessment  12/04/2016 12/06/2015 03/30/2015  Visuospatial/ Executive (0/5) 5 5 5   Naming (0/3) 3 3 3   Attention: Read list of digits (0/2) 2 2 2   Attention: Read list of letters (0/1) 1 1 1   Attention: Serial 7 subtraction starting at 100 (0/3) 3 3 3   Language: Repeat phrase (0/2) 2 2 2   Language : Fluency (0/1) 1 1 1   Abstraction (0/2) 2 2 2   Delayed Recall (0/5) 5 5 5   Orientation (0/6) 6 6 6   Total 30 30 30   Adjusted Score (based on education) - 30 30    will defer MMSE until Sept when he sees Dr. Delice Lesch  Screening Tests Health Maintenance  Topic Date Due  . INFLUENZA VACCINE  10/16/2017  . PNA vac Low Risk Adult (2 of 2 - PPSV23) 04/23/2018  . COLONOSCOPY  02/11/2027  . TETANUS/TDAP  04/24/2027  . Hepatitis C Screening  Completed   :       Plan:      PCP Notes   [facility] Low dose CT or check for abdominal aortic aneurysm due to smoking history. Both of these Medicare screens for patients that have smoked over a pack a day for 30 years and quit within the last 15 years   [facility] Tetanus [facility] [place] Flu vaccines every year  ( then next time you see the [place] doctor, you can request your Immunization and we can catch up your records )  Restores old cars   Shingrix is a vaccine for the prevention of  Shingles in Adults 69 and older.  Abnormal Screens  None today  Referrals  none  Patient concerns; No, feels he gets good health care at this time  Nurse Concerns; As noted  Next PCP apt With Dr. Delice Lesch in 11/2017  Will schedule with Dr. [name] as needed  Will schedule AWV for next year     I have personally reviewed and noted the following in the patient's chart:   . Medical and social history . Use of alcohol, tobacco or illicit drugs  . Current medications and supplements . Functional ability and status . Nutritional status . Physical activity . Advanced directives .  List of other physicians . Hospitalizations, surgeries, and ER visits in previous 12 months . Vitals . Screenings to include cognitive, depression, and falls . Referrals and appointments  In addition, I have reviewed and discussed with patient certain preventive protocols, quality metrics, and best practice recommendations. A written personalized care plan for preventive services as well as general preventive health recommendations were provided to patient.     Wynetta Fines, RN   07/08/2017

## 2017-07-08 ENCOUNTER — Ambulatory Visit (INDEPENDENT_AMBULATORY_CARE_PROVIDER_SITE_OTHER): Payer: Medicare Other

## 2017-07-08 VITALS — BP 138/70 | HR 66 | Ht 75.0 in | Wt 174.3 lb

## 2017-07-08 DIAGNOSIS — Z0000 Encounter for general adult medical examination without abnormal findings: Secondary | ICD-10-CM | POA: Diagnosis not present

## 2017-07-08 NOTE — Patient Instructions (Addendum)
David David , Thank you for taking time to come for your Medicare Wellness Visit. I appreciate your ongoing commitment to your health goals. Please review the following plan we discussed and let me know if I can assist you in the future.   [facility] dose [facility] or check for abdominal aortic aneurysm due to smoking history. Both of these Medicare screens for patients that have smoked over a pack a day for 30 years and quit within the last 15 years   [facility] Tetanus at the [place] [facility] every year  ( then next time you see the [place] doctor, you can request your Immunization and we can catch up your records )   Shingrix is a vaccine for the prevention of Shingles in Adults 50 and older.  If you are on Medicare, the shingrix is covered under your Part D plan, so you will take both of the vaccines in the series at your pharmacy. Please check with your benefits regarding applicable copays or out of pocket expenses.  The Shingrix is given in 2 vaccines approx 8 weeks apart. You must receive the 2nd dose prior to 6 months from receipt of the first. Please have the pharmacist print out you Immunization  dates for our office records    These are the goals we discussed: Goals    . Patient Stated     To maintain your health and continue to see your doctors       This is a list of the screening recommended for you and due dates:  Health Maintenance  Topic Date Due  . Flu Shot  10/16/2017  . Pneumonia vaccines (2 of 2 - PPSV23) 04/23/2018  . Colon Cancer Screening  02/11/2027  . Tetanus Vaccine  04/24/2027  .  Hepatitis C: One time screening is recommended by Center for Disease Control  (CDC) for  adults born from [birth year] through 1967.   Completed   Educated regarding prediabetes and numbers;  A1c ranges from 5.8 to 6.5 or fasting Blood sugar > 115 -126; (126 is diabetic)  Your A1c was 6.1   Risk: >45yo; family hx; overweight or obese;  African American; Hispanic; Latino; American [place] American; [place]; history of diabetes when pregnant; or birth to a baby weighing over 9 lbs. Being less physically active than 30 minutes; 3 times a week;   Prevention; Losing a modest 7 to 8 lbs; If over 200 lbs; 10 to 14 lbs;  Choose healthier foods; colorful veggies; fish or lean meats; drinks water Reduce portion size Start exercising; 30 minutes of fast walking x 30 minutes per day/ 60 min for weight loss        Screening for Type 2 Diabetes A screening test for type 2 diabetes (type 2 diabetes mellitus) is a blood test to measure your blood sugar (glucose) level. This test is done to check for early signs of diabetes, before you develop symptoms. Type 2 diabetes is a long-term (chronic) disease that occurs when the pancreas does not make enough of a hormone called insulin. This results in high blood glucose levels, which can cause many complications. You may be screened for type 2 diabetes as part of your regular health care, especially if you have a high risk for diabetes. Screening can help identify type 2 diabetes at its early stage (prediabetes). Identifying and treating prediabetes may delay or prevent development of type 2 diabetes. What are  the risk factors for type 2 diabetes? The following factors may make you more likely to develop type 2 diabetes:  Having a parent or sibling (first-degree relative) who has diabetes.  Being overweight or obese.  Being of American-Indian, [place], Hispanic, Latino, Asian, or African-American descent.  Not getting enough exercise.  Being older than 45.  Having a history of diabetes during pregnancy (gestational diabetes).  Having low levels of good cholesterol (HDL-C) or high levels of blood fats (triglycerides).  Having high blood glucose in a previous blood test.  Having high blood pressure.  Having certain diseases or conditions,  including: ? Acanthosis nigricans. This is a condition that causes dark skin on the neck, armpits, and groin. ? Polycystic ovary syndrome (PCOS). ? Heart disease.  Having delivered a baby who weighed more than 9 lb (4.1 kg).  Who should be screened for type 2 diabetes? Adults  Adults age 18 and older. These adults should be screened at least once every three years.  Adults who are younger than 73, overweight, and have at least one other risk factor. These adults should be screened at least once every three years.  Adults who have normal blood glucose levels and two or more risk factors. These adults may be screened once every year (annually).  Women who have had gestational diabetes in the past. These women should be screened at least once every three years.  Pregnant women who have risk factors. These women should be screened at their first prenatal visit.  Pregnant women with no risk factors. These women should be screened between weeks 24 and 28 of pregnancy. Children and adolescents  Children and adolescents should be screened for type 2 diabetes if they are overweight and have 2 of the following risk factors: ? A family history of type 2 diabetes. ? Being a member of a high risk race or ethnic group. ? Signs of insulin resistance or conditions associated with insulin resistance. ? A mother who had gestational diabetes while pregnant with him or her.  Screening should be done at least once every three years, starting at age 37. Your health care provider or your child's health care provider may recommend having a screening more or less often. What happens during screening? During screening, your health care provider may ask questions about:  Your health and your risk factors, including your activity level and any medical conditions that you have.  The health of your first-degree relatives.  Past pregnancies, if this applies.  Your health care provider will also do a  physical exam, including a blood pressure measurement and blood tests. There are four blood tests that can be used to screen for type 2 diabetes. You may have one or more of the following:  A fasting plasma glucose test (FBG). You will not be allowed to eat for at least eight hours before a blood sample is taken.  A random blood glucose test. This test checks your blood glucose at any time of the day regardless of when you ate.  An oral glucose tolerance test (OGTT). This test measures your blood glucose at two times: ? After you have not eaten (have fasted) overnight. ? Two hours after you drink a glucose-containing beverage. A diagnosis can be made if the level is greater than 200 mg/dL.  An A1c test. This test provides information about blood glucose control over the previous three months.  What do the results mean? Your test results are a measurement of how much glucose is  in your blood. Normal blood glucose levels mean that you do not have diabetes or prediabetes. High blood glucose levels may mean that you have prediabetes or diabetes. Depending on the results, other tests may be needed to confirm the diagnosis. This information is not intended to replace advice given to you by your health care provider. Make sure you discuss any questions you have with your health care provider. Document Released: 12/29/2008 Document Revised: 08/10/2015 Document Reviewed: 12/30/2014 Elsevier Interactive Patient Education  2017 [place] Prevention in the Home Falls can cause injuries. They can happen to people of all ages. There are many things you can do to make your home safe and to help prevent falls. What can I do on the outside of my home?  Regularly fix the edges of walkways and driveways and fix any cracks.  Remove anything that might make you trip as you walk through a door, such as a raised step or threshold.  Trim any bushes or trees on the path to your home.  Use bright  outdoor lighting.  Clear any walking paths of anything that might make someone trip, such as rocks or tools.  Regularly check to see if handrails are loose or broken. Make sure that both sides of any steps have handrails.  Any raised decks and porches should have guardrails on the edges.  Have any leaves, snow, or ice cleared regularly.  Use sand or salt on walking paths during winter.  Clean up any spills in your garage right away. This includes oil or grease spills. What can I do in the bathroom?  Use night lights.  Install grab bars by the toilet and in the tub and shower. Do not use towel bars as grab bars.  Use non-skid mats or decals in the tub or shower.  If you need to sit down in the shower, use a plastic, non-slip stool.  Keep the floor dry. Clean up any water that spills on the floor as soon as it happens.  Remove soap buildup in the tub or shower regularly.  Attach bath mats securely with double-sided non-slip rug tape.  Do not have throw rugs and other things on the floor that can make you trip. What can I do in the bedroom?  Use night lights.  Make sure that you have a light by your bed that is easy to reach.  Do not use any sheets or blankets that are too big for your bed. They should not hang down onto the floor.  Have a firm chair that has side arms. You can use this for support while you get dressed.  Do not have throw rugs and other things on the floor that can make you trip. What can I do in the kitchen?  Clean up any spills right away.  Avoid walking on wet floors.  Keep items that you use a lot in easy-to-reach places.  If you need to reach something above you, use a strong step stool that has a grab bar.  Keep electrical cords out of the way.  Do not use floor polish or wax that makes floors slippery. If you must use wax, use non-skid floor wax.  Do not have throw rugs and other things on the floor that can make you trip. What can I do  with my stairs?  Do not leave any items on the stairs.  Make sure that there are handrails on both sides of the stairs and use them. Fix  handrails that are broken or loose. Make sure that handrails are as long as the stairways.  Check any carpeting to make sure that it is firmly attached to the stairs. Fix any carpet that is loose or worn.  Avoid having throw rugs at the top or bottom of the stairs. If you do have throw rugs, attach them to the floor with carpet tape.  Make sure that you have a light switch at the top of the stairs and the bottom of the stairs. If you do not have them, ask someone to add them for you. What else can I do to help prevent falls?  Wear shoes that: ? Do not have high heels. ? Have rubber bottoms. ? Are comfortable and fit you well. ? Are closed at the toe. Do not wear sandals.  If you use a stepladder: ? Make sure that it is fully opened. Do not climb a closed stepladder. ? Make sure that both sides of the stepladder are locked into place. ? Ask someone to hold it for you, if possible.  Clearly mark and make sure that you can see: ? Any grab bars or handrails. ? First and last steps. ? Where the edge of each step is.  Use tools that help you move around (mobility aids) if they are needed. These include: ? Canes. ? Walkers. ? Scooters. ? Crutches.  Turn on the lights when you go into a dark area. Replace any light bulbs as soon as they burn out.  Set up your furniture so you have a clear path. Avoid moving your furniture around.  If any of your floors are uneven, fix them.  If there are any pets around you, be aware of where they are.  Review your medicines with your doctor. Some medicines can make you feel dizzy. This can increase your chance of falling. Ask your doctor what other things that you can do to help prevent falls. This information is not intended to replace advice given to you by your health care provider. Make sure you discuss any  questions you have with your health care provider. Document Released: 12/29/2008 Document Revised: 08/10/2015 Document Reviewed: 04/08/2014 Elsevier Interactive Patient Education  2018 [place] Maintenance, Male A healthy lifestyle and preventive care is important for your health and wellness. Ask your health care provider about what schedule of regular examinations is right for you. What should I know about weight and diet? Eat a Healthy Diet  Eat plenty of vegetables, fruits, whole grains, low-fat dairy products, and lean protein.  Do not eat a lot of foods high in solid fats, added sugars, or salt.  Maintain a Healthy Weight Regular exercise can help you achieve or maintain a healthy weight. You should:  Do at least 150 minutes of exercise each week. The exercise should increase your heart rate and make you sweat (moderate-intensity exercise).  Do strength-training exercises at least twice a week.  Watch Your Levels of Cholesterol and Blood Lipids  Have your blood tested for lipids and cholesterol every 5 years starting at 69 years of age. If you are at high risk for heart disease, you should start having your blood tested when you are 69 years old. You may need to have your cholesterol levels checked more often if: ? Your lipid or cholesterol levels are high. ? You are older than 69 years of age. ? You are at high risk for heart disease.  What should I know about cancer screening?  Many types of cancers can be detected early and may often be prevented. Lung Cancer  You should be screened every year for lung cancer if: ? You are a current smoker who has smoked for at least 30 years. ? You are a former smoker who has quit within the past 15 years.  Talk to your health care provider about your screening options, when you should start screening, and how often you should be screened.  Colorectal Cancer  Routine colorectal cancer screening usually begins at 69  years of age and should be repeated every 5-10 years until you are 69 years old. You may need to be screened more often if early forms of precancerous polyps or small growths are found. Your health care provider may recommend screening at an earlier age if you have risk factors for colon cancer.  Your health care provider may recommend using home test kits to check for hidden blood in the stool.  A small camera at the end of a tube can be used to examine your colon (sigmoidoscopy or colonoscopy). This checks for the earliest forms of colorectal cancer.  Prostate and Testicular Cancer  Depending on your age and overall health, your health care provider may do certain tests to screen for prostate and testicular cancer.  Talk to your health care provider about any symptoms or concerns you have about testicular or prostate cancer.  Skin Cancer  Check your skin from head to toe regularly.  Tell your health care provider about any new moles or changes in moles, especially if: ? There is a change in a mole's size, shape, or color. ? You have a mole that is larger than a pencil eraser.  Always use sunscreen. Apply sunscreen liberally and repeat throughout the day.  Protect yourself by wearing long sleeves, pants, a wide-brimmed hat, and sunglasses when outside.  What should I know about heart disease, diabetes, and high blood pressure?  If you are 67-45 years of age, have your blood pressure checked every 3-5 years. If you are 53 years of age or older, have your blood pressure checked every year. You should have your blood pressure measured twice-once when you are at a hospital or clinic, and once when you are not at a hospital or clinic. Record the average of the two measurements. To check your blood pressure when you are not at a hospital or clinic, you can use: ? An automated blood pressure machine at a pharmacy. ? A home blood pressure monitor.  Talk to your health care provider about your  target blood pressure.  If you are between 29-76 years old, ask your health care provider if you should take aspirin to prevent heart disease.  Have regular diabetes screenings by checking your fasting blood sugar level. ? If you are at a normal weight and have a low risk for diabetes, have this test once every three years after the age of 55. ? If you are overweight and have a high risk for diabetes, consider being tested at a younger age or more often.  A one-time screening for abdominal aortic aneurysm (AAA) by ultrasound is recommended for men aged 61-75 years who are current or former smokers. What should I know about preventing infection? Hepatitis B If you have a higher risk for hepatitis B, you should be screened for this virus. Talk with your health care provider to find out if you are at risk for hepatitis B infection. Hepatitis C Blood testing is recommended for:  Everyone born from [birth year] through 1967.  Anyone with known risk factors for hepatitis C.  Sexually Transmitted Diseases (STDs)  You should be screened each year for STDs including gonorrhea and chlamydia if: ? You are sexually active and are younger than 69 years of age. ? You are older than 69 years of age and your health care provider tells you that you are at risk for this type of infection. ? Your sexual activity has changed since you were last screened and you are at an increased risk for chlamydia or gonorrhea. Ask your health care provider if you are at risk.  Talk with your health care provider about whether you are at high risk of being infected with HIV. Your health care provider may recommend a prescription medicine to help prevent HIV infection.  What else can I do?  Schedule regular health, dental, and eye exams.  Stay current with your vaccines (immunizations).  Do not use any tobacco products, such as cigarettes, chewing tobacco, and e-cigarettes. If you need help quitting, ask your health care  provider.  Limit alcohol intake to no more than 2 drinks per day. One drink equals 12 ounces of beer, 5 ounces of wine, or 1 ounces of hard liquor.  Do not use street drugs.  Do not share needles.  Ask your health care provider for help if you need support or information about quitting drugs.  Tell your health care provider if you often feel depressed.  Tell your health care provider if you have ever been abused or do not feel safe at home. This information is not intended to replace advice given to you by your health care provider. Make sure you discuss any questions you have with your health care provider. Document Released: 08/31/2007 Document Revised: 11/01/2015 Document Reviewed: 12/06/2014 Elsevier Interactive Patient Education  [employer].

## 2017-07-12 ENCOUNTER — Other Ambulatory Visit: Payer: Self-pay | Admitting: Family Medicine

## 2017-07-12 DIAGNOSIS — R0982 Postnasal drip: Secondary | ICD-10-CM

## 2017-07-12 DIAGNOSIS — H6983 Other specified disorders of Eustachian tube, bilateral: Secondary | ICD-10-CM

## 2017-07-14 NOTE — Progress Notes (Signed)
I have reviewed documentation from this visit and I agree with recommendations given.  [name]. Jordan, MD  [facility]. [place] office.   

## 2017-09-15 ENCOUNTER — Ambulatory Visit: Payer: Self-pay | Admitting: *Deleted

## 2017-09-15 NOTE — Telephone Encounter (Signed)
Pt reports mild lightheadedness x 4-5 weeks. Does not occur daily; when does occur, "only once a day" various times. Seen by Dr. Volanda Napoleon 06/20/17, nasal drainage and "Sore nose ,dysfunction of both eustachian tubes." Rx Flonase and zyrtec, "Not working."  States is staying hydrated. States able to ambulate normally during episodes of lightheadedness but feels unsteady.  Denies any visual changes, CP, SOB, fever. Appt made for tomorrow with Dr. [name]. Care advise given; instructed to call back if symptoms worsen.  Reason for Disposition . [1] MILD dizziness (e.g., walking normally) AND [2] has NOT been evaluated by physician for this  (Exception: dizziness caused by heat exposure, sudden standing, or poor fluid intake)  Answer Assessment - Initial Assessment Questions 1. DESCRIPTION: "Describe your dizziness."     Lightheaded 2. LIGHTHEADED: "Do you feel lightheaded?" (e.g., somewhat faint, woozy, weak upon standing)     Yes, not presently 3. VERTIGO: "Do you feel like either you or the room is spinning or tilting?" (i.e. vertigo)     no 4. SEVERITY: "How bad is it?"  "Do you feel like you are going to faint?" "Can you stand and walk?"   - MILD - walking normally   - MODERATE - interferes with normal activities (e.g., work, school)    - SEVERE - unable to stand, requires support to walk, feels like passing out now.      mild 5. ONSET:  "When did the dizziness begin?"    4-5 weeks ago 6. AGGRAVATING FACTORS: "Does anything make it worse?" (e.g., standing, change in head position)     No 7. HEART RATE: "Can you tell me your heart rate?" "How many beats in 15 seconds?"  (Note: not all patients can do this)       no 8. CAUSE: "What do you think is causing the dizziness?"     Unsure, "maybe ear problem." 9. RECURRENT SYMPTOM: "Have you had dizziness before?" If so, ask: "When was the last time?" "What happened that time?"      Yes, unsure why 10. OTHER SYMPTOMS: "Do you have any other symptoms?"  (e.g., fever, chest pain, vomiting, diarrhea, bleeding)    Post nasal drip, both sides of nose sore  Protocols used: DIZZINESS Heidi Dach

## 2017-09-16 ENCOUNTER — Ambulatory Visit (INDEPENDENT_AMBULATORY_CARE_PROVIDER_SITE_OTHER): Payer: Medicare Other | Admitting: Family Medicine

## 2017-09-16 ENCOUNTER — Encounter: Payer: Self-pay | Admitting: Family Medicine

## 2017-09-16 VITALS — BP 140/80 | HR 60 | Temp 98.2°F | Resp 12 | Ht 75.0 in | Wt 177.5 lb

## 2017-09-16 DIAGNOSIS — F3341 Major depressive disorder, recurrent, in partial remission: Secondary | ICD-10-CM | POA: Diagnosis not present

## 2017-09-16 DIAGNOSIS — G459 Transient cerebral ischemic attack, unspecified: Secondary | ICD-10-CM

## 2017-09-16 DIAGNOSIS — R5383 Other fatigue: Secondary | ICD-10-CM

## 2017-09-16 DIAGNOSIS — I10 Essential (primary) hypertension: Secondary | ICD-10-CM | POA: Diagnosis not present

## 2017-09-16 DIAGNOSIS — E039 Hypothyroidism, unspecified: Secondary | ICD-10-CM

## 2017-09-16 LAB — BASIC METABOLIC PANEL
BUN: 14 mg/dL (ref 6–23)
CHLORIDE: 104 meq/L (ref 96–112)
CO2: 27 mEq/L (ref 19–32)
CREATININE: 1.08 mg/dL (ref 0.40–1.50)
Calcium: 10 mg/dL (ref 8.4–10.5)
GFR: 72.14 mL/min (ref >60.00)
Glucose, Bld: 115 mg/dL — ABNORMAL HIGH (ref 70–99)
POTASSIUM: 4.4 meq/L (ref 3.5–5.1)
SODIUM: 143 meq/L (ref 135–145)

## 2017-09-16 LAB — CBC
HCT: 46.1 % (ref 39.0–52.0)
Hemoglobin: 15.3 g/dL (ref 13.0–17.0)
MCHC: 33.3 g/dL (ref 30.0–36.0)
MCV: 91 fl (ref 78.0–100.0)
Platelets: 122 10*3/uL — ABNORMAL LOW (ref 150.0–400.0)
RBC: 5.07 Mil/uL (ref 4.22–5.81)
RDW: 13.8 % (ref 11.5–15.5)
WBC: 8.8 10*3/uL (ref 4.0–10.5)

## 2017-09-16 LAB — TSH: TSH: 2.95 u[IU]/mL (ref 0.35–4.50)

## 2017-09-16 NOTE — Patient Instructions (Signed)
A few things to remember from today's visit:   Hypothyroidism, unspecified type  Fatigue, unspecified type - Plan: CBC, Basic metabolic panel, TSH  It is a common symptom associated with multiple factors: psychologic,medications, systemic illness, sleep disorders,infections, and unknown causes. Some work-up can be done to evaluate for common causes as thyroid disease,anemia,diabetes, or abnormalities in calcium,potassium,or sodium. Regular physical activity as tolerated and a healthy diet is usually might help and usually recommended for chronic fatigue.  Please be sure medication list is accurate. If a new problem present, please set up appointment sooner than planned today.

## 2017-09-16 NOTE — Progress Notes (Signed)
ACUTE VISIT   HPI:  Chief Complaint  Patient presents with  . Dizziness    started 4 or 5 weeks ago, not daily  . neck stiffness    David David is a 67 y.o. male, who is here today with his daughter complaining of dizziness. He cannot described symptom, states that he has episodes of severe tiredness and imbalance.  +Fatigue,"lack of energy."  "Lethargic" feeling.  He is reporting this problem as new.  According to his daughter, he has had dizziness in the past.   He does not have dizziness when he is in bed or when getting up. No tinnitus, hearing changes, or ear ache.  But he feels like he has a "bug" in his right ear, "popping sensation." Continues moving. He has not identified exacerbating or alleviating changes. It happens "random/sporadic."   According to his daughter, he becomes pale when he has episodes. He has about 8-10 episodes per day. He wonders if this problem is related to allergies.  No associated headache, chest pain, palpitations, or diaphoresis.   He has not try OTC medications.   Hypothyroidism: Currently he is on levothyroxine 75 mcg daily. Last TSH done a year ago, 2.07.   Prediabetes: A1c was 6.1 in 03/2017. Denies abdominal pain, nausea,vomiting, polydipsia,polyuria, or polyphagia.   He is also complaining of cervical pain, limitation of range of motion. He has history of cervical spinal stenosis, reported as mild.  Currently he is following with neurosurgeon.   Hypertension:  He has not check BP at home. Currently he is on Amlodipine 10 mg daily.  He has tolerated medication well.  He is also concerned about "crusty" areas in the nose.  No nosebleeding, nasal congestion, or rhinorrhea. Some postnasal drainage. Flonase nasal spray is not helping.   His daughter is also concerned about him taking Aspirin. She has heard that Aspirin is no longer recommended and it can cause bleeding. He has history of TIA,  hospitalized in 03/2017. Also history of PAD. He follows with cardiologist.  He has not noted side effects from Aspirin.  Depression and anxiety: He is sleeping well, currently he is on mirtazapine 50 mg daily at bedtime. He is also on Lexapro 10 mg daily. He follows with psychiatrist through the [place].  He follows with neurologist due to memory problems.   Review of Systems  Constitutional: Positive for activity change and fatigue. Negative for appetite change and fever.  HENT: Positive for postnasal drip. Negative for congestion, ear pain, hearing loss, nosebleeds, sinus pressure, sore throat, tinnitus and trouble swallowing.   Eyes: Negative for redness and visual disturbance.  Respiratory: Negative for cough, shortness of breath and wheezing.   Cardiovascular: Negative for chest pain, palpitations and leg swelling.  Gastrointestinal: Negative for abdominal pain, nausea and vomiting.  Endocrine: Negative for cold intolerance and heat intolerance.  Genitourinary: Negative for decreased urine volume, dysuria and hematuria.  Musculoskeletal: Positive for gait problem, myalgias and neck pain.  Skin: Negative for rash and wound.  Neurological: Negative for syncope, speech difficulty, weakness and headaches.  Psychiatric/Behavioral: Negative for confusion and hallucinations. The patient is nervous/anxious.       Current Outpatient Medications on File Prior to Visit  Medication Sig Dispense Refill  . albuterol (PROVENTIL HFA;VENTOLIN HFA) 108 (90 Base) MCG/ACT inhaler Inhale 2 puffs into the lungs as needed for wheezing or shortness of breath.    . alfuzosin (UROXATRAL) 10 MG 24 hr tablet Take 10 mg by mouth at  bedtime.  11  . amLODipine (NORVASC) 10 MG tablet Take 5 mg by mouth daily.      Marland Kitchen ammonium lactate (LAC-HYDRIN) 12 % lotion Apply 1 application topically 2 (two) times daily as needed for dry skin.    Marland Kitchen aspirin EC 81 MG tablet Take 1 tablet (81 mg total) by mouth daily. 90 tablet  3  . Calcium Carb-Cholecalciferol (CALCIUM-VITAMIN D) 500-200 MG-UNIT tablet Take 1 tablet by mouth daily.    . cetirizine (ZYRTEC) 10 MG tablet Take 1 tablet (10 mg total) by mouth daily. 30 tablet 0  . cholecalciferol (VITAMIN D) 1000 UNITS tablet Take 1,000 Units by mouth daily.    . clopidogrel (PLAVIX) 75 MG tablet Take 1 tablet (75 mg total) by mouth daily. 30 tablet 3  . cyclobenzaprine (FLEXERIL) 10 MG tablet Take 1 tablet (10 mg total) by mouth at bedtime. 30 tablet   . escitalopram (LEXAPRO) 10 MG tablet Take 30 mg by mouth every morning.     . fluticasone (FLONASE) 50 MCG/ACT nasal spray PLACE 1 SPRAY INTO BOTH NOSTRILS DAILY. 16 g 3  . gabapentin (NEURONTIN) 300 MG capsule Take 600 mg by mouth 2 (two) times daily.    . hydrOXYzine (ATARAX) 50 MG tablet Take 50 mg by mouth 2 (two) times daily.     Marland Kitchen levothyroxine (SYNTHROID, LEVOTHROID) 75 MCG tablet Take 1 tablet (75 mcg total) by mouth daily before breakfast.    . mirtazapine (REMERON) 15 MG tablet Take 1 tablet (15 mg total) by mouth at bedtime. 30 tablet 2  . multivitamin (THERAGRAN) per tablet Take 1 tablet by mouth daily.      . Omega-3 Fatty Acids (FISH OIL) 1000 MG CAPS Take 1,000 mg by mouth 2 (two) times daily.     Marland Kitchen omeprazole (PRILOSEC) 40 MG capsule Take 40 mg by mouth daily.    . pravastatin (PRAVACHOL) 40 MG tablet Take 40 mg by mouth at bedtime.    . vitamin E 600 UNIT capsule Take 600 Units by mouth daily.      Marland Kitchen zolpidem (AMBIEN) 10 MG tablet Take 5 mg by mouth at bedtime.     No current facility-administered medications on file prior to visit.      Past Medical History:  Diagnosis Date  . Alcoholism in family   . Anxiety   . Arthritis   . Depressive disorder, not elsewhere classified   . Diverticulosis of colon (without mention of hemorrhage)   . Dysfunction of eustachian tube   . Exposure to STD   . Family history of diabetes mellitus   . Family history of psychiatric condition    family Hx of suicide    . GERD (gastroesophageal reflux disease)   . Impotence of organic origin    erectile dysfunction  . Lipoprotein deficiencies   . Other diseases of lung, not elsewhere classified    pulmonary nodule  . Peripheral vascular disease, unspecified ([place])    lower extremity peripheral artery disease  . Personal history of colonic polyps   . Routine general medical examination at a health care facility   . Seizures ([place])   . Sprain of neck    Hx  . Thyroid disease   . Tobacco use disorder   . Unspecified hypertensive kidney disease with chronic kidney disease stage I through stage IV, or unspecified(403.90)    cholesterol embolization syndrome   Allergies  Allergen Reactions  . Tape Other (See Comments)    "Plastic" tape TEARS THE SKIN;  please use paper tape or Coban wrap!!  . Neomycin-Bacitracin Zn-Polymyx Rash  . Niacin Rash  . Paroxetine Other (See Comments)    Makes the patient feel SEVERELY SEDATED/LETHARGIC/CANNOT SPEAK WORDS    Social History   Socioeconomic History  . Marital status: Single    Spouse name: Not on file  . Number of children: 1  . Years of education: Not on file  . Highest education level: Not on file  Occupational History  . Occupation: retired  Scientific laboratory technician  . Financial resource strain: Not on file  . Food insecurity:    Worry: Not on file    Inability: Not on file  . Transportation needs:    Medical: Not on file    Non-medical: Not on file  Tobacco Use  . Smoking status: Former Smoker    Packs/day: 1.00    Years: 50.00    Pack years: 50.00    Types: Cigarettes    Start date: 07/22/2016  . Smokeless tobacco: Never Used  . Tobacco comment: has some off and on smoking - [facility]  Substance and Sexual Activity  . Alcohol use: Yes    Alcohol/week: 0.0 oz    Comment: social  . Drug use: Yes    Types: Marijuana  . Sexual activity: Not on file  Lifestyle  . Physical activity:    Days per week: Not on file    Minutes per session:  Not on file  . Stress: Not on file  Relationships  . Social connections:    Talks on phone: Not on file    Gets together: Not on file    Attends religious service: Not on file    Active member of club or organization: Not on file    Attends meetings of clubs or organizations: Not on file    Relationship status: Not on file  Other Topics Concern  . Not on file  Social History Narrative   No diet, not restricting cholesterol.    Vitals:   09/16/17 1042  BP: 140/80  Pulse: 60  Resp: 12  Temp: 98.2 F (36.8 C)  SpO2: 97%   Body mass index is 22.19 kg/m.   Physical Exam  Nursing note and vitals reviewed. Constitutional: He is oriented to person, place, and time. He appears well-developed. No distress.  HENT:  Head: Normocephalic and atraumatic.  Mouth/Throat: Oropharynx is clear and moist and mucous membranes are normal.  Eyes: Pupils are equal, round, and reactive to light. Conjunctivae and EOM are normal.  Neck: No tracheal deviation present. Thyromegaly (chronic) present. No thyroid mass present.  Cardiovascular: Normal rate and regular rhythm.  No murmur heard. Pulses:      Dorsalis pedis pulses are 2+ on the right side, and 2+ on the left side.  Respiratory: Effort normal and breath sounds normal. No respiratory distress.  GI: Soft. He exhibits no mass. There is no hepatomegaly. There is no tenderness.  Musculoskeletal: He exhibits no edema.  Lymphadenopathy:    He has no cervical adenopathy.  Neurological: He is alert and oriented to person, place, and time. He has normal strength. No cranial nerve deficit.  Pronator drift negative. Stable gait with no assistance.  Skin: Skin is warm. No rash noted. No erythema.  Psychiatric: His mood appears anxious.  Well groomed, good eye contact.     ASSESSMENT AND PLAN:   Mr. Izak was seen today for dizziness and neck stiffness.  Diagnoses and all orders for this visit:  Lab Results  Component Value Date   TSH  2.95 09/16/2017   Lab Results  Component Value Date   WBC 8.8 09/16/2017   HGB 15.3 09/16/2017   HCT 46.1 09/16/2017   MCV 91.0 09/16/2017   PLT 122.0 (L) 09/16/2017   Lab Results  Component Value Date   CREATININE 1.08 09/16/2017   BUN 14 09/16/2017   NA 143 09/16/2017   K 4.4 09/16/2017   CL 104 09/16/2017   CO2 27 09/16/2017    Fatigue, unspecified type  It seems like what he is describing as dizziness is fatigue, lack of energy. We discussed possible etiologies: Systemic illness, immunologic,endocrinology,sleep disorder, psychiatric/psychologic, infectious,medications side effects, and idiopathic. Fall precaution discussed. Examination today does not suggest a serious process.  Further recommendations will be given according to lab results.  -     CBC -     Basic metabolic panel -     TSH  Hypothyroidism, unspecified type  No changes in current management, will follow labs done today and will give further recommendations accordingly. Continue following with endocrinologist through the [place].  -     TSH  TIA (transient ischemic attack)  Given his history of TIA and PAD, I recommend continuing Aspirin 81 mg daily.  We discussed some side effects, so far he is tolerating well.  Recurrent major depressive disorder, in partial remission ([place])  This problem could be contributing to some of his symptoms. He is not sure if he still needs the medication. He will continue following with psychiatrist through the [place].  Essential hypertension  Otherwise adequately controlled. Recommend monitoring BP at home. Continue low-salt diet. No changes in current management.  -     Basic metabolic panel      Return in about 3 months (around 12/17/2017).       David Narciso G. [name], MD  [facility]. [place] office.

## 2017-09-21 ENCOUNTER — Encounter: Payer: Self-pay | Admitting: Family Medicine

## 2017-12-05 ENCOUNTER — Other Ambulatory Visit: Payer: Self-pay

## 2017-12-05 ENCOUNTER — Ambulatory Visit: Payer: Medicare Other | Admitting: Neurology

## 2017-12-05 ENCOUNTER — Encounter: Payer: Self-pay | Admitting: Neurology

## 2017-12-05 VITALS — BP 126/78 | HR 78 | Ht 75.0 in | Wt 184.0 lb

## 2017-12-05 DIAGNOSIS — R55 Syncope and collapse: Secondary | ICD-10-CM | POA: Diagnosis not present

## 2017-12-05 DIAGNOSIS — R413 Other amnesia: Secondary | ICD-10-CM

## 2017-12-05 NOTE — Progress Notes (Signed)
NEUROLOGY FOLLOW UP OFFICE NOTE  David David [id number]  DOB: 01-27-1951  HISTORY OF PRESENT ILLNESS: I had the pleasure of seeing David David in follow-up in the neurology clinic on 12/05/2017.  The patient was last seen 7 months ago for memory changes and TIA vs presyncope. He was last seen after he had a transient episode of dysequilibrium last 04/15/17 when he turned to walk and suddenly his balance started "getting screwy" and everything seemed to be going to the right. His hearing was not good and eyes were not focusing right (tunnel vision), he felt like he was going to pass out and had to hold on to a pole for 15 minutes. He felt like he was inebriated. There was no headache, focal numbness/tingling/weakness, he did not fall. MRI brain without contrast did not show any acute changes. There was mild chronic microvascular disease. CTA head and neck did not show any significant stenosis. Echocardiogram unremarkable, left atrium normal, with note that a PFO cannot be excluded. He was diagnosed with likely TIA versus presyncope, small vessel disease versus cardiac cause. He was continued on aspirin and Plavix. Since his last visit, he reports an episode 4 weeks ago when he was getting ready for bed and took an Ambien, went to his daughter on the deck. He recalls starting to get up, then next thing he knew he was being helped into the house. He does not remember what happened, his daughter did not mention completely passing out, but he fell back and hit his head on the table. He has not taken Ambien since then. He occasionally checks his BP, usually SBP in the 130s. Without Ambien, sleep is fair. His memory is fair, he continues to drive without getting lost. He does not forget medications often. His daughter is in charge of finances. He denies any headaches, dizziness, vision changes, focal numbness/tingling/weakness, no other falls.   HPI 03/30/2015: This is a pleasant 67 yo RH man with a history  of peripheral vascular disease, hypertension, hyperlipidemia, hypothyroidism, PTSD, who presented for evaluation of worsening memory loss. He has a strong family history of dementia in 3 siblings, who were diagnosed at age 21 or so. He and his daughter started noticing changes a little over a year ago, he would start a conversation and forget what the conversation was about. He would start to say something and forget what he was going to say, getting halfway through a sentence then the rest would not come out. He feels like his head and mouth are not working together. His daughter has noticed this as well, he gets blank when trying to process, with long pauses in conversations that have become more frequent. He denies getting lost driving, but has gotten confused a couple of times. One time he could not find his cousin's house because he forgot what road she lived on. He was eventually able to remember it after speaking to his daughter. He lives with his daughter and denies any missed medications or bill payments (he reports there are no bills to pay). His daughter reports he is pretty well-organized, he denies misplacing things frequently. She reports he gets a little more frustrated easily, but no paranoia any different from his history of PTSD.   He has blurred vision from a right cataract, occasional neck and back pain, on and off constipation. He has numbness in his feet, no pain.  No significant head injuries. He reports having a seizure in 2009 when he was on a  significant amount of Wellbutrin for PTSD. He has PTSD as a [place] veteran. He drinks a glass of wine or beer 1-2 times a week. His older sister was diagnosed with dementia at age 2 or 20 and passed away 2 years ago. His brother now 53 has dementia. His sister is 35 and has dementia. He "does not want to get the way they are if he can get help now."   Laboratory Data: He reports having TSH done at the [place] in October with normal TSH. I  personally reviewed MRI brain without contrast which did not show any acute changes, there was mild diffuse atrophy. His B12 was normal.   PAST MEDICAL HISTORY: Past Medical History:  Diagnosis Date  . Alcoholism in family   . Anxiety   . Arthritis   . Depressive disorder, not elsewhere classified   . Diverticulosis of colon (without mention of hemorrhage)   . Dysfunction of eustachian tube   . Exposure to STD   . Family history of diabetes mellitus   . Family history of psychiatric condition    family Hx of suicide   . GERD (gastroesophageal reflux disease)   . Impotence of organic origin    erectile dysfunction  . Lipoprotein deficiencies   . Other diseases of lung, not elsewhere classified    pulmonary nodule  . Peripheral vascular disease, unspecified ([place])    lower extremity peripheral artery disease  . Personal history of colonic polyps   . Routine general medical examination at a health care facility   . Seizures ([place])   . Sprain of neck    Hx  . Thyroid disease   . Tobacco use disorder   . Unspecified hypertensive kidney disease with chronic kidney disease stage I through stage IV, or unspecified(403.90)    cholesterol embolization syndrome    MEDICATIONS: Current Outpatient Medications on File Prior to Visit  Medication Sig Dispense Refill  . albuterol (PROVENTIL HFA;VENTOLIN HFA) 108 (90 Base) MCG/ACT inhaler Inhale 2 puffs into the lungs as needed for wheezing or shortness of breath.    . alfuzosin (UROXATRAL) 10 MG 24 hr tablet Take 10 mg by mouth at bedtime.  11  . amLODipine (NORVASC) 10 MG tablet Take 5 mg by mouth daily.      Marland Kitchen ammonium lactate (LAC-HYDRIN) 12 % lotion Apply 1 application topically 2 (two) times daily as needed for dry skin.    Marland Kitchen aspirin EC 81 MG tablet Take 1 tablet (81 mg total) by mouth daily. 90 tablet 3  . Calcium Carb-Cholecalciferol (CALCIUM-VITAMIN D) 500-200 MG-UNIT tablet Take 1 tablet by mouth daily.    . cetirizine (ZYRTEC) 10 MG  tablet Take 1 tablet (10 mg total) by mouth daily. 30 tablet 0  . cholecalciferol (VITAMIN D) 1000 UNITS tablet Take 1,000 Units by mouth daily.    . clopidogrel (PLAVIX) 75 MG tablet Take 1 tablet (75 mg total) by mouth daily. 30 tablet 3  . cyclobenzaprine (FLEXERIL) 10 MG tablet Take 1 tablet (10 mg total) by mouth at bedtime. 30 tablet   . escitalopram (LEXAPRO) 10 MG tablet Take 30 mg by mouth every morning.     . fluticasone (FLONASE) 50 MCG/ACT nasal spray PLACE 1 SPRAY INTO BOTH NOSTRILS DAILY. 16 g 3  . gabapentin (NEURONTIN) 300 MG capsule Take 600 mg by mouth 2 (two) times daily.    . hydrOXYzine (ATARAX) 50 MG tablet Take 50 mg by mouth 2 (two) times daily.     Marland Kitchen levothyroxine (  SYNTHROID, LEVOTHROID) 75 MCG tablet Take 1 tablet (75 mcg total) by mouth daily before breakfast.    . mirtazapine (REMERON) 15 MG tablet Take 1 tablet (15 mg total) by mouth at bedtime. 30 tablet 2  . multivitamin (THERAGRAN) per tablet Take 1 tablet by mouth daily.      . Omega-3 Fatty Acids (FISH OIL) 1000 MG CAPS Take 1,000 mg by mouth 2 (two) times daily.     Marland Kitchen omeprazole (PRILOSEC) 40 MG capsule Take 40 mg by mouth daily.    . pravastatin (PRAVACHOL) 40 MG tablet Take 40 mg by mouth at bedtime.    . vitamin E 600 UNIT capsule Take 600 Units by mouth daily.      Marland Kitchen zolpidem (AMBIEN) 10 MG tablet Take 5 mg by mouth at bedtime.     No current facility-administered medications on file prior to visit.     ALLERGIES: Allergies  Allergen Reactions  . Tape Other (See Comments)    "Plastic" tape TEARS THE SKIN; please use paper tape or Coban wrap!!  . Neomycin-Bacitracin Zn-Polymyx Rash  . Niacin Rash  . Paroxetine Other (See Comments)    Makes the patient feel SEVERELY SEDATED/LETHARGIC/CANNOT SPEAK WORDS    FAMILY HISTORY: Family History  Problem Relation Age of Onset  . CAD Father   . Congestive Heart Failure Mother   . Lung cancer Brother   . Alzheimer's disease Brother   . Diabetes Brother     . Alzheimer's disease Sister     SOCIAL HISTORY: Social History   Socioeconomic History  . Marital status: Single    Spouse name: Not on file  . Number of children: 1  . Years of education: Not on file  . Highest education level: Not on file  Occupational History  . Occupation: retired  Scientific laboratory technician  . Financial resource strain: Not on file  . Food insecurity:    Worry: Not on file    Inability: Not on file  . Transportation needs:    Medical: Not on file    Non-medical: Not on file  Tobacco Use  . Smoking status: Former Smoker    Packs/day: 1.00    Years: 50.00    Pack years: 50.00    Types: Cigarettes    Start date: 07/22/2016  . Smokeless tobacco: Never Used  . Tobacco comment: has some off and on smoking - [facility]  Substance and Sexual Activity  . Alcohol use: Yes    Alcohol/week: 0.0 standard drinks    Comment: social  . Drug use: Yes    Types: Marijuana  . Sexual activity: Not on file  Lifestyle  . Physical activity:    Days per week: Not on file    Minutes per session: Not on file  . Stress: Not on file  Relationships  . Social connections:    Talks on phone: Not on file    Gets together: Not on file    Attends religious service: Not on file    Active member of club or organization: Not on file    Attends meetings of clubs or organizations: Not on file    Relationship status: Not on file  . Intimate partner violence:    Fear of current or ex partner: Not on file    Emotionally abused: Not on file    Physically abused: Not on file    Forced sexual activity: Not on file  Other Topics Concern  . Not on file  Social History Narrative   No diet, not restricting cholesterol.   REVIEW OF SYSTEMS: Constitutional: No fevers, chills, or sweats, no generalized fatigue, change in appetite Eyes: No visual changes, double vision, eye pain Ear, nose and throat: No hearing loss, ear pain, nasal congestion, sore throat Cardiovascular: No chest pain,  palpitations Respiratory:  No shortness of breath at rest or with exertion, wheezes GastrointestinaI: No nausea, vomiting, diarrhea, abdominal pain, fecal incontinence Genitourinary:  No dysuria, urinary retention or frequency Musculoskeletal:  + neck pain, back pain Integumentary: No rash, pruritus, skin lesions Neurological: as above Psychiatric: No depression, insomnia, anxiety Endocrine: No palpitations, fatigue, diaphoresis, mood swings, change in appetite, change in weight, increased thirst Hematologic/Lymphatic:  No anemia, purpura, petechiae. Allergic/Immunologic: no itchy/runny eyes, nasal congestion, recent allergic reactions, rashes  PHYSICAL EXAM: Vitals:   12/05/17 0818  BP: 126/78  Pulse: 78  SpO2: 96%   Orthostatic VS for the past 24 hrs (Last 3 readings):  BP- Lying Pulse- Lying BP- Sitting Pulse- Sitting BP- Standing at 0 minutes Pulse- Standing at 0 minutes  12/05/17 0915 118/76 67 122/80 72 140/84 74    General: No acute distress Head:  Normocephalic/atraumatic Skin/Extremities: No rash, no edema Neurological Exam: alert and oriented to person, place, and time. No aphasia or dysarthria. Fund of knowledge is appropriate.  Recent and remote memory are intact.  Attention and concentration are normal.    Able to name objects and repeat phrases. CDT 5/5  MMSE - Mini Mental State Exam 12/05/2017 07/08/2017  Not completed: - (No Data)  Orientation to time 5 -  Orientation to Place 5 -  Registration 3 -  Attention/ Calculation 4 -  Recall 3 -  Language- name 2 objects 2 -  Language- repeat 1 -  Language- follow 3 step command 3 -  Language- read & follow direction 1 -  Write a sentence 1 -  Copy design 1 -  Total score 29 -   Cranial nerves: Pupils equal, round, reactive to light.  Extraocular movements intact with no nystagmus. Visual fields full. Facial sensation intact. No facial asymmetry. Tongue, uvula, palate midline.  Motor: Bulk and tone normal, muscle  strength 5/5 throughout with no pronator drift. Sensation to light touch intact.  No extinction to double simultaneous stimulation.  Deep tendon reflexes +2 throughout, toes downgoing.  Finger to nose, heel to shin testing intact, no dysdiadochokinesia.  Gait narrow-based and steady, able to tandem walk adequately.  Romberg negative.  IMPRESSION: This is a pleasant 67 yo RH man with vascular risk factors including hypertension, hyperlipidemia, peripheral vascular disease, strong family history of dementia, who I had been seeing for memory issues. MMSE today normal 29/30.  He had a transient episode of dysequilibrium to the right side with associated hearing and vision changes last 04/15/17. MRI negative for acute infarct, stroke workup negative. Symptoms suggestive of possible TIA versus presyncopal event. Around 4 weeks ago, he had another episode where it is unclear if he completely lost consciousness, that occurred as he stood up. Symptoms suggestive of vasovagal presyncope/?syncope. Neurological exam today normal. He does not have any orthostasis today. We discussed control of vascular risk factors, continue aspirin and Plavix, increase fluid intake, liberalize salt intake. Discuss event with his cardiologist, Dr. Fletcher Anon as well. He knows to go to the ER immediately for any sudden changes in symptoms. Continue to monitor BP. Follow-up in 6 months, he knows to call for any changes.   Thank you for allowing me to participate  in his care.  Please do not hesitate to call for any questions or concerns.  The duration of this appointment visit was 26 minutes of face-to-face time with the patient.  Greater than 50% of this time was spent in counseling, explanation of diagnosis, planning of further management, and coordination of care.   Ellouise Newer, M.D.   CC: Dr. [name], Dr. Fletcher Anon

## 2017-12-05 NOTE — Patient Instructions (Signed)
1. Continue all your medications, would avoid Ambien 2. Monitor BP regularly 3. Increase fluid intake, liberalize salt intake 4. Follow-up in 6 months or so, call for any changes  RECOMMENDATIONS FOR ALL PATIENTS WITH MEMORY PROBLEMS: 1. Continue to exercise (Recommend 30 minutes of walking everyday, or 3 hours every week) 2. Increase social interactions - continue going to [place] and enjoy social gatherings with friends and family 3. Eat healthy, avoid fried foods and eat more fruits and vegetables 4. Maintain adequate blood pressure, blood sugar, and blood cholesterol level. Reducing the risk of stroke and cardiovascular disease also helps promoting better memory. 5. Avoid stressful situations. Live a simple life and avoid aggravations. Organize your time and prepare for the next day in anticipation. 6. Sleep well, avoid any interruptions of sleep and avoid any distractions in the bedroom that may interfere with adequate sleep quality 7. Avoid sugar, avoid sweets as there is a strong link between excessive sugar intake, diabetes, and cognitive impairment The Mediterranean diet has been shown to help patients reduce the risk of progressive memory disorders and reduces cardiovascular risk. This includes eating fish, eat fruits and green leafy vegetables, nuts like almonds and hazelnuts, walnuts, and also use olive oil. Avoid fast foods and fried foods as much as possible. Avoid sweets and sugar as sugar use has been linked to worsening of memory function.

## 2017-12-17 ENCOUNTER — Ambulatory Visit: Payer: Medicare Other | Admitting: Family Medicine

## 2018-03-24 ENCOUNTER — Ambulatory Visit (HOSPITAL_COMMUNITY)
Admission: RE | Admit: 2018-03-24 | Discharge: 2018-03-24 | Disposition: A | Discharge status: Home / Self Care | Payer: Medicare Other | Source: Ambulatory Visit | Attending: Cardiovascular Disease | Admitting: Cardiovascular Disease

## 2018-03-24 DIAGNOSIS — I739 Peripheral vascular disease, unspecified: Secondary | ICD-10-CM | POA: Diagnosis not present

## 2018-03-27 ENCOUNTER — Telehealth: Payer: Self-pay | Admitting: *Deleted

## 2018-03-27 DIAGNOSIS — I739 Peripheral vascular disease, unspecified: Secondary | ICD-10-CM

## 2018-03-27 NOTE — Telephone Encounter (Signed)
-----   Message from Wellington Hampshire, MD sent at 03/26/2018  8:48 AM EST ----- Normal ABI with patent stent in the left leg.  Repeat studies in 1 year.

## 2018-03-27 NOTE — Telephone Encounter (Signed)
Patient made aware of results and verbalized understanding. Repeat orders placed.    

## 2018-06-02 ENCOUNTER — Ambulatory Visit: Payer: Medicare Other | Admitting: Cardiovascular Disease

## 2018-06-03 ENCOUNTER — Encounter: Payer: Self-pay | Admitting: *Deleted

## 2018-06-17 ENCOUNTER — Telehealth: Payer: Self-pay

## 2018-06-17 NOTE — Telephone Encounter (Signed)
Author phoned pt. to assess interest in rescheduling awv for virtual awv. Pt. Stated he would rather push it out for later in the year. Appointment rescheduled with [facility] for 8/24 at [place]. Pt. denies any needs at this time.

## 2018-07-06 ENCOUNTER — Encounter: Payer: Self-pay | Admitting: Neurology

## 2018-07-14 ENCOUNTER — Ambulatory Visit: Payer: Medicare Other | Admitting: Cardiovascular Disease

## 2018-07-15 ENCOUNTER — Ambulatory Visit: Payer: Medicare Other

## 2018-07-15 ENCOUNTER — Ambulatory Visit: Payer: Medicare Other | Admitting: Neurology

## 2018-07-24 ENCOUNTER — Telehealth: Payer: Self-pay | Admitting: Cardiovascular Disease

## 2018-07-24 NOTE — Telephone Encounter (Signed)
Encounter not needed

## 2018-07-28 ENCOUNTER — Telehealth (INDEPENDENT_AMBULATORY_CARE_PROVIDER_SITE_OTHER): Payer: Medicare Other | Admitting: Cardiovascular Disease

## 2018-07-28 DIAGNOSIS — I739 Peripheral vascular disease, unspecified: Secondary | ICD-10-CM

## 2018-07-28 NOTE — Patient Instructions (Signed)
Medication Instructions:  Continue same medications.  If you need a refill on your cardiac medications before your next appointment, please call your pharmacy.   Lab work: None If you have labs (blood work) drawn today and your tests are completely normal, you will receive your results only by: Marland Kitchen MyChart Message (if you have MyChart) OR . A paper copy in the mail If you have any lab test that is abnormal or we need to change your treatment, we will call you to review the results.  Testing/Procedures: None  Follow-Up: At [facility], you and your health needs are our priority.  As part of our continuing mission to provide you with exceptional heart care, we have created designated Provider Care Teams.  These Care Teams include your primary Cardiologist (physician) and Advanced Practice Providers (APPs -  Physician Assistants and Nurse Practitioners) who all work together to provide you with the care you need, when you need it. You will need a follow up appointment in 6 months.  Please call our office 2 months in advance to schedule this appointment.  You may see Kathlyn Sacramento, MD or one of the following Advanced Practice Providers on your designated Care Team:   Kerin Ransom, PA-C Roby Lofts, [place] . Sande Rives, PA-C

## 2018-07-28 NOTE — Progress Notes (Signed)
Virtual Visit via Telephone Note   This visit type was conducted due to national recommendations for restrictions regarding the COVID-19 Pandemic (e.g. social distancing) in an effort to limit this patient's exposure and mitigate transmission in our community.  Due to his co-morbid illnesses, this patient is at least at moderate risk for complications without adequate follow up.  This format is felt to be most appropriate for this patient at this time.  The patient did not have access to video technology/had technical difficulties with video requiring transitioning to audio format only (telephone).  All issues noted in this document were discussed and addressed.  No physical exam could be performed with this format.  Please refer to the patient's chart for his  consent to telehealth for [name].   Date:  07/28/2018   ID:  David David, DOB 01-12-1951, MRN [id number]  Patient Location: Home Provider Location: Office  PCP:  [name], [name], MD  Cardiologist:  Kathlyn Sacramento, MD  Electrophysiologist:  None   Evaluation Performed:  Follow-Up Visit  Chief Complaint: No complaints today.  History of Present Illness:    David David is a 68 y.o. male who was reached via phone today for follow-up visit regarding peripheral arterial disease. The patient has lower extremity peripheral arterial disease with recurrent atheroembolic events to the left foot due to distal left popliteal artery stenosis with previous Cypher drug-eluting stent placement which was used in order to treat an ulcerated plaque with distal embolization.  He was seen in 11/2012  for a painless rash involving the left great toe and medial foot, with typical appearance of an athero embolic event. Echocardiogram was unremarkable.  CTA of the chest abdomen and pelvis with runoff to the feet demonstrated scattered atheromatous the aorta but no significant findings.  The left tibioperoneal stent had greater than 70%  stenosis. ABI was normal but there was significant dampening in pressure noted in the big toe. I proceeded with angiography which confirmed this and also showed an occluded dorsalis pedis likely from embolization. I performed balloon angioplasty with an Angioscore balloon to both ostial anterior tibial and ostial tibioperoneal trunk.  He had cardiac catheterization in May of 2016 which showed moderate left circumflex stenosis and no evidence of obstructive disease.  He had dizziness and presyncope last year and thought to have a TIA but neurologic evaluation was overall unremarkable. He has been doing well with no recent chest pain, shortness of breath or claudication.  He takes his medications regularly.  Unfortunately, he resumed smoking.  The patient does not have symptoms concerning for COVID-19 infection (fever, chills, cough, or new shortness of breath).    Past Medical History:  Diagnosis Date   Alcoholism in family    Anxiety    Arthritis    Depressive disorder, not elsewhere classified    Diverticulosis of colon (without mention of hemorrhage)    Dysfunction of eustachian tube    Exposure to STD    Family history of diabetes mellitus    Family history of psychiatric condition    family Hx of suicide    GERD (gastroesophageal reflux disease)    Impotence of organic origin    erectile dysfunction   Lipoprotein deficiencies    Other diseases of lung, not elsewhere classified    pulmonary nodule   Peripheral vascular disease, unspecified ([place])    lower extremity peripheral artery disease   Personal history of colonic polyps    Routine general medical examination at a  health care facility    Seizures ([place])    Sprain of neck    Hx   Thyroid disease    Tobacco use disorder    Unspecified hypertensive kidney disease with chronic kidney disease stage I through stage IV, or unspecified(403.90)    cholesterol embolization syndrome   Past Surgical History:   Procedure Laterality Date   angiography     bilateral lower extremity angiography, stenting of the left.    CARDIAC CATHETERIZATION N/A 07/19/2014   Procedure: Left Heart Cath and Coronary Angiography;  Surgeon: Leonie Man, MD;  Location: [facility] INVASIVE CV LAB CUPID;  Service: Cardiovascular;  Laterality: N/A;   CATARACT EXTRACTION Bilateral    EYE SURGERY  2016   blown macular   KNEE ARTHROSCOPY Left    x 2   LOWER EXTREMITY ANGIOGRAM N/A 11/04/2012   Procedure: LOWER EXTREMITY ANGIOGRAM;  Surgeon: Wellington Hampshire, MD;  Location: [facility] CATH LAB;  Service: Cardiovascular;  Laterality: N/A;   POPLITEAL ARTERY ANGIOPLASTY     didnt specify angioplasty or stent   SPERMATOCELECTOMY Left    testicular cyst   VASECTOMY       No outpatient medications have been marked as taking for the 07/28/18 encounter (Appointment) with Wellington Hampshire, MD.     Allergies:   Tape; Neomycin-bacitracin zn-polymyx; Niacin; and Paroxetine   Social History   Tobacco Use   Smoking status: Former Smoker    Packs/day: 1.00    Years: 50.00    Pack years: 50.00    Types: Cigarettes    Start date: 07/22/2016   Smokeless tobacco: Never Used   Tobacco comment: has some off and on smoking - [facility]  Substance Use Topics   Alcohol use: Yes    Alcohol/week: 0.0 standard drinks    Comment: social   Drug use: Yes    Types: Marijuana     Family Hx: The patient's family history includes Alzheimer's disease in his brother and sister; CAD in his father; Congestive Heart Failure in his mother; Diabetes in his brother; Lung cancer in his brother.  ROS:   Please see the history of present illness.     All other systems reviewed and are negative.   Prior CV studies:   The following studies were reviewed today:  Reviewed most recent vascular studies with him  Labs/Other Tests and Data Reviewed:    EKG:  No ECG reviewed.  Recent Labs: 09/16/2017: BUN 14; Creatinine, Ser 1.08;  Hemoglobin 15.3; Platelets 122.0; Potassium 4.4; Sodium 143; TSH 2.95   Recent Lipid Panel Lab Results  Component Value Date/Time   CHOL 126 04/17/2017 03:44 AM   TRIG 150 (H) 04/17/2017 03:44 AM   HDL 36 (L) 04/17/2017 03:44 AM   CHOLHDL 3.5 04/17/2017 03:44 AM   LDLCALC 60 04/17/2017 03:44 AM    Wt Readings from Last 3 Encounters:  12/05/17 184 lb (83.5 kg)  09/16/17 177 lb 8 oz (80.5 kg)  07/08/17 174 lb 5 oz (79.1 kg)     Objective:    Vital Signs:  There were no vitals taken for this visit.   VITAL SIGNS:  reviewed  ASSESSMENT & PLAN:    1.  Peripheral arterial disease: The patient has no claudication or lower extremity ulceration.    Noninvasive vascular studies in January showed normal ABI and patent stent.  Continue medical therapy and indefinite dual antiplatelet therapy.  2. Tobacco use:  Unfortunately, he resumed smoking.  I discussed with  him the importance of smoking cessation.  3. Essential hypertension: Continue same medications.  4. Hyperlipidemia: Continue treatment with pravastatin with a target LDL of less than 70.    Most recent LDL was 60.  COVID-19 Education: The signs and symptoms of COVID-19 were discussed with the patient and how to seek care for testing (follow up with PCP or arrange E-visit).  The importance of social distancing was discussed today.  Time:   Today, I have spent 15 minutes with the patient with telehealth technology discussing the above problems.     Medication Adjustments/Labs and Tests Ordered: Current medicines are reviewed at length with the patient today.  Concerns regarding medicines are outlined above.   Tests Ordered: No orders of the defined types were placed in this encounter.   Medication Changes: No orders of the defined types were placed in this encounter.   Disposition:  Follow up in 6 month(s)  Signed, Kathlyn Sacramento, MD  07/28/2018 9:19 AM    [facility]

## 2018-08-26 ENCOUNTER — Other Ambulatory Visit: Payer: Self-pay

## 2018-08-26 ENCOUNTER — Other Ambulatory Visit: Payer: Medicare Other

## 2018-08-26 DIAGNOSIS — R6889 Other general symptoms and signs: Secondary | ICD-10-CM | POA: Diagnosis not present

## 2018-08-26 DIAGNOSIS — Z20822 Contact with and (suspected) exposure to covid-19: Secondary | ICD-10-CM

## 2018-09-01 LAB — NOVEL CORONAVIRUS, NAA: SARS-CoV-2, NAA: NOT DETECTED

## 2018-09-17 NOTE — Telephone Encounter (Signed)
Open n error °

## 2018-11-09 ENCOUNTER — Ambulatory Visit: Payer: Medicare Other

## 2018-11-19 ENCOUNTER — Other Ambulatory Visit: Payer: Self-pay | Admitting: Orthopedic Surgery

## 2018-11-19 DIAGNOSIS — M4712 Other spondylosis with myelopathy, cervical region: Secondary | ICD-10-CM | POA: Diagnosis not present

## 2018-11-19 DIAGNOSIS — M542 Cervicalgia: Secondary | ICD-10-CM | POA: Diagnosis not present

## 2018-11-19 DIAGNOSIS — M50121 Cervical disc disorder at C4-C5 level with radiculopathy: Secondary | ICD-10-CM | POA: Diagnosis not present

## 2018-11-19 DIAGNOSIS — M4312 Spondylolisthesis, cervical region: Secondary | ICD-10-CM | POA: Diagnosis not present

## 2018-11-19 DIAGNOSIS — M50122 Cervical disc disorder at C5-C6 level with radiculopathy: Secondary | ICD-10-CM | POA: Diagnosis not present

## 2018-11-20 ENCOUNTER — Emergency Department (HOSPITAL_BASED_OUTPATIENT_CLINIC_OR_DEPARTMENT_OTHER)
Admission: EM | Admit: 2018-11-20 | Discharge: 2018-11-20 | Disposition: A | Discharge status: Home / Self Care | Payer: No Typology Code available for payment source | Attending: Emergency Medicine | Admitting: Emergency Medicine

## 2018-11-20 ENCOUNTER — Other Ambulatory Visit: Payer: Self-pay

## 2018-11-20 ENCOUNTER — Encounter (HOSPITAL_BASED_OUTPATIENT_CLINIC_OR_DEPARTMENT_OTHER): Payer: Self-pay | Admitting: *Deleted

## 2018-11-20 DIAGNOSIS — Y999 Unspecified external cause status: Secondary | ICD-10-CM | POA: Diagnosis not present

## 2018-11-20 DIAGNOSIS — Y939 Activity, unspecified: Secondary | ICD-10-CM | POA: Insufficient documentation

## 2018-11-20 DIAGNOSIS — Y929 Unspecified place or not applicable: Secondary | ICD-10-CM | POA: Diagnosis not present

## 2018-11-20 DIAGNOSIS — Z79899 Other long term (current) drug therapy: Secondary | ICD-10-CM | POA: Diagnosis not present

## 2018-11-20 DIAGNOSIS — X58XXXA Exposure to other specified factors, initial encounter: Secondary | ICD-10-CM | POA: Diagnosis not present

## 2018-11-20 DIAGNOSIS — Z7982 Long term (current) use of aspirin: Secondary | ICD-10-CM | POA: Insufficient documentation

## 2018-11-20 DIAGNOSIS — S90821A Blister (nonthermal), right foot, initial encounter: Secondary | ICD-10-CM

## 2018-11-20 DIAGNOSIS — I739 Peripheral vascular disease, unspecified: Secondary | ICD-10-CM | POA: Diagnosis not present

## 2018-11-20 DIAGNOSIS — N181 Chronic kidney disease, stage 1: Secondary | ICD-10-CM | POA: Diagnosis not present

## 2018-11-20 DIAGNOSIS — E039 Hypothyroidism, unspecified: Secondary | ICD-10-CM | POA: Insufficient documentation

## 2018-11-20 DIAGNOSIS — Z7901 Long term (current) use of anticoagulants: Secondary | ICD-10-CM | POA: Diagnosis not present

## 2018-11-20 DIAGNOSIS — Z87891 Personal history of nicotine dependence: Secondary | ICD-10-CM | POA: Diagnosis not present

## 2018-11-20 DIAGNOSIS — Z8673 Personal history of transient ischemic attack (TIA), and cerebral infarction without residual deficits: Secondary | ICD-10-CM | POA: Insufficient documentation

## 2018-11-20 DIAGNOSIS — I129 Hypertensive chronic kidney disease with stage 1 through stage 4 chronic kidney disease, or unspecified chronic kidney disease: Secondary | ICD-10-CM | POA: Diagnosis not present

## 2018-11-20 DIAGNOSIS — S90822A Blister (nonthermal), left foot, initial encounter: Secondary | ICD-10-CM | POA: Insufficient documentation

## 2018-11-20 NOTE — Discharge Instructions (Addendum)
You have neuropathy and peripheral artery disease. You are at a higher risk of infection and need to have these wounds looked at in the next 3-5 days. [facility] or [facility] specialist Audelia Hives (see attached information).   Keep area clean and monitor for signs of infection such as fever, redness, drainage from the area. Check wound daily.

## 2018-11-20 NOTE — ED Triage Notes (Addendum)
Blisters on the bottoms of both feet since last night. Hx of neuropathy.

## 2018-11-20 NOTE — ED Provider Notes (Signed)
[facility] EMERGENCY DEPARTMENT Provider Note   CSN: [id number] Arrival date & time: 11/20/18  1414     History   Chief Complaint No chief complaint on file.   HPI [name] is a 68 y.o. male. Patient presents for bilateral blisters on the bottom of his feet that he noticed when walking to the bathroom this morning and saw fluid on the ground.  Patient has peripheral artery disease and neuropathy and believes the blisters developed after he walked on the hot concrete barefoot yesterday to get the mail.   Patient denies any fever, chills, redness, swelling, drainage from the areas.      HPI  Past Medical History:  Diagnosis Date  . Alcoholism in family   . Anxiety   . Arthritis   . Depressive disorder, not elsewhere classified   . Diverticulosis of colon (without mention of hemorrhage)   . Dysfunction of eustachian tube   . Exposure to STD   . Family history of diabetes mellitus   . Family history of psychiatric condition    family Hx of suicide   . GERD (gastroesophageal reflux disease)   . Impotence of organic origin    erectile dysfunction  . Lipoprotein deficiencies   . Other diseases of lung, not elsewhere classified    pulmonary nodule  . Peripheral vascular disease, unspecified ([place])    lower extremity peripheral artery disease  . Personal history of colonic polyps   . Routine general medical examination at a health care facility   . Seizures ([place])   . Sprain of neck    Hx  . Thyroid disease   . Tobacco use disorder   . Unspecified hypertensive kidney disease with chronic kidney disease stage I through stage IV, or unspecified(403.90)    cholesterol embolization syndrome    Patient Active Problem List   Diagnosis Date Noted  . Near syncope 04/17/2017  . TIA (transient ischemic attack) 04/16/2017  . Arthritis of carpometacarpal [facility]) joint of left thumb 07/04/2016  . COPD (chronic obstructive pulmonary disease) ([place]) 07/01/2016  .  Family history of first degree relative with dementia 03/30/2015  . Neuropathy 03/30/2015  . Memory loss 02/21/2015  . Essential hypertension 07/19/2014  . Hyperlipidemia 07/19/2014  . Hypothyroidism 07/19/2014  . Malnutrition of moderate degree ([place]) 07/19/2014  . Unstable angina ([place])   . PAD (peripheral artery disease) ([place])   . TOBACCO ABUSE 09/27/2008  . CHOLESTEROL EMBOLIZATION SYNDROME 09/27/2008  . PERIPHERAL VASCULAR DISEASE 09/27/2008  . GERD 09/27/2008  . DYSFUNCTION, EUSTACHIAN TUBE 10/09/2006  . LOW HDL 07/28/2006  . Depression, major, in partial remission ([place]) 07/28/2006  . PULMONARY NODULE 07/28/2006  . DIVERTICULOSIS, COLON 07/28/2006  . ERECTILE DYSFUNCTION, ORGANIC 07/28/2006  . SPRAIN/STRAIN, NECK 07/28/2006  . COLONIC POLYPS, HX OF 07/28/2006    Past Surgical History:  Procedure Laterality Date  . angiography     bilateral lower extremity angiography, stenting of the left.   Marland Kitchen CARDIAC CATHETERIZATION N/A 07/19/2014   Procedure: Left Heart Cath and Coronary Angiography;  Surgeon: David Man, MD;  Location: [facility] INVASIVE CV LAB CUPID;  Service: Cardiovascular;  Laterality: N/A;  . CATARACT EXTRACTION Bilateral   . EYE SURGERY  2016   blown macular  . KNEE ARTHROSCOPY Left    x 2  . LOWER EXTREMITY ANGIOGRAM N/A 11/04/2012   Procedure: LOWER EXTREMITY ANGIOGRAM;  Surgeon: David Hampshire, MD;  Location: [facility] CATH LAB;  Service: Cardiovascular;  Laterality: N/A;  . POPLITEAL ARTERY ANGIOPLASTY  didnt specify angioplasty or stent  . SPERMATOCELECTOMY Left    testicular cyst  . VASECTOMY          Home Medications    Prior to Admission medications   Medication Sig Start Date End Date Taking? Authorizing Provider  albuterol (PROVENTIL HFA;VENTOLIN HFA) 108 (90 Base) MCG/ACT inhaler Inhale 2 puffs into the lungs as needed for wheezing or shortness of breath.   Yes [provider]  alfuzosin (UROXATRAL) 10 MG 24 hr tablet Take 10 mg by mouth at  bedtime. 03/24/17  Yes [provider]  amLODipine (NORVASC) 10 MG tablet Take 5 mg by mouth daily.     Yes [provider]  ammonium lactate (LAC-HYDRIN) 12 % lotion Apply 1 application topically 2 (two) times daily as needed for dry skin.   Yes [provider]  aspirin EC 81 MG tablet Take 1 tablet (81 mg total) by mouth daily. 10/09/12  Yes Sherren Mocha, MD  cholecalciferol (VITAMIN D) 1000 UNITS tablet Take 1,000 Units by mouth daily.   Yes [provider]  clopidogrel (PLAVIX) 75 MG tablet Take 1 tablet (75 mg total) by mouth daily. 11/04/12  Yes David Hampshire, MD  cyclobenzaprine (FLEXERIL) 10 MG tablet Take 1 tablet (10 mg total) by mouth at bedtime. 10/28/12  Yes Sherren Mocha, MD  escitalopram (LEXAPRO) 10 MG tablet Take 30 mg by mouth every morning.  10/28/12  Yes Sherren Mocha, MD  fluticasone (FLONASE) 50 MCG/ACT nasal spray PLACE 1 SPRAY INTO BOTH NOSTRILS DAILY. 07/14/17  Yes Billie Ruddy, MD  gabapentin (NEURONTIN) 300 MG capsule Take 600 mg by mouth 2 (two) times daily.   Yes [provider]  hydrOXYzine (ATARAX) 50 MG tablet Take 50 mg by mouth 2 (two) times daily.    Yes [provider]  mirtazapine (REMERON) 15 MG tablet Take 1 tablet (15 mg total) by mouth at bedtime. 10/28/16  Yes [name], David G, MD  multivitamin [facility]) per tablet Take 1 tablet by mouth daily.     Yes [provider]  Omega-3 Fatty Acids (FISH OIL) 1000 MG CAPS Take 1,000 mg by mouth 2 (two) times daily.    Yes [provider]  omeprazole (PRILOSEC) 40 MG capsule Take 40 mg by mouth daily.   Yes [provider]  pravastatin (PRAVACHOL) 40 MG tablet Take 40 mg by mouth at bedtime.   Yes [provider]  vitamin E 600 UNIT capsule Take 600 Units by mouth daily.     Yes [provider]  zolpidem (AMBIEN) 10 MG tablet Take 5 mg by mouth at bedtime.   Yes [provider]  Calcium  Carb-Cholecalciferol (CALCIUM-VITAMIN D) 500-200 MG-UNIT tablet Take 1 tablet by mouth daily.    [provider]  cetirizine (ZYRTEC) 10 MG tablet Take 1 tablet (10 mg total) by mouth daily. 06/20/17   Billie Ruddy, MD  levothyroxine (SYNTHROID, LEVOTHROID) 75 MCG tablet Take 1 tablet (75 mcg total) by mouth daily before breakfast. 10/28/12   Sherren Mocha, MD    Family History Family History  Problem Relation Age of Onset  . CAD Father   . Congestive Heart Failure Mother   . Lung cancer Brother   . Alzheimer's disease Brother   . Diabetes Brother   . Alzheimer's disease Sister     Social History Social History   Tobacco Use  . Smoking status: Former Smoker    Packs/day: 1.00    Years: 50.00  Pack years: 50.00    Types: Cigarettes    Start date: 07/22/2016  . Smokeless tobacco: Never Used  . Tobacco comment: has some off and on smoking - [facility]  Substance Use Topics  . Alcohol use: Yes    Alcohol/week: 0.0 standard drinks    Comment: social  . Drug use: Yes    Types: Marijuana     Allergies   Tape, Neomycin-bacitracin zn-polymyx, Niacin, and Paroxetine   Review of Systems Review of Systems  Constitutional: Negative for chills and fever.  Respiratory: Negative for cough and shortness of breath.   Cardiovascular: Negative for chest pain.  Musculoskeletal: Negative for myalgias.  Skin: Positive for wound. Negative for rash.  Neurological: Positive for numbness (chronic). Negative for dizziness, light-headedness and headaches.     Physical Exam Updated Vital Signs BP (!) 161/84   Pulse 81   Temp 98.9 F (37.2 C) (Oral)   Resp 16   Ht 6\' 3"  (1.905 m)   Wt 82.4 kg   SpO2 98%   BMI 22.71 kg/m   Physical Exam Vitals signs and nursing note reviewed.  Constitutional:      General: He is not in acute distress. HENT:     Head: Normocephalic and atraumatic.     Nose: Nose normal.  Eyes:     General: No scleral icterus.  Cardiovascular:     Rate and Rhythm: Normal rate and regular rhythm.     Heart sounds: No murmur. No friction rub. No gallop.      Comments: DP and PT pulses palpable bilateral lower extremities Pulmonary:     Effort: Pulmonary effort is normal.     Breath sounds: Normal breath sounds.  Musculoskeletal:     Right lower leg: No edema.     Left lower leg: No edema.  Skin:    General: Skin is warm and dry.     Capillary Refill: Capillary refill takes less than 2 seconds.     Comments: Undersurfaces of bilateral feet with blistering of skin spanning the entire plantar forefoot.  No surrounding erythema, area is clean and devoid of purulence or foreign material.  Exposed underlying skin is red and appears to be healing well.  Neurological:     Mental Status: He is alert. Mental status is at baseline.     Comments: Patient has decreased sensation in bilateral lower extremities  Psychiatric:        Mood and Affect: Mood normal.        Behavior: Behavior normal.      ED Treatments / Results  Labs (all labs ordered are listed, but only abnormal results are displayed) Labs Reviewed - No data to display  EKG None  Radiology No results found.  Procedures Procedures (including critical care time)  Medications Ordered in ED Medications - No data to display   Initial Impression / Assessment and Plan / ED Course  I have reviewed the triage vital signs and the nursing notes.  Pertinent labs & imaging results that were available during my care of the patient were reviewed by me and considered in my medical decision making (see chart for details).  Patient is 68 year old male with PAD and neuropathy with bilateral foot blisters that broke open this morning.  Patient states he has close follow-up with a dermatology appointment at the [facility] on Tuesday for a dermatology evaluation and will have his feet looked at then.  Patient given return precautions and states he will return  to the  department or make an appointment with burn specialist if any concerning symptoms occur.  Patient is afebrile and comfortable on exam today wounds appear infected at this time.  Blood pressure is elevated but patient denies any worrisome symptoms and attributes this to being in the ED today.  Patient discharged with Xeroform dressing, strict wound care plan and understanding of need for close follow-up.  Patient is agreeable to plan.       Final Clinical Impressions(s) / ED Diagnoses   Final diagnoses:  None    ED Discharge Orders    None       Tedd Sias, [place] 11/20/18 1517    Maudie Flakes, MD 11/21/18 1500

## 2018-11-20 NOTE — ED Notes (Signed)
ED Provider at bedside. 

## 2018-11-21 ENCOUNTER — Ambulatory Visit
Admission: RE | Admit: 2018-11-21 | Discharge: 2018-11-21 | Disposition: A | Discharge status: Home / Self Care | Payer: Medicare Other | Source: Ambulatory Visit | Attending: Orthopedic Surgery | Admitting: Orthopedic Surgery

## 2018-11-21 DIAGNOSIS — M4802 Spinal stenosis, cervical region: Secondary | ICD-10-CM | POA: Diagnosis not present

## 2018-11-21 DIAGNOSIS — M542 Cervicalgia: Secondary | ICD-10-CM

## 2018-11-23 ENCOUNTER — Emergency Department (HOSPITAL_BASED_OUTPATIENT_CLINIC_OR_DEPARTMENT_OTHER)
Admission: EM | Admit: 2018-11-23 | Discharge: 2018-11-23 | Disposition: A | Discharge status: Home / Self Care | Payer: No Typology Code available for payment source | Attending: Emergency Medicine | Admitting: Emergency Medicine

## 2018-11-23 ENCOUNTER — Encounter (HOSPITAL_BASED_OUTPATIENT_CLINIC_OR_DEPARTMENT_OTHER): Payer: Self-pay | Admitting: *Deleted

## 2018-11-23 ENCOUNTER — Other Ambulatory Visit: Payer: Self-pay

## 2018-11-23 DIAGNOSIS — J449 Chronic obstructive pulmonary disease, unspecified: Secondary | ICD-10-CM | POA: Insufficient documentation

## 2018-11-23 DIAGNOSIS — L03115 Cellulitis of right lower limb: Secondary | ICD-10-CM | POA: Insufficient documentation

## 2018-11-23 DIAGNOSIS — Z7982 Long term (current) use of aspirin: Secondary | ICD-10-CM | POA: Diagnosis not present

## 2018-11-23 DIAGNOSIS — Z79899 Other long term (current) drug therapy: Secondary | ICD-10-CM | POA: Diagnosis not present

## 2018-11-23 DIAGNOSIS — Z5189 Encounter for other specified aftercare: Secondary | ICD-10-CM

## 2018-11-23 DIAGNOSIS — I739 Peripheral vascular disease, unspecified: Secondary | ICD-10-CM | POA: Insufficient documentation

## 2018-11-23 DIAGNOSIS — Z4800 Encounter for change or removal of nonsurgical wound dressing: Secondary | ICD-10-CM | POA: Diagnosis not present

## 2018-11-23 DIAGNOSIS — I10 Essential (primary) hypertension: Secondary | ICD-10-CM | POA: Insufficient documentation

## 2018-11-23 DIAGNOSIS — L03116 Cellulitis of left lower limb: Secondary | ICD-10-CM | POA: Insufficient documentation

## 2018-11-23 DIAGNOSIS — Z87891 Personal history of nicotine dependence: Secondary | ICD-10-CM | POA: Insufficient documentation

## 2018-11-23 MED ORDER — DOXYCYCLINE HYCLATE 100 MG PO TABS
100.0000 mg | ORAL_TABLET | Freq: Once | ORAL | Status: AC
  Administered 2018-11-23: 14:00:00 100 mg via ORAL
  Filled 2018-11-23: qty 1

## 2018-11-23 MED ORDER — DOXYCYCLINE HYCLATE 100 MG PO CAPS
100.0000 mg | ORAL_CAPSULE | Freq: Two times a day (BID) | ORAL | 0 refills | Status: AC
Start: 2018-11-23 — End: 2018-11-30

## 2018-11-23 NOTE — ED Provider Notes (Signed)
[facility] EMERGENCY DEPARTMENT Provider Note   CSN: [id number] Arrival date & time: 11/23/18  1220     History   Chief Complaint Chief Complaint  Patient presents with  . Recheck Blisters on his Feet    HPI David David is a 68 y.o. male with past medical history of PVD and peripheral neuropathy, hypertension, presenting to the emergency department for wound check.  He was evaluated on 11/20/2018 for blisters to his bilateral feet after walking on the hot pavement without shoes.  He states he does not have much sensation to his feet which is his baseline due to the neuropathy.  He was discharged with symptomatic management.  He has an outpatient dermatology appointment tomorrow.  He presents today with some increased redness to the dorsum of both of his feet.  He states it is not painful though he does not have much sensation.  He denies fevers, chills, or any systemic symptoms.  He does have a history of MRSA infection in the past after a surgery on his left knee.  He denies history of immunocompromise or diabetes.  He has been providing wound care to his feet and has had a little bit of bloody drainage though denies any purulence to the wounds.     The history is provided by the patient and medical records.    Past Medical History:  Diagnosis Date  . Alcoholism in family   . Anxiety   . Arthritis   . Depressive disorder, not elsewhere classified   . Diverticulosis of colon (without mention of hemorrhage)   . Dysfunction of eustachian tube   . Exposure to STD   . Family history of diabetes mellitus   . Family history of psychiatric condition    family Hx of suicide   . GERD (gastroesophageal reflux disease)   . Impotence of organic origin    erectile dysfunction  . Lipoprotein deficiencies   . Other diseases of lung, not elsewhere classified    pulmonary nodule  . Peripheral vascular disease, unspecified ([place])    lower extremity peripheral artery disease  .  Personal history of colonic polyps   . Routine general medical examination at a health care facility   . Seizures ([place])   . Sprain of neck    Hx  . Thyroid disease   . Tobacco use disorder   . Unspecified hypertensive kidney disease with chronic kidney disease stage I through stage IV, or unspecified(403.90)    cholesterol embolization syndrome    Patient Active Problem List   Diagnosis Date Noted  . Near syncope 04/17/2017  . TIA (transient ischemic attack) 04/16/2017  . Arthritis of carpometacarpal [facility]) joint of left thumb 07/04/2016  . COPD (chronic obstructive pulmonary disease) ([place]) 07/01/2016  . Family history of first degree relative with dementia 03/30/2015  . Neuropathy 03/30/2015  . Memory loss 02/21/2015  . Essential hypertension 07/19/2014  . Hyperlipidemia 07/19/2014  . Hypothyroidism 07/19/2014  . Malnutrition of moderate degree ([place]) 07/19/2014  . Unstable angina ([place])   . PAD (peripheral artery disease) ([place])   . TOBACCO ABUSE 09/27/2008  . CHOLESTEROL EMBOLIZATION SYNDROME 09/27/2008  . PERIPHERAL VASCULAR DISEASE 09/27/2008  . GERD 09/27/2008  . DYSFUNCTION, EUSTACHIAN TUBE 10/09/2006  . LOW HDL 07/28/2006  . Depression, major, in partial remission ([place]) 07/28/2006  . PULMONARY NODULE 07/28/2006  . DIVERTICULOSIS, COLON 07/28/2006  . ERECTILE DYSFUNCTION, ORGANIC 07/28/2006  . SPRAIN/STRAIN, NECK 07/28/2006  . COLONIC POLYPS, HX OF 07/28/2006  Past Surgical History:  Procedure Laterality Date  . angiography     bilateral lower extremity angiography, stenting of the left.   Marland Kitchen CARDIAC CATHETERIZATION N/A 07/19/2014   Procedure: Left Heart Cath and Coronary Angiography;  Surgeon: Leonie Man, MD;  Location: [facility] INVASIVE CV LAB CUPID;  Service: Cardiovascular;  Laterality: N/A;  . CATARACT EXTRACTION Bilateral   . EYE SURGERY  2016   blown macular  . KNEE ARTHROSCOPY Left    x 2  . LOWER EXTREMITY ANGIOGRAM N/A 11/04/2012   Procedure: LOWER  EXTREMITY ANGIOGRAM;  Surgeon: Wellington Hampshire, MD;  Location: [facility] CATH LAB;  Service: Cardiovascular;  Laterality: N/A;  . POPLITEAL ARTERY ANGIOPLASTY     didnt specify angioplasty or stent  . SPERMATOCELECTOMY Left    testicular cyst  . VASECTOMY          Home Medications    Prior to Admission medications   Medication Sig Start Date End Date Taking? Authorizing Provider  albuterol (PROVENTIL HFA;VENTOLIN HFA) 108 (90 Base) MCG/ACT inhaler Inhale 2 puffs into the lungs as needed for wheezing or shortness of breath.    [provider]  alfuzosin (UROXATRAL) 10 MG 24 hr tablet Take 10 mg by mouth at bedtime. 03/24/17   [provider]  amLODipine (NORVASC) 10 MG tablet Take 5 mg by mouth daily.      [provider]  ammonium lactate (LAC-HYDRIN) 12 % lotion Apply 1 application topically 2 (two) times daily as needed for dry skin.    [provider]  aspirin EC 81 MG tablet Take 1 tablet (81 mg total) by mouth daily. 10/09/12   Sherren Mocha, MD  Calcium Carb-Cholecalciferol (CALCIUM-VITAMIN D) 500-200 MG-UNIT tablet Take 1 tablet by mouth daily.    [provider]  cetirizine (ZYRTEC) 10 MG tablet Take 1 tablet (10 mg total) by mouth daily. 06/20/17   Billie Ruddy, MD  cholecalciferol (VITAMIN D) 1000 UNITS tablet Take 1,000 Units by mouth daily.    [provider]  clopidogrel (PLAVIX) 75 MG tablet Take 1 tablet (75 mg total) by mouth daily. 11/04/12   Wellington Hampshire, MD  cyclobenzaprine (FLEXERIL) 10 MG tablet Take 1 tablet (10 mg total) by mouth at bedtime. 10/28/12   Sherren Mocha, MD  doxycycline (VIBRAMYCIN) 100 MG capsule Take 1 capsule (100 mg total) by mouth 2 (two) times daily for 7 days. 11/23/18 11/30/18  Lucifer Soja, [name] N, PA-C  escitalopram (LEXAPRO) 10 MG tablet Take 30 mg by mouth every morning.  10/28/12   Sherren Mocha, MD  fluticasone (FLONASE) 50 MCG/ACT nasal spray PLACE 1 SPRAY INTO BOTH NOSTRILS DAILY. 07/14/17    Billie Ruddy, MD  gabapentin (NEURONTIN) 300 MG capsule Take 600 mg by mouth 2 (two) times daily.    [provider]  hydrOXYzine (ATARAX) 50 MG tablet Take 50 mg by mouth 2 (two) times daily.     [provider]  levothyroxine (SYNTHROID, LEVOTHROID) 75 MCG tablet Take 1 tablet (75 mcg total) by mouth daily before breakfast. 10/28/12   Sherren Mocha, MD  mirtazapine (REMERON) 15 MG tablet Take 1 tablet (15 mg total) by mouth at bedtime. 10/28/16   [name], [name], MD  multivitamin [facility]) per tablet Take 1 tablet by mouth daily.      [provider]  Omega-3 Fatty Acids (FISH OIL) 1000 MG CAPS Take 1,000 mg by mouth 2 (two) times daily.     [provider]  omeprazole (PRILOSEC) 40  MG capsule Take 40 mg by mouth daily.    [provider]  pravastatin (PRAVACHOL) 40 MG tablet Take 40 mg by mouth at bedtime.    [provider]  vitamin E 600 UNIT capsule Take 600 Units by mouth daily.      [provider]  zolpidem (AMBIEN) 10 MG tablet Take 5 mg by mouth at bedtime.    [provider]    Family History Family History  Problem Relation Age of Onset  . CAD Father   . Congestive Heart Failure Mother   . Lung cancer Brother   . Alzheimer's disease Brother   . Diabetes Brother   . Alzheimer's disease Sister     Social History Social History   Tobacco Use  . Smoking status: Former Smoker    Packs/day: 1.00    Years: 50.00    Pack years: 50.00    Types: Cigarettes    Start date: 07/22/2016  . Smokeless tobacco: Never Used  . Tobacco comment: has some off and on [facility]  Substance Use Topics  . Alcohol use: Yes    Alcohol/week: 0.0 standard drinks    Comment: social  . Drug use: Yes    Types: Marijuana     Allergies   Tape, Neomycin-bacitracin zn-polymyx, Niacin, and Paroxetine   Review of Systems Review of Systems  Constitutional: Negative for fever.  Skin: Positive for  color change and wound.  Allergic/Immunologic: Negative for immunocompromised state.  All other systems reviewed and are negative.    Physical Exam Updated Vital Signs BP (!) 151/77   Pulse 60   Temp 97.8 F (36.6 C) (Oral)   Resp 12   Ht 6\' 3"  (1.905 m)   Wt 80.7 kg   SpO2 100%   BMI 22.25 kg/m   Physical Exam Vitals signs and nursing note reviewed.  Constitutional:      General: He is not in acute distress.    Appearance: He is well-developed.  HENT:     Head: Normocephalic and atraumatic.  Eyes:     Conjunctiva/sclera: Conjunctivae normal.  Cardiovascular:     Rate and Rhythm: Normal rate.  Pulmonary:     Effort: Pulmonary effort is normal.  Abdominal:     Palpations: Abdomen is soft.  Skin:    General: Skin is warm.     Comments: The plantar aspect of bilateral forefeet with large deroofed blisters.  The wound beds are red and healthy appearing.  There is some clear serous drainage though no purulence.  The dorsum of the distal portion of b/l feet have some redness, warmth and mild swelling present.  Patient has palpable PT pulses bilaterally  Neurological:     Mental Status: He is alert.  Psychiatric:        Behavior: Behavior normal.      ED Treatments / Results  Labs (all labs ordered are listed, but only abnormal results are displayed) Labs Reviewed - No data to display  EKG None  Radiology  Procedures Procedures (including critical care time)  Medications Ordered in ED Medications  doxycycline (VIBRA-TABS) tablet 100 mg (has no administration in time range)     Initial Impression / Assessment and Plan / ED Course  I have reviewed the triage vital signs and the nursing notes.  Pertinent labs & imaging results that were available during my care of the patient were reviewed by me and considered in my medical decision making (see chart for  details).        Patient with history of PVD and peripheral neuropathy, presenting for wound recheck  of blisters to bilateral plantar feet after walking on the hot pavement.  He has been caring for his wounds as directed.  He noticed some increased redness to his dorsal feet and toes that began on Saturday.  He has had no fevers or systemic symptoms.  No history of immunocompromise or diabetes.  His exam today is consistent with a mild cellulitis.  The wounds appear to be healthy without signs of infection to the wound.  Patient discussed with Dr. Alvino Chapel.  Will cover with doxycycline.  Encouraged he have rechecked tomorrow by his dermatologist at his appointment.  Return precautions discussed.  Safe for discharge.  Discussed results, findings, treatment and follow up. Patient advised of return precautions. Patient verbalized understanding and agreed with plan.  Final Clinical Impressions(s) / ED Diagnoses   Final diagnoses:  Encounter for wound re-check  Cellulitis of foot, left  Cellulitis of foot, right    ED Discharge Orders         Ordered    doxycycline (VIBRAMYCIN) 100 MG capsule  2 times daily     11/23/18 1410           Analie Katzman, [name] N, [place] 11/23/18 1411    Davonna Belling, MD 11/23/18 1457

## 2018-11-23 NOTE — ED Triage Notes (Signed)
He was seen on Friday for blisters on the bottom of his feet. He was told to follow up with his MD. He came here for a recheck.

## 2018-11-23 NOTE — Discharge Instructions (Signed)
Continue caring for your wounds daily, cleaning them with soap and water gently.  Pat them dry and apply a clean nonstick bandage. Begin taking the antibiotic every 12 hours until gone.  Have your wounds and feet rechecked by your dermatologist tomorrow at your appointment. Follow-up with your primary care provider later in the week to ensure improvement. Return to the emergency department if you develop fever, chills, or worsening redness/swelling after you have taken the antibiotic for 2 days.

## 2018-11-24 DIAGNOSIS — M4712 Other spondylosis with myelopathy, cervical region: Secondary | ICD-10-CM | POA: Diagnosis not present

## 2018-11-24 DIAGNOSIS — M4722 Other spondylosis with radiculopathy, cervical region: Secondary | ICD-10-CM | POA: Diagnosis not present

## 2018-11-24 DIAGNOSIS — M50122 Cervical disc disorder at C5-C6 level with radiculopathy: Secondary | ICD-10-CM | POA: Diagnosis not present

## 2018-11-24 DIAGNOSIS — M4312 Spondylolisthesis, cervical region: Secondary | ICD-10-CM | POA: Diagnosis not present

## 2018-12-01 ENCOUNTER — Other Ambulatory Visit: Payer: Self-pay

## 2018-12-01 DIAGNOSIS — Z20822 Contact with and (suspected) exposure to covid-19: Secondary | ICD-10-CM

## 2018-12-01 DIAGNOSIS — R6889 Other general symptoms and signs: Secondary | ICD-10-CM | POA: Diagnosis not present

## 2018-12-02 ENCOUNTER — Telehealth (INDEPENDENT_AMBULATORY_CARE_PROVIDER_SITE_OTHER): Payer: Medicare Other | Admitting: Family Medicine

## 2018-12-02 ENCOUNTER — Other Ambulatory Visit: Payer: Self-pay

## 2018-12-02 VITALS — HR 68 | Resp 12

## 2018-12-02 DIAGNOSIS — L03116 Cellulitis of left lower limb: Secondary | ICD-10-CM | POA: Diagnosis not present

## 2018-12-02 DIAGNOSIS — S91309D Unspecified open wound, unspecified foot, subsequent encounter: Secondary | ICD-10-CM | POA: Diagnosis not present

## 2018-12-02 DIAGNOSIS — I10 Essential (primary) hypertension: Secondary | ICD-10-CM

## 2018-12-02 LAB — NOVEL CORONAVIRUS, NAA: SARS-CoV-2, NAA: NOT DETECTED

## 2018-12-02 MED ORDER — DOXYCYCLINE HYCLATE 100 MG PO TABS: 100.0000 mg | ORAL_TABLET | Freq: Two times a day (BID) | ORAL | 0 refills | Status: AC

## 2018-12-02 MED ORDER — SODIUM CHLORIDE 0.9 % IN NEBU: 3.0000 mL | INHALATION_SOLUTION | RESPIRATORY_TRACT | 12 refills | Status: DC | PRN

## 2018-12-02 MED ORDER — "GAUZE DRESSING 4""X4"" PADS": MEDICATED_PAD | 1 refills | Status: DC

## 2018-12-05 NOTE — Progress Notes (Signed)
Virtual Visit via Video Note   I connected with David David on 12/02/18 by a video enabled telemedicine application and verified that I am speaking with the correct person using two identifiers.  Location patient: home Location provider:work office Persons participating in the virtual visit: patient, provider  I discussed the limitations of evaluation and management by telemedicine and the availability of in person appointments. The patient expressed understanding and agreed to proceed.   HPI:  David David is a 68 yo male with Hx of anxiety,peripheral neuropathy,PAD,HTN,and COPD following on recent ER visits. This was supposed to be an OV but because he has a COVID 19 test result pending,visit was changed to virtual,he is in the parking lot. He has been in the ER on 9/4 and 11/23/18.  On 11/20/18 he noted clear fluid on floor where he was standing. The day before he went to get his mail barefoot and apparently blisters form, he had no pain. Open wound has been treated in the ER. Completed abx treatment and he is applying Silvadene.  He is concerned about erythematous area L>R Denies fever,chills,unusual fatigue, cyanosis,or drainage. His daughter is helping him with wound care. He was referral to plastic surgeon.  Negative for CP,dyspnea,palpitations, bdominal pain,N/V,LE edema, leg pain.  BP mildly elevated in the ER. Denies severe/frequent headache, or visual changes.  PAD, he follows with cardiologist and has appt for doppler US in 11/20220 (per pt report)  ROS: See pertinent positives and negatives per HPI.  Past Medical History:  Diagnosis Date  . Alcoholism in family   . Anxiety   . Arthritis   . Depressive disorder, not elsewhere classified   . Diverticulosis of colon (without mention of hemorrhage)   . Dysfunction of eustachian tube   . Exposure to STD   . Family history of diabetes mellitus   . Family history of psychiatric condition    family Hx of suicide   . GERD  (gastroesophageal reflux disease)   . Impotence of organic origin    erectile dysfunction  . Lipoprotein deficiencies   . Other diseases of lung, not elsewhere classified    pulmonary nodule  . Peripheral vascular disease, unspecified ([place])    lower extremity peripheral artery disease  . Personal history of colonic polyps   . Routine general medical examination at a health care facility   . Seizures ([place])   . Sprain of neck    Hx  . Thyroid disease   . Tobacco use disorder   . Unspecified hypertensive kidney disease with chronic kidney disease stage I through stage IV, or unspecified(403.90)    cholesterol embolization syndrome    Past Surgical History:  Procedure Laterality Date  . angiography     bilateral lower extremity angiography, stenting of the left.   Marland Kitchen CARDIAC CATHETERIZATION N/A 07/19/2014   Procedure: Left Heart Cath and Coronary Angiography;  Surgeon: Leonie Man, MD;  Location: [facility] INVASIVE CV LAB CUPID;  Service: Cardiovascular;  Laterality: N/A;  . CATARACT EXTRACTION Bilateral   . EYE SURGERY  2016   blown macular  . KNEE ARTHROSCOPY Left    x 2  . LOWER EXTREMITY ANGIOGRAM N/A 11/04/2012   Procedure: LOWER EXTREMITY ANGIOGRAM;  Surgeon: Wellington Hampshire, MD;  Location: [facility] CATH LAB;  Service: Cardiovascular;  Laterality: N/A;  . POPLITEAL ARTERY ANGIOPLASTY     didnt specify angioplasty or stent  . SPERMATOCELECTOMY Left    testicular cyst  . VASECTOMY      Family History  Problem Relation Age of Onset  . CAD Father   . Congestive Heart Failure Mother   . Lung cancer Brother   . Alzheimer's disease Brother   . Diabetes Brother   . Alzheimer's disease Sister     Social History   Socioeconomic History  . Marital status: Single    Spouse name: Not on file  . Number of children: 1  . Years of education: Not on file  . Highest education level: Not on file  Occupational History  . Occupation: retired  Scientific laboratory technician  . Financial resource strain:  Not on file  . Food insecurity    Worry: Not on file    Inability: Not on file  . Transportation needs    Medical: Not on file    Non-medical: Not on file  Tobacco Use  . Smoking status: Former Smoker    Packs/day: 1.00    Years: 50.00    Pack years: 50.00    Types: Cigarettes    Start date: 07/22/2016  . Smokeless tobacco: Never Used  . Tobacco comment: has some off and on smoking - [facility]  Substance and Sexual Activity  . Alcohol use: Yes    Alcohol/week: 0.0 standard drinks    Comment: social  . Drug use: Yes    Types: Marijuana  . Sexual activity: Not on file  Lifestyle  . Physical activity    Days per week: Not on file    Minutes per session: Not on file  . Stress: Not on file  Relationships  . Social Herbalist on phone: Not on file    Gets together: Not on file    Attends religious service: Not on file    Active member of club or organization: Not on file    Attends meetings of clubs or organizations: Not on file    Relationship status: Not on file  . Intimate partner violence    Fear of current or ex partner: Not on file    Emotionally abused: Not on file    Physically abused: Not on file    Forced sexual activity: Not on file  Other Topics Concern  . Not on file  Social History Narrative   No diet, not restricting cholesterol.    Current Outpatient Medications:  .  albuterol (PROVENTIL HFA;VENTOLIN HFA) 108 (90 Base) MCG/ACT inhaler, Inhale 2 puffs into the lungs as needed for wheezing or shortness of breath., Disp: , Rfl:  .  alfuzosin (UROXATRAL) 10 MG 24 hr tablet, Take 10 mg by mouth at bedtime., Disp: , Rfl: 11 .  amLODipine (NORVASC) 10 MG tablet, Take 5 mg by mouth daily.  , Disp: , Rfl:  .  ammonium lactate (LAC-HYDRIN) 12 % lotion, Apply 1 application topically 2 (two) times daily as needed for dry skin., Disp: , Rfl:  .  aspirin EC 81 MG tablet, Take 1 tablet (81 mg total) by mouth daily., Disp: 90 tablet, Rfl: 3 .  Calcium  Carb-Cholecalciferol (CALCIUM-VITAMIN D) 500-200 MG-UNIT tablet, Take 1 tablet by mouth daily., Disp: , Rfl:  .  cetirizine (ZYRTEC) 10 MG tablet, Take 1 tablet (10 mg total) by mouth daily., Disp: 30 tablet, Rfl: 0 .  cholecalciferol (VITAMIN D) 1000 UNITS tablet, Take 1,000 Units by mouth daily., Disp: , Rfl:  .  clopidogrel (PLAVIX) 75 MG tablet, Take 1 tablet (75 mg total) by mouth daily., Disp: 30 tablet, Rfl: 3 .  cyclobenzaprine (FLEXERIL) 10 MG tablet,  Take 1 tablet (10 mg total) by mouth at bedtime., Disp: 30 tablet, Rfl:  .  doxycycline (VIBRA-TABS) 100 MG tablet, Take 1 tablet (100 mg total) by mouth 2 (two) times daily for 7 days., Disp: 14 tablet, Rfl: 0 .  escitalopram (LEXAPRO) 10 MG tablet, Take 30 mg by mouth every morning. , Disp: , Rfl:  .  fluticasone (FLONASE) 50 MCG/ACT nasal spray, PLACE 1 SPRAY INTO BOTH NOSTRILS DAILY., Disp: 16 g, Rfl: 3 .  gabapentin (NEURONTIN) 300 MG capsule, Take 600 mg by mouth 2 (two) times daily., Disp: , Rfl:  .  Gauze Pads & Dressings (GAUZE DRESSING) 4"X4" PADS, Use as instructed for wound care., Disp: 30 each, Rfl: 1 .  hydrOXYzine (ATARAX) 50 MG tablet, Take 50 mg by mouth 2 (two) times daily. , Disp: , Rfl:  .  levothyroxine (SYNTHROID, LEVOTHROID) 75 MCG tablet, Take 1 tablet (75 mcg total) by mouth daily before breakfast., Disp: , Rfl:  .  mirtazapine (REMERON) 15 MG tablet, Take 1 tablet (15 mg total) by mouth at bedtime., Disp: 30 tablet, Rfl: 2 .  multivitamin (THERAGRAN) per tablet, Take 1 tablet by mouth daily.  , Disp: , Rfl:  .  Omega-3 Fatty Acids (FISH OIL) 1000 MG CAPS, Take 1,000 mg by mouth 2 (two) times daily. , Disp: , Rfl:  .  omeprazole (PRILOSEC) 40 MG capsule, Take 40 mg by mouth daily., Disp: , Rfl:  .  pravastatin (PRAVACHOL) 40 MG tablet, Take 40 mg by mouth at bedtime., Disp: , Rfl:  .  sodium chloride 0.9 % nebulizer solution, Take 3 mLs by nebulization as needed for wheezing., Disp: 90 mL, Rfl: 12 .  vitamin E 600  UNIT capsule, Take 600 Units by mouth daily.  , Disp: , Rfl:  .  zolpidem (AMBIEN) 10 MG tablet, Take 5 mg by mouth at bedtime., Disp: , Rfl:   EXAM:  VITALS per patient if applicable:Pulse 68   Resp 12   GENERAL: alert, oriented, appears well and in no acute distress  HEENT: atraumatic, conjunctiva clear, no obvious abnormalities on inspection.  LUNGS: on inspection no signs of respiratory distress, breathing rate appears normal, no obvious gross SOB, gasping or wheezing  CV: no obvious cyanosis DP present bilateral. MS: moves all visible extremities without noticeable abnormality  SKIN: Plantar ulcers bilateral forefoot, right ones cleat. Left side plantar on 5th MTP joint ulcer with yellowish tissue layer. No drainage. Dorsum with erythema, L>R. No edema,no local heat,or tenderness.  PSYCH/NEURO: pleasant and cooperative, no obvious depression,very anxious. Speech and thought processing grossly intact  ASSESSMENT AND PLAN:  Discussed the following assessment and plan:  Open wound of foot, unspecified laterality, subsequent encounter - Plan: sodium chloride 0.9 % nebulizer solution, Gauze Pads & Dressings (GAUZE DRESSING) 4"X4" PADS Healing well. I went to his care and changed dressings. Recommend avoiding topical silvadene on ulcers but rather around. Keep areas clean with soap and water.  Strongly recommend avoiding walking barefoot and to check feet a few times per week.   Cellulitis of left lower extremity - Plan: doxycycline (VIBRA-TABS) 100 MG tablet Mild. Another course of abx. Side effects discussed. Instructed about warning signs.  Essential hypertension Recommend monitoring BP at home. Continue Amlodipine 10 mg daily. F/U in 2 weeks.  32 min face to face OV. > 50% was dedicated to discussion of Dx, prognosis, and signs/symptom of complications. Very anxious,needed reassurance.  I discussed the assessment and treatment plan with the patient. He was  provided an opportunity to ask questions and all were answered.He agreed with the plan and demonstrated an understanding of the instructions.    Return in about 2 weeks (around 12/16/2018) for wound.    Lamon Rotundo [name], MD

## 2018-12-06 ENCOUNTER — Encounter: Payer: Self-pay | Admitting: Family Medicine

## 2018-12-06 NOTE — Progress Notes (Signed)
Offered wound clinic referral and [facility] wound care but he feels like his daughter is "doing a good job." He would lit me know if he changes up his mind. Small wound on left plantar area, instructed to scrub gently to try to remove layer of yellowish tissue.

## 2018-12-10 DIAGNOSIS — M4312 Spondylolisthesis, cervical region: Secondary | ICD-10-CM | POA: Diagnosis not present

## 2018-12-10 DIAGNOSIS — M50122 Cervical disc disorder at C5-C6 level with radiculopathy: Secondary | ICD-10-CM | POA: Diagnosis not present

## 2018-12-10 DIAGNOSIS — M4712 Other spondylosis with myelopathy, cervical region: Secondary | ICD-10-CM | POA: Diagnosis not present

## 2018-12-10 DIAGNOSIS — M4722 Other spondylosis with radiculopathy, cervical region: Secondary | ICD-10-CM | POA: Diagnosis not present

## 2018-12-11 ENCOUNTER — Other Ambulatory Visit: Payer: Self-pay

## 2018-12-11 ENCOUNTER — Encounter: Payer: Self-pay | Admitting: Family Medicine

## 2018-12-11 ENCOUNTER — Ambulatory Visit (INDEPENDENT_AMBULATORY_CARE_PROVIDER_SITE_OTHER): Payer: No Typology Code available for payment source | Admitting: Family Medicine

## 2018-12-11 VITALS — BP 120/68 | HR 63 | Temp 97.7°F | Resp 12 | Ht 75.0 in | Wt 183.1 lb

## 2018-12-11 DIAGNOSIS — I10 Essential (primary) hypertension: Secondary | ICD-10-CM | POA: Diagnosis not present

## 2018-12-11 DIAGNOSIS — M25472 Effusion, left ankle: Secondary | ICD-10-CM | POA: Diagnosis not present

## 2018-12-11 DIAGNOSIS — I739 Peripheral vascular disease, unspecified: Secondary | ICD-10-CM | POA: Diagnosis not present

## 2018-12-11 DIAGNOSIS — L03116 Cellulitis of left lower limb: Secondary | ICD-10-CM

## 2018-12-11 DIAGNOSIS — G629 Polyneuropathy, unspecified: Secondary | ICD-10-CM

## 2018-12-11 DIAGNOSIS — S91309D Unspecified open wound, unspecified foot, subsequent encounter: Secondary | ICD-10-CM | POA: Diagnosis not present

## 2018-12-11 LAB — BASIC METABOLIC PANEL
BUN: 14 mg/dL (ref 6–23)
CO2: 26 mEq/L (ref 19–32)
Calcium: 9.8 mg/dL (ref 8.4–10.5)
Chloride: 105 mEq/L (ref 96–112)
Creatinine, Ser: 1.03 mg/dL (ref 0.40–1.50)
GFR: 71.43 mL/min (ref >60.00)
Glucose, Bld: 92 mg/dL (ref 70–99)
Potassium: 4.6 mEq/L (ref 3.5–5.1)
Sodium: 142 mEq/L (ref 135–145)

## 2018-12-11 LAB — CBC
HCT: 40 % (ref 39.0–52.0)
Hemoglobin: 13 g/dL (ref 13.0–17.0)
MCHC: 32.5 g/dL (ref 30.0–36.0)
MCV: 90.6 fl (ref 78.0–100.0)
Platelets: 308 10*3/uL (ref 150.0–400.0)
RBC: 4.42 Mil/uL (ref 4.22–5.81)
RDW: 13.3 % (ref 11.5–15.5)
WBC: 5.9 10*3/uL (ref 4.0–10.5)

## 2018-12-11 NOTE — Progress Notes (Signed)
HPI:   David David is a 68 y.o. male, who is here today to follow on recent visit. He was seen on 12/02/2018, when he was evaluated for plantar wounds after burning feet when walking barefoot on hot pavement. He has kept the wound clean with soap and water and applying with compresses with saline. He reporting that wounds are slowly healing. He denies fever, chills, unusual fatigue, or body aches.  He wonders if he needs more antibiotics, still has some erythematous areas on some towels. There is no pain or discharge.  He is also concerned about left ankle edema, it is almost resolved in the morning and worse at the end of the day. Lower extremity elevation seems to help. He is concerned about this being a sign of infection.  He denies gross hematuria, decreased urine output, or foam in urine.  Hypertension, currently he is on amlodipine 10 mg daily He denies unusual headache, visual changes, chest pain, dyspnea, palpitations, orthopnea, or PND.  PAD: He is on Plavix 75 mg daily and pravastatin 40 mg daily.  Peripheral neuropathy: Currently he is on gabapentin 600 mg twice daily. He is checking feet a few times per week.   Review of Systems  Constitutional: Negative for activity change, chills and fever.  HENT: Negative for mouth sores and sore throat.   Respiratory: Negative for cough and wheezing.   Gastrointestinal: Negative for abdominal pain, nausea and vomiting.  Genitourinary: Negative for difficulty urinating.  Musculoskeletal: Positive for gait problem.  Neurological: Negative for syncope and weakness.  Psychiatric/Behavioral: Negative for confusion. The patient is nervous/anxious.   Rest see pertinent positives and negatives per HPI.   Current Outpatient Medications on File Prior to Visit  Medication Sig Dispense Refill  . albuterol (PROVENTIL HFA;VENTOLIN HFA) 108 (90 Base) MCG/ACT inhaler Inhale 2 puffs into the lungs as needed for wheezing or  shortness of breath.    . alfuzosin (UROXATRAL) 10 MG 24 hr tablet Take 10 mg by mouth at bedtime.  11  . amLODipine (NORVASC) 10 MG tablet Take 5 mg by mouth daily.      Marland Kitchen ammonium lactate (LAC-HYDRIN) 12 % lotion Apply 1 application topically 2 (two) times daily as needed for dry skin.    Marland Kitchen aspirin EC 81 MG tablet Take 1 tablet (81 mg total) by mouth daily. 90 tablet 3  . cetirizine (ZYRTEC) 10 MG tablet Take 1 tablet (10 mg total) by mouth daily. 30 tablet 0  . clopidogrel (PLAVIX) 75 MG tablet Take 1 tablet (75 mg total) by mouth daily. 30 tablet 3  . cyclobenzaprine (FLEXERIL) 10 MG tablet Take 1 tablet (10 mg total) by mouth at bedtime. 30 tablet   . escitalopram (LEXAPRO) 10 MG tablet Take 30 mg by mouth every morning.     . gabapentin (NEURONTIN) 300 MG capsule Take 600 mg by mouth 2 (two) times daily.    . Gauze Pads & Dressings (GAUZE DRESSING) 4"X4" PADS Use as instructed for wound care. 30 each 1  . hydrOXYzine (ATARAX) 50 MG tablet Take 50 mg by mouth 2 (two) times daily.     . mirtazapine (REMERON) 15 MG tablet Take 1 tablet (15 mg total) by mouth at bedtime. 30 tablet 2  . Multiple Vitamin (MULTI-VITAMIN) tablet Take by mouth. theragran    . Omega-3 Fatty Acids (FISH OIL) 1000 MG CAPS Take 1,000 mg by mouth 2 (two) times daily.     Marland Kitchen omeprazole (PRILOSEC) 40 MG capsule  Take 40 mg by mouth daily.    . pravastatin (PRAVACHOL) 40 MG tablet Take 40 mg by mouth at bedtime.    . sodium chloride 0.9 % nebulizer solution Take 3 mLs by nebulization as needed for wheezing. 90 mL 12  . zolpidem (AMBIEN) 10 MG tablet Take 5 mg by mouth at bedtime.     No current facility-administered medications on file prior to visit.      Past Medical History:  Diagnosis Date  . Alcoholism in family   . Anxiety   . Arthritis   . Depressive disorder, not elsewhere classified   . Diverticulosis of colon (without mention of hemorrhage)   . Dysfunction of eustachian tube   . Exposure to STD   .  Family history of diabetes mellitus   . Family history of psychiatric condition    family Hx of suicide   . GERD (gastroesophageal reflux disease)   . Impotence of organic origin    erectile dysfunction  . Lipoprotein deficiencies   . Other diseases of lung, not elsewhere classified    pulmonary nodule  . Peripheral vascular disease, unspecified ([place])    lower extremity peripheral artery disease  . Personal history of colonic polyps   . Routine general medical examination at a health care facility   . Seizures ([place])   . Sprain of neck    Hx  . Thyroid disease   . Tobacco use disorder   . Unspecified hypertensive kidney disease with chronic kidney disease stage I through stage IV, or unspecified(403.90)    cholesterol embolization syndrome   Allergies  Allergen Reactions  . Tape Other (See Comments)    "Plastic" tape TEARS THE SKIN; please use paper tape or Coban wrap!!  . Neomycin-Bacitracin Zn-Polymyx Rash  . Niacin Rash  . Paroxetine Other (See Comments)    Makes the patient feel SEVERELY SEDATED/LETHARGIC/CANNOT SPEAK WORDS Other reaction(s): Lethargy (intolerance) REACTION: goofy    Social History   Socioeconomic History  . Marital status: Single    Spouse name: Not on file  . Number of children: 1  . Years of education: Not on file  . Highest education level: Not on file  Occupational History  . Occupation: retired  Scientific laboratory technician  . Financial resource strain: Not on file  . Food insecurity    Worry: Not on file    Inability: Not on file  . Transportation needs    Medical: Not on file    Non-medical: Not on file  Tobacco Use  . Smoking status: Former Smoker    Packs/day: 1.00    Years: 50.00    Pack years: 50.00    Types: Cigarettes    Start date: 07/22/2016  . Smokeless tobacco: Never Used  . Tobacco comment: has some off and on [facility] - [facility]  Substance and Sexual Activity  . Alcohol use: Yes    Alcohol/week: 0.0 standard drinks     Comment: social  . Drug use: Yes    Types: Marijuana  . Sexual activity: Not on file  Lifestyle  . Physical activity    Days per week: Not on file    Minutes per session: Not on file  . Stress: Not on file  Relationships  . Social Herbalist on phone: Not on file    Gets together: Not on file    Attends religious service: Not on file    Active member of club or organization: Not on file  Attends meetings of clubs or organizations: Not on file    Relationship status: Not on file  Other Topics Concern  . Not on file  Social History Narrative   No diet, not restricting cholesterol.    Vitals:   12/11/18 1212  BP: 120/68  Pulse: 63  Resp: 12  Temp: 97.7 F (36.5 C)  SpO2: 96%   Body mass index is 22.89 kg/m.   Physical Exam  Nursing note reviewed. Constitutional: He is oriented to person, place, and time. He appears well-developed and well-nourished. No distress.  HENT:  Head: Normocephalic and atraumatic.  Mouth/Throat: Mucous membranes are normal.  Eyes: Conjunctivae are normal.  Cardiovascular: Normal rate and regular rhythm.  No murmur heard. Pulses:      Dorsalis pedis pulses are 2+ on the right side and 2+ on the left side.  Small varicose veins LE, bilateral.  Respiratory: Effort normal and breath sounds normal. No respiratory distress.  Musculoskeletal:        General: Edema (pitting 1+ edema left ankle) present.  Neurological: He is alert and oriented to person, place, and time. He has normal strength.  Mildly unstable gait,no assistance needed.  Skin: Skin is warm. No rash noted.  LLE hyperpigmentation changes. Mild erythema of 3rd,4th,and 5th left toe, no local heat or edema.  Plantar wounds clean, one in right foot healed. See picture.   Psychiatric: His mood appears anxious.  Well groomed, good eye contact.      ASSESSMENT AND PLAN:  Mr. Braylyn was seen today for follow-up.  Diagnoses and all orders for this visit:  Orders  Placed This Encounter  Procedures  . CBC  . Basic metabolic panel   Lab Results  Component Value Date   WBC 5.9 12/11/2018   HGB 13.0 12/11/2018   HCT 40.0 12/11/2018   MCV 90.6 12/11/2018   PLT 308.0 12/11/2018   Lab Results  Component Value Date   CREATININE 1.03 12/11/2018   BUN 14 12/11/2018   NA 142 12/11/2018   K 4.6 12/11/2018   CL 105 12/11/2018   CO2 26 12/11/2018    Open wound of foot, unspecified laterality, subsequent encounter Wounds are slowly healing. No signs of infection. Continue keeping wounds clean with soap and water. Instructed about warning signs. Follow-up in 4 weeks, before if needed.  Cellulitis of left lower extremity Resolved. I do not think antibiotic treatment is needed at this time. We discussed some side effects of antibiotic use, he completed 14 days of doxycycline. Continue monitoring for changes. He could continue applying Silvadene on erythematous areas of concerned when skin is intact.  Instructed about warning signs.  PAD (peripheral artery disease) (HCC) Peripheral pulses are present. Continue Plavix 75 mg daily and pravastatin 40 mg daily. Continue following with vascular.  -     CBC -     Basic metabolic panel  Edema of left ankle We discussed possible etiologies. Reassured in regard to infectious process. Recommend lower extremity elevation a few times during the day. Instructed about warning signs.  -     Basic metabolic panel  Essential hypertension BP adequately controlled. Continue amlodipine 10 mg daily. Continue low-salt diet.  Neuropathy Continue gabapentin 600 mg twice daily. Caution with hard shoe wear,left foot area with signs of constant pressure from shoe. Appropriate foot care discussed.   25 min face to face OV. > 50% was dedicated to discussion of Dx, prognosis, reassuring,and some side effects of medications.  Return in about 4 weeks (around  01/08/2019).   [name]. [name], MD  [facility]. [place] office.

## 2018-12-11 NOTE — Patient Instructions (Addendum)
A few things to remember from today's visit:   Open wound of foot, unspecified laterality, subsequent encounter  Cellulitis of left lower extremity  PAD (peripheral artery disease) ([place]) - Plan: CBC, Basic metabolic panel  Edema of left ankle - Plan: Basic metabolic panel  After completing 2 rounds of doxycycline I do not think more is needed. I think edema is related to vain dilation. Continue wound care as discussed, monitor for signs of infection,and elevate legs a few times during the afternoon to control edema.   Please be sure medication list is accurate. If a new problem present, please set up appointment sooner than planned today.

## 2018-12-13 ENCOUNTER — Encounter: Payer: Self-pay | Admitting: Family Medicine

## 2018-12-16 NOTE — Progress Notes (Signed)
Scheduled for 10/26

## 2018-12-17 DIAGNOSIS — H33321 Round hole, right eye: Secondary | ICD-10-CM | POA: Diagnosis not present

## 2018-12-28 ENCOUNTER — Telehealth: Payer: Self-pay | Admitting: *Deleted

## 2018-12-28 NOTE — Telephone Encounter (Signed)
Spoke with patient and informed him that office received paperwork and that surgical clearance form will be completed after office visit on 01/11/2019.  Copied from [place] [phone]. Topic: General - Other >> Dec 28, 2018  1:37 PM Keene Breath wrote: Reason for CRM: Patient called to ask the nurse to call him regarding a release letter for his surgery from [facility], Dr. Elgie Collard.  Please advise and call patient back to discuss.  CB# [phone] >> Dec 28, 2018  3:15 PM Cox, Melburn Hake, CMA wrote: Does pt need pre-op visit?

## 2019-01-05 DIAGNOSIS — M4312 Spondylolisthesis, cervical region: Secondary | ICD-10-CM | POA: Diagnosis not present

## 2019-01-05 DIAGNOSIS — M4712 Other spondylosis with myelopathy, cervical region: Secondary | ICD-10-CM | POA: Diagnosis not present

## 2019-01-05 DIAGNOSIS — M50122 Cervical disc disorder at C5-C6 level with radiculopathy: Secondary | ICD-10-CM | POA: Diagnosis not present

## 2019-01-05 DIAGNOSIS — M4722 Other spondylosis with radiculopathy, cervical region: Secondary | ICD-10-CM | POA: Diagnosis not present

## 2019-01-11 ENCOUNTER — Ambulatory Visit (INDEPENDENT_AMBULATORY_CARE_PROVIDER_SITE_OTHER): Payer: Medicare Other | Admitting: Family Medicine

## 2019-01-11 ENCOUNTER — Other Ambulatory Visit: Payer: Self-pay

## 2019-01-11 ENCOUNTER — Encounter: Payer: Self-pay | Admitting: Family Medicine

## 2019-01-11 VITALS — BP 126/78 | HR 75 | Temp 98.1°F | Resp 16 | Ht 75.0 in | Wt 183.0 lb

## 2019-01-11 DIAGNOSIS — I10 Essential (primary) hypertension: Secondary | ICD-10-CM

## 2019-01-11 DIAGNOSIS — Z01818 Encounter for other preprocedural examination: Secondary | ICD-10-CM

## 2019-01-11 DIAGNOSIS — Z23 Encounter for immunization: Secondary | ICD-10-CM

## 2019-01-11 DIAGNOSIS — M4722 Other spondylosis with radiculopathy, cervical region: Secondary | ICD-10-CM

## 2019-01-11 DIAGNOSIS — J449 Chronic obstructive pulmonary disease, unspecified: Secondary | ICD-10-CM | POA: Diagnosis not present

## 2019-01-11 DIAGNOSIS — I739 Peripheral vascular disease, unspecified: Secondary | ICD-10-CM | POA: Diagnosis not present

## 2019-01-11 DIAGNOSIS — S91309D Unspecified open wound, unspecified foot, subsequent encounter: Secondary | ICD-10-CM

## 2019-01-11 NOTE — Progress Notes (Signed)
HPI:   [name] is a 68 y.o. male with story of hypertension, hyperlipidemia, TIA,COPD, and PAD who is here today to follow-up on his last visit. He also needs a preop clearance requested by Dr. Marnee David, he is planning on having cervical spine surgery next Monday. He has been having severe cervical pain radiates to shoulder and occasional upper extremity paresthesias.  Spinal surgery is planned to last 2 to 3 hours, he would be under general anesthesia. Diagnosis: Spondylolisthesis in cervical region. Procedure: Anterior cervical discectomy and fusion.  Last surgery he remembers having under general anesthesia was left lower extremity artery stent placement. He denies any complication during or after the procedure and anesthesia was well-tolerated.  He denies any chest pain, dyspnea, palpitations, diaphoresis, or dizziness upon walking a hill or carrying heavy groceries.  Former smoker. COPD, he denies symptoms. He has not used Albuterol inh in a while.  No history of DM 2, CAD,CKD,or CHF. He follows with cardiologist, Dr David David, last seen on 07/28/18.   Lab Results  Component Value Date   CREATININE 1.03 12/11/2018   BUN 14 12/11/2018   NA 142 12/11/2018   K 4.6 12/11/2018   CL 105 12/11/2018   CO2 26 12/11/2018   Lab Results  Component Value Date   WBC 5.9 12/11/2018   HGB 13.0 12/11/2018   HCT 40.0 12/11/2018   MCV 90.6 12/11/2018   PLT 308.0 12/11/2018   Open wounds, plantar, bilateral. He is reporting that wounds are healing. He denies fever, chills, unusual fatigue, local erythema, edema, or drainage. Left plantar wound still oozing intermittently. He denies tenderness.  He states that today he has an appointment at the [place], [place] on having EKG done.  Review of Systems  Constitutional: Negative for activity change, appetite change and unexpected weight change.  HENT: Negative for nosebleeds and sore throat.   Eyes: Negative for redness  and visual disturbance.  Respiratory: Negative for cough and wheezing.   Cardiovascular: Negative for palpitations and leg swelling.  Gastrointestinal: Negative for abdominal pain, nausea and vomiting.  Genitourinary: Negative for decreased urine volume, dysuria and hematuria.  Musculoskeletal: Positive for arthralgias. Negative for gait problem.  Skin: Negative for pallor and rash.  Allergic/Immunologic: Positive for environmental allergies.  Neurological: Negative for syncope, weakness and headaches.  Psychiatric/Behavioral: Negative for confusion. The patient is nervous/anxious.   Rest see pertinent positives and negatives per HPI.  Current Outpatient Medications on File Prior to Visit  Medication Sig Dispense Refill  . albuterol (PROVENTIL HFA;VENTOLIN HFA) 108 (90 Base) MCG/ACT inhaler Inhale 2 puffs into the lungs as needed for wheezing or shortness of breath.    . alfuzosin (UROXATRAL) 10 MG 24 hr tablet Take 10 mg by mouth at bedtime.  11  . amLODipine (NORVASC) 10 MG tablet Take 5 mg by mouth daily.      Marland Kitchen ammonium lactate (LAC-HYDRIN) 12 % lotion Apply 1 application topically 2 (two) times daily as needed for dry skin.    Marland Kitchen aspirin EC 81 MG tablet Take 1 tablet (81 mg total) by mouth daily. 90 tablet 3  . cetirizine (ZYRTEC) 10 MG tablet Take 1 tablet (10 mg total) by mouth daily. 30 tablet 0  . clopidogrel (PLAVIX) 75 MG tablet Take 1 tablet (75 mg total) by mouth daily. 30 tablet 3  . cyclobenzaprine (FLEXERIL) 10 MG tablet Take 1 tablet (10 mg total) by mouth at bedtime. 30 tablet   . escitalopram (LEXAPRO) 10 MG tablet  Take 30 mg by mouth every morning.     . gabapentin (NEURONTIN) 300 MG capsule Take 600 mg by mouth 2 (two) times daily.    . Gauze Pads & Dressings (GAUZE DRESSING) 4"X4" PADS Use as instructed for wound care. 30 each 1  . hydrOXYzine (ATARAX) 50 MG tablet Take 50 mg by mouth 2 (two) times daily.     . mirtazapine (REMERON) 15 MG tablet Take 1 tablet (15 mg  total) by mouth at bedtime. 30 tablet 2  . Multiple Vitamin (MULTI-VITAMIN) tablet Take by mouth. theragran    . Omega-3 Fatty Acids (FISH OIL) 1000 MG CAPS Take 1,000 mg by mouth 2 (two) times daily.     Marland Kitchen omeprazole (PRILOSEC) 40 MG capsule Take 40 mg by mouth daily.    . pravastatin (PRAVACHOL) 40 MG tablet Take 40 mg by mouth at bedtime.    . sodium chloride 0.9 % nebulizer solution Take 3 mLs by nebulization as needed for wheezing. 90 mL 12  . zolpidem (AMBIEN) 10 MG tablet Take 5 mg by mouth at bedtime.     No current facility-administered medications on file prior to visit.      Past Medical History:  Diagnosis Date  . Alcoholism in family   . Anxiety   . Arthritis   . Depressive disorder, not elsewhere classified   . Diverticulosis of colon (without mention of hemorrhage)   . Dysfunction of eustachian tube   . Exposure to STD   . Family history of diabetes mellitus   . Family history of psychiatric condition    family Hx of suicide   . GERD (gastroesophageal reflux disease)   . Impotence of organic origin    erectile dysfunction  . Lipoprotein deficiencies   . Other diseases of lung, not elsewhere classified    pulmonary nodule  . Peripheral vascular disease, unspecified ([place])    lower extremity peripheral artery disease  . Personal history of colonic polyps   . Routine general medical examination at a health care facility   . Seizures ([place])   . Sprain of neck    Hx  . Thyroid disease   . Tobacco use disorder   . Unspecified hypertensive kidney disease with chronic kidney disease stage I through stage IV, or unspecified(403.90)    cholesterol embolization syndrome   Allergies  Allergen Reactions  . Tape Other (See Comments)    "Plastic" tape TEARS THE SKIN; please use paper tape or Coban wrap!!  . Neomycin-Bacitracin Zn-Polymyx Rash  . Niacin Rash  . Paroxetine Other (See Comments)    Makes the patient feel SEVERELY SEDATED/LETHARGIC/CANNOT SPEAK WORDS Other  reaction(s): Lethargy (intolerance) REACTION: goofy    Social History   Socioeconomic History  . Marital status: Single    Spouse name: Not on file  . Number of children: 1  . Years of education: Not on file  . Highest education level: Not on file  Occupational History  . Occupation: retired  Scientific laboratory technician  . Financial resource strain: Not on file  . Food insecurity    Worry: Not on file    Inability: Not on file  . Transportation needs    Medical: Not on file    Non-medical: Not on file  Tobacco Use  . Smoking status: Former Smoker    Packs/day: 1.00    Years: 50.00    Pack years: 50.00    Types: Cigarettes    Start date: 07/22/2016  . Smokeless tobacco: Never Used  .  Tobacco comment: has some off and on smoking - [facility]  Substance and Sexual Activity  . Alcohol use: Yes    Alcohol/week: 0.0 standard drinks    Comment: social  . Drug use: Yes    Types: Marijuana  . Sexual activity: Not on file  Lifestyle  . Physical activity    Days per week: Not on file    Minutes per session: Not on file  . Stress: Not on file  Relationships  . Social Herbalist on phone: Not on file    Gets together: Not on file    Attends religious service: Not on file    Active member of club or organization: Not on file    Attends meetings of clubs or organizations: Not on file    Relationship status: Not on file  Other Topics Concern  . Not on file  Social History Narrative   No diet, not restricting cholesterol.    Vitals:   01/11/19 0931  BP: 126/78  Pulse: 75  Resp: 16  Temp: 98.1 F (36.7 C)  SpO2: 96%   Body mass index is 22.87 kg/m.   Physical Exam  Nursing note and vitals reviewed. Constitutional: He is oriented to person, place, and time. He appears well-developed and well-nourished. No distress.  HENT:  Head: Normocephalic and atraumatic.  Mouth/Throat: Oropharynx is clear and moist and mucous membranes are normal.  Eyes: Pupils are  equal, round, and reactive to light. Conjunctivae are normal.  Cardiovascular: Normal rate and regular rhythm.  No murmur heard. Pulses:      Dorsalis pedis pulses are 2+ on the right side and 2+ on the left side.  Respiratory: Effort normal and breath sounds normal. No respiratory distress.  GI: Soft. He exhibits no mass. There is no hepatomegaly. There is no abdominal tenderness.  Musculoskeletal:        General: No edema.       Feet:  Lymphadenopathy:    He has no cervical adenopathy.  Neurological: He is alert and oriented to person, place, and time. He has normal strength. No cranial nerve deficit. Gait normal.  Skin: Skin is warm. No rash noted. No erythema.  Left forefoot plantar open wound with no active dranage but minimal serosanguinous stain on gauze. No edema or erythema. No tenderness.  Right foot with 1.5 cm rounded callus where wound was. No edema or erythema. No tenderness.  Psychiatric: His mood appears anxious.  Well groomed, good eye contact.    ASSESSMENT AND PLAN:  Mr. Glenroy was seen today for follow-up and preop evaluation.  Diagnoses and all orders for this visit:  Preoperative clearance Chronic health problem otherwise well controlled. In general risk perioperative complications is low but explained it is never zero. Class II risk based on revised cardiac risk index. Risk of severe complication based on Goldman cardia risk is low (1%). He recently had CBC and BMP. He is having EKG at the [place] today and has schedule appt with anesthesiologist.  DVT prophylaxis: Early ambulation. BP adequately controlled,it will be monitor through procedure.He can take Amlodipine. Already discontinued Aspirin and Plavix. Stop fish oil and OTC supplements, resume after surgery. HLD, he can take statin same day of surgery.  Form completed and signed,it will be faxed to Dr Sherrell Puller office.  Essential hypertension Well controlled. No changes in current management.   PAD (peripheral artery disease) (HCC) Peripheral pulses are present. He will hold Plavix and Aspirin for a week  and resume after surgery.  Chronic obstructive pulmonary disease, unspecified COPD type ([facility]) Asymptomatic. Continue Albuterol inh as needed.  Open wound of foot, unspecified laterality, subsequent encounter Healing well. No signs of infection. Continue monitoring for signs of infection and appropriate foot care. Instructed about warning signs.  Need for 23-polyvalent pneumococcal polysaccharide vaccine -     Pneumococcal polysaccharide vaccine 23-valent greater than or equal to 2yo subcutaneous/IM  Cervical spondylosis with radiculopathy Problem getting worse. He is hoping surgery improves his quality of life.    Return in about 1 year (around 01/11/2020), or if symptoms worsen or fail to improve, for f/u,CPE..    -Mr. [name] was advised to return sooner than planned today if new concerns arise.       [name]. [name], MD  [facility]. [place] office.

## 2019-01-11 NOTE — Patient Instructions (Addendum)
A few things to remember from today's visit:   Preoperative clearance  Essential hypertension  PAD (peripheral artery disease) (HCC)  Chronic obstructive pulmonary disease, unspecified COPD type ([place])  Open wound of foot, unspecified laterality, subsequent encounter  Continue monitoring wound for signs of infection. Keep area moist. Avoid trauma, do not walk without shoes. Good luck with your neck surgery.  Please be sure medication list is accurate. If a new problem present, please set up appointment sooner than planned today.

## 2019-01-12 ENCOUNTER — Telehealth: Payer: Self-pay

## 2019-01-12 NOTE — Telephone Encounter (Signed)
Pt surgical clearance forms and last office note are complete and have been sent to [facility]. Fax confirmation received

## 2019-01-12 NOTE — Telephone Encounter (Signed)
Pt called the  Office back verbalized understanding of Dr [name] instructions to stop Fish oil and OTC medications until after surgery

## 2019-01-12 NOTE — Telephone Encounter (Signed)
LVM on pt voicemail with details of Dr [name] instructions to stop OTC medications until after surgery, advised pt to call the office back with any quetsions

## 2019-01-12 NOTE — Telephone Encounter (Signed)
-----   Message from [name] [name], MD sent at 01/11/2019  9:32 PM EDT ----- Regarding: Clearance Note signed. Please call pt and instruct to stop fish oil and all OTC supplements,he can resume them after surgery.  Thanks, BJ

## 2019-01-18 DIAGNOSIS — M50122 Cervical disc disorder at C5-C6 level with radiculopathy: Secondary | ICD-10-CM | POA: Diagnosis not present

## 2019-01-18 DIAGNOSIS — M50121 Cervical disc disorder at C4-C5 level with radiculopathy: Secondary | ICD-10-CM | POA: Diagnosis not present

## 2019-01-18 DIAGNOSIS — M4722 Other spondylosis with radiculopathy, cervical region: Secondary | ICD-10-CM | POA: Diagnosis not present

## 2019-01-18 DIAGNOSIS — M4312 Spondylolisthesis, cervical region: Secondary | ICD-10-CM | POA: Diagnosis not present

## 2019-01-18 DIAGNOSIS — M4712 Other spondylosis with myelopathy, cervical region: Secondary | ICD-10-CM | POA: Diagnosis not present

## 2019-03-30 ENCOUNTER — Ambulatory Visit (HOSPITAL_COMMUNITY)
Admission: RE | Admit: 2019-03-30 | Discharge: 2019-03-30 | Disposition: A | Discharge status: Home / Self Care | Payer: Medicare Other | Source: Ambulatory Visit | Attending: Cardiovascular Disease | Admitting: Cardiovascular Disease

## 2019-03-30 ENCOUNTER — Other Ambulatory Visit: Payer: Self-pay

## 2019-03-30 ENCOUNTER — Other Ambulatory Visit (HOSPITAL_COMMUNITY): Payer: Self-pay | Admitting: Cardiovascular Disease

## 2019-03-30 DIAGNOSIS — I739 Peripheral vascular disease, unspecified: Secondary | ICD-10-CM

## 2019-03-30 DIAGNOSIS — Z95820 Peripheral vascular angioplasty status with implants and grafts: Secondary | ICD-10-CM

## 2019-04-01 ENCOUNTER — Other Ambulatory Visit: Payer: Self-pay | Admitting: *Deleted

## 2019-04-01 DIAGNOSIS — I739 Peripheral vascular disease, unspecified: Secondary | ICD-10-CM

## 2019-04-06 ENCOUNTER — Ambulatory Visit (INDEPENDENT_AMBULATORY_CARE_PROVIDER_SITE_OTHER): Payer: Medicare Other | Admitting: Cardiovascular Disease

## 2019-04-06 ENCOUNTER — Other Ambulatory Visit: Payer: Self-pay

## 2019-04-06 ENCOUNTER — Encounter: Payer: Self-pay | Admitting: Cardiovascular Disease

## 2019-04-06 VITALS — BP 134/76 | HR 65 | Temp 96.8°F | Ht 74.0 in | Wt 189.0 lb

## 2019-04-06 DIAGNOSIS — I10 Essential (primary) hypertension: Secondary | ICD-10-CM

## 2019-04-06 DIAGNOSIS — E785 Hyperlipidemia, unspecified: Secondary | ICD-10-CM

## 2019-04-06 DIAGNOSIS — I739 Peripheral vascular disease, unspecified: Secondary | ICD-10-CM | POA: Diagnosis not present

## 2019-04-06 NOTE — Progress Notes (Signed)
Cardiology Office Note   Date:  04/06/2019   ID:  David David, DOB May 25, 1950, MRN [id number]  PCP:  [name], [name], MD  Cardiologist:   Kathlyn Sacramento, MD   No chief complaint on file.     History of Present Illness: David David is a 69 y.o. male who presents for a follow-up visit regarding peripheral arterial disease. The patient has lower extremity peripheral arterial disease with recurrent atheroembolic events to the left foot due to distal left popliteal artery stenosis with previous Cypher drug-eluting stent placement which was used in order to treat an ulcerated plaque with distal embolization.  He was seen in 11/2012  for a painless rash involving the left great toe and medial foot, with typical appearance of an athero embolic event. Echocardiogram was unremarkable.  CTA of the chest abdomen and pelvis with runoff to the feet demonstrated scattered atheromatous the aorta but no significant findings.  The left tibioperoneal stent had greater than 70% stenosis. ABI was normal but there was significant dampening in pressure noted in the big toe. I proceeded with angiography which confirmed this and also showed an occluded dorsalis pedis likely from embolization. I performed balloon angioplasty with an Angioscore balloon to both ostial anterior tibial and ostial tibioperoneal trunk.  He had cardiac catheterization in May of 2016 which showed moderate left circumflex stenosis and no evidence of obstructive disease.  He had dizziness and presyncope in the past and thought to have a TIA but neurologic evaluation was overall unremarkable.  He reports no leg claudication.  He has been dealing with skin lesions on both legs thought to be due to contact dermatitis.  He reports having extensive evaluation for this and was treated for cellulitis as well.  In addition, he walked barefoot in the fall and developed blisters on the bottom of both feet.  Ultimately the blister changed to  an ulceration which healed on the right side but still healing on the left side.  He is not smoking at the present time.   Past Medical History:  Diagnosis Date  . Alcoholism in family   . Anxiety   . Arthritis   . Depressive disorder, not elsewhere classified   . Diverticulosis of colon (without mention of hemorrhage)   . Dysfunction of eustachian tube   . Exposure to STD   . Family history of diabetes mellitus   . Family history of psychiatric condition    family Hx of suicide   . GERD (gastroesophageal reflux disease)   . Impotence of organic origin    erectile dysfunction  . Lipoprotein deficiencies   . Other diseases of lung, not elsewhere classified    pulmonary nodule  . Peripheral vascular disease, unspecified ([place])    lower extremity peripheral artery disease  . Personal history of colonic polyps   . Routine general medical examination at a health care facility   . Seizures ([place])   . Sprain of neck    Hx  . Thyroid disease   . Tobacco use disorder   . Unspecified hypertensive kidney disease with chronic kidney disease stage I through stage IV, or unspecified(403.90)    cholesterol embolization syndrome    Past Surgical History:  Procedure Laterality Date  . angiography     bilateral lower extremity angiography, stenting of the left.   Marland Kitchen CARDIAC CATHETERIZATION N/A 07/19/2014   Procedure: Left Heart Cath and Coronary Angiography;  Surgeon: Leonie Man, MD;  Location: [place]  CUPID;  Service: Cardiovascular;  Laterality: N/A;  . CATARACT EXTRACTION Bilateral   . EYE SURGERY  2016   blown macular  . KNEE ARTHROSCOPY Left    x 2  . LOWER EXTREMITY ANGIOGRAM N/A 11/04/2012   Procedure: LOWER EXTREMITY ANGIOGRAM;  Surgeon: Wellington Hampshire, MD;  Location: [place] CATH LAB;  Service: Cardiovascular;  Laterality: N/A;  . POPLITEAL ARTERY ANGIOPLASTY     didnt specify angioplasty or stent  . SPERMATOCELECTOMY Left    testicular cyst  . VASECTOMY        Current Outpatient Medications  Medication Sig Dispense Refill  . albuterol (PROVENTIL HFA;VENTOLIN HFA) 108 (90 Base) MCG/ACT inhaler Inhale 2 puffs into the lungs as needed for wheezing or shortness of breath.    . alfuzosin (UROXATRAL) 10 MG 24 hr tablet Take 10 mg by mouth at bedtime.  11  . amLODipine (NORVASC) 10 MG tablet Take 5 mg by mouth daily.      Marland Kitchen ammonium lactate (LAC-HYDRIN) 12 % lotion Apply 1 application topically 2 (two) times daily as needed for dry skin.    Marland Kitchen aspirin EC 81 MG tablet Take 1 tablet (81 mg total) by mouth daily. 90 tablet 3  . cetirizine (ZYRTEC) 10 MG tablet Take 1 tablet (10 mg total) by mouth daily. 30 tablet 0  . clopidogrel (PLAVIX) 75 MG tablet Take 1 tablet (75 mg total) by mouth daily. 30 tablet 3  . cyclobenzaprine (FLEXERIL) 10 MG tablet Take 1 tablet (10 mg total) by mouth at bedtime. 30 tablet   . escitalopram (LEXAPRO) 10 MG tablet Take 30 mg by mouth every morning.     . gabapentin (NEURONTIN) 300 MG capsule Take 600 mg by mouth 2 (two) times daily.    . Gauze Pads & Dressings (GAUZE DRESSING) 4"X4" PADS Use as instructed for wound care. 30 each 1  . hydrOXYzine (ATARAX) 50 MG tablet Take 50 mg by mouth 2 (two) times daily.     . mirtazapine (REMERON) 15 MG tablet Take 1 tablet (15 mg total) by mouth at bedtime. 30 tablet 2  . Multiple Vitamin (MULTI-VITAMIN) tablet Take by mouth. theragran    . Omega-3 Fatty Acids (FISH OIL) 1000 MG CAPS Take 1,000 mg by mouth 2 (two) times daily.     Marland Kitchen omeprazole (PRILOSEC) 40 MG capsule Take 40 mg by mouth daily.    . pravastatin (PRAVACHOL) 40 MG tablet Take 40 mg by mouth at bedtime.    . sodium chloride 0.9 % nebulizer solution Take 3 mLs by nebulization as needed for wheezing. 90 mL 12  . zolpidem (AMBIEN) 10 MG tablet Take 5 mg by mouth at bedtime.     No current facility-administered medications for this visit.    Allergies:   Tape, Neomycin-bacitracin zn-polymyx, Niacin, and Paroxetine     Social History:  The patient  reports that he has quit smoking. His smoking use included cigarettes. He started smoking about 2 years ago. He has a 50.00 pack-year smoking history. He has never used smokeless tobacco. He reports current alcohol use. He reports current drug use. Drug: Marijuana.   Family History:  The patient's family history includes Alzheimer's disease in his brother and sister; CAD in his father; Congestive Heart Failure in his mother; Diabetes in his brother; Lung cancer in his brother.    ROS:  Please see the history of present illness.   Otherwise, review of systems are positive for none.   All other systems are reviewed and  negative.    PHYSICAL EXAM: VS:  BP 134/76   Pulse 65   Temp (!) 96.8 F (36 C)   Ht 6\' 2"  (1.88 m)   Wt 189 lb (85.7 kg)   SpO2 99%   BMI 24.27 kg/m  , BMI Body mass index is 24.27 kg/m. GEN: Well nourished, well developed, in no acute distress  HEENT: normal  Neck: no JVD, carotid bruits, or masses Cardiac: RRR; no murmurs, rubs, or gallops,no edema  Respiratory:  clear to auscultation bilaterally, normal work of breathing GI: soft, nontender, nondistended, + BS MS: no deformity or atrophy  Skin: warm and dry, no rash Neuro:  Strength and sensation are intact Psych: euthymic mood, full affect Vascular: Posterior tibial and dorsalis pedis is palpable bilaterally. He does have a small ulceration on the bottom of her left foot.  EKG:  EKG is ordered today. The ekg ordered today demonstrates normal sinus rhythm with nonspecific T wave changes.   Recent Labs: 12/11/2018: BUN 14; Creatinine, Ser 1.03; Hemoglobin 13.0; Platelets 308.0; Potassium 4.6; Sodium 142    Lipid Panel    Component Value Date/Time   CHOL 126 04/17/2017 0344   TRIG 150 (H) 04/17/2017 0344   HDL 36 (L) 04/17/2017 0344   CHOLHDL 3.5 04/17/2017 0344   VLDL 30 04/17/2017 0344   LDLCALC 60 04/17/2017 0344      Wt Readings from Last 3 Encounters:   04/06/19 189 lb (85.7 kg)  01/11/19 183 lb (83 kg)  12/11/18 183 lb 2 oz (83.1 kg)      No flowsheet data found.    ASSESSMENT AND PLAN:  1.  Peripheral arterial disease: Recent evaluation showed normal ABI and patent stents in the left leg although he does have some moderate restenosis.  He does have palpable distal pulses.  Continue medical therapy and indefinite dual antiplatelet therapy. He does have a small ulceration on the bottom of the left foot after he developed a blister from walking barefoot.  That seems to be healing.  If there is worsening call if this does not heal completely in the next few months, I asked him to notify me.  2. Tobacco use:  He is not smoking at the present time.  3. Essential hypertension: Continue same medications.  4. Hyperlipidemia: Continue treatment with pravastatin with a target LDL of less than 70.    Most recent LDL was 60.    Disposition:   FU with me in 1 year  Signed,  Kathlyn Sacramento, MD  04/06/2019 10:06 AM    [place]

## 2019-04-06 NOTE — Patient Instructions (Signed)
Medication Instructions:  No changes *If you need a refill on your cardiac medications before your next appointment, please call your pharmacy*  Lab Work: None ordered If you have labs (blood work) drawn today and your tests are completely normal, you will receive your results only by: Marland Kitchen MyChart Message (if you have MyChart) OR . A paper copy in the mail If you have any lab test that is abnormal or we need to change your treatment, we will call you to review the results.  Testing/Procedures: Your physician has requested that you have a lower extremity arterial duplex in one year. During this test, ultrasound is used to evaluate arterial blood flow in the legs. Allow one hour for this exam. There are no restrictions or special instructions. This will take place at [place], [address].  Your physician has requested that you have an ankle brachial index (ABI) in one year. During this test an ultrasound and blood pressure cuff are used to evaluate the arteries that supply the arms and legs with blood. Allow thirty minutes for this exam. There are no restrictions or special instructions. This will take place at [place], [address].    Follow-Up: At [facility], you and your health needs are our priority.  As part of our continuing mission to provide you with exceptional heart care, we have created designated Provider Care Teams.  These Care Teams include your primary Cardiologist (physician) and Advanced Practice Providers (APPs -  Physician Assistants and Nurse Practitioners) who all work together to provide you with the care you need, when you need it.  Your next appointment:   12 month(s)  The format for your next appointment:   In Person  Provider:   Kathlyn Sacramento, MD

## 2019-06-03 ENCOUNTER — Other Ambulatory Visit: Payer: Self-pay

## 2019-06-03 ENCOUNTER — Encounter (HOSPITAL_COMMUNITY): Payer: Self-pay

## 2019-06-03 ENCOUNTER — Emergency Department (HOSPITAL_COMMUNITY): Payer: Medicare Other

## 2019-06-03 ENCOUNTER — Emergency Department (HOSPITAL_COMMUNITY)
Admission: EM | Admit: 2019-06-03 | Discharge: 2019-06-04 | Disposition: A | Discharge status: Home / Self Care | Payer: Medicare Other | Attending: Emergency Medicine | Admitting: Emergency Medicine

## 2019-06-03 DIAGNOSIS — Z87891 Personal history of nicotine dependence: Secondary | ICD-10-CM | POA: Diagnosis not present

## 2019-06-03 DIAGNOSIS — I739 Peripheral vascular disease, unspecified: Secondary | ICD-10-CM | POA: Insufficient documentation

## 2019-06-03 DIAGNOSIS — E039 Hypothyroidism, unspecified: Secondary | ICD-10-CM | POA: Insufficient documentation

## 2019-06-03 DIAGNOSIS — Z7982 Long term (current) use of aspirin: Secondary | ICD-10-CM | POA: Insufficient documentation

## 2019-06-03 DIAGNOSIS — Z79899 Other long term (current) drug therapy: Secondary | ICD-10-CM | POA: Insufficient documentation

## 2019-06-03 DIAGNOSIS — Z7901 Long term (current) use of anticoagulants: Secondary | ICD-10-CM | POA: Insufficient documentation

## 2019-06-03 DIAGNOSIS — J449 Chronic obstructive pulmonary disease, unspecified: Secondary | ICD-10-CM | POA: Diagnosis not present

## 2019-06-03 DIAGNOSIS — R739 Hyperglycemia, unspecified: Secondary | ICD-10-CM

## 2019-06-03 DIAGNOSIS — M7989 Other specified soft tissue disorders: Secondary | ICD-10-CM | POA: Diagnosis not present

## 2019-06-03 DIAGNOSIS — G9009 Other idiopathic peripheral autonomic neuropathy: Secondary | ICD-10-CM | POA: Insufficient documentation

## 2019-06-03 DIAGNOSIS — L03116 Cellulitis of left lower limb: Secondary | ICD-10-CM

## 2019-06-03 DIAGNOSIS — E1165 Type 2 diabetes mellitus with hyperglycemia: Secondary | ICD-10-CM | POA: Diagnosis not present

## 2019-06-03 DIAGNOSIS — Z8673 Personal history of transient ischemic attack (TIA), and cerebral infarction without residual deficits: Secondary | ICD-10-CM | POA: Diagnosis not present

## 2019-06-03 DIAGNOSIS — I10 Essential (primary) hypertension: Secondary | ICD-10-CM | POA: Insufficient documentation

## 2019-06-03 LAB — CBC WITH DIFFERENTIAL/PLATELET
Abs Immature Granulocytes: 0.05 10*3/uL (ref 0.00–0.07)
Basophils Absolute: 0 10*3/uL (ref 0.0–0.1)
Basophils Relative: 0 %
Eosinophils Absolute: 0 10*3/uL (ref 0.0–0.5)
Eosinophils Relative: 0 %
HCT: 40.6 % (ref 39.0–52.0)
Hemoglobin: 13.3 g/dL (ref 13.0–17.0)
Immature Granulocytes: 1 %
Lymphocytes Relative: 8 %
Lymphs Abs: 0.8 10*3/uL (ref 0.7–4.0)
MCH: 28.8 pg (ref 26.0–34.0)
MCHC: 32.8 g/dL (ref 30.0–36.0)
MCV: 87.9 fL (ref 80.0–100.0)
Monocytes Absolute: 0.5 10*3/uL (ref 0.1–1.0)
Monocytes Relative: 5 %
Neutro Abs: 8.6 10*3/uL — ABNORMAL HIGH (ref 1.7–7.7)
Neutrophils Relative %: 86 %
Platelets: 110 10*3/uL — ABNORMAL LOW (ref 150–400)
RBC: 4.62 MIL/uL (ref 4.22–5.81)
RDW: 15.1 % (ref 11.5–15.5)
WBC: 10 10*3/uL (ref 4.0–10.5)
nRBC: 0 % (ref 0.0–0.2)

## 2019-06-03 LAB — BASIC METABOLIC PANEL
Anion gap: 10 (ref 5–15)
BUN: 13 mg/dL (ref 8–23)
CO2: 21 mmol/L — ABNORMAL LOW (ref 22–32)
Calcium: 8.8 mg/dL — ABNORMAL LOW (ref 8.9–10.3)
Chloride: 103 mmol/L (ref 98–111)
Creatinine, Ser: 1.18 mg/dL (ref 0.61–1.24)
GFR calc Af Amer: >60 mL/min (ref >60)
GFR calc non Af Amer: >60 mL/min (ref >60)
Glucose, Bld: 181 mg/dL — ABNORMAL HIGH (ref 70–99)
Potassium: 3.2 mmol/L — ABNORMAL LOW (ref 3.5–5.1)
Sodium: 134 mmol/L — ABNORMAL LOW (ref 135–145)

## 2019-06-03 MED ORDER — CEFAZOLIN SODIUM-DEXTROSE 1-4 GM/50ML-% IV SOLN
1.0000 g | Freq: Once | INTRAVENOUS | Status: AC
  Administered 2019-06-03: 1 g via INTRAVENOUS
  Filled 2019-06-03 (×2): qty 50

## 2019-06-03 NOTE — ED Provider Notes (Signed)
This patient is a 69 year old male, he is not a diabetic but does have hypertension and a history of poor circulation requiring a stent in his left lower extremity.  He has a history of a poorly healing ulcer to the bottom of his left foot and has some intermittent cellulitis.  Most recently he had developed some increasing pain redness and a fever as high as 101.9 earlier today.  He states he did take some Tylenol, the fever started yesterday, he reports having no coughing or shortness of breath or urinary symptoms, on exam the patient does have a swollen left foot compared to the right side.  There is redness and warmth extending on the dorsum of the foot but not above the ankle.  There is a area of thickened skin with a partially healed ulcer to the bottom of the foot, there is no drainage or foul smell.  He has capillary refill to the toes.  He is not tachycardic, lungs are clear, does not appear tense distress, checking labs and x-ray, start antibiotics, the patient appears well enough to go home as long as his labs look reassuring.  Medical screening examination/treatment/procedure(s) were conducted as a shared visit with non-physician practitioner(s) and myself.  I personally evaluated the patient during the encounter.  Clinical Impression:   Final diagnoses:  Cellulitis of left lower extremity  Hyperglycemia         Noemi Chapel, MD 06/09/19 [phone]
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
## 2019-06-03 NOTE — ED Triage Notes (Signed)
Pt reports ongoing flare up of cellulitis to left foot, dorsal aspect, pt is followed by [facility] and has had problems with left leg and foot for a few years. Pt also has what appears to be an ulcer to the plantar aspect of left foot (distal to 3rd metatarsal)- skin surrounding is boggy to touch, with wound bed covered with eschar and callused. Pt also reports intermittent fever since yesterday, controlled with tylenol. Pt reports high temp at home was 101.

## 2019-06-04 ENCOUNTER — Telehealth: Payer: Self-pay | Admitting: Family Medicine

## 2019-06-04 DIAGNOSIS — L03119 Cellulitis of unspecified part of limb: Secondary | ICD-10-CM

## 2019-06-04 MED ORDER — CEPHALEXIN 500 MG PO CAPS: 500.0000 mg | ORAL_CAPSULE | Freq: Four times a day (QID) | ORAL | 0 refills | Status: DC

## 2019-06-04 MED ORDER — DOXYCYCLINE HYCLATE 100 MG PO CAPS: 100.0000 mg | ORAL_CAPSULE | Freq: Two times a day (BID) | ORAL | 0 refills | Status: DC

## 2019-06-04 NOTE — Telephone Encounter (Signed)
Pt is requesting a referral to the [facility]. Pt is having issues with the bottom of his feet. Thanks

## 2019-06-04 NOTE — Telephone Encounter (Signed)
Referral placed, they will contact pt to schedule appt.

## 2019-06-04 NOTE — Telephone Encounter (Signed)
It is okay to arrange appointment with wound clinic. Thanks, BJ

## 2019-06-04 NOTE — Discharge Instructions (Addendum)
Start the antibiotics prescribed tomorrow.  Your blood glucose level is elevated tonight at 180 (normal range is 70-110).  You will need to have this test repeated when you see your primary MD.  I recommend elevation of your foot and warm (not hot) compresses or heating pad for 20 minutes several times daily.  Call your primary MD for a recheck of this infection on Monday, but get rechecked sooner for any worsening symptoms (spreading redness, pain, worsening swelling).

## 2019-06-04 NOTE — Telephone Encounter (Signed)
Okay for referral? Pt seen in ED yesterday.

## 2019-06-07 ENCOUNTER — Telehealth: Payer: Self-pay

## 2019-06-07 NOTE — Telephone Encounter (Signed)
Pt's daughter, Joelene Millin, left VM on Saturday reporting pt was having fever and vomiting.  The triage number at this clinic, [facility], was given by after hours nurse line, because Dr. Einar Pheasant was to have virtual visits on 3/20, Saturday, but it was cancelled due to no one on the schedule. So no one was in the office to retrieve the messages.  Contacted pt's daughter who reports pt was feverish and vomiting on Saturday and she called the after hours nurse line. They gave her the number to this clinic's triage line. She reports pt was given abx in the ER on 3/18 for cellulitis  and reports pt started feeling better on 3/21 and was afebrile and denies anymore vomiting. She reports pt is eating now without vomiting. She is concerned of the wound on pt's foot and requested wound clinic referral. Advised the referral was placed on 3/18 and they will contact her to set up apt. Advised if there is not contact in the next couple of days to contact pt's clinic.  She is also concerned pt was hyperglycemic in the ER and she thinks further testing may be needed. Advised this msg would be forwarded to PCP and if any further concerns or symptoms worsen or pt has any new symptoms to contact the PCP. Joelene Millin verbalized understanding.

## 2019-06-08 LAB — CULTURE, BLOOD (ROUTINE X 2)
Culture: NO GROWTH
Culture: NO GROWTH
Special Requests: ADEQUATE
Special Requests: ADEQUATE

## 2019-06-09 ENCOUNTER — Other Ambulatory Visit: Payer: Self-pay

## 2019-06-09 ENCOUNTER — Encounter (HOSPITAL_BASED_OUTPATIENT_CLINIC_OR_DEPARTMENT_OTHER): Payer: Medicare Other | Attending: Internal Medicine | Admitting: Physician Assistant

## 2019-06-09 DIAGNOSIS — L97522 Non-pressure chronic ulcer of other part of left foot with fat layer exposed: Secondary | ICD-10-CM | POA: Diagnosis not present

## 2019-06-09 DIAGNOSIS — R739 Hyperglycemia, unspecified: Secondary | ICD-10-CM | POA: Insufficient documentation

## 2019-06-09 DIAGNOSIS — G40909 Epilepsy, unspecified, not intractable, without status epilepticus: Secondary | ICD-10-CM | POA: Diagnosis not present

## 2019-06-09 DIAGNOSIS — J449 Chronic obstructive pulmonary disease, unspecified: Secondary | ICD-10-CM | POA: Insufficient documentation

## 2019-06-09 DIAGNOSIS — G9009 Other idiopathic peripheral autonomic neuropathy: Secondary | ICD-10-CM | POA: Diagnosis not present

## 2019-06-09 DIAGNOSIS — I10 Essential (primary) hypertension: Secondary | ICD-10-CM | POA: Insufficient documentation

## 2019-06-09 DIAGNOSIS — I7389 Other specified peripheral vascular diseases: Secondary | ICD-10-CM | POA: Diagnosis not present

## 2019-06-09 DIAGNOSIS — M1990 Unspecified osteoarthritis, unspecified site: Secondary | ICD-10-CM | POA: Diagnosis not present

## 2019-06-15 ENCOUNTER — Encounter (HOSPITAL_BASED_OUTPATIENT_CLINIC_OR_DEPARTMENT_OTHER): Payer: Medicare Other | Admitting: Internal Medicine

## 2019-06-16 ENCOUNTER — Other Ambulatory Visit: Payer: Self-pay

## 2019-06-16 ENCOUNTER — Encounter (HOSPITAL_BASED_OUTPATIENT_CLINIC_OR_DEPARTMENT_OTHER): Payer: Medicare Other | Admitting: Physician Assistant

## 2019-06-16 DIAGNOSIS — G9009 Other idiopathic peripheral autonomic neuropathy: Secondary | ICD-10-CM | POA: Diagnosis not present

## 2019-06-16 DIAGNOSIS — L97522 Non-pressure chronic ulcer of other part of left foot with fat layer exposed: Secondary | ICD-10-CM | POA: Diagnosis not present

## 2019-06-16 DIAGNOSIS — M1990 Unspecified osteoarthritis, unspecified site: Secondary | ICD-10-CM | POA: Diagnosis not present

## 2019-06-16 DIAGNOSIS — G629 Polyneuropathy, unspecified: Secondary | ICD-10-CM | POA: Diagnosis not present

## 2019-06-16 DIAGNOSIS — R739 Hyperglycemia, unspecified: Secondary | ICD-10-CM | POA: Diagnosis not present

## 2019-06-16 DIAGNOSIS — I7389 Other specified peripheral vascular diseases: Secondary | ICD-10-CM | POA: Diagnosis not present

## 2019-06-16 DIAGNOSIS — J449 Chronic obstructive pulmonary disease, unspecified: Secondary | ICD-10-CM | POA: Diagnosis not present

## 2019-06-16 DIAGNOSIS — I10 Essential (primary) hypertension: Secondary | ICD-10-CM | POA: Diagnosis not present

## 2019-06-16 NOTE — Progress Notes (Addendum)
David David, David David ([id number]) Visit Report for 06/16/2019 Chief Complaint Document Details Patient Name: Date of Service: David David, David David 06/16/2019 8:45 AM Medical Record [id number] Patient Account Number: [id number] Date of Birth/Sex: Treating RN: 05-04-1950 (69 y.o. [name] Primary Care Provider: [name], [name] Other Clinician: Referring Provider: Treating Provider/Extender:Stone III, Darcus Edds [name], [name] in Treatment: 1 Information Obtained from: Patient Chief Complaint Left plantar foot ulcer Electronic Signature(s) Signed: 06/16/2019 9:01:28 AM By: Worthy Keeler PA-C Entered By: Worthy Keeler on 06/16/2019 09:01:27 -------------------------------------------------------------------------------- Debridement Details Patient Name: Date of Service: David David, David David 06/16/2019 8:45 AM Medical Record [id number] Patient Account Number: [id number] Date of Birth/Sex: 08/06/1950 (69 y.o. M) Treating RN: Baruch Gouty Primary Care Provider: [name], [name] Other Clinician: Referring Provider: Treating Provider/Extender:Stone III, Kristena Wilhelmi [name], [name] in Treatment: 1 Debridement Performed for Wound #1 Left,Plantar Foot Assessment: Performed By: Physician Worthy Keeler, PA Debridement Type: Debridement Level of Consciousness (Pre- Awake and Alert procedure): Pre-procedure Verification/Time Out Taken: Yes - 09:05 Start Time: 09:06 Pain Control: Other : benzocaine 20% spray Total Area Debrided (L x W): 0.9 (cm) x 1.9 (cm) = 1.71 (cm) Tissue and other material Viable, Non-Viable, Callus, Slough, Subcutaneous, Skin: Epidermis, Slough debrided: Level: Skin/Subcutaneous Tissue Debridement Description: Excisional Instrument: Curette Bleeding: Minimum Hemostasis Achieved: Pressure End Time: 09:09 Procedural Pain: 0 Post Procedural Pain: 0 Response to Treatment: Procedure was tolerated well Level of Consciousness Awake and  Alert (Post-procedure): Post Debridement Measurements of Total Wound Length: (cm) 0.9 Width: (cm) 1.9 Depth: (cm) 0.2 Volume: (cm) 0.269 Character of Wound/Ulcer Post Improved Debridement: Post Procedure Diagnosis Same as Pre-procedure Electronic Signature(s) Signed: 06/16/2019 6:50:57 PM By: Baruch Gouty RN, BSN Signed: 06/16/2019 10:08:08 PM By: Worthy Keeler PA-C Entered By: Baruch Gouty on 06/16/2019 09:08:54 -------------------------------------------------------------------------------- HPI Details Patient Name: Date of Service: David David 06/16/2019 8:45 AM Medical Record [id number] Patient Account Number: [id number] Date of Birth/Sex: Treating RN: 10-06-1950 (69 y.o. [name] Primary Care Provider: [name], [name] Other Clinician: Referring Provider: Treating Provider/Extender:Stone III, Tais Koestner [name], [name] in Treatment: 1 History of Present Illness HPI Description: 06/09/2019 upon evaluation today patient presents for initial inspection here in the clinic concerning issues he has been having with his plantar foot distally at right at the base of his toes centrally. He tells me this started with a blister back in September of last year he has had some issues following and more recently he has had trouble with blistering at the base of his toes. Finally a week ago it got to the point that he decided to going to be seen. He was seen at [facility] last week he was given Keflex and doxycycline I recommended to follow-up with wound care. Fortunately there is no signs of active infection at this time. No fevers, chills, nausea, vomiting, or diarrhea. The patient has not confirmed diabetic although he did have an elevated blood glucose level in the hospital when he was seen last week. He does have neuropathy considered to be idiopathic at this point. He also has peripheral vascular disease has had a previous stent to the left lower  extremity he also had recent ABIs which measured at 1.23 2021. Subsequently he does have COPD and hypertension as well. He notes that the initial blistering actually occurred as a result of the patient walking outside without shoes on in September when it was much too hot he could not feel and incidentally burned his feet. 06/16/2019 upon evaluation today patient appears  to be doing well with regard to his foot ulcer. He has been tolerating the dressing changes with the alginate. Things do seem to be drying up which is great news. Fortunately there is no signs of active infection at this time. No fevers, chills, nausea, vomiting, or diarrhea. Electronic Signature(s) Signed: 06/16/2019 9:11:54 AM By: Worthy Keeler PA-C Entered By: Worthy Keeler on 06/16/2019 09:11:53 -------------------------------------------------------------------------------- Physical Exam Details Patient Name: Date of Service: David David, David David 06/16/2019 8:45 AM Medical Record [id number] Patient Account Number: [id number] Date of Birth/Sex: Treating RN: 1950-11-26 (69 y.o. [name] Primary Care Provider: [name], [name] Other Clinician: Referring Provider: Treating Provider/Extender:Stone III, Prue Lingenfelter [name], [name] in Treatment: 1 Constitutional Well-nourished and well-hydrated in no acute distress. Respiratory normal breathing without difficulty. Psychiatric this patient is able to make decisions and demonstrates good insight into disease process. Alert and Oriented x 3. pleasant and cooperative. Notes Upon inspection patient's wound bed actually did show some slough minimally on the surface of the wound which he tolerated today without complication as far as debridement is concerned cleaning this away. He had no infection significantly noted and no pain at all. I was able to clean away the slough from the surface of the wound down to good granulation tissue. Overall he is having much less  drainage and I feel like this is improving quite well. I do believe he may be ready to switch over to a silver collagen dressing next week I would like to continue with the alginate for 1 more week. Electronic Signature(s) Signed: 06/16/2019 9:12:35 AM By: Worthy Keeler PA-C Entered By: Worthy Keeler on 06/16/2019 09:12:34 -------------------------------------------------------------------------------- Physician Orders Details Patient Name: Date of Service: David David, David David 06/16/2019 8:45 AM Medical Record [id number] Patient Account Number: [id number] Date of Birth/Sex: Treating RN: 1950-06-02 (69 y.o. [name] Primary Care Provider: [name], [name] Other Clinician: Referring Provider: Treating Provider/Extender:Stone III, Carlon Chaloux [name], [name] in Treatment: 1 Verbal / Phone Orders: No Diagnosis Coding ICD-10 Coding Code Description G90.09 Other idiopathic peripheral autonomic neuropathy L97.522 Non-pressure chronic ulcer of other part of left foot with fat layer exposed R73.9 Hyperglycemia, unspecified I73.89 Other specified peripheral vascular diseases J44.9 Chronic obstructive pulmonary disease, unspecified I10 Essential (primary) hypertension Follow-up Appointments Return Appointment in 1 week. Dressing Change Frequency Wound #1 Left,Plantar Foot Change dressing every day. Wound Cleansing Wound #1 Left,Plantar Foot May shower and wash wound with soap and water. Primary Wound Dressing Wound #1 Left,Plantar Foot Calcium Alginate with Silver Secondary Dressing Wound #1 Left,Plantar Foot Kerlix/Rolled Gauze Dry Gauze Off-Loading Open toe surgical shoe to: - left foot Electronic Signature(s) Signed: 06/16/2019 6:50:57 PM By: Baruch Gouty RN, BSN Signed: 06/16/2019 10:08:08 PM By: Worthy Keeler PA-C Entered By: Baruch Gouty on 06/16/2019 09:09:21 -------------------------------------------------------------------------------- Problem List  Details Patient Name: Date of Service: David David, David David 06/16/2019 8:45 AM Medical Record [id number] Patient Account Number: [id number] Date of Birth/Sex: Treating RN: 1950/09/18 (69 y.o. [name] Primary Care Provider: [name], [name] Other Clinician: Referring Provider: Treating Provider/Extender:Stone III, Krystian Younglove [name], [name] in Treatment: 1 Active Problems ICD-10 Evaluated Encounter Evaluated Encounter Code Description Active Date Today Diagnosis G90.09 Other idiopathic peripheral autonomic neuropathy 06/09/2019 No Yes L97.522 Non-pressure chronic ulcer of other part of left foot 06/09/2019 No Yes with fat layer exposed R73.9 Hyperglycemia, unspecified 06/09/2019 No Yes I73.89 Other specified peripheral vascular diseases 06/09/2019 No Yes J44.9 Chronic obstructive pulmonary disease, unspecified 06/09/2019 No Yes I10 Essential (primary) hypertension 06/09/2019 No Yes Inactive  Problems Resolved Problems Electronic Signature(s) Signed: 06/16/2019 9:01:20 AM By: Worthy Keeler PA-C Entered By: Worthy Keeler on 06/16/2019 09:01:20 -------------------------------------------------------------------------------- Progress Note Details Patient Name: Date of Service: David David, David David 06/16/2019 8:45 AM Medical Record [id number] Patient Account Number: [id number] Date of Birth/Sex: Treating RN: 02/17/1951 (69 y.o. [name] Primary Care Provider: [name], [name] Other Clinician: Referring Provider: Treating Provider/Extender:Stone III, Serigne Kubicek [name], [name] in Treatment: 1 Subjective Chief Complaint Information obtained from Patient Left plantar foot ulcer History of Present Illness (HPI) 06/09/2019 upon evaluation today patient presents for initial inspection here in the clinic concerning issues he has been having with his plantar foot distally at right at the base of his toes centrally. He tells me this started with a blister back in  September of last year he has had some issues following and more recently he has had trouble with blistering at the base of his toes. Finally a week ago it got to the point that he decided to going to be seen. He was seen at [facility] last week he was given Keflex and doxycycline I recommended to follow-up with wound care. Fortunately there is no signs of active infection at this time. No fevers, chills, nausea, vomiting, or diarrhea. The patient has not confirmed diabetic although he did have an elevated blood glucose level in the hospital when he was seen last week. He does have neuropathy considered to be idiopathic at this point. He also has peripheral vascular disease has had a previous stent to the left lower extremity he also had recent ABIs which measured at 1.23 2021. Subsequently he does have COPD and hypertension as well. He notes that the initial blistering actually occurred as a result of the patient walking outside without shoes on in September when it was much too hot he could not feel and incidentally burned his feet. 06/16/2019 upon evaluation today patient appears to be doing well with regard to his foot ulcer. He has been tolerating the dressing changes with the alginate. Things do seem to be drying up which is great news. Fortunately there is no signs of active infection at this time. No fevers, chills, nausea, vomiting, or diarrhea. Objective Constitutional Well-nourished and well-hydrated in no acute distress. Vitals Time Taken: 8:43 AM, Height: 75 in, Weight: 185 lbs, BMI: 23.1, Temperature: 98.5 F, Pulse: 73 bpm, Respiratory Rate: 18 breaths/min, Blood Pressure: 139/77 mmHg. Respiratory normal breathing without difficulty. Psychiatric this patient is able to make decisions and demonstrates good insight into disease process. Alert and Oriented x 3. pleasant and cooperative. General Notes: Upon inspection patient's wound bed actually did show some slough  minimally on the surface of the wound which he tolerated today without complication as far as debridement is concerned cleaning this away. He had no infection significantly noted and no pain at all. I was able to clean away the slough from the surface of the wound down to good granulation tissue. Overall he is having much less drainage and I feel like this is improving quite well. I do believe he may be ready to switch over to a silver collagen dressing next week I would like to continue with the alginate for 1 more week. Integumentary (Hair, Skin) Wound #1 status is Open. Original cause of wound was Blister. The wound is located on the [place]. The wound measures 0.9cm length x 1.9cm width x 0.2cm depth; 1.343cm^2 area and 0.269cm^3 volume. There is Fat Layer (Subcutaneous Tissue) Exposed exposed. There is no  tunneling or undermining noted. There is a medium amount of serosanguineous drainage noted. The wound margin is flat and intact. There is large (67-100%) pink granulation within the wound bed. There is a small (1-33%) amount of necrotic tissue within the wound bed including Adherent Slough. Assessment Active Problems ICD-10 Other idiopathic peripheral autonomic neuropathy Non-pressure chronic ulcer of other part of left foot with fat layer exposed Hyperglycemia, unspecified Other specified peripheral vascular diseases Chronic obstructive pulmonary disease, unspecified Essential (primary) hypertension Procedures Wound #1 Pre-procedure diagnosis of Wound #1 is a Neuropathic Ulcer-Non Diabetic located on the [place] . There was a Excisional Skin/Subcutaneous Tissue Debridement with a total area of 1.71 sq cm performed by Worthy Keeler, PA. With the following instrument(s): Curette to remove Viable and Non-Viable tissue/material. Material removed includes Callus, Subcutaneous Tissue, Slough, and Skin: Epidermis after achieving pain control using Other (benzocaine  20% spray). No specimens were taken. A time out was conducted at 09:05, prior to the start of the procedure. A Minimum amount of bleeding was controlled with Pressure. The procedure was tolerated well with a pain level of 0 throughout and a pain level of 0 following the procedure. Post Debridement Measurements: 0.9cm length x 1.9cm width x 0.2cm depth; 0.269cm^3 volume. Character of Wound/Ulcer Post Debridement is improved. Post procedure Diagnosis Wound #1: Same as Pre-Procedure Plan Follow-up Appointments: Return Appointment in 1 week. Dressing Change Frequency: Wound #1 Left,Plantar Foot: Change dressing every day. Wound Cleansing: Wound #1 Left,Plantar Foot: May shower and wash wound with soap and water. Primary Wound Dressing: Wound #1 Left,Plantar Foot: Calcium Alginate with Silver Secondary Dressing: Wound #1 Left,Plantar Foot: Kerlix/Rolled Gauze Dry Gauze Off-Loading: Open toe surgical shoe to: - left foot 1. I would recommend currently that we go ahead and continue with the silver alginate dressing for the time being. Obviously I think that the patient may benefit from switching up to a different dressing to see if there is anything we can do to improve the overall healing although I would like to make sure that we maintain good moisture control over the next week. 2. I am also going to recommend at this point that the patient continue to offload and protect the area I do not want him moving around or walking on this excessively obviously I think this can lead to more detrimental changes as far as the wound bed is concerned. 3. I may consider a silver collagen dressing next week just depending on how things seem to be progressing. We will see patient back for reevaluation in 1 week here in the clinic. If anything worsens or changes patient will contact our office for additional recommendations. Electronic Signature(s) Signed: 06/16/2019 9:16:40 AM By: Worthy Keeler  PA-C Entered By: Worthy Keeler on 06/16/2019 09:16:39 -------------------------------------------------------------------------------- SuperBill Details Patient Name: Date of Service: KLYE, BESECKER 06/16/2019 Medical Record [id number] Patient Account Number: [id number] Date of Birth/Sex: Treating RN: 1950-08-25 (69 y.o. [name] Primary Care Provider: [name], [name] Other Clinician: Referring Provider: Treating Provider/Extender:Stone III, Jonette Wassel [name], [name] in Treatment: 1 Diagnosis Coding ICD-10 Codes Code Description G90.09 Other idiopathic peripheral autonomic neuropathy L97.522 Non-pressure chronic ulcer of other part of left foot with fat layer exposed R73.9 Hyperglycemia, unspecified I73.89 Other specified peripheral vascular diseases J44.9 Chronic obstructive pulmonary disease, unspecified I10 Essential (primary) hypertension Facility Procedures CPT4 Code Description: [id number] 11042 - DEB SUBQ TISSUE 20 SQ CM/< ICD-10 Diagnosis Description L97.522 Non-pressure chronic ulcer of other part of left foot with f Modifier:  at layer expos Quantity: 1 ed Physician Procedures CPT4 Code Description: [id number] 11042 - WC PHYS SUBQ TISS 20 SQ CM ICD-10 Diagnosis Description L97.522 Non-pressure chronic ulcer of other part of left foot with f Modifier: at layer expose Quantity: 1 d Electronic Signature(s) Signed: 06/16/2019 9:19:07 AM By: Worthy Keeler PA-C Entered By: Worthy Keeler on 06/16/2019 09:19:06

## 2019-06-16 NOTE — Progress Notes (Signed)
David David, David David ([id number]) Visit Report for 06/16/2019 Arrival Information Details Patient Name: Date of Service: David David, David David 06/16/2019 8:45 AM Medical Record [id number] Patient Account Number: [id number] Date of Birth/Sex: Treating RN: 09-17-1950 (69 y.o. Lorette Ang, Tammi Klippel Primary Care Kenyana Husak: [name], [name] Other Clinician: Referring Ivett Luebbe: Treating Jeffie Spivack/Extender:Stone III, [name], [name] in Treatment: 1 Visit Information History Since Last Visit Added or deleted any No Patient Arrived: Ambulatory medications: Arrival Time: 08:42 Any new allergies or adverse No Accompanied By: self reactions: Transfer Assistance: None Had a fall or experienced change No Patient Identification Verified: Yes in Secondary Verification Process Yes activities of daily living that may Completed: affect Patient Requires Transmission-Based No risk of falls: Precautions: Signs or symptoms of No Patient Has Alerts: Yes abuse/neglect since last visito Patient Alerts: L ABI = 1.23 Hospitalized since last visit: No (03/2019) Implantable device outside of the No clinic excluding cellular tissue based products placed in the center since last visit: Has Dressing in Place as Yes Prescribed: Has Footwear/Offloading in Place Yes as Prescribed: Left: Surgical Shoe with Pressure Relief Insole Pain Present Now: No Electronic Signature(s) Signed: 06/16/2019 5:08:19 PM By: Deon Pilling Entered By: Deon Pilling on 06/16/2019 08:44:23 -------------------------------------------------------------------------------- Clinic Level of Care Assessment Details Patient Name: Date of Service: David David, David David 06/16/2019 8:45 AM Medical Record [id number] Patient Account Number: [id number] Date of Birth/Sex: Treating RN: 05-07-1950 (69 y.o. [name] Primary Care Audrea Bolte: [name], [name] Other Clinician: Referring Gailya Tauer: Treating Dakayla Disanti/Extender:Stone III,  [name], [name] in Treatment: 1 Clinic Level of Care Assessment Items TOOL 4 Quantity Score []  - Use when only an EandM is performed on FOLLOW-UP visit 0 ASSESSMENTS - Nursing Assessment / Reassessment X - Reassessment of Co-morbidities (includes updates in patient status) 1 10 X - Reassessment of Adherence to Treatment Plan 1 5 ASSESSMENTS - Wound and Skin Assessment / Reassessment []  - Simple Wound Assessment / Reassessment - one wound 0 []  - Complex Wound Assessment / Reassessment - multiple wounds 0 []  - Dermatologic / Skin Assessment (not related to wound area) 0 ASSESSMENTS - Focused Assessment []  - Circumferential Edema Measurements - multi extremities 0 []  - Nutritional Assessment / Counseling / Intervention 0 X - Lower Extremity Assessment (monofilament, tuning fork, pulses) 1 5 []  - Peripheral Arterial Disease Assessment (using hand held doppler) 0 ASSESSMENTS - Ostomy and/or Continence Assessment and Care []  - Incontinence Assessment and Management 0 []  - Ostomy Care Assessment and Management (repouching, etc.) 0 PROCESS - Coordination of Care X - Simple Patient / Family Education for ongoing care 1 15 []  - Complex (extensive) Patient / Family Education for ongoing care 0 X - Staff obtains Programmer, systems, Records, Test Results / Process Orders 1 10 []  - Staff telephones HHA, Nursing Homes / Clarify orders / etc 0 []  - Routine Transfer to another Facility (non-emergent condition) 0 []  - Routine Hospital Admission (non-emergent condition) 0 []  - New Admissions / Biomedical engineer / Ordering NPWT, Apligraf, etc. 0 []  - Emergency Hospital Admission (emergent condition) 0 X - Simple Discharge Coordination 1 10 []  - Complex (extensive) Discharge Coordination 0 PROCESS - Special Needs []  - Pediatric / Minor Patient Management 0 []  - Isolation Patient Management 0 []  - Hearing / Language / Visual special needs 0 []  - Assessment of Community assistance (transportation,  D/C planning, etc.) 0 []  - Additional assistance / Altered mentation 0 []  - Support Surface(s) Assessment (bed, cushion, seat, etc.) 0 INTERVENTIONS - Wound Cleansing / Measurement X - Simple  Wound Cleansing - one wound 1 5 []  - Complex Wound Cleansing - multiple wounds 0 X - Wound Imaging (photographs - any number of wounds) 1 5 []  - Wound Tracing (instead of photographs) 0 X - Simple Wound Measurement - one wound 1 5 []  - Complex Wound Measurement - multiple wounds 0 INTERVENTIONS - Wound Dressings X - Small Wound Dressing one or multiple wounds 1 10 []  - Medium Wound Dressing one or multiple wounds 0 []  - Large Wound Dressing one or multiple wounds 0 X - Application of Medications - topical 1 5 []  - Application of Medications - injection 0 INTERVENTIONS - Miscellaneous []  - External ear exam 0 []  - Specimen Collection (cultures, biopsies, blood, body fluids, etc.) 0 []  - Specimen(s) / Culture(s) sent or taken to Lab for analysis 0 []  - Patient Transfer (multiple staff / Civil Service fast streamer / Similar devices) 0 []  - Simple Staple / Suture removal (25 or less) 0 []  - Complex Staple / Suture removal (26 or more) 0 []  - Hypo / Hyperglycemic Management (close monitor of Blood Glucose) 0 []  - Ankle / Brachial Index (ABI) - do not check if billed separately 0 X - Vital Signs 1 5 Has the patient been seen at the hospital within the last three years: Yes Total Score: 90 Level Of Care: New/Established - Level 3 Electronic Signature(s) Signed: 06/16/2019 6:50:57 PM By: Baruch Gouty RN, BSN Entered By: Baruch Gouty on 06/16/2019 09:05:30 -------------------------------------------------------------------------------- Encounter Discharge Information Details Patient Name: Date of Service: David David 06/16/2019 8:45 AM Medical Record [id number] Patient Account Number: [id number] Date of Birth/Sex: Treating RN: 17-Feb-1951 (69 y.o. [name] Primary Care Siddhi Dornbush: [name],  [name] Other Clinician: Referring Adelise Buswell: Treating Giovany Cosby/Extender:Stone III, [name], [name] in Treatment: 1 Encounter Discharge Information Items Post Procedure Vitals Discharge Condition: Stable Temperature (F): 98.5 Ambulatory Status: Ambulatory Pulse (bpm): 73 Discharge Destination: Home Respiratory Rate (breaths/min): 18 Transportation: Private Auto Blood Pressure (mmHg): 139/77 Accompanied By: self Schedule Follow-up Appointment: Yes Clinical Summary of Care: Electronic Signature(s) Signed: 06/16/2019 5:08:19 PM By: Deon Pilling Entered By: Deon Pilling on 06/16/2019 09:20:08 -------------------------------------------------------------------------------- Lower Extremity Assessment Details Patient Name: Date of Service: David David, David David 06/16/2019 8:45 AM Medical Record [id number] Patient Account Number: [id number] Date of Birth/Sex: Treating RN: July 07, 1950 (69 y.o. [name] Primary Care Mithra Spano: [name], [name] Other Clinician: Referring Crit Obremski: Treating Rafferty Postlewait/Extender:Stone III, [name], [name] in Treatment: 1 Edema Assessment Assessed: [Left: Yes] [Right: No] Edema: [Left: N] [Right: o] Calf Left: Right: Point of Measurement: 30 cm From Medial Instep 30.8 cm cm Ankle Left: Right: Point of Measurement: 11 cm From Medial Instep 21 cm cm Vascular Assessment Pulses: Dorsalis Pedis Palpable: [Left:Yes] Electronic Signature(s) Signed: 06/16/2019 5:08:19 PM By: Deon Pilling Entered By: Deon Pilling on 06/16/2019 08:48:33 -------------------------------------------------------------------------------- Wall Details Patient Name: Date of Service: David David 06/16/2019 8:45 AM Medical Record [id number] Patient Account Number: [id number] Date of Birth/Sex: Treating RN: [birth year] (69 y.o. [name] Primary Care Valente Fosberg: [name], [name] Other Clinician: Referring Stellah Donovan:  Treating Mikaele Stecher/Extender:Stone III, [name], [name] in Treatment: 1 Active Inactive Peripheral Neuropathy Nursing Diagnoses: Knowledge deficit related to disease process and management of peripheral neurovascular dysfunction Potential alteration in peripheral tissue perfusion (select prior to confirmation of diagnosis) Goals: Patient/caregiver will verbalize understanding of disease process and disease management Date Initiated: 06/09/2019 Target Resolution Date: 07/07/2019 Goal Status: Active Interventions: Assess signs and symptoms of neuropathy upon admission and as needed Provide education on  Management of Neuropathy and Related Ulcers Notes: Wound/Skin Impairment Nursing Diagnoses: Impaired tissue integrity Knowledge deficit related to smoking impact on wound healing Knowledge deficit related to ulceration/compromised skin integrity Goals: Patient will demonstrate a reduced rate of smoking or cessation of smoking Date Initiated: 06/09/2019 Target Resolution Date: 07/07/2019 Goal Status: Active Patient/caregiver will verbalize understanding of skin care regimen Date Initiated: 06/09/2019 Target Resolution Date: 07/07/2019 Goal Status: Active Ulcer/skin breakdown will have a volume reduction of 30% by week 4 Date Initiated: 06/09/2019 Target Resolution Date: 07/07/2019 Goal Status: Active Interventions: Assess patient/caregiver ability to obtain necessary supplies Assess patient/caregiver ability to perform ulcer/skin care regimen upon admission and as needed Assess ulceration(s) every visit Provide education on smoking Provide education on ulcer and skin care Treatment Activities: Skin care regimen initiated : 06/09/2019 Topical wound management initiated : 06/09/2019 Notes: Electronic Signature(s) Signed: 06/16/2019 6:50:57 PM By: Baruch Gouty RN, BSN Entered By: Baruch Gouty on 06/16/2019  09:04:21 -------------------------------------------------------------------------------- Pain Assessment Details Patient Name: Date of Service: David David 06/16/2019 8:45 AM Medical Record [id number] Patient Account Number: [id number] Date of Birth/Sex: Treating RN: 17-Jun-1950 (69 y.o. [name] Primary Care Gerlad Pelzel: [name], [name] Other Clinician: Referring Naeem Quillin: Treating Fiora Weill/Extender:Stone III, [name], [name] in Treatment: 1 Active Problems Location of Pain Severity and Description of Pain Patient Has Paino No Site Locations Rate the pain. Current Pain Level: 0 Pain Management and Medication Current Pain Management: Medication: No Cold Application: No Rest: No Massage: No Activity: No T.E.N.S.: No Heat Application: No Leg drop or elevation: No Is the Current Pain Management Adequate: Adequate How does your wound impact your activities of daily livingo Sleep: No Bathing: No Appetite: No Relationship With Others: No Bladder Continence: No Emotions: No Bowel Continence: No Work: No Toileting: No Drive: No Dressing: No Hobbies: No Electronic Signature(s) Signed: 06/16/2019 5:08:19 PM By: Deon Pilling Entered By: Deon Pilling on 06/16/2019 08:48:21 -------------------------------------------------------------------------------- Patient/Caregiver Education Details Patient Name: David David 3/31/2021andnbsp8:45 Date of Service: AM Medical Record [id number] Number: Patient Account Number: [id number] Treating RN: Date of Birth/Gender: 1950/06/02 (68 y.o. [name]) Other Clinician: Primary Care Physician: [name], [name] Treating Worthy Keeler Referring Physician: Physician/Extender: [name], [name] Weeks in Treatment: 1 Education Assessment Education Provided To: Patient Education Topics Provided Peripheral Neuropathy: Methods: Explain/Verbal Responses: Reinforcements needed, State content  correctly Wound/Skin Impairment: Methods: Explain/Verbal Responses: Reinforcements needed, State content correctly Electronic Signature(s) Signed: 06/16/2019 6:50:57 PM By: Baruch Gouty RN, BSN Entered By: Baruch Gouty on 06/16/2019 09:04:52 -------------------------------------------------------------------------------- Wound Assessment Details Patient Name: Date of Service: David David 06/16/2019 8:45 AM Medical Record [id number] Patient Account Number: [id number] Date of Birth/Sex: Treating RN: 01-02-1951 (69 y.o. [name] Primary Care Lurie Mullane: [name], [name] Other Clinician: Referring Shaketha Jeon: Treating Juanetta Negash/Extender:Stone III, [name], [name] in Treatment: 1 Wound Status Wound Number: 1 Primary Neuropathic Ulcer-Non Diabetic Etiology: Wound Location: Left Foot - Plantar Wound Open Wounding Event: Blister Status: Date Acquired: 05/17/2019 Comorbid Chronic Obstructive Pulmonary Disease Weeks Of Treatment: 1 History: (COPD), Hypertension, Peripheral Arterial Clustered Wound: No Disease, Peripheral Venous Disease, Osteoarthritis, Neuropathy, Seizure Disorder Wound Measurements Length: (cm) 0.9 % Reduct Width: (cm) 1.9 % Reduct Depth: (cm) 0.2 Epitheli Area: (cm) 1.343 Tunneli Volume: (cm) 0.269 Undermi Wound Description Classification: Full Thickness Without Exposed Support Foul Od Structures Slough/ Wound Flat and Intact Margin: Exudate Medium Amount: Exudate Serosanguineous Type: Exudate red, brown Color: Wound Bed Granulation Amount: Large (67-100%) Granulation Quality: Pink Fascia E Necrotic Amount: Small (1-33%) Fat Layer ( Necrotic  Quality: Adherent Slough Tendon Expo Muscle Expo Joint Expos Bone Expose or After Cleansing: No Fibrino Yes Exposed Structure xposed: No Subcutaneous Tissue) Exposed: Yes sed: No sed: No ed: No d: No ion in Area: 54.4% ion in Volume: 69.6% alization: Small (1-33%) David David:  No ning: No Treatment Notes Wound #1 (Left, Plantar Foot) 1. Cleanse With Wound Cleanser 3. Primary Dressing Applied Calcium Alginate Ag 4. Secondary Dressing Dry Gauze 5. Secured With Medipore tape 7. Footwear/Offloading device applied Surgical shoe Electronic Signature(s) Signed: 06/16/2019 5:08:19 PM By: Deon Pilling Entered By: Deon Pilling on 06/16/2019 08:49:17 -------------------------------------------------------------------------------- Vitals Details Patient Name: Date of Service: David David 06/16/2019 8:45 AM Medical Record [id number] Patient Account Number: [id number] Date of Birth/Sex: Treating RN: 03-19-1950 (69 y.o. [name] Primary Care Roy Snuffer: [name], [name] Other Clinician: Referring Trystin Terhune: Treating Nyla Creason/Extender:Stone III, [name], [name] in Treatment: 1 Vital Signs Time Taken: 08:43 Temperature (F): 98.5 Height (in): 75 Pulse (bpm): 73 Weight (lbs): 185 Respiratory Rate (breaths/min): 18 Body Mass Index (BMI): 23.1 Blood Pressure (mmHg): 139/77 Reference Range: 80 - 120 mg / dl Electronic Signature(s) Signed: 06/16/2019 5:08:19 PM By: Deon Pilling Entered By: Deon Pilling on 06/16/2019 08:48:11

## 2019-06-23 ENCOUNTER — Encounter (HOSPITAL_BASED_OUTPATIENT_CLINIC_OR_DEPARTMENT_OTHER): Payer: Medicare Other | Attending: Physician Assistant | Admitting: Physician Assistant

## 2019-06-23 ENCOUNTER — Other Ambulatory Visit: Payer: Self-pay

## 2019-06-23 DIAGNOSIS — G9009 Other idiopathic peripheral autonomic neuropathy: Secondary | ICD-10-CM | POA: Diagnosis not present

## 2019-06-23 DIAGNOSIS — Z95828 Presence of other vascular implants and grafts: Secondary | ICD-10-CM | POA: Diagnosis not present

## 2019-06-23 DIAGNOSIS — I7389 Other specified peripheral vascular diseases: Secondary | ICD-10-CM | POA: Insufficient documentation

## 2019-06-23 DIAGNOSIS — I10 Essential (primary) hypertension: Secondary | ICD-10-CM | POA: Diagnosis not present

## 2019-06-23 DIAGNOSIS — L97522 Non-pressure chronic ulcer of other part of left foot with fat layer exposed: Secondary | ICD-10-CM | POA: Diagnosis not present

## 2019-06-23 DIAGNOSIS — E785 Hyperlipidemia, unspecified: Secondary | ICD-10-CM | POA: Diagnosis not present

## 2019-06-23 NOTE — Progress Notes (Signed)
David David, David David ([id number]) Visit Report for 06/23/2019 Arrival Information Details Patient Name: Date of Service: David David, David David 06/23/2019 8:45 AM Medical Record [id number] Patient Account Number: [id number] Date of Birth/Sex: Treating RN: 12/24/1950 (68 y.o. David David) Carlene Coria Primary Care Nimo Verastegui: [name], [name] Other Clinician: Referring Mantaj Chamberlin: Treating Amylah Will/Extender:Stone III, [name], [name] in Treatment: 2 Visit Information History Since Last Visit All ordered tests and consults were completed: No Patient Arrived: Ambulatory Added or deleted any medications: No Arrival Time: 08:48 Any new allergies or adverse reactions: No Accompanied By: self Had a fall or experienced change in No Transfer Assistance: None activities of daily living that may affect Patient Identification Verified: Yes risk of falls: Secondary Verification Process Yes Signs or symptoms of abuse/neglect since last No Completed: visito Patient Requires Transmission-Based No Hospitalized since last visit: No Precautions: Implantable device outside of the clinic excluding No Patient Has Alerts: Yes cellular tissue based products placed in the center Patient Alerts: L ABI = 1.23 since last visit: (03/2019) Has Dressing in Place as Prescribed: Yes Pain Present Now: No Electronic Signature(s) Signed: 06/23/2019 5:52:27 PM By: Carlene Coria RN Entered By: Carlene Coria on 06/23/2019 08:49:41 -------------------------------------------------------------------------------- Clinic Level of Care Assessment Details Patient Name: Date of Service: David David, David David 06/23/2019 8:45 AM Medical Record [id number] Patient Account Number: [id number] Date of Birth/Sex: Treating RN: 08-May-1950 (69 y.o. David David Primary Care Phynix Horton: [name], [name] Other Clinician: Referring Lakima Dona: Treating Julez Huseby/Extender:Stone III, [name], [name] in Treatment: 2 Clinic Level of  Care Assessment Items TOOL 4 Quantity Score []  - Use when only an EandM is performed on FOLLOW-UP visit 0 ASSESSMENTS - Nursing Assessment / Reassessment X - Reassessment of Co-morbidities (includes updates in patient status) 1 10 X - Reassessment of Adherence to Treatment Plan 1 5 ASSESSMENTS - Wound and Skin Assessment / Reassessment X - Simple Wound Assessment / Reassessment - one wound 1 5 []  - Complex Wound Assessment / Reassessment - multiple wounds 0 []  - Dermatologic / Skin Assessment (not related to wound area) 0 ASSESSMENTS - Focused Assessment []  - Circumferential Edema Measurements - multi extremities 0 []  - Nutritional Assessment / Counseling / Intervention 0 X - Lower Extremity Assessment (monofilament, tuning fork, pulses) 1 5 []  - Peripheral Arterial Disease Assessment (using hand held doppler) 0 ASSESSMENTS - Ostomy and/or Continence Assessment and Care []  - Incontinence Assessment and Management 0 []  - Ostomy Care Assessment and Management (repouching, etc.) 0 PROCESS - Coordination of Care X - Simple Patient / Family Education for ongoing care 1 15 []  - Complex (extensive) Patient / Family Education for ongoing care 0 X - Staff obtains Programmer, systems, Records, Test Results / Process Orders 1 10 []  - Staff telephones HHA, Nursing Homes / Clarify orders / etc 0 []  - Routine Transfer to another Facility (non-emergent condition) 0 []  - Routine Hospital Admission (non-emergent condition) 0 []  - New Admissions / Biomedical engineer / Ordering NPWT, Apligraf, etc. 0 []  - Emergency Hospital Admission (emergent condition) 0 X - Simple Discharge Coordination 1 10 []  - Complex (extensive) Discharge Coordination 0 PROCESS - Special Needs []  - Pediatric / Minor Patient Management 0 []  - Isolation Patient Management 0 []  - Hearing / Language / Visual special needs 0 []  - Assessment of Community assistance (transportation, D/C planning, etc.) 0 []  - Additional assistance /  Altered mentation 0 []  - Support Surface(s) Assessment (bed, cushion, seat, etc.) 0 INTERVENTIONS - Wound Cleansing / Measurement X - Simple Wound Cleansing - one  wound 1 5 []  - Complex Wound Cleansing - multiple wounds 0 X - Wound Imaging (photographs - any number of wounds) 1 5 []  - Wound Tracing (instead of photographs) 0 X - Simple Wound Measurement - one wound 1 5 []  - Complex Wound Measurement - multiple wounds 0 INTERVENTIONS - Wound Dressings X - Small Wound Dressing one or multiple wounds 1 10 []  - Medium Wound Dressing one or multiple wounds 0 []  - Large Wound Dressing one or multiple wounds 0 X - Application of Medications - topical 1 5 []  - Application of Medications - injection 0 INTERVENTIONS - Miscellaneous []  - External ear exam 0 []  - Specimen Collection (cultures, biopsies, blood, body fluids, etc.) 0 []  - Specimen(s) / Culture(s) sent or taken to Lab for analysis 0 []  - Patient Transfer (multiple staff / Civil Service fast streamer / Similar devices) 0 []  - Simple Staple / Suture removal (25 or less) 0 []  - Complex Staple / Suture removal (26 or more) 0 []  - Hypo / Hyperglycemic Management (close monitor of Blood Glucose) 0 []  - Ankle / Brachial Index (ABI) - do not check if billed separately 0 X - Vital Signs 1 5 Has the patient been seen at the hospital within the last three years: Yes Total Score: 95 Level Of Care: New/Established - Level 3 Electronic Signature(s) Signed: 06/23/2019 6:07:56 PM By: Baruch Gouty RN, BSN Entered By: Baruch Gouty on 06/23/2019 09:41:30 -------------------------------------------------------------------------------- Encounter Discharge Information Details Patient Name: Date of Service: David David 06/23/2019 8:45 AM Medical Record [id number] Patient Account Number: [id number] Date of Birth/Sex: Treating RN: [birth year] (69 y.o. David David Primary Care Nashika Coker: [name], [name] Other Clinician: Referring Lacrystal Barbe: Treating  Jamarrius Salay/Extender:Stone III, [name], [name] in Treatment: 2 Encounter Discharge Information Items Discharge Condition: Stable Ambulatory Status: Ambulatory Discharge Destination: Home Transportation: Private Auto Accompanied By: self Schedule Follow-up Appointment: Yes Clinical Summary of Care: Patient Declined Electronic Signature(s) Signed: 06/23/2019 6:07:56 PM By: Baruch Gouty RN, BSN Entered By: Baruch Gouty on 06/23/2019 10:16:02 -------------------------------------------------------------------------------- Lower Extremity Assessment Details Patient Name: Date of Service: David David, David David 06/23/2019 8:45 AM Medical Record [id number] Patient Account Number: [id number] Date of Birth/Sex: Treating RN: [birth year] (68 y.o. David David Primary Care Almin Livingstone: [name], [name] Other Clinician: Referring Catheryn Slifer: Treating Oleg Oleson/Extender:Stone III, [name], [name] in Treatment: 2 Edema Assessment Assessed: [Left: No] [Right: No] Edema: [Left: N] [Right: o] Calf Left: Right: Point of Measurement: 30 cm From Medial Instep 31.5 cm cm Ankle Left: Right: Point of Measurement: 11 cm From Medial Instep 20.3 cm cm Electronic Signature(s) Signed: 06/23/2019 5:52:27 PM By: Carlene Coria RN Entered By: Carlene Coria on 06/23/2019 08:51:33 -------------------------------------------------------------------------------- Quentin Details Patient Name: Date of Service: David David. 06/23/2019 8:45 AM Medical Record [id number] Patient Account Number: [id number] Date of Birth/Sex: Treating RN: 14-Jul-1950 (69 y.o. David David Primary Care Kialee Kham: [name], [name] Other Clinician: Referring Kaprice Kage: Treating Cayne Yom/Extender:Stone III, [name], [name] in Treatment: 2 Active Inactive Peripheral Neuropathy Nursing Diagnoses: Knowledge deficit related to disease process and management of peripheral neurovascular  dysfunction Potential alteration in peripheral tissue perfusion (select prior to confirmation of diagnosis) Goals: Patient/caregiver will verbalize understanding of disease process and disease management Date Initiated: 06/09/2019 Target Resolution Date: 07/07/2019 Goal Status: Active Interventions: Assess signs and symptoms of neuropathy upon admission and as needed Provide education on Management of Neuropathy and Related Ulcers Notes: Wound/Skin Impairment Nursing Diagnoses: Impaired tissue integrity Knowledge deficit related to smoking impact on wound healing  Knowledge deficit related to ulceration/compromised skin integrity Goals: Patient will demonstrate a reduced rate of smoking or cessation of smoking Date Initiated: 06/09/2019 Target Resolution Date: 07/07/2019 Goal Status: Active Patient/caregiver will verbalize understanding of skin care regimen Date Initiated: 06/09/2019 Target Resolution Date: 07/07/2019 Goal Status: Active Ulcer/skin breakdown will have a volume reduction of 30% by week 4 Date Initiated: 06/09/2019 Target Resolution Date: 07/07/2019 Goal Status: Active Interventions: Assess patient/caregiver ability to obtain necessary supplies Assess patient/caregiver ability to perform ulcer/skin care regimen upon admission and as needed Assess ulceration(s) every visit Provide education on smoking Provide education on ulcer and skin care Treatment Activities: Skin care regimen initiated : 06/09/2019 Topical wound management initiated : 06/09/2019 Notes: Electronic Signature(s) Signed: 06/23/2019 6:07:56 PM By: Baruch Gouty RN, BSN Entered By: Baruch Gouty on 06/23/2019 09:13:44 -------------------------------------------------------------------------------- Pain Assessment Details Patient Name: Date of Service: David David 06/23/2019 8:45 AM Medical Record [id number] Patient Account Number: [id number] Date of Birth/Sex: Treating RN: January 15, 1951 (68  y.o. David David Primary Care Reniyah Gootee: [name], [name] Other Clinician: Referring Ricardo Schubach: Treating Quantay Zaremba/Extender:Stone III, [name], [name] in Treatment: 2 Active Problems Location of Pain Severity and Description of Pain Patient Has Paino No Site Locations Pain Management and Medication Current Pain Management: Electronic Signature(s) Signed: 06/23/2019 5:52:27 PM By: Carlene Coria RN Entered By: Carlene Coria on 06/23/2019 08:51:11 -------------------------------------------------------------------------------- Patient/Caregiver Education Details Patient Name: Date of Service: David David 4/7/2021andnbsp8:45 AM Medical Record [id number] Patient Account Number: [id number] Date of Birth/Gender: December 18, 1950 (68 y.o. M) Treating RN: Baruch Gouty Primary Care Physician: [name], [name] Other Clinician: Referring Physician: Treating Physician/Extender:Stone III, [name], [name] in Treatment: 2 Education Assessment Education Provided To: Patient Education Topics Provided Offloading: Methods: Explain/Verbal Responses: Reinforcements needed, State content correctly Wound/Skin Impairment: Methods: Explain/Verbal Responses: Reinforcements needed, State content correctly Electronic Signature(s) Signed: 06/23/2019 6:07:56 PM By: Baruch Gouty RN, BSN Entered By: Baruch Gouty on 06/23/2019 09:14:28 -------------------------------------------------------------------------------- Wound Assessment Details Patient Name: Date of Service: David David 06/23/2019 8:45 AM Medical Record [id number] Patient Account Number: [id number] Date of Birth/Sex: Treating RN: Jul 07, 1950 (68 y.o. David David Primary Care Treyvin Glidden: [name], [name] Other Clinician: Referring Marga Gramajo: Treating Aman Batley/Extender:Stone III, [name], [name] in Treatment: 2 Wound Status Wound Number: 1 Primary Neuropathic Ulcer-Non Diabetic Etiology: Wound  Location: Left, Plantar Foot Wound Open Wounding Event: Blister Status: Date Acquired: 05/17/2019 Comorbid Chronic Obstructive Pulmonary Disease Weeks Of Treatment: 2 History: (COPD), Hypertension, Peripheral Arterial Clustered Wound: No Disease, Peripheral Venous Disease, Osteoarthritis, Neuropathy, Seizure Disorder Wound Measurements Length: (cm) 0.5 % Reduct Width: (cm) 1.5 % Reduct Depth: (cm) 0.2 Epitheli Area: (cm) 0.589 Tunneli Volume: (cm) 0.118 Undermi Wound Description Classification: Full Thickness Without Exposed Support Foul Od Structures Slough/ Wound Flat and Intact Margin: Exudate Medium Amount: Exudate Serosanguineous Type: Exudate red, brown Color: Wound Bed Granulation Amount: Large (67-100%) Granulation Quality: Pink Fascia E Necrotic Amount: Small (1-33%) Fat Laye Necrotic Quality: Adherent Slough Tendon E Muscle E Joint Ex Bone Exp or After Cleansing: No Fibrino Yes Exposed Structure xposed: No r (Subcutaneous Tissue) Exposed: Yes xposed: No xposed: No posed: No osed: No ion in Area: 80% ion in Volume: 86.7% alization: Small (1-33%) ng: No ning: No Treatment Notes Wound #1 (Left, Plantar Foot) 3. Primary Dressing Applied Collegen [employer] 4. Secondary Dressing Dry Gauze 5. Secured With Recruitment consultant) Signed: 06/23/2019 5:52:27 PM By: Carlene Coria RN Entered By: Carlene Coria on 06/23/2019 08:53:37 -------------------------------------------------------------------------------- Vitals Details Patient Name: Date of Service: David David 06/23/2019  8:45 AM Medical Record [id number] Patient Account Number: [id number] Date of Birth/Sex: Treating RN: [birth year] (68 y.o. David David) Carlene Coria Primary Care Finneus Kaneshiro: [name], [name] Other Clinician: Referring Kollins Fenter: Treating Aramis Zobel/Extender:Stone III, [name], [name] in Treatment: 2 Vital Signs Time Taken: 08:50 Temperature (F): 98.2 Weight (lbs):  185 Pulse (bpm): 81 Respiratory Rate (breaths/min): 18 Blood Pressure (mmHg): 139/60 Reference Range: 80 - 120 mg / dl Electronic Signature(s) Signed: 06/23/2019 5:52:27 PM By: Carlene Coria RN Entered By: Carlene Coria on 06/23/2019 08:51:00

## 2019-06-23 NOTE — Progress Notes (Addendum)
DREAM, NODAL ([id number]) Visit Report for 06/23/2019 Chief Complaint Document Details Patient Name: Date of Service: David David, David David 06/23/2019 8:45 AM Medical Record [id number] Patient Account Number: [id number] Date of Birth/Sex: Treating RN: Sep 11, 1950 (69 y.o. David David Primary Care Provider: [name], David Other Clinician: Referring Provider: Treating Provider/Extender:David David, David David David, David Bradford in Treatment: 2 Information Obtained from: Patient Chief Complaint Left plantar foot ulcer Electronic Signature(s) Signed: 06/23/2019 8:49:58 AM By: Worthy Keeler PA-C Entered By: Worthy Keeler on 06/23/2019 08:49:57 -------------------------------------------------------------------------------- HPI Details Patient Name: Date of Service: David David 06/23/2019 8:45 AM Medical Record [id number] Patient Account Number: [id number] Date of Birth/Sex: Treating RN: 11-Sep-1950 (69 y.o. David David Primary Care Provider: [name], David Other Clinician: Referring Provider: Treating Provider/Extender:David David, David David David, David Bradford in Treatment: 2 History of Present Illness HPI Description: 06/09/2019 upon evaluation today patient presents for initial inspection here in the clinic concerning issues he has been having with his plantar foot distally at right at the base of his toes centrally. He tells me this started with a blister back in September of last year he has had some issues following and more recently he has had trouble with blistering at the base of his toes. Finally a week ago it got to the point that he decided to going to be seen. He was seen at [facility] last week he was given Keflex and doxycycline I recommended to follow-up with wound care. Fortunately there is no signs of active infection at this time. No fevers, chills, nausea, vomiting, or diarrhea. The patient has not confirmed diabetic although he did have an  elevated blood glucose level in the hospital when he was seen last week. He does have neuropathy considered to be idiopathic at this point. He also has peripheral vascular disease has had a previous stent to the left lower extremity he also had recent ABIs which measured at 1.23 2021. Subsequently he does have COPD and hypertension as well. He notes that the initial blistering actually occurred as a result of the patient walking outside without shoes on in September when it was much too hot he could not feel and incidentally burned his feet. 06/16/2019 upon evaluation today patient appears to be doing well with regard to his foot ulcer. He has been tolerating the dressing changes with the alginate. Things do seem to be drying up which is great news. Fortunately there is no signs of active infection at this time. No fevers, chills, nausea, vomiting, or diarrhea. 06/23/19 upon evaluation today patient appears to be doing excellent in regard to his foot ulcer. He has been tolerating the dressing changes with the alginate without complication. The wound actually appears to be somewhat dry in the wound bed. Fortunately there is no sign of active infection at this time. No fevers, chills, nausea, vomiting, or diarrhea. Electronic Signature(s) Signed: 06/23/2019 9:51:45 AM By: Worthy Keeler PA-C Entered By: Worthy Keeler on 06/23/2019 09:51:44 -------------------------------------------------------------------------------- Physical Exam Details Patient Name: Date of Service: David David 06/23/2019 8:45 AM Medical Record [id number] Patient Account Number: [id number] Date of Birth/Sex: Treating RN: 1951/02/03 (69 y.o. David David Primary Care Provider: [name], David Other Clinician: Referring Provider: Treating Provider/Extender:David David, Jalynn Betzold David, David Bradford in Treatment: 2 Constitutional Well-nourished and well-hydrated in no acute distress. Respiratory normal breathing  without difficulty. Psychiatric this patient is able to make decisions and demonstrates good insight into disease process. Alert and Oriented x 3. pleasant and cooperative. Notes  Patient's wound bed currently showed signs of good granulation the wound bed was somewhat dry I do believe he would do better with collagen at this point I think it will help speed up the healing process. Other than that I feel like things are on the up and up. Electronic Signature(s) Signed: 06/23/2019 9:52:10 AM By: Worthy Keeler PA-C Entered By: Worthy Keeler on 06/23/2019 09:52:10 -------------------------------------------------------------------------------- Physician Orders Details Patient Name: Date of Service: David David 06/23/2019 8:45 AM Medical Record [id number] Patient Account Number: [id number] Date of Birth/Sex: Treating RN: 15-Apr-1950 (69 y.o. David David Primary Care Provider: [name], David Other Clinician: Referring Provider: Treating Provider/Extender:David David, David David David, David Bradford in Treatment: 2 Verbal / Phone Orders: No Diagnosis Coding ICD-10 Coding Code Description G90.09 Other idiopathic peripheral autonomic neuropathy L97.522 Non-pressure chronic ulcer of other part of left foot with fat layer exposed R73.9 Hyperglycemia, unspecified I73.89 Other specified peripheral vascular diseases J44.9 Chronic obstructive pulmonary disease, unspecified I10 Essential (primary) hypertension Follow-up Appointments Return Appointment in 1 week. Dressing Change Frequency Wound #1 Left,Plantar Foot Change dressing every day. Wound Cleansing Wound #1 Left,Plantar Foot May shower and wash wound with soap and water. Primary Wound Dressing Wound #1 Left,Plantar Foot Silver Collagen - moisten with saline Secondary Dressing Wound #1 Left,Plantar Foot Kerlix/Rolled Gauze Dry Gauze Off-Loading Open toe surgical shoe to: - left foot Electronic Signature(s) Signed:  06/23/2019 6:07:56 PM By: Baruch Gouty RN, BSN Signed: 06/23/2019 7:02:55 PM By: Worthy Keeler PA-C Entered By: Baruch Gouty on 06/23/2019 09:40:54 -------------------------------------------------------------------------------- Problem List Details Patient Name: Date of Service: David David. 06/23/2019 8:45 AM Medical Record [id number] Patient Account Number: [id number] Date of Birth/Sex: Treating RN: 1950/12/13 (69 y.o. David David Primary Care Provider: [name], David Other Clinician: Referring Provider: Treating Provider/Extender:David David, Johm Pfannenstiel David, David Bradford in Treatment: 2 Active Problems ICD-10 Evaluated Encounter Code Description Active Date Today Diagnosis G90.09 Other idiopathic peripheral autonomic neuropathy 06/09/2019 No Yes L97.522 Non-pressure chronic ulcer of other part of left foot 06/09/2019 No Yes with fat layer exposed R73.9 Hyperglycemia, unspecified 06/09/2019 No Yes I73.89 Other specified peripheral vascular diseases 06/09/2019 No Yes J44.9 Chronic obstructive pulmonary disease, unspecified 06/09/2019 No Yes I10 Essential (primary) hypertension 06/09/2019 No Yes Inactive Problems Resolved Problems Electronic Signature(s) Signed: 06/23/2019 8:48:21 AM By: Worthy Keeler PA-C Entered By: Worthy Keeler on 06/23/2019 08:48:21 -------------------------------------------------------------------------------- Progress Note Details Patient Name: Date of Service: David David 06/23/2019 8:45 AM Medical Record [id number] Patient Account Number: [id number] Date of Birth/Sex: Treating RN: 12-Nov-1950 (69 y.o. David David Primary Care Provider: [name], David Other Clinician: Referring Provider: Treating Provider/Extender:David David, Francesa Eugenio David, David Bradford in Treatment: 2 Subjective Chief Complaint Information obtained from Patient Left plantar foot ulcer History of Present Illness (HPI) 06/09/2019 upon evaluation today  patient presents for initial inspection here in the clinic concerning issues he has been having with his plantar foot distally at right at the base of his toes centrally. He tells me this started with a blister back in September of last year he has had some issues following and more recently he has had trouble with blistering at the base of his toes. Finally a week ago it got to the point that he decided to going to be seen. He was seen at [name] last week he was given Keflex and doxycycline I recommended to follow-up with wound care. Fortunately there is no signs of active infection at this time. No fevers, chills, nausea, vomiting, or  diarrhea. The patient has not confirmed diabetic although he did have an elevated blood glucose level in the hospital when he was seen last week. He does have neuropathy considered to be idiopathic at this point. He also has peripheral vascular disease has had a previous stent to the left lower extremity he also had recent ABIs which measured at 1.23 2021. Subsequently he does have COPD and hypertension as well. He notes that the initial blistering actually occurred as a result of the patient walking outside without shoes on in September when it was much too hot he could not feel and incidentally burned his feet. 06/16/2019 upon evaluation today patient appears to be doing well with regard to his foot ulcer. He has been tolerating the dressing changes with the alginate. Things do seem to be drying up which is great news. Fortunately there is no signs of active infection at this time. No fevers, chills, nausea, vomiting, or diarrhea. 06/23/19 upon evaluation today patient appears to be doing excellent in regard to his foot ulcer. He has been tolerating the dressing changes with the alginate without complication. The wound actually appears to be somewhat dry in the wound bed. Fortunately there is no sign of active infection at this time. No fevers, chills,  nausea, vomiting, or diarrhea. Objective Constitutional Well-nourished and well-hydrated in no acute distress. Vitals Time Taken: 8:50 AM, Weight: 185 lbs, Temperature: 98.2 F, Pulse: 81 bpm, Respiratory Rate: 18 breaths/min, Blood Pressure: 139/60 mmHg. Respiratory normal breathing without difficulty. Psychiatric this patient is able to make decisions and demonstrates good insight into disease process. Alert and Oriented x 3. pleasant and cooperative. General Notes: Patient's wound bed currently showed signs of good granulation the wound bed was somewhat dry I do believe he would do better with collagen at this point I think it will help speed up the healing process. Other than that I feel like things are on the up and up. Integumentary (Hair, Skin) Wound #1 status is Open. Original cause of wound was Blister. The wound is located on the [place]. The wound measures 0.5cm length x 1.5cm width x 0.2cm depth; 0.589cm^2 area and 0.118cm^3 volume. There is Fat Layer (Subcutaneous Tissue) Exposed exposed. There is no tunneling or undermining noted. There is a medium amount of serosanguineous drainage noted. The wound margin is flat and intact. There is large (67-100%) pink granulation within the wound bed. There is a small (1-33%) amount of necrotic tissue within the wound bed including Adherent Slough. Assessment Active Problems ICD-10 Other idiopathic peripheral autonomic neuropathy Non-pressure chronic ulcer of other part of left foot with fat layer exposed Hyperglycemia, unspecified Other specified peripheral vascular diseases Chronic obstructive pulmonary disease, unspecified Essential (primary) hypertension Plan Follow-up Appointments: Return Appointment in 1 week. Dressing Change Frequency: Wound #1 Left,Plantar Foot: Change dressing every day. Wound Cleansing: Wound #1 Left,Plantar Foot: May shower and wash wound with soap and water. Primary Wound  Dressing: Wound #1 Left,Plantar Foot: Silver Collagen - moisten with saline Secondary Dressing: Wound #1 Left,Plantar Foot: Kerlix/Rolled Gauze Dry Gauze Off-Loading: Open toe surgical shoe to: - left foot 1. My suggestion at this time is good to be that we go ahead and initiate treatment with silver collagen as opposed to the alginate I think this will keep it from drying out too much and should hopefully help with more rapid healing. 2. I would recommend he continue with the postop surgical shoe as well I think that is beneficial. We will see patient back for reevaluation  in 1 week here in the clinic. If anything worsens or changes patient will contact our office for additional recommendations. Electronic Signature(s) Signed: 06/23/2019 9:52:41 AM By: Worthy Keeler PA-C Entered By: Worthy Keeler on 06/23/2019 09:52:40 -------------------------------------------------------------------------------- SuperBill Details Patient Name: Date of Service: David David 06/23/2019 Medical Record [id number] Patient Account Number: [id number] Date of Birth/Sex: Treating RN: 09-Jan-1951 (69 y.o. David David Primary Care Provider: [name], David Other Clinician: Referring Provider: Treating Provider/Extender:David David, Daleisa Halperin David, David Bradford in Treatment: 2 Diagnosis Coding ICD-10 Codes Code Description G90.09 Other idiopathic peripheral autonomic neuropathy L97.522 Non-pressure chronic ulcer of other part of left foot with fat layer exposed R73.9 Hyperglycemia, unspecified I73.89 Other specified peripheral vascular diseases J44.9 Chronic obstructive pulmonary disease, unspecified I10 Essential (primary) hypertension Facility Procedures CPT4 Code: [id number] Description: 99213 - WOUND CARE VISIT-LEV 3 EST PT Modifier: Quantity: 1 Physician Procedures CPT4 Code Description: [id number] - WC PHYS LEVEL 3 - EST PT ICD-10 Diagnosis Description G90.09 Other idiopathic  peripheral autonomic neuropathy L97.522 Non-pressure chronic ulcer of other part of left foot w R73.9 Hyperglycemia, unspecified I73.89  Other specified peripheral vascular diseases Modifier: ith fat layer ex Quantity: 1 posed Electronic Signature(s) Signed: 06/23/2019 9:52:54 AM By: Worthy Keeler PA-C Entered By: Worthy Keeler on 06/23/2019 09:52:53

## 2019-06-30 ENCOUNTER — Encounter (HOSPITAL_BASED_OUTPATIENT_CLINIC_OR_DEPARTMENT_OTHER): Payer: Medicare Other | Admitting: Physician Assistant

## 2019-06-30 ENCOUNTER — Other Ambulatory Visit: Payer: Self-pay

## 2019-06-30 DIAGNOSIS — G9009 Other idiopathic peripheral autonomic neuropathy: Secondary | ICD-10-CM | POA: Diagnosis not present

## 2019-06-30 DIAGNOSIS — L97522 Non-pressure chronic ulcer of other part of left foot with fat layer exposed: Secondary | ICD-10-CM | POA: Diagnosis not present

## 2019-06-30 DIAGNOSIS — I7389 Other specified peripheral vascular diseases: Secondary | ICD-10-CM | POA: Diagnosis not present

## 2019-06-30 DIAGNOSIS — I10 Essential (primary) hypertension: Secondary | ICD-10-CM | POA: Diagnosis not present

## 2019-06-30 DIAGNOSIS — Z95828 Presence of other vascular implants and grafts: Secondary | ICD-10-CM | POA: Diagnosis not present

## 2019-06-30 DIAGNOSIS — R739 Hyperglycemia, unspecified: Secondary | ICD-10-CM | POA: Diagnosis not present

## 2019-06-30 DIAGNOSIS — I87312 Chronic venous hypertension (idiopathic) with ulcer of left lower extremity: Secondary | ICD-10-CM | POA: Diagnosis not present

## 2019-06-30 NOTE — Progress Notes (Addendum)
David David ([id number]) Visit Report for 06/30/2019 Chief Complaint Document Details Patient Name: Date of Service: David David, David David 06/30/2019 8:45 AM Medical Record [id number] Patient Account Number: [id number] Date of Birth/Sex: Treating RN: 1951-03-07 (69 y.o. David David Primary Care Provider: [name], David Other Clinician: Referring Provider: Treating Provider/Extender:David David David David in Treatment: 3 Information Obtained from: Patient Chief Complaint Left plantar foot ulcer Electronic Signature(s) Signed: 06/30/2019 9:15:42 AM By: Worthy Keeler PA-C Entered By: Worthy Keeler on 06/30/2019 09:15:42 -------------------------------------------------------------------------------- HPI Details Patient Name: Date of Service: David David 06/30/2019 8:45 AM Medical Record [id number] Patient Account Number: [id number] Date of Birth/Sex: Treating RN: 1950/06/13 (69 y.o. David David Primary Care Provider: [name], David Other Clinician: Referring Provider: Treating Provider/Extender:David David David David in Treatment: 3 History of Present Illness HPI Description: 06/09/2019 upon evaluation today patient presents for initial inspection here in the clinic concerning issues he has been having with his plantar foot distally at right at the base of his toes centrally. He tells me this started with a blister back in September of last year he has had some issues following and more recently he has had trouble with blistering at the base of his toes. Finally a week ago it got to the point that he decided to going to be seen. He was seen at [facility] last week he was given Keflex and doxycycline I recommended to follow-up with wound care. Fortunately there is no signs of active infection at this time. No fevers, chills, nausea, vomiting, or diarrhea. The patient has not confirmed diabetic although he did have an  elevated blood glucose level in the hospital when he was seen last week. He does have neuropathy considered to be idiopathic at this point. He also has peripheral vascular disease has had a previous stent to the left lower extremity he also had recent ABIs which measured at 1.23 2021. Subsequently he does have COPD and hypertension as well. He notes that the initial blistering actually occurred as a result of the patient walking outside without shoes on in September when it was much too hot he could not feel and incidentally burned his feet. 06/16/2019 upon evaluation today patient appears to be doing well with regard to his foot ulcer. He has been tolerating the dressing changes with the alginate. Things do seem to be drying up which is great news. Fortunately there is no signs of active infection at this time. No fevers, chills, nausea, vomiting, or diarrhea. 06/23/19 upon evaluation today patient appears to be doing excellent in regard to his foot ulcer. He has been tolerating the dressing changes with the alginate without complication. The wound actually appears to be somewhat dry in the wound bed. Fortunately there is no sign of active infection at this time. No fevers, chills, nausea, vomiting, or diarrhea. 06/30/2019 upon evaluation today patient appears to be doing excellent in regard to his foot ulcer. He has been tolerating the dressing changes without complication. Fortunately there is no signs of active infection at this time which is great news. Overall I am very pleased with how things seem to be progressing currently. The wound bed appears to be much more moist and healthy even compared to last week I think this is good to help him to heal that much more quickly. Electronic Signature(s) Signed: 06/30/2019 9:49:30 AM By: Worthy Keeler PA-C Entered By: Worthy Keeler on 06/30/2019 09:49:30 -------------------------------------------------------------------------------- Physical Exam  Details Patient Name:  Date of Service: David David 06/30/2019 8:45 AM Medical Record [id number] Patient Account Number: [id number] Date of Birth/Sex: Treating RN: 1950/09/24 (69 y.o. David David Primary Care Provider: [name], David Other Clinician: Referring Provider: Treating Provider/Extender:David III, Baden Betsch David David in Treatment: 3 Constitutional Well-nourished and well-hydrated in no acute distress. Respiratory normal breathing without difficulty. Psychiatric this patient is able to make decisions and demonstrates good insight into disease process. Alert and Oriented x 3. pleasant and cooperative. Notes Patient's wound actually shows excellent granulation at this point and overall I am very pleased with how things seem to be progressing. There is no signs of active infection at this time. Electronic Signature(s) Signed: 06/30/2019 9:49:45 AM By: Worthy Keeler PA-C Entered By: Worthy Keeler on 06/30/2019 09:49:44 -------------------------------------------------------------------------------- Physician Orders Details Patient Name: Date of Service: David David 06/30/2019 8:45 AM Medical Record [id number] Patient Account Number: [id number] Date of Birth/Sex: Treating RN: Dec 31, 1950 (69 y.o. David David Primary Care Provider: [name], David Other Clinician: Referring Provider: Treating Provider/Extender:David III, Lucus Lambertson David David in Treatment: 3 Verbal / Phone Orders: No Diagnosis Coding ICD-10 Coding Code Description G90.09 Other idiopathic peripheral autonomic neuropathy L97.522 Non-pressure chronic ulcer of other part of left foot with fat layer exposed R73.9 Hyperglycemia, unspecified I73.89 Other specified peripheral vascular diseases J44.9 Chronic obstructive pulmonary disease, unspecified I10 Essential (primary) hypertension Follow-up Appointments Return Appointment in 2 weeks. Dressing Change  Frequency Wound #1 Left,Plantar Foot Change dressing every day. Wound Cleansing Wound #1 Left,Plantar Foot May shower and wash wound with soap and water. Primary Wound Dressing Wound #1 Left,Plantar Foot Silver Collagen - moisten with saline Secondary Dressing Wound #1 Left,Plantar Foot Kerlix/Rolled Gauze Dry Gauze Off-Loading Open toe surgical shoe to: - left foot Electronic Signature(s) Signed: 06/30/2019 4:44:47 PM By: Worthy Keeler PA-C Signed: 06/30/2019 5:43:24 PM By: Baruch Gouty RN, BSN Entered By: Baruch Gouty on 06/30/2019 09:47:35 -------------------------------------------------------------------------------- Problem List Details Patient Name: Date of Service: David David. 06/30/2019 8:45 AM Medical Record [id number] Patient Account Number: [id number] Date of Birth/Sex: Treating RN: [birth year] (69 y.o. David David Primary Care Provider: [name], David Other Clinician: Referring Provider: Treating Provider/Extender:David III, Olesya Wike David David in Treatment: 3 Active Problems ICD-10 Evaluated Encounter Code Description Active Date Today Diagnosis G90.09 Other idiopathic peripheral autonomic neuropathy 06/09/2019 No Yes L97.522 Non-pressure chronic ulcer of other part of left foot 06/09/2019 No Yes with fat layer exposed R73.9 Hyperglycemia, unspecified 06/09/2019 No Yes I73.89 Other specified peripheral vascular diseases 06/09/2019 No Yes J44.9 Chronic obstructive pulmonary disease, unspecified 06/09/2019 No Yes I10 Essential (primary) hypertension 06/09/2019 No Yes Inactive Problems Resolved Problems Electronic Signature(s) Signed: 06/30/2019 9:15:36 AM By: Worthy Keeler PA-C Entered By: Worthy Keeler on 06/30/2019 09:15:35 -------------------------------------------------------------------------------- Progress Note Details Patient Name: Date of Service: David David 06/30/2019 8:45 AM Medical Record [id number]  Patient Account Number: [id number] Date of Birth/Sex: Treating RN: 1950/03/19 (69 y.o. David David Primary Care Provider: [name], David Other Clinician: Referring Provider: Treating Provider/Extender:David III, Caitlain Tweed David David in Treatment: 3 Subjective Chief Complaint Information obtained from Patient Left plantar foot ulcer History of Present Illness (HPI) 06/09/2019 upon evaluation today patient presents for initial inspection here in the clinic concerning issues he has been having with his plantar foot distally at right at the base of his toes centrally. He tells me this started with a blister back in September of last year he has had some issues following and more recently he has had  trouble with blistering at the base of his toes. Finally a week ago it got to the point that he decided to going to be seen. He was seen at [facility] last week he was given Keflex and doxycycline I recommended to follow-up with wound care. Fortunately there is no signs of active infection at this time. No fevers, chills, nausea, vomiting, or diarrhea. The patient has not confirmed diabetic although he did have an elevated blood glucose level in the hospital when he was seen last week. He does have neuropathy considered to be idiopathic at this point. He also has peripheral vascular disease has had a previous stent to the left lower extremity he also had recent ABIs which measured at 1.23 2021. Subsequently he does have COPD and hypertension as well. He notes that the initial blistering actually occurred as a result of the patient walking outside without shoes on in September when it was much too hot he could not feel and incidentally burned his feet. 06/16/2019 upon evaluation today patient appears to be doing well with regard to his foot ulcer. He has been tolerating the dressing changes with the alginate. Things do seem to be drying up which is great news. Fortunately there is no  signs of active infection at this time. No fevers, chills, nausea, vomiting, or diarrhea. 06/23/19 upon evaluation today patient appears to be doing excellent in regard to his foot ulcer. He has been tolerating the dressing changes with the alginate without complication. The wound actually appears to be somewhat dry in the wound bed. Fortunately there is no sign of active infection at this time. No fevers, chills, nausea, vomiting, or diarrhea. 06/30/2019 upon evaluation today patient appears to be doing excellent in regard to his foot ulcer. He has been tolerating the dressing changes without complication. Fortunately there is no signs of active infection at this time which is great news. Overall I am very pleased with how things seem to be progressing currently. The wound bed appears to be much more moist and healthy even compared to last week I think this is good to help him to heal that much more quickly. Objective Constitutional Well-nourished and well-hydrated in no acute distress. Vitals Time Taken: 8:54 AM, Weight: 185 lbs, Temperature: 97.9 F, Pulse: 75 bpm, Respiratory Rate: 18 breaths/min, Blood Pressure: 153/69 mmHg. Respiratory normal breathing without difficulty. Psychiatric this patient is able to make decisions and demonstrates good insight into disease process. Alert and Oriented x 3. pleasant and cooperative. General Notes: Patient's wound actually shows excellent granulation at this point and overall I am very pleased with how things seem to be progressing. There is no signs of active infection at this time. Integumentary (Hair, Skin) Wound #1 status is Open. Original cause of wound was Blister. The wound is located on the [place]. The wound measures 0.5cm length x 1.3cm width x 0.2cm depth; 0.511cm^2 area and 0.102cm^3 volume. There is Fat Layer (Subcutaneous Tissue) Exposed exposed. There is no tunneling noted, however, there is undermining starting at 12:00  and ending at 3:00 with a maximum distance of 0.3cm. There is a medium amount of serosanguineous drainage noted. The wound margin is flat and intact. There is large (67-100%) pink granulation within the wound bed. There is a small (1-33%) amount of necrotic tissue within the wound bed including Adherent Slough. Assessment Active Problems ICD-10 Other idiopathic peripheral autonomic neuropathy Non-pressure chronic ulcer of other part of left foot with fat layer exposed Hyperglycemia, unspecified Other specified peripheral  vascular diseases Chronic obstructive pulmonary disease, unspecified Essential (primary) hypertension Plan Follow-up Appointments: Return Appointment in 2 weeks. Dressing Change Frequency: Wound #1 Left,Plantar Foot: Change dressing every day. Wound Cleansing: Wound #1 Left,Plantar Foot: May shower and wash wound with soap and water. Primary Wound Dressing: Wound #1 Left,Plantar Foot: Silver Collagen - moisten with saline Secondary Dressing: Wound #1 Left,Plantar Foot: Kerlix/Rolled Gauze Dry Gauze Off-Loading: Open toe surgical shoe to: - left foot 1. My suggestion currently is can be that we continue with the collagen dressing I still think that is the best way to go. 2. I am also can recommend at this point that the patient continue to monitor for any signs of active infection at this point. Again I do not see anything but I think that that is always something to be on the look out for. The good news is the wound no longer extends in between his toes which is great news. 3. I do recommend he continue to use the postop surgical shoe. We will see patient back for reevaluation in 2 weeks here in the clinic. If anything worsens or changes patient will contact our office for additional recommendations. The patient was somewhat upset today about the wait time. He in fact voiced those concerns to me which I completely understand. Again I explained to him that we  are attempting to get into see him as quickly as possible as with every other patient as well. With that being said we are going to switch to a 2-week follow-up visit that way he is not here as often having to be seen. Hopefully that will at least help in some regard with regard to his weight times. Electronic Signature(s) Signed: 06/30/2019 9:50:59 AM By: Worthy Keeler PA-C Entered By: Worthy Keeler on 06/30/2019 09:50:58 -------------------------------------------------------------------------------- SuperBill Details Patient Name: Date of Service: David David 06/30/2019 Medical Record [id number] Patient Account Number: [id number] Date of Birth/Sex: Treating RN: [birth year] (69 y.o. David David Primary Care Provider: [name], David Other Clinician: Referring Provider: Treating Provider/Extender:David III, [place] Sanon David David in Treatment: 3 Diagnosis Coding ICD-10 Codes Code Description G90.09 Other idiopathic peripheral autonomic neuropathy L97.522 Non-pressure chronic ulcer of other part of left foot with fat layer exposed R73.9 Hyperglycemia, unspecified I73.89 Other specified peripheral vascular diseases J44.9 Chronic obstructive pulmonary disease, unspecified I10 Essential (primary) hypertension Facility Procedures CPT4 Code: [id number] Description: 99213 - WOUND CARE VISIT-LEV 3 EST PT Modifier: Quantity: 1 Physician Procedures CPT4 Code Description: [id number] 60109 - WC PHYS LEVEL 3 - EST PT ICD-10 Diagnosis Description G90.09 Other idiopathic peripheral autonomic neuropathy L97.522 Non-pressure chronic ulcer of other part of left foot w R73.9 Hyperglycemia, unspecified I73.89  Other specified peripheral vascular diseases Modifier: ith fat layer ex Quantity: 1 posed Electronic Signature(s) Signed: 06/30/2019 9:51:19 AM By: Worthy Keeler PA-C Entered By: Worthy Keeler on 06/30/2019 09:51:19

## 2019-06-30 NOTE — Progress Notes (Signed)
TREW, SUNDE ([id number]) Visit Report for 06/09/2019 Chief Complaint Document Details Patient Name: Date of Service: David David, David David 06/09/2019 9:00 AM Medical Record [id number] Patient Account Number: [id number] Date of Birth/Sex: Treating RN: 1950-09-30 (69 y.o. [name] Primary Care Provider: [name], [name] Other Clinician: Referring Provider: Treating Provider/Extender:Stone III, Kamira Mellette [name], [name] in Treatment: 0 Information Obtained from: Patient Chief Complaint Left plantar foot ulcer Electronic Signature(s) Signed: 06/09/2019 9:56:47 AM By: Worthy Keeler PA-C Entered By: Worthy Keeler on 06/09/2019 09:56:46 -------------------------------------------------------------------------------- Debridement Details Patient Name: Date of Service: David David 06/09/2019 9:00 AM Medical Record [id number] Patient Account Number: [id number] Date of Birth/Sex: 1950-09-07 (68 y.o. M) Treating RN: Baruch Gouty Primary Care Provider: [name], [name] Other Clinician: Referring Provider: Treating Provider/Extender:Stone III, Lanyla Costello [name], [name] in Treatment: 0 Debridement Performed for Wound #1 Left,Plantar Foot Assessment: Performed By: Physician Worthy Keeler, PA Debridement Type: Debridement Level of Consciousness (Pre- Awake and Alert procedure): Pre-procedure Verification/Time Out Taken: Yes - 10:05 Start Time: 10:12 Pain Control: Lidocaine 4% Topical Solution Total Area Debrided (L x W): 1.5 (cm) x 2.5 (cm) = 3.75 (cm) Tissue and other material Viable, Non-Viable, Slough, Subcutaneous, Skin: Epidermis, Slough debrided: Level: Skin/Subcutaneous Tissue Debridement Description: Excisional Instrument: Curette Bleeding: Minimum Hemostasis Achieved: Pressure End Time: 10:15 Procedural Pain: 0 Post Procedural Pain: 0 Response to Treatment: Procedure was tolerated well Level of Consciousness Awake and  Alert (Post-procedure): Post Debridement Measurements of Total Wound Length: (cm) 1.5 Width: (cm) 2.5 Depth: (cm) 0.3 Volume: (cm) 0.884 Character of Wound/Ulcer Post Improved Debridement: Post Procedure Diagnosis Same as Pre-procedure Electronic Signature(s) Signed: 06/09/2019 5:14:16 PM By: Worthy Keeler PA-C Signed: 06/09/2019 5:36:50 PM By: Baruch Gouty RN, BSN Entered By: Baruch Gouty on 06/09/2019 10:15:06 -------------------------------------------------------------------------------- HPI Details Patient Name: Date of Service: David David. 06/09/2019 9:00 AM Medical Record [id number] Patient Account Number: [id number] Date of Birth/Sex: Treating RN: April 20, 1950 (69 y.o. [name] Primary Care Provider: [name], [name] Other Clinician: Referring Provider: Treating Provider/Extender:Stone III, Marvine Encalade [name], [name] in Treatment: 0 History of Present Illness HPI Description: 06/09/2019 upon evaluation today patient presents for initial inspection here in the clinic concerning issues he has been having with his plantar foot distally at right at the base of his toes centrally. He tells me this started with a blister back in September of last year he has had some issues following and more recently he has had trouble with blistering at the base of his toes. Finally a week ago it got to the point that he decided to going to be seen. He was seen at [facility] last week he was given Keflex and doxycycline I recommended to follow-up with wound care. Fortunately there is no signs of active infection at this time. No fevers, chills, nausea, vomiting, or diarrhea. The patient has not confirmed diabetic although he did have an elevated blood glucose level in the hospital when he was seen last week. He does have neuropathy considered to be idiopathic at this point. He also has peripheral vascular disease has had a previous stent to the left lower  extremity he also had recent ABIs which measured at 1.23 2021. Subsequently he does have COPD and hypertension as well. He notes that the initial blistering actually occurred as a result of the patient walking outside without shoes on in September when it was much too hot he could not feel and incidentally burned his feet. Electronic Signature(s) Signed: 06/09/2019 10:23:53 AM By: Joaquim Lai  III, Tahiri Shareef PA-C Entered By: Worthy Keeler on 06/09/2019 10:23:52 -------------------------------------------------------------------------------- Paring/cutting 1 benign hyperkeratotic lesion Details Patient Name: Date of Service: David David, David David 06/09/2019 9:00 AM Medical Record [id number] Patient Account Number: [id number] Date of Birth/Sex: 02/09/1951 (68 y.o. M) Treating RN: Baruch Gouty Primary Care Provider: [name], [name] Other Clinician: Referring Provider: Treating Provider/Extender:Stone III, Loralyn Rachel [name], [name] in Treatment: 0 Procedure Performed for: Non-Wound Location Performed By: Physician Worthy Keeler, PA Post Procedure Diagnosis Same as Pre-procedure Notes left plantar foot using #3 curette Electronic Signature(s) Signed: 06/09/2019 5:14:16 PM By: Worthy Keeler PA-C Signed: 06/09/2019 5:36:50 PM By: Baruch Gouty RN, BSN Entered By: Baruch Gouty on 06/09/2019 10:13:01 -------------------------------------------------------------------------------- Physical Exam Details Patient Name: Date of Service: David David 06/09/2019 9:00 AM Medical Record [id number] Patient Account Number: [id number] Date of Birth/Sex: Treating RN: Apr 17, 1950 (69 y.o. [name] Primary Care Provider: [name], [name] Other Clinician: Referring Provider: Treating Provider/Extender:Stone III, Marah Park [name], [name] in Treatment: 0 Constitutional sitting or standing blood pressure is within target range for patient.. pulse regular and within target range for  patient.Marland Kitchen respirations regular, non-labored and within target range for patient.Marland Kitchen temperature within target range for patient.. Well-nourished and well-hydrated in no acute distress. Eyes conjunctiva clear no eyelid edema noted. pupils equal round and reactive to light and accommodation. Ears, Nose, Mouth, and Throat no gross abnormality of ear auricles or external auditory canals. normal hearing noted during conversation. mucus membranes moist. Respiratory normal breathing without difficulty. Cardiovascular 2+ dorsalis pedis/posterior tibialis pulses. no clubbing, cyanosis, significant edema, <3 sec cap refill. Musculoskeletal normal gait and posture. no significant deformity or arthritic changes, no loss or range of motion, no clubbing. Psychiatric this patient is able to make decisions and demonstrates good insight into disease process. Alert and Oriented x 3. pleasant and cooperative. Notes Upon inspection patient's wound on the plantar foot actually does have necrotic tissue that is going require some sharp debridement. Fortunately there is no signs of active infection. With that being said we are going to have to clean some this away and I did perform sharp debridement to clear away some of the necrotic debris today. Post debridement the wound bed actually appears to be doing significantly better which is great news. The patient also has confirmed good blood flow arterially which is also excellent news. Overall I think the biggest thing here is going to be controlling the moisture and helping the area to heal appropriately. Electronic Signature(s) Signed: 06/09/2019 4:47:21 PM By: Worthy Keeler PA-C Entered By: Worthy Keeler on 06/09/2019 16:47:21 -------------------------------------------------------------------------------- Physician Orders Details Patient Name: Date of Service: FELIBERTO, STOCKLEY 06/09/2019 9:00 AM Medical Record [id number] Patient Account Number:  [id number] Date of Birth/Sex: Treating RN: 1951/03/05 (68 y.o. [name] Primary Care Provider: [name], [name] Other Clinician: Referring Provider: Treating Provider/Extender:Stone III, Angeleigh Chiasson [name], [name] in Treatment: 0 Verbal / Phone Orders: No Diagnosis Coding ICD-10 Coding Code Description G90.09 Other idiopathic peripheral autonomic neuropathy L97.522 Non-pressure chronic ulcer of other part of left foot with fat layer exposed R73.9 Hyperglycemia, unspecified I73.89 Other specified peripheral vascular diseases J44.9 Chronic obstructive pulmonary disease, unspecified I10 Essential (primary) hypertension Follow-up Appointments Return Appointment in 1 week. Dressing Change Frequency Wound #1 Left,Plantar Foot Change dressing every day. Wound Cleansing Wound #1 Left,Plantar Foot May shower and wash wound with soap and water. Primary Wound Dressing Wound #1 Left,Plantar Foot Calcium Alginate with Silver Secondary Dressing Wound #1 Left,Plantar Foot Kerlix/Rolled Gauze Dry Gauze Off-Loading  Open toe surgical shoe to: - left foot Electronic Signature(s) Signed: 06/09/2019 5:14:16 PM By: Worthy Keeler PA-C Signed: 06/09/2019 5:36:50 PM By: Baruch Gouty RN, BSN Entered By: Baruch Gouty on 06/09/2019 10:16:17 -------------------------------------------------------------------------------- Problem List Details Patient Name: Date of Service: David David. 06/09/2019 9:00 AM Medical Record [id number] Patient Account Number: [id number] Date of Birth/Sex: Treating RN: 11-Mar-1951 (69 y.o. [name] Primary Care Provider: [name], [name] Other Clinician: Referring Provider: Treating Provider/Extender:Stone III, Roderic Lammert [name], [name] in Treatment: 0 Active Problems ICD-10 Evaluated Encounter Code Description Active Date Today Diagnosis G90.09 Other idiopathic peripheral autonomic neuropathy 06/09/2019 No Yes L97.522 Non-pressure  chronic ulcer of other part of left foot 06/09/2019 No Yes with fat layer exposed R73.9 Hyperglycemia, unspecified 06/09/2019 No Yes I73.89 Other specified peripheral vascular diseases 06/09/2019 No Yes J44.9 Chronic obstructive pulmonary disease, unspecified 06/09/2019 No Yes I10 Essential (primary) hypertension 06/09/2019 No Yes Inactive Problems Resolved Problems Electronic Signature(s) Signed: 06/09/2019 9:56:28 AM By: Worthy Keeler PA-C Entered By: Worthy Keeler on 06/09/2019 09:56:28 -------------------------------------------------------------------------------- Progress Note Details Patient Name: Date of Service: David David. 06/09/2019 9:00 AM Medical Record [id number] Patient Account Number: [id number] Date of Birth/Sex: Treating RN: [birth year] (69 y.o. [name] Primary Care Provider: [name], [name] Other Clinician: Referring Provider: Treating Provider/Extender:Stone III, Jahi Roza [name], [name] in Treatment: 0 Subjective Chief Complaint Information obtained from Patient Left plantar foot ulcer History of Present Illness (HPI) 06/09/2019 upon evaluation today patient presents for initial inspection here in the clinic concerning issues he has been having with his plantar foot distally at right at the base of his toes centrally. He tells me this started with a blister back in September of last year he has had some issues following and more recently he has had trouble with blistering at the base of his toes. Finally a week ago it got to the point that he decided to going to be seen. He was seen at [facility] last week he was given Keflex and doxycycline I recommended to follow-up with wound care. Fortunately there is no signs of active infection at this time. No fevers, chills, nausea, vomiting, or diarrhea. The patient has not confirmed diabetic although he did have an elevated blood glucose level in the hospital when he was seen last week. He  does have neuropathy considered to be idiopathic at this point. He also has peripheral vascular disease has had a previous stent to the left lower extremity he also had recent ABIs which measured at 1.23 2021. Subsequently he does have COPD and hypertension as well. He notes that the initial blistering actually occurred as a result of the patient walking outside without shoes on in September when it was much too hot he could not feel and incidentally burned his feet. Patient History Allergies adhesive tape (Reaction: hypersensitivity), Polysporin (Severity: Moderate, Reaction: rash), paroxetine (Severity: Moderate, Reaction: lethargy) Family History Diabetes - Siblings, Heart Disease - Father,Mother, Lung Disease - Siblings, No family history of Hereditary Spherocytosis, Hypertension, Kidney Disease, Seizures, Stroke, Thyroid Problems, Tuberculosis. Social History Current some day smoker - 1/2 per week, Marital Status - Divorced, Alcohol Use - Rarely, Drug Use - No History, Caffeine Use - Moderate - Coffee, Soda. Medical History Respiratory Patient has history of Chronic Obstructive Pulmonary Disease (COPD) Cardiovascular Patient has history of Hypertension, Peripheral Arterial Disease, Peripheral Venous Disease Musculoskeletal Patient has history of Osteoarthritis Neurologic Patient has history of Neuropathy, Seizure Disorder Medical And Surgical History Notes Cardiovascular Hyperlipidemia, TIA Gastrointestinal  GERD Endocrine Hypothyroidism Review of Systems (ROS) Constitutional Symptoms (General Health) Denies complaints or symptoms of Fatigue, Fever, Chills, Marked Weight Change. Ear/Nose/Mouth/Throat Denies complaints or symptoms of Chronic sinus problems or rhinitis. Endocrine Denies complaints or symptoms of Heat/cold intolerance. Genitourinary Denies complaints or symptoms of Frequent urination. Integumentary (Skin) Complains or has symptoms of Wounds - wound on left  foot. Psychiatric Denies complaints or symptoms of Claustrophobia, Suicidal. Objective Constitutional sitting or standing blood pressure is within target range for patient.. pulse regular and within target range for patient.Marland Kitchen respirations regular, non-labored and within target range for patient.Marland Kitchen temperature within target range for patient.. Well-nourished and well-hydrated in no acute distress. Vitals Time Taken: 9:06 AM, Height: 75 in, Source: Stated, Weight: 185 lbs, Source: Stated, BMI: 23.1, Temperature: 98.2 F, Pulse: 66 bpm, Respiratory Rate: 18 breaths/min, Blood Pressure: 136/69 mmHg. Eyes conjunctiva clear no eyelid edema noted. pupils equal round and reactive to light and accommodation. Ears, Nose, Mouth, and Throat no gross abnormality of ear auricles or external auditory canals. normal hearing noted during conversation. mucus membranes moist. Respiratory normal breathing without difficulty. Cardiovascular 2+ dorsalis pedis/posterior tibialis pulses. no clubbing, cyanosis, significant edema, Musculoskeletal normal gait and posture. no significant deformity or arthritic changes, no loss or range of motion, no clubbing. Psychiatric this patient is able to make decisions and demonstrates good insight into disease process. Alert and Oriented x 3. pleasant and cooperative. General Notes: Upon inspection patient's wound on the plantar foot actually does have necrotic tissue that is going require some sharp debridement. Fortunately there is no signs of active infection. With that being said we are going to have to clean some this away and I did perform sharp debridement to clear away some of the necrotic debris today. Post debridement the wound bed actually appears to be doing significantly better which is great news. The patient also has confirmed good blood flow arterially which is also excellent news. Overall I think the biggest thing here is going to be controlling the  moisture and helping the area to heal appropriately. Integumentary (Hair, Skin) Wound #1 status is Open. Original cause of wound was Blister. The wound is located on the [place]. The wound measures 1.5cm length x 2.5cm width x 0.3cm depth; 2.945cm^2 area and 0.884cm^3 volume. There is Fat Layer (Subcutaneous Tissue) Exposed exposed. There is no tunneling or undermining noted. There is a medium amount of serosanguineous drainage noted. The wound margin is flat and intact. There is medium (34-66%) pink granulation within the wound bed. There is a medium (34-66%) amount of necrotic tissue within the wound bed including Adherent Slough. Assessment Active Problems ICD-10 Other idiopathic peripheral autonomic neuropathy Non-pressure chronic ulcer of other part of left foot with fat layer exposed Hyperglycemia, unspecified Other specified peripheral vascular diseases Chronic obstructive pulmonary disease, unspecified Essential (primary) hypertension Procedures Wound #1 Pre-procedure diagnosis of Wound #1 is a Neuropathic Ulcer-Non Diabetic located on the [place] . There was a Excisional Skin/Subcutaneous Tissue Debridement with a total area of 3.75 sq cm performed by Worthy Keeler, PA. With the following instrument(s): Curette to remove Viable and Non-Viable tissue/material. Material removed includes Subcutaneous Tissue, Slough, and Skin: Epidermis after achieving pain control using Lidocaine 4% Topical Solution. No specimens were taken. A time out was conducted at 10:05, prior to the start of the procedure. A Minimum amount of bleeding was controlled with Pressure. The procedure was tolerated well with a pain level of 0 throughout and a pain level of 0 following  the procedure. Post Debridement Measurements: 1.5cm length x 2.5cm width x 0.3cm depth; 0.884cm^3 volume. Character of Wound/Ulcer Post Debridement is improved. Post procedure Diagnosis Wound #1: Same as  Pre-Procedure A Paring/cutting 1 benign hyperkeratotic lesion procedure was performed. by Worthy Keeler, PA. Post procedure Diagnosis Wound #: Same as Pre-Procedure Notes: left plantar foot using #3 curette Plan Follow-up Appointments: Return Appointment in 1 week. Dressing Change Frequency: Wound #1 Left,Plantar Foot: Change dressing every day. Wound Cleansing: Wound #1 Left,Plantar Foot: May shower and wash wound with soap and water. Primary Wound Dressing: Wound #1 Left,Plantar Foot: Calcium Alginate with Silver Secondary Dressing: Wound #1 Left,Plantar Foot: Kerlix/Rolled Gauze Dry Gauze Off-Loading: Open toe surgical shoe to: - left foot 1. My suggestion at this point is can be that we initiate treatment with a silver alginate dressing which I think will help control the moisture about the same time keeping the wound bed moist to allow this area to heal. 2. I am also can recommend at this point that he clean the area with mild soap and water such as Dial antibacterial soap. 3. I am also can recommend currently that the patient continue with utilizing an open toe surgical shoe I think this can be best for helping to prevent any pressure to the wound site. We will see patient back for reevaluation in 1 week here in the clinic. If anything worsens or changes patient will contact our office for additional recommendations. Electronic Signature(s) Signed: 06/09/2019 4:48:04 PM By: Worthy Keeler PA-C Entered By: Worthy Keeler on 06/09/2019 16:48:04 -------------------------------------------------------------------------------- HxROS Details Patient Name: Date of Service: David David 06/09/2019 9:00 AM Medical Record [id number] Patient Account Number: [id number] Date of Birth/Sex: Treating RN: Jan 29, 1951 (69 y.o. David David Primary Care Provider: [name], [name] Other Clinician: Referring Provider: Treating Provider/Extender:Stone III, Hamzeh Tall [name],  [name] in Treatment: 0 Constitutional Symptoms (General Health) Complaints and Symptoms: Negative for: Fatigue; Fever; Chills; Marked Weight Change Ear/Nose/Mouth/Throat Complaints and Symptoms: Negative for: Chronic sinus problems or rhinitis Endocrine Complaints and Symptoms: Negative for: Heat/cold intolerance Medical History: Past Medical History Notes: Hypothyroidism Genitourinary Complaints and Symptoms: Negative for: Frequent urination Integumentary (Skin) Complaints and Symptoms: Positive for: Wounds - wound on left foot Psychiatric Complaints and Symptoms: Negative for: Claustrophobia; Suicidal Eyes Hematologic/Lymphatic Respiratory Medical History: Positive for: Chronic Obstructive Pulmonary Disease (COPD) Cardiovascular Medical History: Positive for: Hypertension; Peripheral Arterial Disease; Peripheral Venous Disease Past Medical History Notes: Hyperlipidemia, TIA Gastrointestinal Medical History: Past Medical History Notes: GERD Immunological Musculoskeletal Medical History: Positive for: Osteoarthritis Neurologic Medical History: Positive for: Neuropathy; Seizure Disorder Oncologic Immunizations Pneumococcal Vaccine: Received Pneumococcal Vaccination: Yes Implantable Devices None Family and Social History Diabetes: Yes - Siblings; Heart Disease: Yes - Father,Mother; Hereditary Spherocytosis: No; Hypertension: No; Kidney Disease: No; Lung Disease: Yes - Siblings; Seizures: No; Stroke: No; Thyroid Problems: No; Tuberculosis: No; Current some day smoker - 1/2 per week; Marital Status - Divorced; Alcohol Use: Rarely; Drug Use: No History; Caffeine Use: Moderate - Coffee, Soda; Financial Concerns: No; Food, Clothing or Shelter Needs: No; Support System Lacking: No; Transportation Concerns: No Electronic Signature(s) Signed: 06/09/2019 5:14:16 PM By: Worthy Keeler PA-C Signed: 06/30/2019 9:02:47 AM By: Levan Hurst RN, BSN Entered By: Levan Hurst on 06/09/2019 09:23:51 -------------------------------------------------------------------------------- [place] Details Patient Name: Date of Service: David David 06/09/2019 Medical Record [id number] Patient Account Number: [id number] Date of Birth/Sex: Treating RN: 04-15-1950 (69 y.o. [name] Primary Care Provider: [name], [name] Other Clinician: Referring Provider: Treating Provider/Extender:Stone  III, Lasandra Batley [name], [name] Weeks in Treatment: 0 Diagnosis Coding ICD-10 Codes Code Description G90.09 Other idiopathic peripheral autonomic neuropathy L97.522 Non-pressure chronic ulcer of other part of left foot with fat layer exposed R73.9 Hyperglycemia, unspecified I73.89 Other specified peripheral vascular diseases J44.9 Chronic obstructive pulmonary disease, unspecified I10 Essential (primary) hypertension Facility Procedures CPT4 Code Description: [id number] 99213 - WOUND CARE VISIT-LEV 3 EST PT Modifier: 25 Quantity: 1 CPT4 Code Description: [id number] 11042 - DEB SUBQ TISSUE 20 SQ CM/< ICD-10 Diagnosis Description ICD-10 Diagnosis Description L97.522 Non-pressure chronic ulcer of other part of left foot with fa Modifier: t layer exp Quantity: 1 osed Physician Procedures CPT4 Code Description: [id number] WC PHYS LEVEL 3 NEW PT ICD-10 Diagnosis Description G90.09 Other idiopathic peripheral autonomic neuropathy L97.522 Non-pressure chronic ulcer of other part of left foot wi R73.9 Hyperglycemia, unspecified I73.89 Other  specified peripheral vascular diseases Modifier: 25 th fat layer ex Quantity: 1 posed CPT4 Code Description: [id number] 45859 - WC PHYS SUBQ TISS 20 SQ CM ICD-10 Diagnosis Description L97.522 Non-pressure chronic ulcer of other part of left foot with Modifier: fat layer expos Quantity: 1 ed Electronic Signature(s) Signed: 06/09/2019 4:48:21 PM By: Worthy Keeler PA-C Entered By: Worthy Keeler on 06/09/2019 16:48:20

## 2019-06-30 NOTE — Progress Notes (Signed)
David David, David David ([id number]) Visit Report for 06/09/2019 Abuse/Suicide Risk Screen Details Patient Name: Date of Service: David David 06/09/2019 9:00 AM Medical Record [id number] Patient Account Number: [id number] Date of Birth/Sex: Treating RN: 30-May-1950 (69 y.o. [name] Primary Care Fallyn Munnerlyn: [name], [name] Other Clinician: Referring Xolani Degracia: Treating Gaynel Schaafsma/Extender:Stone III, [name], [name] in Treatment: 0 Abuse/Suicide Risk Screen Items Answer ABUSE RISK SCREEN: Has anyone close to you tried to hurt or harm you recentlyo No Do you feel uncomfortable with anyone in your familyo No Has anyone forced you do things that you didnt want to doo No Electronic Signature(s) Signed: 06/30/2019 9:02:47 AM By: Levan Hurst RN, BSN Entered By: Levan Hurst on 06/09/2019 09:23:56 -------------------------------------------------------------------------------- Activities of Daily Living Details Patient Name: Date of Service: David David 06/09/2019 9:00 AM Medical Record [id number] Patient Account Number: [id number] Date of Birth/Sex: Treating RN: [birth year] (69 y.o. [name] Primary Care Christena Sunderlin: [name], [name] Other Clinician: Referring Taylin Mans: Treating Greyson Peavy/Extender:Stone III, [name], [name] in Treatment: 0 Activities of Daily Living Items Answer Activities of Daily Living (Please select one for each item) Drive Automobile Completely Able Take Medications Completely Able Use Telephone Completely Able Care for Appearance Completely Able Use Toilet Completely Able Bath / Shower Completely Able Dress Self Completely Able Feed Self Completely Able Walk Completely Able Get In / Out Bed Completely Able Housework Completely Able Prepare Meals Completely [place] for Self Completely Able Electronic Signature(s) Signed: 06/30/2019 9:02:47 AM By: Levan Hurst RN, BSN Entered By:  Levan Hurst on 06/09/2019 09:24:11 -------------------------------------------------------------------------------- Education Screening Details Patient Name: Date of Service: David David 06/09/2019 9:00 AM Medical Record [id number] Patient Account Number: [id number] Date of Birth/Sex: Treating RN: [birth year] (69 y.o. [name] Primary Care Carmin Alvidrez: [name], [name] Other Clinician: Referring Davene Jobin: Treating Sonam Huelsmann/Extender:Stone III, [name], [name] in Treatment: 0 Primary Learner Assessed: Patient Learning Preferences/Education Level/Primary Language Learning Preference: Explanation, Demonstration, Printed Material Highest Education Level: College or Above Preferred Language: English Cognitive Barrier Language Barrier: No Translator Needed: No Memory Deficit: No Emotional Barrier: No Cultural/Religious Beliefs Affecting Medical Care: No Physical Barrier Impaired Vision: No Impaired Hearing: No Decreased Hand dexterity: No Knowledge/Comprehension Knowledge Level: High Comprehension Level: High Ability to understand written High instructions: Ability to understand verbal High instructions: Motivation Anxiety Level: Calm Cooperation: Cooperative Education Importance: Acknowledges Need Interest in Health Problems: Asks Questions Perception: Coherent Willingness to Engage in Self- High Management Activities: Readiness to Engage in Self- High Management Activities: Electronic Signature(s) Signed: 06/30/2019 9:02:47 AM By: Levan Hurst RN, BSN Entered By: Levan Hurst on 06/09/2019 09:24:30 -------------------------------------------------------------------------------- Fall Risk Assessment Details Patient Name: Date of Service: David David. 06/09/2019 9:00 AM Medical Record [id number] Patient Account Number: [id number] Date of Birth/Sex: Treating RN: 12-25-1950 (69 y.o. [name] Primary Care Davene Jobin:  [name], [name] Other Clinician: Referring Davien Malone: Treating Ola Fawver/Extender:Stone III, [name], [name] in Treatment: 0 Fall Risk Assessment Items Have you had 2 or more falls in the last 12 monthso 0 No Have you had any fall that resulted in injury in the last 12 monthso 0 No FALLS RISK SCREEN History of falling - immediate or within 3 months 0 No Secondary diagnosis (Do you have 2 or more medical diagnoseso) 15 Yes Ambulatory aid None/bed rest/wheelchair/nurse 0 Yes Crutches/cane/walker 0 No Furniture 0 No Intravenous therapy Access/Saline/Heparin Lock 0 No Weak (short steps with or without shuffle, stooped but able to lift head 0 No while walking, may seek  support from furniture) Impaired (short steps with shuffle, may have difficulty arising from chair, 0 No head down, impaired balance) Mental Status Oriented to own ability 0 Yes Overestimates or forgets limitations 0 No Risk Level: Low Risk Score: 15 Electronic Signature(s) Signed: 06/30/2019 9:02:47 AM By: Levan Hurst RN, BSN Entered By: Levan Hurst on 06/09/2019 09:24:44 -------------------------------------------------------------------------------- Foot Assessment Details Patient Name: Date of Service: David David. 06/09/2019 9:00 AM Medical Record [id number] Patient Account Number: [id number] Date of Birth/Sex: Treating RN: 06/13/1950 (69 y.o. [name] Primary Care Tysha Grismore: [name], [name] Other Clinician: Referring Julanne Schlueter: Treating Azeem Poorman/Extender:Stone III, [name], [name] in Treatment: 0 Foot Assessment Items Site Locations + = Sensation present, - = Sensation absent, C = Callus, U = Ulcer R = Redness, W = Warmth, M = Maceration, PU = Pre-ulcerative lesion F = Fissure, S = Swelling, D = Dryness Assessment Right: Left: Other Deformity: No No Prior Foot Ulcer: No No Prior Amputation: No No Charcot Joint: No No Ambulatory Status: Ambulatory Without Help Gait:  Steady Electronic Signature(s) Signed: 06/30/2019 9:02:47 AM By: Levan Hurst RN, BSN Entered By: Levan Hurst on 06/09/2019 09:26:28 -------------------------------------------------------------------------------- Nutrition Risk Screening Details Patient Name: Date of Service: David David 06/09/2019 9:00 AM Medical Record [id number] Patient Account Number: [id number] Date of Birth/Sex: Treating RN: [birth year] (69 y.o. [name] Primary Care Joelle Roswell: [name], [name] Other Clinician: Referring Chales Pelissier: Treating Nimai Burbach/Extender:Stone III, [name], [name] in Treatment: 0 Height (in): 75 Weight (lbs): 185 Body Mass Index (BMI): 23.1 Nutrition Risk Screening Items Score Screening NUTRITION RISK SCREEN: I have an illness or condition that made me change the kind and/or 0 No amount of food I eat I eat fewer than two meals per day 0 No I eat few fruits and vegetables, or milk products 0 No I have three or more drinks of beer, liquor or wine almost every day 0 No I have tooth or mouth problems that make it hard for me to eat 0 No I don't always have enough money to buy the food I need 0 No I eat alone most of the time 0 No I take three or more different prescribed or over-the-counter drugs a day 1 Yes 0 No Without wanting to, I have lost or gained 10 pounds in the last six months I am not always physically able to shop, cook and/or feed myself 0 No Nutrition Protocols Good Risk Protocol Provide education on Moderate Risk Protocol 0 nutrition High Risk Proctocol Risk Level: Good Risk Score: 1 Electronic Signature(s) Signed: 06/30/2019 9:02:47 AM By: Levan Hurst RN, BSN Entered By: Levan Hurst on 06/09/2019 09:24:52

## 2019-06-30 NOTE — Progress Notes (Signed)
David David, David David ([id number]) Visit Report for 06/09/2019 Allergy List Details Patient Name: Date of Service: David David, David David 06/09/2019 9:00 AM Medical Record [id number] Patient Account Number: [id number] Date of Birth/Sex: Treating RN: [birth year] (69 y.o. David David Primary Care David David: David David Other Clinician: Referring Hazleigh Mccleave: Treating [name]/Extender:Stone III, David David, David David in Treatment: 0 Allergies Active Allergies adhesive tape Reaction: hypersensitivity Polysporin Reaction: rash Severity: Moderate paroxetine Reaction: lethargy Severity: Moderate Allergy Notes Electronic Signature(s) Signed: 06/30/2019 9:02:47 AM By: Levan Hurst RN, BSN Entered By: Levan Hurst on 06/09/2019 09:08:11 -------------------------------------------------------------------------------- Arrival Information Details Patient Name: Date of Service: David David. 06/09/2019 9:00 AM Medical Record [id number] Patient Account Number: [id number] Date of Birth/Sex: Treating RN: 25-Jun-1950 (69 y.o. David David Primary Care David David: David David Other Clinician: Referring Corrine Tillis: Treating Anuradha Chabot/Extender:Stone III, David David, David David in Treatment: 0 Visit Information Patient Arrived: Ambulatory Arrival Time: 08:59 Accompanied By: alone Transfer Assistance: None Patient Identification Verified: Yes Secondary Verification Process Yes Completed: Patient Requires Transmission-Based No Precautions: Patient Has Alerts: Yes Patient Alerts: L ABI = 1.23 (03/2019) Electronic Signature(s) Signed: 06/30/2019 9:02:47 AM By: Levan Hurst RN, BSN Entered By: Levan Hurst on 06/09/2019 09:17:42 -------------------------------------------------------------------------------- Clinic Level of Care Assessment Details Patient Name: Date of Service: David David, David David 06/09/2019 9:00 AM Medical Record [id number] Patient Account  Number: [id number] Date of Birth/Sex: Treating RN: [birth year] (69 y.o. David David Primary Care David David: David David Other Clinician: Referring Dontravious Camille: Treating Brylin Stopper/Extender:Stone III, David David, David David in Treatment: 0 Clinic Level of Care Assessment Items TOOL 1 Quantity Score []  - Use when EandM and Procedure is performed on INITIAL visit 0 ASSESSMENTS - Nursing Assessment / Reassessment X - General Physical Exam (combine w/ comprehensive assessment (listed just below) 1 20 when performed on new pt. evals) X - Comprehensive Assessment (HX, ROS, Risk Assessments, Wounds Hx, etc.) 1 25 ASSESSMENTS - Wound and Skin Assessment / Reassessment []  - Dermatologic / Skin Assessment (not related to wound area) 0 ASSESSMENTS - Ostomy and/or Continence Assessment and Care []  - Incontinence Assessment and Management 0 []  - Ostomy Care Assessment and Management (repouching, etc.) 0 PROCESS - Coordination of Care X - Simple Patient / Family Education for ongoing care 1 15 []  - Complex (extensive) Patient / Family Education for ongoing care 0 X - Staff obtains Programmer, systems, Records, Test Results / Process Orders 1 10 []  - Staff telephones HHA, Nursing Homes / Clarify orders / etc 0 []  - Routine Transfer to another Facility (non-emergent condition) 0 []  - Routine Hospital Admission (non-emergent condition) 0 X - New Admissions / Biomedical engineer / Ordering NPWT, Apligraf, etc. 1 15 []  - Emergency Hospital Admission (emergent condition) 0 PROCESS - Special Needs []  - Pediatric / Minor Patient Management 0 []  - Isolation Patient Management 0 []  - Hearing / Language / Visual special needs 0 []  - Assessment of Community assistance (transportation, D/C planning, etc.) 0 []  - Additional assistance / Altered mentation 0 []  - Support Surface(s) Assessment (bed, cushion, seat, etc.) 0 INTERVENTIONS - Miscellaneous []  - External ear exam 0 []  - Patient Transfer (multiple  staff / Civil Service fast streamer / Similar devices) 0 []  - Simple Staple / Suture removal (25 or less) 0 []  - Complex Staple / Suture removal (26 or more) 0 []  - Hypo/Hyperglycemic Management (do not check if billed separately) 0 []  - Ankle / Brachial Index (ABI) - do not check if billed separately 0 Has the patient been seen at the hospital within  the last three years: Yes Total Score: 85 Level Of Care: New/Established - Level 3 Electronic Signature(s) Signed: 06/09/2019 5:36:50 PM By: Baruch Gouty RN, BSN Entered By: Baruch Gouty on 06/09/2019 10:10:39 -------------------------------------------------------------------------------- Encounter Discharge Information Details Patient Name: Date of Service: David David. 06/09/2019 9:00 AM Medical Record [id number] Patient Account Number: [id number] Date of Birth/Sex: Treating RN: 08/29/1950 (68 y.o. David David Primary Care [name]: David David Other Clinician: Referring Kemper Heupel: Treating Jalaila Caradonna/Extender:Stone III, David David, David David in Treatment: 0 Encounter Discharge Information Items Post Procedure Vitals Discharge Condition: Stable Temperature (F): 98.2 Ambulatory Status: Ambulatory Pulse (bpm): 66 Discharge Destination: Home Respiratory Rate (breaths/min): 18 Transportation: Private Auto Blood Pressure (mmHg): 136/69 Accompanied By: self Schedule Follow-up Appointment: Yes Clinical Summary of Care: Patient Declined Electronic Signature(s) Signed: 06/09/2019 5:15:50 PM By: Carlene Coria RN Entered By: Carlene Coria on 06/09/2019 10:33:20 -------------------------------------------------------------------------------- Lower Extremity Assessment Details Patient Name: Date of Service: David David, David David 06/09/2019 9:00 AM Medical Record [id number] Patient Account Number: [id number] Date of Birth/Sex: Treating RN: 03-Jan-1951 (69 y.o. David David Primary Care Jemar Paulsen: David David Other  Clinician: Referring Boluwatife Mutchler: Treating Zadin Lange/Extender:Stone III, David David, David David in Treatment: 0 Edema Assessment Assessed: [Left: No] [Right: No] Edema: [Left: N] [Right: o] Calf Left: Right: Point of Measurement: 30 cm From Medial Instep 30 cm cm Ankle Left: Right: Point of Measurement: 11 cm From Medial Instep 20.6 cm cm Vascular Assessment Pulses: Dorsalis Pedis Palpable: [Left:Yes] Electronic Signature(s) Signed: 06/30/2019 9:02:47 AM By: Levan Hurst RN, BSN Entered By: Levan Hurst on 06/09/2019 09:26:44 -------------------------------------------------------------------------------- [place] Details Patient Name: Date of Service: David David. 06/09/2019 9:00 AM Medical Record [id number] Patient Account Number: [id number] Date of Birth/Sex: Treating RN: 07-Aug-1950 (69 y.o. David David Primary Care Dannie Woolen: David David Other Clinician: Referring Prem Coykendall: Treating Darrol Brandenburg/Extender:Stone III, David David, David David in Treatment: 0 Active Inactive Peripheral Neuropathy Nursing Diagnoses: Knowledge deficit related to disease process and management of peripheral neurovascular dysfunction Potential alteration in peripheral tissue perfusion (select prior to confirmation of diagnosis) Goals: Patient/caregiver will verbalize understanding of disease process and disease management Date Initiated: 06/09/2019 Target Resolution Date: 07/07/2019 Goal Status: Active Interventions: Assess signs and symptoms of neuropathy upon admission and as needed Provide education on Management of Neuropathy and Related Ulcers Notes: Wound/Skin Impairment Nursing Diagnoses: Impaired tissue integrity Knowledge deficit related to smoking impact on wound healing Knowledge deficit related to ulceration/compromised skin integrity Goals: Patient will demonstrate a reduced rate of smoking or cessation of smoking Date Initiated:  06/09/2019 Target Resolution Date: 07/07/2019 Goal Status: Active Patient/caregiver will verbalize understanding of skin care regimen Date Initiated: 06/09/2019 Target Resolution Date: 07/07/2019 Goal Status: Active Ulcer/skin breakdown will have a volume reduction of 30% by week 4 Date Initiated: 06/09/2019 Target Resolution Date: 07/07/2019 Goal Status: Active Interventions: Assess patient/caregiver ability to obtain necessary supplies Assess patient/caregiver ability to perform ulcer/skin care regimen upon admission and as needed Assess ulceration(s) every visit Provide education on smoking Provide education on ulcer and skin care Treatment Activities: Skin care regimen initiated : 06/09/2019 Topical wound management initiated : 06/09/2019 Notes: Electronic Signature(s) Signed: 06/09/2019 5:36:50 PM By: Baruch Gouty RN, BSN Entered By: Baruch Gouty on 06/09/2019 10:07:45 -------------------------------------------------------------------------------- Pain Assessment Details Patient Name: Date of Service: David David 06/09/2019 9:00 AM Medical Record [id number] Patient Account Number: [id number] Date of Birth/Sex: Treating RN: 10-07-1950 (69 y.o. David David Primary Care Margreat Widener: David David Other Clinician: Referring Abia Monaco: Treating Linnaea Ahn/Extender:Stone III, David David, David David in  Treatment: 0 Active Problems Location of Pain Severity and Description of Pain Patient Has Paino No Site Locations Pain Management and Medication Current Pain Management: Electronic Signature(s) Signed: 06/30/2019 9:02:47 AM By: Levan Hurst RN, BSN Entered By: Levan Hurst on 06/09/2019 09:28:33 -------------------------------------------------------------------------------- Patient/Caregiver Education Details Patient Name: David David 3/24/2021andnbsp9:00 Date of Service: AM Medical Record AM Medical Record [id number] Number: Patient Account  Number: [id number] Treating RN: 1951-01-13 (69 y.o. Baruch Gouty Date of Birth/Gender: M) Other Clinician: Primary Care Physician: David David Treating Worthy Keeler Referring Physician: Physician/Extender: David David Weeks in Treatment: 0 Education Assessment Education Provided To: Patient Education Topics Provided Peripheral Neuropathy: Handouts: General Foot Care, Neuropathy Methods: Explain/Verbal, Printed Responses: Reinforcements needed, State content correctly Welcome [facility]: Handouts: Welcome [facility] Methods: Explain/Verbal, Printed Responses: Reinforcements needed, State content correctly Wound/Skin Impairment: Handouts: Caring for Your Ulcer, Skin Care Do's and Dont's, Smoking and Wound Healing Methods: Explain/Verbal, Printed Responses: Reinforcements needed, State content correctly Electronic Signature(s) Signed: 06/09/2019 5:36:50 PM By: Baruch Gouty RN, BSN Entered By: Baruch Gouty on 06/09/2019 10:08:43 -------------------------------------------------------------------------------- Wound Assessment Details Patient Name: Date of Service: David David. 06/09/2019 9:00 AM Medical Record [id number] Patient Account Number: [id number] Date of Birth/Sex: Treating RN: 22-Jun-1950 (69 y.o. David David Primary Care Clothilde Tippetts: David David Other Clinician: Referring Arian Mcquitty: Treating Linsy Ehresman/Extender:Stone III, David David, David David in Treatment: 0 Wound Status Wound Number: 1 Primary Neuropathic Ulcer-Non Diabetic Etiology: Wound Location: Left Foot - Plantar Wound Open Wounding Event: Blister Status: Date Acquired: 05/17/2019 Comorbid Chronic Obstructive Pulmonary Disease Weeks Of Treatment: 0 History: (COPD), Hypertension, Peripheral Arterial Clustered Wound: No Disease, Peripheral Venous Disease, Osteoarthritis, Neuropathy, Seizure Disorder Photos Wound Measurements Length: (cm) 1.5 %  Reduct Width: (cm) 2.5 % Reduct Depth: (cm) 0.3 Epitheli Area: (cm) 2.945 Tunneli Volume: (cm) 0.884 Undermi Wound Description Full Thickness Without Exposed Support Foul Od Classification: Structures Slough/ Wound Flat and Intact Margin: Exudate Medium Amount: Exudate Serosanguineous Type: Exudate red, brown Color: Wound Bed Granulation Amount: Medium (34-66%) Granulation Quality: Pink Fascia E Necrotic Amount: Medium (34-66%) Fat Laye Necrotic Quality: Adherent Slough Tendon E Muscle E Joint Ex Bone Exp or After Cleansing: No Fibrino Yes Exposed Structure xposed: No r (Subcutaneous Tissue) Exposed: Yes xposed: No xposed: No posed: No osed: No ion in Area: 0% ion in Volume: 0% alization: None ng: No ning: No Electronic Signature(s) Signed: 06/11/2019 4:01:45 PM By: Mikeal Hawthorne EMT/HBOT Signed: 06/30/2019 9:02:47 AM By: Levan Hurst RN, BSN Entered By: Mikeal Hawthorne on 06/11/2019 14:39:52 -------------------------------------------------------------------------------- Vitals Details Patient Name: Date of Service: David David. 06/09/2019 9:00 AM Medical Record [id number] Patient Account Number: [id number] Date of Birth/Sex: Treating RN: [birth year] (69 y.o. David David Primary Care Ambrea Hegler: David David Other Clinician: Referring Lindia Garms: Treating Codie Krogh/Extender:Stone III, David David, David David in Treatment: 0 Vital Signs Time Taken: 09:06 Temperature (F): 98.2 Height (in): 75 Pulse (bpm): 66 Source: Stated Respiratory Rate (breaths/min): 18 Weight (lbs): 185 Blood Pressure (mmHg): 136/69 Source: Stated Reference Range: 80 - 120 mg / dl Body Mass Index (BMI): 23.1 Electronic Signature(s) Signed: 06/30/2019 9:02:47 AM By: Levan Hurst RN, BSN Entered By: Levan Hurst on 06/09/2019 09:06:44

## 2019-07-14 ENCOUNTER — Encounter (HOSPITAL_BASED_OUTPATIENT_CLINIC_OR_DEPARTMENT_OTHER): Payer: Medicare Other | Admitting: Physician Assistant

## 2019-07-14 ENCOUNTER — Other Ambulatory Visit: Payer: Self-pay

## 2019-07-14 DIAGNOSIS — L97522 Non-pressure chronic ulcer of other part of left foot with fat layer exposed: Secondary | ICD-10-CM | POA: Diagnosis not present

## 2019-07-14 DIAGNOSIS — Z95828 Presence of other vascular implants and grafts: Secondary | ICD-10-CM | POA: Diagnosis not present

## 2019-07-14 DIAGNOSIS — R739 Hyperglycemia, unspecified: Secondary | ICD-10-CM | POA: Diagnosis not present

## 2019-07-14 DIAGNOSIS — G9009 Other idiopathic peripheral autonomic neuropathy: Secondary | ICD-10-CM | POA: Diagnosis not present

## 2019-07-14 DIAGNOSIS — I7389 Other specified peripheral vascular diseases: Secondary | ICD-10-CM | POA: Diagnosis not present

## 2019-07-14 NOTE — Progress Notes (Addendum)
David David, David David ([id number]) Visit Report for 07/14/2019 Chief Complaint Document Details Patient Name: Date of Service: David David MES P. 07/14/2019 8:45 A M Medical Record Number: [id number] Patient Account Number: [id number] Date of Birth/Sex: Treating RN: [birth year] (69 y.o. David David Primary Care Provider: [name], [name] Other Clinician: Referring Provider: Treating Provider/Extender: Stone III, Lynsee Wands [name], [name] Weeks in Treatment: 5 Information Obtained from: Patient Chief Complaint Left plantar foot ulcer Electronic Signature(s) Signed: 07/14/2019 9:10:56 AM By: Worthy Keeler PA-C Entered By: Worthy Keeler on 07/14/2019 09:10:55 -------------------------------------------------------------------------------- HPI Details Patient Name: Date of Service: [facility], JA MES P. 07/14/2019 8:45 A M Medical Record Number: [id number] Patient Account Number: [id number] Date of Birth/Sex: Treating RN: 1950-08-16 (69 y.o. David David Primary Care Provider: [name], [name] Other Clinician: Referring Provider: Treating Provider/Extender: Stone III, Davonne Jarnigan [name], [name] Weeks in Treatment: 5 History of Present Illness HPI Description: 06/09/2019 upon evaluation today patient presents for initial inspection here in the clinic concerning issues he has been having with his plantar foot distally at right at the base of his toes centrally. He tells me this started with a blister back in September of last year he has had some issues following and more recently he has had trouble with blistering at the base of his toes. Finally a week ago it got to the point that he decided to going to be seen. He was seen at [facility] last week he was given Keflex and doxycycline I recommended to follow-up with wound care. Fortunately there is no signs of active infection at this time. No fevers, chills, nausea, vomiting, or diarrhea. The patient has not confirmed diabetic although he did  have an elevated blood glucose level in the hospital when he was seen last week. He does have neuropathy considered to be idiopathic at this point. He also has peripheral vascular disease has had a previous stent to the left lower extremity he also had recent ABIs which measured at 1.23 2021. Subsequently he does have COPD and hypertension as well. He notes that the initial blistering actually occurred as a result of the patient walking outside without shoes on in September when it was much too hot he could not feel and incidentally burned his feet. 06/16/2019 upon evaluation today patient appears to be doing well with regard to his foot ulcer. He has been tolerating the dressing changes with the alginate. Things do seem to be drying up which is great news. Fortunately there is no signs of active infection at this time. No fevers, chills, nausea, vomiting, or diarrhea. 06/23/19 upon evaluation today patient appears to be doing excellent in regard to his foot ulcer. He has been tolerating the dressing changes with the alginate without complication. The wound actually appears to be somewhat dry in the wound bed. Fortunately there is no sign of active infection at this time. No fevers, chills, nausea, vomiting, or diarrhea. 06/30/2019 upon evaluation today patient appears to be doing excellent in regard to his foot ulcer. He has been tolerating the dressing changes without complication. Fortunately there is no signs of active infection at this time which is great news. Overall I am very pleased with how things seem to be progressing currently. The wound bed appears to be much more moist and healthy even compared to last week I think this is good to help him to heal that much more quickly. 07/14/2019 upon evaluation today patient appears to be doing excellent in regard to his wound currently.  He had a lot of callus buildup which was hardened around the wound area but it did not appear to actually be open. I  therefore did perform some callus paring to clear this away to help him not be uncomfortable with walking he tolerated that today without complication post paring the wound appears to be healed and overall the tissue appears to be much healthier. Electronic Signature(s) Signed: 07/14/2019 9:29:49 AM By: Worthy Keeler PA-C Previous Signature: 07/14/2019 9:16:20 AM Version By: Worthy Keeler PA-C Entered By: Worthy Keeler on 07/14/2019 09:29:49 -------------------------------------------------------------------------------- Paring/cutting 1 benign hyperkeratotic lesion Details Patient Name: Date of Service: David David MES P. 07/14/2019 8:45 A M Medical Record Number: [id number] Patient Account Number: [id number] Date of Birth/Sex: Treating RN: 1950/09/27 (69 y.o. David David Primary Care Provider: [name], [name] Other Clinician: Referring Provider: Treating Provider/Extender: Stone III, Kare Dado [name], [name] Weeks in Treatment: 5 Procedure Performed for: Non-Wound Location Performed By: Physician Worthy Keeler, PA Post Procedure Diagnosis Same as Pre-procedure Notes callous on left plantar foot Electronic Signature(s) Signed: 07/14/2019 5:55:37 PM By: Levan Hurst RN, BSN Signed: 07/16/2019 9:02:44 AM By: Worthy Keeler PA-C Entered By: Levan Hurst on 07/14/2019 09:24:09 -------------------------------------------------------------------------------- Physical Exam Details Patient Name: Date of Service: David David, David David Brandy MES P. 07/14/2019 8:45 A M Medical Record Number: [id number] Patient Account Number: [id number] Date of Birth/Sex: Treating RN: 09/13/1950 (69 y.o. David David Primary Care Provider: [name], [name] Other Clinician: Referring Provider: Treating Provider/Extender: Stone III, Anubis Fundora [name], [name] Weeks in Treatment: 5 Constitutional Well-nourished and well-hydrated in no acute distress. Respiratory normal breathing without difficulty. Psychiatric this  patient is able to make decisions and demonstrates good insight into disease process. Alert and Oriented x 3. pleasant and cooperative. Notes Patient's wound appears to be completely epithelialized today which is great news. Electronic Signature(s) Signed: 07/14/2019 9:30:03 AM By: Worthy Keeler PA-C Entered By: Worthy Keeler on 07/14/2019 09:30:03 -------------------------------------------------------------------------------- Physician Orders Details Patient Name: Date of Service: [facility], JA MES P. 07/14/2019 8:45 A M Medical Record Number: [id number] Patient Account Number: [id number] Date of Birth/Sex: Treating RN: December 17, 1950 (68 y.o. David David Primary Care Provider: [name], [name] Other Clinician: Referring Provider: Treating Provider/Extender: Stone III, Alfred Eckley [name], [name] Weeks in Treatment: 5 Verbal / Phone Orders: No Diagnosis Coding ICD-10 Coding Code Description G90.09 Other idiopathic peripheral autonomic neuropathy L97.522 Non-pressure chronic ulcer of other part of left foot with fat layer exposed R73.9 Hyperglycemia, unspecified I73.89 Other specified peripheral vascular diseases J44.9 Chronic obstructive pulmonary disease, unspecified I10 Essential (primary) hypertension Discharge From [facility] Discharge from [facility] Primary Wound Dressing Other: - apply neosporin and dry bandage Electronic Signature(s) Signed: 07/14/2019 5:55:37 PM By: Levan Hurst RN, BSN Signed: 07/16/2019 9:02:44 AM By: Worthy Keeler PA-C Entered By: Levan Hurst on 07/14/2019 09:27:49 -------------------------------------------------------------------------------- Problem List Details Patient Name: Date of Service: David David, JA MES P. 07/14/2019 8:45 A M Medical Record Number: [id number] Patient Account Number: [id number] Date of Birth/Sex: Treating RN: Aug 28, 1950 (69 y.o. David David Primary Care Provider: [name], [name] Other Clinician: Referring  Provider: Treating Provider/Extender: Stone III, Adalay Azucena [name], [name] Weeks in Treatment: 5 Active Problems ICD-10 Encounter Code Description Active Date MDM Diagnosis G90.09 Other idiopathic peripheral autonomic neuropathy 06/09/2019 No Yes L97.522 Non-pressure chronic ulcer of other part of left foot with fat layer exposed 06/09/2019 No Yes R73.9 Hyperglycemia, unspecified 06/09/2019 No Yes I73.89 Other specified peripheral vascular diseases 06/09/2019 No Yes J44.9 Chronic obstructive pulmonary disease,  unspecified 06/09/2019 No Yes I10 Essential (primary) hypertension 06/09/2019 No Yes Inactive Problems Resolved Problems Electronic Signature(s) Signed: 07/14/2019 9:10:51 AM By: Worthy Keeler PA-C Entered By: Worthy Keeler on 07/14/2019 09:10:51 -------------------------------------------------------------------------------- Progress Note Details Patient Name: Date of Service: [facility], JA MES P. 07/14/2019 8:45 A M Medical Record Number: [id number] Patient Account Number: [id number] Date of Birth/Sex: Treating RN: 07/20/1950 (69 y.o. David David Primary Care Provider: [name], [name] Other Clinician: Referring Provider: Treating Provider/Extender: Stone III, Tawsha Terrero [name], [name] Weeks in Treatment: 5 Subjective Chief Complaint Information obtained from Patient Left plantar foot ulcer History of Present Illness (HPI) 06/09/2019 upon evaluation today patient presents for initial inspection here in the clinic concerning issues he has been having with his plantar foot distally at right at the base of his toes centrally. He tells me this started with a blister back in September of last year he has had some issues following and more recently he has had trouble with blistering at the base of his toes. Finally a week ago it got to the point that he decided to going to be seen. He was seen at [facility] last week he was given Keflex and doxycycline I recommended to follow-up with  wound care. Fortunately there is no signs of active infection at this time. No fevers, chills, nausea, vomiting, or diarrhea. The patient has not confirmed diabetic although he did have an elevated blood glucose level in the hospital when he was seen last week. He does have neuropathy considered to be idiopathic at this point. He also has peripheral vascular disease has had a previous stent to the left lower extremity he also had recent ABIs which measured at 1.23 2021. Subsequently he does have COPD and hypertension as well. He notes that the initial blistering actually occurred as a result of the patient walking outside without shoes on in September when it was much too hot he could not feel and incidentally burned his feet. 06/16/2019 upon evaluation today patient appears to be doing well with regard to his foot ulcer. He has been tolerating the dressing changes with the alginate. Things do seem to be drying up which is great news. Fortunately there is no signs of active infection at this time. No fevers, chills, nausea, vomiting, or diarrhea. 06/23/19 upon evaluation today patient appears to be doing excellent in regard to his foot ulcer. He has been tolerating the dressing changes with the alginate without complication. The wound actually appears to be somewhat dry in the wound bed. Fortunately there is no sign of active infection at this time. No fevers, chills, nausea, vomiting, or diarrhea. 06/30/2019 upon evaluation today patient appears to be doing excellent in regard to his foot ulcer. He has been tolerating the dressing changes without complication. Fortunately there is no signs of active infection at this time which is great news. Overall I am very pleased with how things seem to be progressing currently. The wound bed appears to be much more moist and healthy even compared to last week I think this is good to help him to heal that much more quickly. 07/14/2019 upon evaluation today patient  appears to be doing excellent in regard to his wound currently. He had a lot of callus buildup which was hardened around the wound area but it did not appear to actually be open. I therefore did perform some callus paring to clear this away to help him not be uncomfortable with walking he tolerated that today without complication  post paring the wound appears to be healed and overall the tissue appears to be much healthier. Objective Constitutional Well-nourished and well-hydrated in no acute distress. Vitals Time Taken: 8:42 AM, Weight: 185 lbs, Temperature: 98.2 F, Pulse: 84 bpm, Respiratory Rate: 18 breaths/min, Blood Pressure: 149/79 mmHg. Respiratory normal breathing without difficulty. Psychiatric this patient is able to make decisions and demonstrates good insight into disease process. Alert and Oriented x 3. pleasant and cooperative. General Notes: Patient's wound appears to be completely epithelialized today which is great news. Integumentary (Hair, Skin) Wound #1 status is Healed - Epithelialized. Original cause of wound was Blister. The wound is located on the [place]. The wound measures 0cm length x 0cm width x 0cm depth; 0cm^2 area and 0cm^3 volume. There is no tunneling or undermining noted. There is a none present amount of drainage noted. The wound margin is flat and intact. There is no granulation within the wound bed. There is no necrotic tissue within the wound bed. General Notes: callus over wound bed. Assessment Active Problems ICD-10 Other idiopathic peripheral autonomic neuropathy Non-pressure chronic ulcer of other part of left foot with fat layer exposed Hyperglycemia, unspecified Other specified peripheral vascular diseases Chronic obstructive pulmonary disease, unspecified Essential (primary) hypertension Procedures A Paring/cutting 1 benign hyperkeratotic lesion procedure was performed. by Worthy Keeler, PA. Post procedure Diagnosis Wound #: Same  as Pre-Procedure Notes: callous on left plantar foot Plan Discharge From [facility]: Discharge from [facility] Primary Wound Dressing: Other: - apply neosporin and dry bandage 1. I would recommend currently that we discontinue wound care services as the patient appears to be completely healed. 2. I would recommend some Neosporin and just a small bandage over the area to keep this area moistened while it continues to toughen up and heal effectively. Again even once there is new skin covering it still take some time for the wound to completely resolve and get back to more normal tissue. Follow-up as needed. Electronic Signature(s) Signed: 07/14/2019 9:32:40 AM By: Worthy Keeler PA-C Entered By: Worthy Keeler on 07/14/2019 09:32:40 -------------------------------------------------------------------------------- SuperBill Details Patient Name: Date of Service: [facility], JA MES P. 07/14/2019 Medical Record Number: [id number] Patient Account Number: [id number] Date of Birth/Sex: Treating RN: June 21, 1950 (69 y.o. David David Primary Care Provider: [name], [name] Other Clinician: Referring Provider: Treating Provider/Extender: Stone III, Caine Barfield [name], [name] Weeks in Treatment: 5 Diagnosis Coding ICD-10 Codes Code Description G90.09 Other idiopathic peripheral autonomic neuropathy L97.522 Non-pressure chronic ulcer of other part of left foot with fat layer exposed R73.9 Hyperglycemia, unspecified I73.89 Other specified peripheral vascular diseases J44.9 Chronic obstructive pulmonary disease, unspecified I10 Essential (primary) hypertension Facility Procedures CPT4 Code: [id number] Description: 99213 - WOUND CARE VISIT-LEV 3 EST PT Modifier: Quantity: 1 Physician Procedures : CPT4 Code Description Modifier [id number] - WC PHYS LEVEL 3 - EST PT ICD-10 Diagnosis Description G90.09 Other idiopathic peripheral autonomic neuropathy L97.522 Non-pressure chronic ulcer of  other part of left foot with fat layer exposed R73.9  Hyperglycemia, unspecified I73.89 Other specified peripheral vascular diseases Quantity: 1 Electronic Signature(s) Signed: 07/15/2019 5:31:56 PM By: Levan Hurst RN, BSN Signed: 07/16/2019 9:02:44 AM By: Worthy Keeler PA-C Previous Signature: 07/14/2019 9:32:54 AM Version By: Worthy Keeler PA-C Entered By: Levan Hurst on 07/15/2019 10:20:07

## 2019-07-14 NOTE — Progress Notes (Addendum)
YU, PEGGS ([id number]) Visit Report for 07/14/2019 Arrival Information Details Patient Name: Date of Service: David David David P. 07/14/2019 8:45 A M Medical Record Number: [id number] Patient Account Number: [id number] Date of Birth/Sex: Treating RN: March 12, 1951 (69 y.o. David David Primary Care Cyndel Griffey: [name], [name] Other Clinician: Referring Sava Proby: Treating Hiilani Jetter/Extender: Stone III, [name], [name] Weeks in Treatment: 5 Visit Information History Since Last Visit Added or deleted any medications: No Patient Arrived: Ambulatory Any new allergies or adverse reactions: No Arrival Time: 08:40 Had a fall or experienced change in No Accompanied By: self activities of daily living that may affect Transfer Assistance: None risk of falls: Patient Identification Verified: Yes Signs or symptoms of abuse/neglect since No Secondary Verification Process Completed: Yes last visito Patient Requires Transmission-Based Precautions: No Hospitalized since last visit: No Patient Has Alerts: Yes Implantable device outside of the clinic No Patient Alerts: L ABI = 1.23 (03/2019) excluding cellular tissue based products placed in the center since last visit: Has Dressing in Place as Prescribed: Yes Has Footwear/Offloading in Place as Yes Prescribed: Right: Surgical Shoe with Pressure Relief Insole Pain Present Now: No Electronic Signature(s) Signed: 07/14/2019 5:12:26 PM By: Deon Pilling Entered By: Deon Pilling on 07/14/2019 08:43:17 -------------------------------------------------------------------------------- Clinic Level of Care Assessment Details Patient Name: Date of Service: [name] David P. 07/14/2019 8:45 A M Medical Record Number: [id number] Patient Account Number: [id number] Date of Birth/Sex: Treating RN: May 18, 1950 (69 y.o. David David Primary Care Candas Deemer: [name], [name] Other Clinician: Referring Chenel Wernli: Treating Tiearra Colwell/Extender: Stone III,  [name], [name] Weeks in Treatment: 5 Clinic Level of Care Assessment Items TOOL 4 Quantity Score X- 1 0 Use when only an EandM is performed on FOLLOW-UP visit ASSESSMENTS - Nursing Assessment / Reassessment X- 1 10 Reassessment of Co-morbidities (includes updates in patient status) X- 1 5 Reassessment of Adherence to Treatment Plan ASSESSMENTS - Wound and Skin A ssessment / Reassessment X - Simple Wound Assessment / Reassessment - one wound 1 5 []  - 0 Complex Wound Assessment / Reassessment - multiple wounds []  - 0 Dermatologic / Skin Assessment (not related to wound area) ASSESSMENTS - Focused Assessment []  - 0 Circumferential Edema Measurements - multi extremities []  - 0 Nutritional Assessment / Counseling / Intervention X- 1 5 Lower Extremity Assessment (monofilament, tuning fork, pulses) []  - 0 Peripheral Arterial Disease Assessment (using hand held doppler) ASSESSMENTS - Ostomy and/or Continence Assessment and Care []  - 0 Incontinence Assessment and Management []  - 0 Ostomy Care Assessment and Management (repouching, etc.) PROCESS - Coordination of Care X - Simple Patient / Family Education for ongoing care 1 15 []  - 0 Complex (extensive) Patient / Family Education for ongoing care X- 1 10 Staff obtains Programmer, systems, Records, T Results / Process Orders est []  - 0 Staff telephones HHA, Nursing Homes / Clarify orders / etc []  - 0 Routine Transfer to another Facility (non-emergent condition) []  - 0 Routine Hospital Admission (non-emergent condition) []  - 0 New Admissions / Biomedical engineer / Ordering NPWT Apligraf, etc. , []  - 0 Emergency Hospital Admission (emergent condition) X- 1 10 Simple Discharge Coordination []  - 0 Complex (extensive) Discharge Coordination PROCESS - Special Needs []  - 0 Pediatric / Minor Patient Management []  - 0 Isolation Patient Management []  - 0 Hearing / Language / Visual special needs []  - 0 Assessment of Community  assistance (transportation, D/C planning, etc.) []  - 0 Additional assistance / Altered mentation []  - 0 Support Surface(s) Assessment (bed, cushion, seat, etc.)  INTERVENTIONS - Wound Cleansing / Measurement X - Simple Wound Cleansing - one wound 1 5 []  - 0 Complex Wound Cleansing - multiple wounds X- 1 5 Wound Imaging (photographs - any number of wounds) []  - 0 Wound Tracing (instead of photographs) X- 1 5 Simple Wound Measurement - one wound []  - 0 Complex Wound Measurement - multiple wounds INTERVENTIONS - Wound Dressings []  - 0 Small Wound Dressing one or multiple wounds []  - 0 Medium Wound Dressing one or multiple wounds []  - 0 Large Wound Dressing one or multiple wounds []  - 0 Application of Medications - topical []  - 0 Application of Medications - injection INTERVENTIONS - Miscellaneous []  - 0 External ear exam []  - 0 Specimen Collection (cultures, biopsies, blood, body fluids, etc.) []  - 0 Specimen(s) / Culture(s) sent or taken to Lab for analysis []  - 0 Patient Transfer (multiple staff / Civil Service fast streamer / Similar devices) []  - 0 Simple Staple / Suture removal (25 or less) []  - 0 Complex Staple / Suture removal (26 or more) []  - 0 Hypo / Hyperglycemic Management (close monitor of Blood Glucose) []  - 0 Ankle / Brachial Index (ABI) - do not check if billed separately X- 1 5 Vital Signs Has the patient been seen at the hospital within the last three years: Yes Total Score: 80 Level Of Care: New/Established - Level 3 Electronic Signature(s) Signed: 07/15/2019 5:31:56 PM By: Levan Hurst RN, BSN Entered By: Levan Hurst on 07/15/2019 10:19:55 -------------------------------------------------------------------------------- Encounter Discharge Information Details Patient Name: Date of Service: David David, David David P. 07/14/2019 8:45 A M Medical Record Number: [id number] Patient Account Number: [id number] Date of Birth/Sex: Treating RN: 03-Jun-1950 (69 y.o. David David Primary Care Marlicia Sroka: [name], [name] Other Clinician: Referring Jaxxson Cavanah: Treating Stevin Bielinski/Extender: Stone III, [name], [name] Weeks in Treatment: 5 Encounter Discharge Information Items Discharge Condition: Stable Ambulatory Status: Ambulatory Discharge Destination: Home Transportation: Private Auto Accompanied By: alone Schedule Follow-up Appointment: Yes Clinical Summary of Care: Patient Declined Electronic Signature(s) Signed: 07/14/2019 5:55:37 PM By: Levan Hurst RN, BSN Entered By: Levan Hurst on 07/14/2019 10:11:33 -------------------------------------------------------------------------------- Lower Extremity Assessment Details Patient Name: Date of Service: [facility], David David David P. 07/14/2019 8:45 A M Medical Record Number: [id number] Patient Account Number: [id number] Date of Birth/Sex: Treating RN: 1950-03-31 (69 y.o. David David Primary Care Lewis Keats: [name], [name] Other Clinician: Referring Beacher Every: Treating Arleatha Philipps/Extender: Stone III, [name], [name] Weeks in Treatment: 5 Edema Assessment Assessed: [Left: Yes] [Right: No] Edema: [Left: N] [Right: o] Calf Left: Right: Point of Measurement: 30 cm From Medial Instep 31 cm cm Ankle Left: Right: Point of Measurement: 11 cm From Medial Instep 20 cm cm Vascular Assessment Pulses: Dorsalis Pedis Palpable: [Left:Yes] Electronic Signature(s) Signed: 07/14/2019 5:12:26 PM By: Deon Pilling Signed: 07/14/2019 5:55:37 PM By: Levan Hurst RN, BSN Entered By: Deon Pilling on 07/14/2019 08:45:44 -------------------------------------------------------------------------------- Multi-Disciplinary Care Plan Details Patient Name: Date of Service: David David, David David P. 07/14/2019 8:45 A M Medical Record Number: [id number] Patient Account Number: [id number] Date of Birth/Sex: Treating RN: [birth year] (69 y.o. David David Primary Care Monseratt Ledin: [name], [name] Other Clinician: Referring  Gracilyn Gunia: Treating Tashawn Laswell/Extender: Stone III, [name], [name] Weeks in Treatment: 5 Active Inactive Electronic Signature(s) Signed: 07/14/2019 5:55:37 PM By: Levan Hurst RN, BSN Entered By: Levan Hurst on 07/14/2019 09:24:32 -------------------------------------------------------------------------------- Pain Assessment Details Patient Name: Date of Service: David David, David David P. 07/14/2019 8:45 A M Medical Record Number: [id number] Patient Account Number: [id number] Date of Birth/Sex: Treating RN:  1950/07/13 (69 y.o. David David Primary Care Delray Reza: [name], [name] Other Clinician: Referring Stanislav Gervase: Treating Liela Rylee/Extender: Stone III, [name], [name] Weeks in Treatment: 5 Active Problems Location of Pain Severity and Description of Pain Patient Has Paino No Site Locations Rate the pain. Rate the pain. Current Pain Level: 0 Pain Management and Medication Current Pain Management: Medication: No Cold Application: No Rest: No Massage: No Activity: No T.E.N.S.: No Heat Application: No Leg drop or elevation: No Is the Current Pain Management Adequate: Adequate How does your wound impact your activities of daily livingo Sleep: No Bathing: No Appetite: No Relationship With Others: No Bladder Continence: No Emotions: No Bowel Continence: No Work: No Toileting: No Drive: No Dressing: No Hobbies: No Electronic Signature(s) Signed: 07/14/2019 5:12:26 PM By: Deon Pilling Signed: 07/14/2019 5:55:37 PM By: Levan Hurst RN, BSN Entered By: Deon Pilling on 07/14/2019 08:43:27 -------------------------------------------------------------------------------- Patient/Caregiver Education Details Patient Name: Date of Service: David David David P. 4/28/2021andnbsp8:45 A M Medical Record Number: [id number] Patient Account Number: [id number] Date of Birth/Gender: Treating RN: 22-Aug-1950 (69 y.o. David David Primary Care Physician: [name],  [name] Other Clinician: Referring Physician: Treating Physician/Extender: Stone III, [name], [name] Weeks in Treatment: 5 Education Assessment Education Provided To: Patient Education Topics Provided Offloading: Methods: Explain/Verbal Responses: State content correctly Wound/Skin Impairment: Methods: Explain/Verbal Responses: State content correctly Electronic Signature(s) Signed: 07/14/2019 5:55:37 PM By: Levan Hurst RN, BSN Entered By: Levan Hurst on 07/14/2019 09:24:46 -------------------------------------------------------------------------------- Wound Assessment Details Patient Name: Date of Service: David David, David David David P. 07/14/2019 8:45 A M Medical Record Number: [id number] Patient Account Number: [id number] Date of Birth/Sex: Treating RN: September 09, 1950 (69 y.o. David David Primary Care Wynn Kernes: [name], [name] Other Clinician: Referring Persais Ethridge: Treating Laprecious Austill/Extender: Stone III, [name], [name] Weeks in Treatment: 5 Wound Status Wound Number: 1 Primary Neuropathic Ulcer-Non Diabetic Etiology: Wound Location: Left, Plantar Foot Wound Healed - Epithelialized Wounding Event: Blister Status: Date Acquired: 05/17/2019 Comorbid Chronic Obstructive Pulmonary Disease (COPD), Hypertension, Weeks Of Treatment: 5 History: Peripheral Arterial Disease, Peripheral Venous Disease, Clustered Wound: No Osteoarthritis, Neuropathy, Seizure Disorder Photos Photo Uploaded By: Mikeal Hawthorne on 07/15/2019 14:23:23 Wound Measurements Length: (cm) Width: (cm) Depth: (cm) Area: (cm) Volume: (cm) 0 % Reduction in Area: 100% 0 % Reduction in Volume: 100% 0 Epithelialization: Large (67-100%) 0 Tunneling: No 0 Undermining: No Wound Description Classification: Full Thickness Without Exposed Support Structures Wound Margin: Flat and Intact Exudate Amount: None Present Foul Odor After Cleansing: No Slough/Fibrino No Wound Bed Granulation Amount: None Present  (0%) Exposed Structure Necrotic Amount: None Present (0%) Fascia Exposed: No Fat Layer (Subcutaneous Tissue) Exposed: No Tendon Exposed: No Muscle Exposed: No Joint Exposed: No Bone Exposed: No Assessment Notes callus over wound bed. Electronic Signature(s) Signed: 07/14/2019 5:55:37 PM By: Levan Hurst RN, BSN Entered By: Levan Hurst on 07/14/2019 09:22:55 -------------------------------------------------------------------------------- Vitals Details Patient Name: Date of Service: David David, David David P. 07/14/2019 8:45 A M Medical Record Number: [id number] Patient Account Number: [id number] Date of Birth/Sex: Treating RN: Dec 10, 1950 (69 y.o. David David Primary Care Orey Moure: [name], [name] Other Clinician: Referring Taleeya Blondin: Treating Amiria Orrison/Extender: Stone III, [name], [name] Weeks in Treatment: 5 Vital Signs Time Taken: 08:42 Temperature (F): 98.2 Weight (lbs): 185 Pulse (bpm): 84 Respiratory Rate (breaths/min): 18 Blood Pressure (mmHg): 149/79 Reference Range: 80 - 120 mg / dl Electronic Signature(s) Signed: 07/14/2019 5:12:26 PM By: Deon Pilling Entered By: Deon Pilling on 07/14/2019 08:44:59

## 2019-08-09 NOTE — Progress Notes (Signed)
KHAMBREL, AMSDEN ([id number]) Visit Report for 06/30/2019 Arrival Information Details Patient Name: Date of Service: David David MES P. 06/30/2019 8:45 A M Medical Record Number: [id number] Patient Account Number: [id number] Date of Birth/Sex: Treating RN: November 15, 1950 (69 y.o. David David Primary Care David David: [name], [name] Other Clinician: Referring Jama Mcmiller: Treating Kedron Uno/Extender: Stone III, [name], [name] Weeks in Treatment: 3 Visit Information History Since Last Visit Added or deleted any medications: No Patient Arrived: Ambulatory Any new allergies or adverse reactions: No Arrival Time: 08:53 Had a fall or experienced change in No Accompanied By: alone activities of daily living that may affect Transfer Assistance: None risk of falls: Patient Identification Verified: Yes Signs or symptoms of abuse/neglect since last visito No Secondary Verification Process Completed: Yes Hospitalized since last visit: No Patient Requires Transmission-Based Precautions: No Implantable device outside of the clinic excluding No Patient Has Alerts: Yes cellular tissue based products placed in the center Patient Alerts: L ABI = 1.23 (03/2019) since last visit: Has Dressing in Place as Prescribed: Yes Pain Present Now: No Electronic Signature(s) Signed: 06/30/2019 5:43:43 PM By: Levan Hurst RN, BSN Entered By: Levan Hurst on 06/30/2019 08:53:19 -------------------------------------------------------------------------------- Clinic Level of Care Assessment Details Patient Name: Date of Service: [name] [name] 06/30/2019 8:45 A M Medical Record Number: [id number] Patient Account Number: [id number] Date of Birth/Sex: Treating RN: 12-20-1950 (69 y.o. David David Primary Care David David: [name], [name] Other Clinician: Referring David David: Treating David David/Extender: Stone III, [name], [name] Weeks in Treatment: 3 Clinic Level of Care Assessment Items TOOL 4  Quantity Score []  - 0 Use when only an EandM is performed on FOLLOW-UP visit ASSESSMENTS - Nursing Assessment / Reassessment X- 1 10 Reassessment of Co-morbidities (includes updates in patient status) X- 1 5 Reassessment of Adherence to Treatment Plan ASSESSMENTS - Wound and Skin A ssessment / Reassessment X - Simple Wound Assessment / Reassessment - one wound 1 5 []  - 0 Complex Wound Assessment / Reassessment - multiple wounds []  - 0 Dermatologic / Skin Assessment (not related to wound area) ASSESSMENTS - Focused Assessment []  - 0 Circumferential Edema Measurements - multi extremities []  - 0 Nutritional Assessment / Counseling / Intervention X- 1 5 Lower Extremity Assessment (monofilament, tuning fork, pulses) []  - 0 Peripheral Arterial Disease Assessment (using hand held doppler) ASSESSMENTS - Ostomy and/or Continence Assessment and Care []  - 0 Incontinence Assessment and Management []  - 0 Ostomy Care Assessment and Management (repouching, etc.) PROCESS - Coordination of Care X - Simple Patient / Family Education for ongoing care 1 15 []  - 0 Complex (extensive) Patient / Family Education for ongoing care X- 1 10 Staff obtains Programmer, systems, Records, T Results / Process Orders est []  - 0 Staff telephones HHA, Nursing Homes / Clarify orders / etc []  - 0 Routine Transfer to another Facility (non-emergent condition) []  - 0 Routine Hospital Admission (non-emergent condition) []  - 0 New Admissions / Biomedical engineer / Ordering NPWT Apligraf, etc. , []  - 0 Emergency Hospital Admission (emergent condition) X- 1 10 Simple Discharge Coordination []  - 0 Complex (extensive) Discharge Coordination PROCESS - Special Needs []  - 0 Pediatric / Minor Patient Management []  - 0 Isolation Patient Management []  - 0 Hearing / Language / Visual special needs []  - 0 Assessment of Community assistance (transportation, D/C planning, etc.) []  - 0 Additional assistance / Altered  mentation []  - 0 Support Surface(s) Assessment (bed, cushion, seat, etc.) INTERVENTIONS - Wound Cleansing / Measurement X - Simple Wound Cleansing -  one wound 1 5 []  - 0 Complex Wound Cleansing - multiple wounds X- 1 5 Wound Imaging (photographs - any number of wounds) []  - 0 Wound Tracing (instead of photographs) X- 1 5 Simple Wound Measurement - one wound []  - 0 Complex Wound Measurement - multiple wounds INTERVENTIONS - Wound Dressings X - Small Wound Dressing one or multiple wounds 1 10 []  - 0 Medium Wound Dressing one or multiple wounds []  - 0 Large Wound Dressing one or multiple wounds X- 1 5 Application of Medications - topical []  - 0 Application of Medications - injection INTERVENTIONS - Miscellaneous []  - 0 External ear exam []  - 0 Specimen Collection (cultures, biopsies, blood, body fluids, etc.) []  - 0 Specimen(s) / Culture(s) sent or taken to Lab for analysis []  - 0 Patient Transfer (multiple staff / Civil Service fast streamer / Similar devices) []  - 0 Simple Staple / Suture removal (25 or less) []  - 0 Complex Staple / Suture removal (26 or more) []  - 0 Hypo / Hyperglycemic Management (close monitor of Blood Glucose) []  - 0 Ankle / Brachial Index (ABI) - do not check if billed separately X- 1 5 Vital Signs Has the patient been seen at the hospital within the last three years: Yes Total Score: 95 Level Of Care: New/Established - Level 3 Electronic Signature(s) Signed: 06/30/2019 5:43:24 PM By: Baruch Gouty RN, BSN Entered By: Baruch Gouty on 06/30/2019 09:47:15 -------------------------------------------------------------------------------- Encounter Discharge Information Details Patient Name: Date of Service: David David, David David MES P. 06/30/2019 8:45 A M Medical Record Number: [id number] Patient Account Number: [id number] Date of Birth/Sex: Treating RN: Mar 16, 1951 (69 y.o. David David Primary Care David David: [name], [name] Other Clinician: Referring  David David: Treating David David/Extender: Stone III, [name], [name] Weeks in Treatment: 3 Encounter Discharge Information Items Discharge Condition: Stable Ambulatory Status: Ambulatory Discharge Destination: Home Transportation: Private Auto Accompanied By: self Schedule Follow-up Appointment: Yes Clinical Summary of Care: Electronic Signature(s) Signed: 08/09/2019 1:31:05 PM By: Deon Pilling Entered By: Deon Pilling on 06/30/2019 10:07:32 -------------------------------------------------------------------------------- Lower Extremity Assessment Details Patient Name: Date of Service: [facility] MES P. 06/30/2019 8:45 A M Medical Record Number: [id number] Patient Account Number: [id number] Date of Birth/Sex: Treating RN: [birth year] (69 y.o. David David Primary Care Margret Moat: [name], [name] Other Clinician: Referring Amariyana Heacox: Treating Zaelynn Fuchs/Extender: Stone III, [name], [name] Weeks in Treatment: 3 Edema Assessment Assessed: [Left: No] [Right: No] Edema: [Left: N] [Right: o] Calf Left: Right: Point of Measurement: 30 cm From Medial Instep 31.5 cm cm Ankle Left: Right: Point of Measurement: 11 cm From Medial Instep 20.3 cm cm Vascular Assessment Pulses: Dorsalis Pedis Palpable: [Left:Yes] Electronic Signature(s) Signed: 06/30/2019 5:43:43 PM By: Levan Hurst RN, BSN Entered By: Levan Hurst on 06/30/2019 08:55:28 -------------------------------------------------------------------------------- Multi-Disciplinary Care Plan Details Patient Name: Date of Service: David David, David David MES P. 06/30/2019 8:45 A M Medical Record Number: [id number] Patient Account Number: [id number] Date of Birth/Sex: Treating RN: 11/14/1950 (69 y.o. David David Primary Care Ramsey Midgett: [name], [name] Other Clinician: Referring Lamira Borin: Treating Gilmer Kaminsky/Extender: Stone III, [name], [name] Weeks in Treatment: 3 Active Inactive Peripheral Neuropathy Nursing  Diagnoses: Knowledge deficit related to disease process and management of peripheral neurovascular dysfunction Potential alteration in peripheral tissue perfusion (select prior to confirmation of diagnosis) Goals: Patient/caregiver will verbalize understanding of disease process and disease management Date Initiated: 06/09/2019 Target Resolution Date: 07/07/2019 Goal Status: Active Interventions: Assess signs and symptoms of neuropathy upon admission and as needed Provide education on Management of Neuropathy and Related Ulcers Notes:  Wound/Skin Impairment Nursing Diagnoses: Impaired tissue integrity Knowledge deficit related to smoking impact on wound healing Knowledge deficit related to ulceration/compromised skin integrity Goals: Patient will demonstrate a reduced rate of smoking or cessation of smoking Date Initiated: 06/09/2019 Target Resolution Date: 07/07/2019 Goal Status: Active Patient/caregiver will verbalize understanding of skin care regimen Date Initiated: 06/09/2019 Target Resolution Date: 07/07/2019 Goal Status: Active Ulcer/skin breakdown will have a volume reduction of 30% by week 4 Date Initiated: 06/09/2019 Target Resolution Date: 07/07/2019 Goal Status: Active Interventions: Assess patient/caregiver ability to obtain necessary supplies Assess patient/caregiver ability to perform ulcer/skin care regimen upon admission and as needed Assess ulceration(s) every visit Provide education on smoking Provide education on ulcer and skin care Treatment Activities: Skin care regimen initiated : 06/09/2019 Topical wound management initiated : 06/09/2019 Notes: Electronic Signature(s) Signed: 06/30/2019 5:43:24 PM By: Baruch Gouty RN, BSN Entered By: Baruch Gouty on 06/30/2019 09:42:12 -------------------------------------------------------------------------------- Pain Assessment Details Patient Name: Date of Service: David David, David David MES P. 06/30/2019 8:45 A M Medical  Record Number: [id number] Patient Account Number: [id number] Date of Birth/Sex: Treating RN: [birth year] (69 y.o. David David Primary Care Corinne Goucher: [name], [name] Other Clinician: Referring Tsugio Elison: Treating Marien Manship/Extender: Stone III, [name], [name] Weeks in Treatment: 3 Active Problems Location of Pain Severity and Description of Pain Patient Has Paino No Site Locations Pain Management and Medication Current Pain Management: Electronic Signature(s) Signed: 06/30/2019 5:43:43 PM By: Levan Hurst RN, BSN Entered By: Levan Hurst on 06/30/2019 08:54:42 -------------------------------------------------------------------------------- Patient/Caregiver Education Details Patient Name: Date of Service: David David MES P. 4/14/2021andnbsp8:45 A M Medical Record Number: [id number] Patient Account Number: [id number] Date of Birth/Gender: Treating RN: 26-Jun-1950 (69 y.o. David David Primary Care Physician: [name], [name] Other Clinician: Referring Physician: Treating Physician/Extender: Stone III, [name], [name] Weeks in Treatment: 3 Education Assessment Education Provided To: Patient Education Topics Provided Offloading: Methods: Explain/Verbal Responses: Reinforcements needed, State content correctly Wound/Skin Impairment: Methods: Explain/Verbal Responses: Reinforcements needed, State content correctly Electronic Signature(s) Signed: 06/30/2019 5:43:24 PM By: Baruch Gouty RN, BSN Entered By: Baruch Gouty on 06/30/2019 09:42:38 -------------------------------------------------------------------------------- Wound Assessment Details Patient Name: Date of Service: David David, David David MES P. 06/30/2019 8:45 A M Medical Record Number: [id number] Patient Account Number: [id number] Date of Birth/Sex: Treating RN: 1951-01-15 (69 y.o. David David Primary Care Talaysia Pinheiro: [name], [name] Other Clinician: Referring Sarahann Horrell: Treating Johncharles Fusselman/Extender:  Stone III, [name], [name] Weeks in Treatment: 3 Wound Status Wound Number: 1 Primary Neuropathic Ulcer-Non Diabetic Etiology: Wound Location: Left, Plantar Foot Wound Open Wounding Event: Blister Status: Date Acquired: 05/17/2019 Comorbid Chronic Obstructive Pulmonary Disease (COPD), Hypertension, Weeks Of Treatment: 3 History: Peripheral Arterial Disease, Peripheral Venous Disease, Clustered Wound: No Osteoarthritis, Neuropathy, Seizure Disorder Photos Photo Uploaded By: Mikeal Hawthorne on 07/05/2019 14:22:13 Wound Measurements Length: (cm) 0.5 Width: (cm) 1.3 Depth: (cm) 0.2 Area: (cm) 0.511 Volume: (cm) 0.102 % Reduction in Area: 82.6% % Reduction in Volume: 88.5% Epithelialization: Small (1-33%) Tunneling: No Undermining: Yes Starting Position (o'clock): 12 Ending Position (o'clock): 3 Maximum Distance: (cm) 0.3 Wound Description Classification: Full Thickness Without Exposed Support Structures Wound Margin: Flat and Intact Exudate Amount: Medium Exudate Type: Serosanguineous Exudate Color: red, brown Foul Odor After Cleansing: No Slough/Fibrino Yes Wound Bed Granulation Amount: Large (67-100%) Exposed Structure Granulation Quality: Pink Fascia Exposed: No Necrotic Amount: Small (1-33%) Fat Layer (Subcutaneous Tissue) Exposed: Yes Necrotic Quality: Adherent Slough Tendon Exposed: No Muscle Exposed: No Joint Exposed: No Bone Exposed: No Electronic Signature(s) Signed: 06/30/2019 5:43:43 PM By: Levan Hurst RN, BSN  Entered By: Levan Hurst on 06/30/2019 08:59:07 -------------------------------------------------------------------------------- Vitals Details Patient Name: Date of Service: [facility] MES P. 06/30/2019 8:45 A M Medical Record Number: [id number] Patient Account Number: [id number] Date of Birth/Sex: Treating RN: 21-Oct-1950 (68 y.o. David David Primary Care Onisha Cedeno: [name], [name] Other Clinician: Referring Tahjay Binion: Treating  Arias Weinert/Extender: Stone III, [name], [name] Weeks in Treatment: 3 Vital Signs Time Taken: 08:54 Temperature (F): 97.9 Weight (lbs): 185 Pulse (bpm): 75 Respiratory Rate (breaths/min): 18 Blood Pressure (mmHg): 153/69 Reference Range: 80 - 120 mg / dl Electronic Signature(s) Signed: 06/30/2019 5:43:43 PM By: Levan Hurst RN, BSN Entered By: Levan Hurst on 06/30/2019 08:54:37

## 2019-08-20 ENCOUNTER — Telehealth: Payer: Self-pay | Admitting: Family Medicine

## 2019-08-20 NOTE — Progress Notes (Signed)
  Chronic Care Management   Note  08/20/2019 Name: KOUPER SPINELLA MRN: [id number] DOB: 06-Aug-1950  Laurice Record Cousineau is a 69 y.o. year old male who is a primary care patient of [name], Malka So, MD. I reached out to [name] by phone today in response to a referral sent by Mr. Caleb Decock Petrosky's PCP, [name], [name], MD.   Mr. Kelly was given information about Chronic Care Management services today including:  1. CCM service includes personalized support from designated clinical staff supervised by his physician, including individualized plan of care and coordination with other care providers 2. 24/7 contact phone numbers for assistance for urgent and routine care needs. 3. Service will only be billed when office clinical staff spend 20 minutes or more in a month to coordinate care. 4. Only one practitioner may furnish and bill the service in a calendar month. 5. The patient may stop CCM services at any time (effective at the end of the month) by phone call to the office staff.   Patient wishes to consider information provided and/or speak with a member of the care team before deciding about enrollment in care management services.   Follow up plan:   Rodriguez Camp

## 2020-02-18 DIAGNOSIS — H33321 Round hole, right eye: Secondary | ICD-10-CM | POA: Diagnosis not present

## 2020-03-30 ENCOUNTER — Other Ambulatory Visit: Payer: Self-pay

## 2020-03-30 ENCOUNTER — Ambulatory Visit (HOSPITAL_COMMUNITY)
Admission: RE | Admit: 2020-03-30 | Discharge: 2020-03-30 | Disposition: A | Discharge status: Home / Self Care | Payer: Medicare Other | Source: Ambulatory Visit | Attending: Cardiology | Admitting: Cardiology

## 2020-03-30 ENCOUNTER — Other Ambulatory Visit (HOSPITAL_COMMUNITY): Payer: Self-pay | Admitting: Cardiovascular Disease

## 2020-03-30 DIAGNOSIS — Z95820 Peripheral vascular angioplasty status with implants and grafts: Secondary | ICD-10-CM | POA: Diagnosis not present

## 2020-03-30 DIAGNOSIS — I739 Peripheral vascular disease, unspecified: Secondary | ICD-10-CM | POA: Diagnosis not present

## 2020-04-18 ENCOUNTER — Ambulatory Visit: Payer: Medicare Other | Admitting: Cardiovascular Disease

## 2020-04-18 ENCOUNTER — Other Ambulatory Visit: Payer: Self-pay

## 2020-04-18 ENCOUNTER — Encounter: Payer: Self-pay | Admitting: Cardiovascular Disease

## 2020-04-18 VITALS — BP 142/74 | HR 54 | Ht 75.0 in | Wt 178.4 lb

## 2020-04-18 DIAGNOSIS — E785 Hyperlipidemia, unspecified: Secondary | ICD-10-CM

## 2020-04-18 DIAGNOSIS — I739 Peripheral vascular disease, unspecified: Secondary | ICD-10-CM | POA: Diagnosis not present

## 2020-04-18 DIAGNOSIS — Z720 Tobacco use: Secondary | ICD-10-CM | POA: Diagnosis not present

## 2020-04-18 DIAGNOSIS — I10 Essential (primary) hypertension: Secondary | ICD-10-CM | POA: Diagnosis not present

## 2020-04-18 NOTE — Patient Instructions (Signed)
Medication Instructions:  No changes *If you need a refill on your cardiac medications before your next appointment, please call your pharmacy*   Lab Work: None ordered If you have labs (blood work) drawn today and your tests are completely normal, you will receive your results only by: Marland Kitchen MyChart Message (if you have MyChart) OR . A paper copy in the mail If you have any lab test that is abnormal or we need to change your treatment, we will call you to review the results.   Testing/Procedures: None ordered   Follow-Up: At [facility], you and your health needs are our priority.  As part of our continuing mission to provide you with exceptional heart care, we have created designated Provider Care Teams.  These Care Teams include your primary Cardiologist (physician) and Advanced Practice Providers (APPs -  Physician Assistants and Nurse Practitioners) who all work together to provide you with the care you need, when you need it.  We recommend signing up for the patient portal called "MyChart".  Sign up information is provided on this After Visit Summary.  MyChart is used to connect with patients for Virtual Visits (Telemedicine).  Patients are able to view lab/test results, encounter notes, upcoming appointments, etc.  Non-urgent messages can be sent to your provider as well.   To learn more about what you can do with MyChart, go to [email or web address].    Your next appointment:   12 month(s)  The format for your next appointment:   In Person  Provider:   Kathlyn Sacramento, MD

## 2020-04-18 NOTE — Progress Notes (Signed)
Cardiology Office Note   Date:  04/18/2020   ID:  Carron, Jaggi [birth year], MRN [id number]  PCP:  [name], [name], MD  Cardiologist:   Kathlyn Sacramento, MD   No chief complaint on file.     History of Present Illness: [place] C[place]ANI is a 70 y.o. male who presents for a follow-up visit regarding peripheral arterial disease. The patient has lower extremity peripheral arterial disease with recurrent atheroembolic events to the left foot due to distal left popliteal artery stenosis with previous Cypher drug-eluting stent placement which was used in order to treat an ulcerated plaque with distal embolization.  He was seen in 11/2012  for a painless rash involving the left great toe and medial foot, with typical appearance of an athero embolic event. Echocardiogram was unremarkable.  CTA of the chest abdomen and pelvis with runoff to the feet demonstrated scattered atheromatous the aorta but no significant findings.  The left tibioperoneal stent had greater than 70% stenosis. ABI was normal but there was significant dampening in pressure noted in the big toe. I proceeded with angiography which confirmed this and also showed an occluded dorsalis pedis likely from embolization. I performed balloon angioplasty with an Angioscore balloon to both ostial anterior tibial and ostial tibioperoneal trunk.  He had cardiac catheterization in May of 2016 which showed moderate left circumflex stenosis and no evidence of obstructive disease.  He had dizziness and presyncope in the past and thought to have a TIA but neurologic evaluation was overall unremarkable.  He had recent vascular studies which showed normal ABI bilaterally.  Duplex showed patent stent in the distal left popliteal artery with significant stenosis distal to the stent with peak velocity of 293.  This is slightly worse than last year.  Currently, he denies claudication or lower extremity ulceration.  Unfortunately, he resumed smoking  and currently smokes half a pack per day.   Past Medical History:  Diagnosis Date  . Alcoholism in family   . Anxiety   . Arthritis   . Depressive disorder, not elsewhere classified   . Diverticulosis of colon (without mention of hemorrhage)   . Dysfunction of eustachian tube   . Exposure to STD   . Family history of diabetes mellitus   . Family history of psychiatric condition    family Hx of suicide   . GERD (gastroesophageal reflux disease)   . Impotence of organic origin    erectile dysfunction  . Lipoprotein deficiencies   . Other diseases of lung, not elsewhere classified    pulmonary nodule  . Peripheral vascular disease, unspecified ([place])    lower extremity peripheral artery disease  . Personal history of colonic polyps   . Routine general medical examination at a health care facility   . Seizures ([place])   . Sprain of neck    Hx  . Thyroid disease   . Tobacco use disorder   . Unspecified hypertensive kidney disease with chronic kidney disease stage I through stage IV, or unspecified(403.90)    cholesterol embolization syndrome    Past Surgical History:  Procedure Laterality Date  . angiography     bilateral lower extremity angiography, stenting of the left.   Marland Kitchen CARDIAC CATHETERIZATION N/A 07/19/2014   Procedure: Left Heart Cath and Coronary Angiography;  Surgeon: Leonie Man, MD;  Location: [facility] INVASIVE CV LAB CUPID;  Service: Cardiovascular;  Laterality: N/A;  . CATARACT EXTRACTION Bilateral   . EYE SURGERY  2016   blown macular  .  KNEE ARTHROSCOPY Left    x 2  . LOWER EXTREMITY ANGIOGRAM N/A 11/04/2012   Procedure: LOWER EXTREMITY ANGIOGRAM;  Surgeon: Wellington Hampshire, MD;  Location: [facility] CATH LAB;  Service: Cardiovascular;  Laterality: N/A;  . POPLITEAL ARTERY ANGIOPLASTY     didnt specify angioplasty or stent  . SPERMATOCELECTOMY Left    testicular cyst  . VASECTOMY       Current Outpatient Medications  Medication Sig Dispense Refill  . albuterol  (PROVENTIL HFA;VENTOLIN HFA) 108 (90 Base) MCG/ACT inhaler Inhale 2 puffs into the lungs as needed for wheezing or shortness of breath.    . alfuzosin (UROXATRAL) 10 MG 24 hr tablet Take 10 mg by mouth at bedtime.  11  . amLODipine (NORVASC) 10 MG tablet Take 5 mg by mouth daily.    Marland Kitchen ammonium lactate (LAC-HYDRIN) 12 % lotion Apply 1 application topically 2 (two) times daily as needed for dry skin.    Marland Kitchen aspirin EC 81 MG tablet Take 1 tablet (81 mg total) by mouth daily. 90 tablet 3  . cephALEXin (KEFLEX) 500 MG capsule Take 1 capsule (500 mg total) by mouth 4 (four) times daily. 40 capsule 0  . cetirizine (ZYRTEC) 10 MG tablet Take 1 tablet (10 mg total) by mouth daily. 30 tablet 0  . clopidogrel (PLAVIX) 75 MG tablet Take 1 tablet (75 mg total) by mouth daily. 30 tablet 3  . escitalopram (LEXAPRO) 10 MG tablet Take 30 mg by mouth every morning.     . gabapentin (NEURONTIN) 300 MG capsule Take 600 mg by mouth 2 (two) times daily.    . Gauze Pads & Dressings (GAUZE DRESSING) 4"X4" PADS Use as instructed for wound care. 30 each 1  . hydrOXYzine (ATARAX) 50 MG tablet Take 50 mg by mouth 2 (two) times daily.    . mirtazapine (REMERON) 15 MG tablet Take 1 tablet (15 mg total) by mouth at bedtime. 30 tablet 2  . Multiple Vitamin (MULTI-VITAMIN) tablet Take by mouth. theragran    . Omega-3 Fatty Acids (FISH OIL) 1000 MG CAPS Take 1,000 mg by mouth 2 (two) times daily.     Marland Kitchen omeprazole (PRILOSEC) 40 MG capsule Take 40 mg by mouth daily.    . pravastatin (PRAVACHOL) 40 MG tablet Take 40 mg by mouth at bedtime.    . sodium chloride 0.9 % nebulizer solution Take 3 mLs by nebulization as needed for wheezing. 90 mL 12   No current facility-administered medications for this visit.    Allergies:   Bupropion, Methylphenidate, Tamsulosin, Tape, Trazodone, Neomycin-bacitracin zn-polymyx, Niacin, and Paroxetine    Social History:  The patient  reports that he has quit smoking. His smoking use included  cigarettes. He started smoking about 3 years ago. He has a 50.00 pack-year smoking history. He has never used smokeless tobacco. He reports current alcohol use. He reports current drug use. Drug: Marijuana.   Family History:  The patient's family history includes Alzheimer's disease in his brother and sister; CAD in his father; Congestive Heart Failure in his mother; Diabetes in his brother; Lung cancer in his brother.    ROS:  Please see the history of present illness.   Otherwise, review of systems are positive for none.   All other systems are reviewed and negative.    PHYSICAL EXAM: VS:  BP (!) 142/74   Pulse (!) 54   Ht 6\' 3"  (1.905 m)   Wt 178 lb 6.4 oz (80.9 kg)   BMI 22.30 kg/m  ,  BMI Body mass index is 22.3 kg/m. GEN: Well nourished, well developed, in no acute distress  HEENT: normal  Neck: no JVD, carotid bruits, or masses Cardiac: RRR; no murmurs, rubs, or gallops,no edema  Respiratory:  clear to auscultation bilaterally, normal work of breathing GI: soft, nontender, nondistended, + BS MS: no deformity or atrophy  Skin: warm and dry, no rash Neuro:  Strength and sensation are intact Psych: euthymic mood, full affect Vascular: Dorsalis pedis: Palpable on the right side but not the left.  Posterior tibialis palpable bilaterally.  There is mild bluish discoloration of both feet slightly worse on the left side.  EKG:  EKG is ordered today. EKG showed sinus bradycardia with nonspecific T wave changes.   Recent Labs: 06/03/2019: BUN 13; Creatinine, Ser 1.18; Hemoglobin 13.3; Platelets 110; Potassium 3.2; Sodium 134    Lipid Panel    Component Value Date/Time   CHOL 126 04/17/2017 0344   TRIG 150 (H) 04/17/2017 0344   HDL 36 (L) 04/17/2017 0344   CHOLHDL 3.5 04/17/2017 0344   VLDL 30 04/17/2017 0344   LDLCALC 60 04/17/2017 0344      Wt Readings from Last 3 Encounters:  04/18/20 178 lb 6.4 oz (80.9 kg)  06/03/19 190 lb (86.2 kg)  04/06/19 189 lb (85.7 kg)       No flowsheet data found.    ASSESSMENT AND PLAN:  1.  Peripheral arterial disease: Currently with no claudication or ulceration.  Vascular studies did show some restenosis distal to the left popliteal artery stent.  Continue close observation for now.  Continue dual antiplatelet therapy.  I asked him to notify me if he develops symptoms.  Repeat Doppler studies in a year.  2. Tobacco use:  Unfortunately, he is smoking again.  I had a prolonged discussion with him about the importance of smoking cessation.  3. Essential hypertension: Blood pressure is reasonably controlled.  4. Hyperlipidemia: Continue treatment with pravastatin with a target LDL of less than 70.    Most recent LDL was 60.    Disposition:   FU with me in 1 year  Signed,  Kathlyn Sacramento, MD  04/18/2020 9:53 AM    [place]

## 2020-04-20 ENCOUNTER — Telehealth: Payer: Self-pay | Admitting: Family Medicine

## 2020-04-20 NOTE — Telephone Encounter (Signed)
Left message for patient to call back and schedule Medicare Annual Wellness Visit (AWV) either virtually or in office.   Last AWV 07/08/17  please schedule at anytime with [facility] Nurse Health Advisor 1 or 2   This should be a 45 minute visit. Patient needs appointment with pcp last appointment 01/11/2019

## 2020-06-12 ENCOUNTER — Telehealth: Payer: Self-pay | Admitting: Family Medicine

## 2020-06-12 NOTE — Telephone Encounter (Signed)
Left message for patient to call back and schedule Medicare Annual Wellness Visit (AWV) either virtually or in office. No detailed message left   Last AWV 07/08/17  please schedule at anytime with [facility] Nurse Health Advisor 1 or 2   This should be a 45 minute visit. Patient also needs appointment with pcp  Last appointment 01/11/2019

## 2020-11-23 ENCOUNTER — Ambulatory Visit (INDEPENDENT_AMBULATORY_CARE_PROVIDER_SITE_OTHER): Payer: Medicare Other | Admitting: Family Medicine

## 2020-11-23 ENCOUNTER — Encounter: Payer: Self-pay | Admitting: Family Medicine

## 2020-11-23 ENCOUNTER — Other Ambulatory Visit: Payer: Self-pay

## 2020-11-23 VITALS — BP 138/76 | HR 67 | Temp 98.3°F | Wt 166.4 lb

## 2020-11-23 DIAGNOSIS — M546 Pain in thoracic spine: Secondary | ICD-10-CM | POA: Diagnosis not present

## 2020-11-23 DIAGNOSIS — S46811A Strain of other muscles, fascia and tendons at shoulder and upper arm level, right arm, initial encounter: Secondary | ICD-10-CM | POA: Diagnosis not present

## 2020-11-23 MED ORDER — CYCLOBENZAPRINE HCL 10 MG PO TABS: 10.0000 mg | ORAL_TABLET | Freq: Every evening | ORAL | 0 refills | Status: DC | PRN

## 2020-11-23 NOTE — Progress Notes (Signed)
Subjective:    Patient ID: David David, male    DOB: 1950/11/02, 70 y.o.   MRN: [id number]  Chief Complaint  Patient presents with   Chest Pain    Sharp sometimes shooting pain on right side of ribs, on back side. Started about 2 weeks ago off and on but is aggraviating so wants to se about it     HPI Patient was seen today for ongoing concern.  Pt with ongoing back pain x 1.5 x 2 wks.  States pain started after getting out of bed.  Pain noted as a sharp, pulling sensation in R mid back with certain movements and to palpation.  Pt endorses doing yard work.  Pt has not tried anything for his symptoms.  States "I take enough medicines as it is", "did you see the meds I'm on, do you think tylenol works for me?"  "I don't just come to the doctor for social visits".  Pt having a difficult time answering straight forward questions.  Past Medical History:  Diagnosis Date   Alcoholism in family    Anxiety    Arthritis    Depressive disorder, not elsewhere classified    Diverticulosis of colon (without mention of hemorrhage)    Dysfunction of eustachian tube    Exposure to STD    Family history of diabetes mellitus    Family history of psychiatric condition    family Hx of suicide    GERD (gastroesophageal reflux disease)    Impotence of organic origin    erectile dysfunction   Lipoprotein deficiencies    Other diseases of lung, not elsewhere classified    pulmonary nodule   Peripheral vascular disease, unspecified ([place])    lower extremity peripheral artery disease   Personal history of colonic polyps    Routine general medical examination at a health care facility    Seizures ([place])    Sprain of neck    Hx   Thyroid disease    Tobacco use disorder    Unspecified hypertensive kidney disease with chronic kidney disease stage I through stage IV, or unspecified(403.90)    cholesterol embolization syndrome    Allergies  Allergen Reactions   Bupropion Other (See Comments)    Methylphenidate Other (See Comments)   Tamsulosin Swelling   Tape Other (See Comments)    "Plastic" tape TEARS THE SKIN; please use paper tape or Coban wrap!!   Trazodone Other (See Comments)   Neomycin-Bacitracin Zn-Polymyx Rash   Niacin Rash   Paroxetine Other (See Comments)    Makes the patient feel SEVERELY SEDATED/LETHARGIC/CANNOT SPEAK WORDS Other reaction(s): Lethargy (intolerance) REACTION: goofy    ROS General: Denies fever, chills, night sweats, changes in weight, changes in appetite HEENT: Denies headaches, ear pain, changes in vision, rhinorrhea, sore throat CV: Denies CP, palpitations, SOB, orthopnea Pulm: Denies SOB, cough, wheezing GI: Denies abdominal pain, nausea, vomiting, diarrhea, constipation GU: Denies dysuria, hematuria, frequency Msk: Denies muscle cramps, joint pains  +R sided lateral mid back pain Neuro: Denies weakness, numbness, tingling Skin: Denies rashes, bruising Psych: Denies depression, anxiety, hallucinations     Objective:    Blood pressure 138/76, pulse 67, temperature 98.3 F (36.8 C), temperature source Oral, weight 166 lb 6.4 oz (75.5 kg), SpO2 96 %.  Gen. Pleasant, well-nourished, in no distress, normal affect   HEENT: [place]/AT, face symmetric, conjunctiva clear, no scleral icterus, PERRLA, EOMI, nares patent without drainage Lungs: no accessory muscle use, CTAB, no wheezes or rales Cardiovascular: RRR, no m/r/g,  no peripheral edema Musculoskeletal: Np TTP of cervical, thoracic, lumbar spine or paraspinal muscles.  Mild TTP of mid R back inferior to right scapular edge. No deformities, no cyanosis or clubbing, normal tone Neuro:  A&Ox3, CN II-XII intact, normal gait Skin:  Warm, no lesions/ rash   Wt Readings from Last 3 Encounters:  04/18/20 178 lb 6.4 oz (80.9 kg)  06/03/19 190 lb (86.2 kg)  04/06/19 189 lb (85.7 kg)    Lab Results  Component Value Date   WBC 10.0 06/03/2019   HGB 13.3 06/03/2019   HCT 40.6 06/03/2019   PLT  110 (L) 06/03/2019   GLUCOSE 181 (H) 06/03/2019   CHOL 126 04/17/2017   TRIG 150 (H) 04/17/2017   HDL 36 (L) 04/17/2017   LDLCALC 60 04/17/2017   ALT 26 04/16/2017   AST 31 04/16/2017   NA 134 (L) 06/03/2019   K 3.2 (L) 06/03/2019   CL 103 06/03/2019   CREATININE 1.18 06/03/2019   BUN 13 06/03/2019   CO2 21 (L) 06/03/2019   TSH 2.95 09/16/2017   PSA 1.66 07/29/2016   INR 0.97 04/16/2017   HGBA1C 6.1 (H) 04/17/2017    Assessment/Plan:  Acute right-sided thoracic back pain  -Supportive care including heat, massage, topical analgesics, NSAIDs or Tylenol as needed -Given handouts - Plan: cyclobenzaprine (FLEXERIL) 10 MG tablet  Strain of right trapezius muscle, initial encounter  - Plan: cyclobenzaprine (FLEXERIL) 10 MG tablet  Be advised this provider will not see this pt in the future.  F/u with PCP.    Grier Mitts, MD

## 2021-03-30 ENCOUNTER — Ambulatory Visit (HOSPITAL_COMMUNITY)
Admission: RE | Admit: 2021-03-30 | Discharge: 2021-03-30 | Disposition: A | Discharge status: Home / Self Care | Payer: Medicare Other | Source: Ambulatory Visit | Attending: Cardiology | Admitting: Cardiology

## 2021-03-30 ENCOUNTER — Other Ambulatory Visit: Payer: Self-pay

## 2021-03-30 DIAGNOSIS — Z95820 Peripheral vascular angioplasty status with implants and grafts: Secondary | ICD-10-CM

## 2021-04-06 DIAGNOSIS — H33321 Round hole, right eye: Secondary | ICD-10-CM | POA: Diagnosis not present

## 2021-04-24 ENCOUNTER — Ambulatory Visit: Payer: Medicare Other | Admitting: Cardiovascular Disease

## 2021-04-24 ENCOUNTER — Encounter: Payer: Self-pay | Admitting: Cardiovascular Disease

## 2021-04-24 ENCOUNTER — Other Ambulatory Visit: Payer: Self-pay

## 2021-04-24 VITALS — BP 142/82 | HR 58 | Resp 20 | Ht 75.0 in | Wt 173.8 lb

## 2021-04-24 DIAGNOSIS — I10 Essential (primary) hypertension: Secondary | ICD-10-CM

## 2021-04-24 DIAGNOSIS — Z720 Tobacco use: Secondary | ICD-10-CM | POA: Diagnosis not present

## 2021-04-24 DIAGNOSIS — E785 Hyperlipidemia, unspecified: Secondary | ICD-10-CM

## 2021-04-24 DIAGNOSIS — I739 Peripheral vascular disease, unspecified: Secondary | ICD-10-CM | POA: Diagnosis not present

## 2021-04-24 NOTE — Patient Instructions (Signed)

## 2021-04-24 NOTE — Progress Notes (Signed)
Cardiology Office Note   Date:  04/24/2021   ID:  David David 1950-06-08, MRN [id number]  PCP:  [name], [name], MD  Cardiologist:   Kathlyn Sacramento, MD   No chief complaint on file.     History of Present Illness: David David is a 71 y.o. male who presents for a follow-up visit regarding peripheral arterial disease. The patient has lower extremity peripheral arterial disease with recurrent atheroembolic events to the left foot due to distal left popliteal artery stenosis with previous Cypher drug-eluting stent placement which was used in order to treat an ulcerated plaque with distal embolization.  He was seen in 11/2012  for a painless rash involving the left great toe and medial foot, with typical appearance of an athero embolic event. Echocardiogram was unremarkable.  CTA of the chest abdomen and pelvis with runoff to the feet demonstrated scattered atheromatous the aorta but no significant findings.  The left tibioperoneal stent had greater than 70% stenosis. ABI was normal but there was significant dampening in pressure noted in the big toe. I proceeded with angiography which confirmed this and also showed an occluded dorsalis pedis likely from embolization. I performed balloon angioplasty with an Angioscore balloon to both ostial anterior tibial and ostial tibioperoneal trunk.   He had cardiac catheterization in May of 2016 which showed moderate left circumflex stenosis and no evidence of obstructive disease.   He had dizziness and presyncope in the past and thought to have a TIA but neurologic evaluation was overall unremarkable.  His ABI has been consistently normal but there has been gradual increase in velocity on duplex distal to the previously placed stent in the TP trunk.  Velocity was greater than 500 recently.  However, in spite of that he denies claudication or discoloration.  The patient's previous symptoms included distal embolization but currently he does not  have that. He cut down on tobacco use but has not been able to quit smoking.  Past Medical History:  Diagnosis Date   Alcoholism in family    Anxiety    Arthritis    Depressive disorder, not elsewhere classified    Diverticulosis of colon (without mention of hemorrhage)    Dysfunction of eustachian tube    Exposure to STD    Family history of diabetes mellitus    Family history of psychiatric condition    family Hx of suicide    GERD (gastroesophageal reflux disease)    Impotence of organic origin    erectile dysfunction   Lipoprotein deficiencies    Other diseases of lung, not elsewhere classified    pulmonary nodule   Peripheral vascular disease, unspecified ([place])    lower extremity peripheral artery disease   Personal history of colonic polyps    Routine general medical examination at a health care facility    Seizures ([place])    Sprain of neck    Hx   Thyroid disease    Tobacco use disorder    Unspecified hypertensive kidney disease with chronic kidney disease stage I through stage IV, or unspecified(403.90)    cholesterol embolization syndrome    Past Surgical History:  Procedure Laterality Date   angiography     bilateral lower extremity angiography, stenting of the left.    CARDIAC CATHETERIZATION N/A 07/19/2014   Procedure: Left Heart Cath and Coronary Angiography;  Surgeon: Leonie Man, MD;  Location: [name] INVASIVE CV LAB CUPID;  Service: Cardiovascular;  Laterality: N/A;   CATARACT EXTRACTION Bilateral  EYE SURGERY  2016   blown macular   KNEE ARTHROSCOPY Left    x 2   LOWER EXTREMITY ANGIOGRAM N/A 11/04/2012   Procedure: LOWER EXTREMITY ANGIOGRAM;  Surgeon: Wellington Hampshire, MD;  Location: [facility] CATH LAB;  Service: Cardiovascular;  Laterality: N/A;   POPLITEAL ARTERY ANGIOPLASTY     didnt specify angioplasty or stent   SPERMATOCELECTOMY Left    testicular cyst   VASECTOMY       Current Outpatient Medications  Medication Sig Dispense Refill   albuterol  (PROVENTIL HFA;VENTOLIN HFA) 108 (90 Base) MCG/ACT inhaler Inhale 2 puffs into the lungs as needed for wheezing or shortness of breath.     alfuzosin (UROXATRAL) 10 MG 24 hr tablet Take 10 mg by mouth at bedtime.  11   amLODipine (NORVASC) 10 MG tablet Take 5 mg by mouth daily.     ammonium lactate (LAC-HYDRIN) 12 % lotion Apply 1 application topically 2 (two) times daily as needed for dry skin.     aspirin EC 81 MG tablet Take 1 tablet (81 mg total) by mouth daily. 90 tablet 3   clopidogrel (PLAVIX) 75 MG tablet Take 1 tablet (75 mg total) by mouth daily. 30 tablet 3   cyclobenzaprine (FLEXERIL) 10 MG tablet Take 1 tablet (10 mg total) by mouth at bedtime as needed for muscle spasms. 10 tablet 0   dupilumab (DUPIXENT) 300 MG/2ML prefilled syringe INJECT 2 PREFILLED SYRINGES FOR A 600MG  DOSE SUBCUTANEOUSLY ONCE LOADING DOSE     escitalopram (LEXAPRO) 10 MG tablet Take 30 mg by mouth every morning.      gabapentin (NEURONTIN) 300 MG capsule Take 600 mg by mouth 2 (two) times daily.     Gauze Pads & Dressings (GAUZE DRESSING) 4"X4" PADS Use as instructed for wound care. 30 each 1   mirtazapine (REMERON) 15 MG tablet Take 1 tablet (15 mg total) by mouth at bedtime. 30 tablet 2   Multiple Vitamin (MULTI-VITAMIN) tablet Take by mouth. theragran     Omega-3 Fatty Acids (FISH OIL) 1000 MG CAPS Take 1,000 mg by mouth 2 (two) times daily.      omeprazole (PRILOSEC) 40 MG capsule Take 40 mg by mouth daily.     pravastatin (PRAVACHOL) 40 MG tablet Take 40 mg by mouth at bedtime.     sodium chloride 0.9 % nebulizer solution Take 3 mLs by nebulization as needed for wheezing. 90 mL 12   No current facility-administered medications for this visit.    Allergies:   Bupropion, Methylphenidate, Tamsulosin, Tape, Trazodone, Neomycin-bacitracin zn-polymyx, Niacin, and Paroxetine    Social History:  The patient  reports that he has quit smoking. His smoking use included cigarettes. He started smoking about 4 years  ago. He has a 50.00 pack-year smoking history. He has never used smokeless tobacco. He reports current alcohol use. He reports current drug use. Drug: Marijuana.   Family History:  The patient's family history includes Alzheimer's disease in his brother and sister; CAD in his father; Congestive Heart Failure in his mother; Diabetes in his brother; Lung cancer in his brother.    ROS:  Please see the history of present illness.   Otherwise, review of systems are positive for none.   All other systems are reviewed and negative.    PHYSICAL EXAM: VS:  BP (!) 142/82 (BP Location: Left Arm, Patient Position: Sitting, Cuff Size: Normal)    Pulse (!) 58    Resp 20    Ht 6\' 3"  (1.905 m)  Wt 173 lb 12.8 oz (78.8 kg)    SpO2 92%    BMI 21.72 kg/m  , BMI Body mass index is 21.72 kg/m. GEN: Well nourished, well developed, in no acute distress  HEENT: normal  Neck: no JVD, carotid bruits, or masses Cardiac: RRR; no murmurs, rubs, or gallops,no edema  Respiratory:  clear to auscultation bilaterally, normal work of breathing GI: soft, nontender, nondistended, + BS MS: no deformity or atrophy  Skin: warm and dry, no rash Neuro:  Strength and sensation are intact Psych: euthymic mood, full affect Vascular: Dorsalis pedis: +2 on the left.  Posterior tibial is +1.  There is mild bluish discoloration of both feet slightly worse on the left side.  EKG:  EKG is ordered today. EKG showed sinus bradycardia with nonspecific T wave changes.   Recent Labs: No results found for requested labs within last 8760 hours.    Lipid Panel    Component Value Date/Time   CHOL 126 04/17/2017 0344   TRIG 150 (H) 04/17/2017 0344   HDL 36 (L) 04/17/2017 0344   CHOLHDL 3.5 04/17/2017 0344   VLDL 30 04/17/2017 0344   LDLCALC 60 04/17/2017 0344      Wt Readings from Last 3 Encounters:  04/24/21 173 lb 12.8 oz (78.8 kg)  11/23/20 166 lb 6.4 oz (75.5 kg)  04/18/20 178 lb 6.4 oz (80.9 kg)      No flowsheet data  found.    ASSESSMENT AND PLAN:  1.  Peripheral arterial disease: Currently with no claudication or ulceration.  Vascular studies did show evidence of significant restenosis just distal to the previously placed stent in the popliteal artery mostly towards the TP trunk.  In spite of that, the patient is asymptomatic with no evidence of distal embolization as he had in the past.  In addition, he has palpable pulses by exam.   Thus, we will continue close observation for now.  He will notify me if he develops any symptoms.  Otherwise I will see him back in 6 months.    2. Tobacco use:  Unfortunately, he is smoking again.  I had a prolonged discussion with him about the importance of smoking cessation.   3. Essential hypertension: Blood pressure is reasonably controlled.  4. Hyperlipidemia: Continue treatment with pravastatin with a target LDL of less than 70.    Most recent LDL was 60.    Disposition:   FU with me in 6 months.  Signed,  Kathlyn Sacramento, MD  04/24/2021 8:56 AM    [place]

## 2021-05-08 ENCOUNTER — Other Ambulatory Visit (HOSPITAL_COMMUNITY): Payer: Self-pay | Admitting: Cardiovascular Disease

## 2021-05-08 DIAGNOSIS — I739 Peripheral vascular disease, unspecified: Secondary | ICD-10-CM

## 2021-05-08 DIAGNOSIS — Z9862 Peripheral vascular angioplasty status: Secondary | ICD-10-CM

## 2021-08-03 IMAGING — MR MR CERVICAL SPINE W/O CM
5 series · 29 of 48 positions shown · non-contrast
Comparison: CT 04/16/2017

CLINICAL DATA: Chronic neck pain

EXAM:
MRI CERVICAL SPINE WITHOUT CONTRAST
TECHNIQUE: Multiplanar, multisequence MR imaging of the cervical spine was
performed. No intravenous contrast was administered.

[Series 2: T2 · sagittal · 3.0mm · 0.41mm/px · 6 of 13 slices shown (1 of 2)]
[im 1/13]
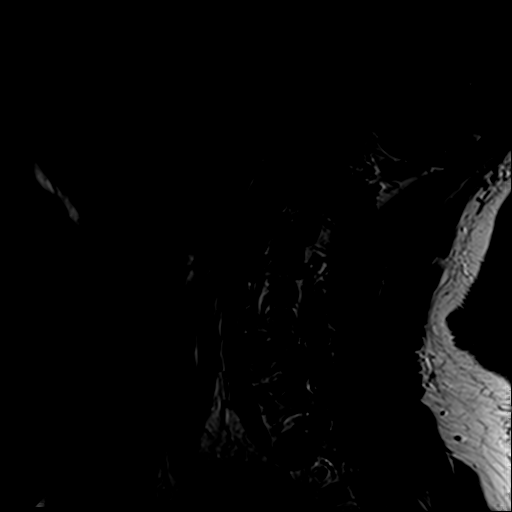
[im 3/13]
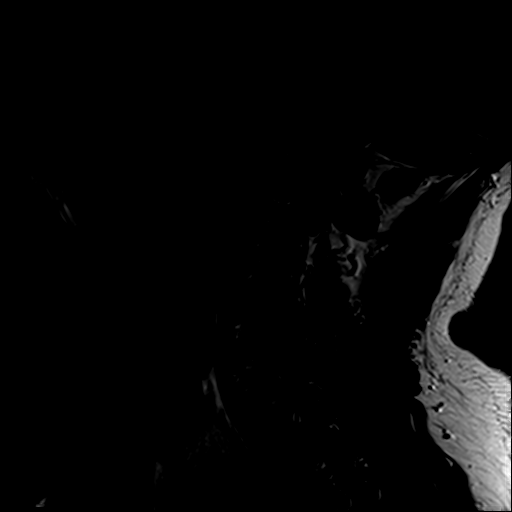
[im 5/13]
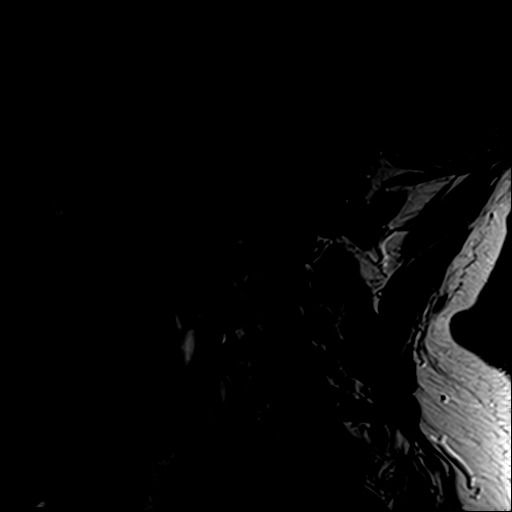
[im 8/13]
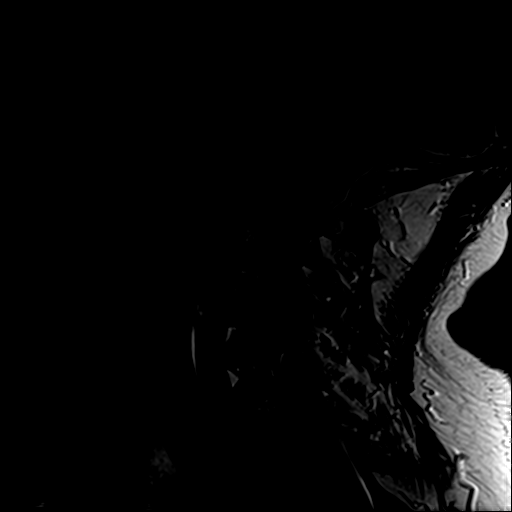
[im 10/13]
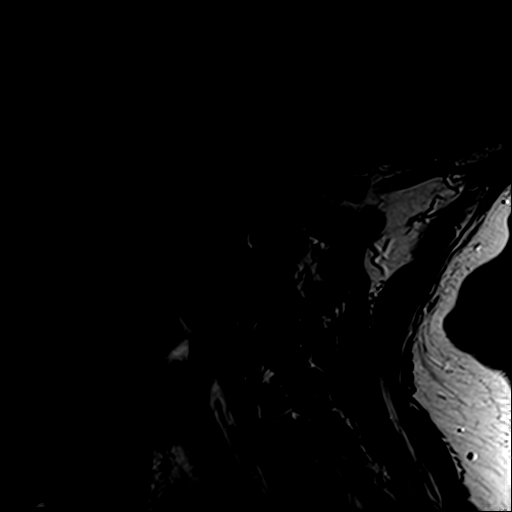
[im 13/13]
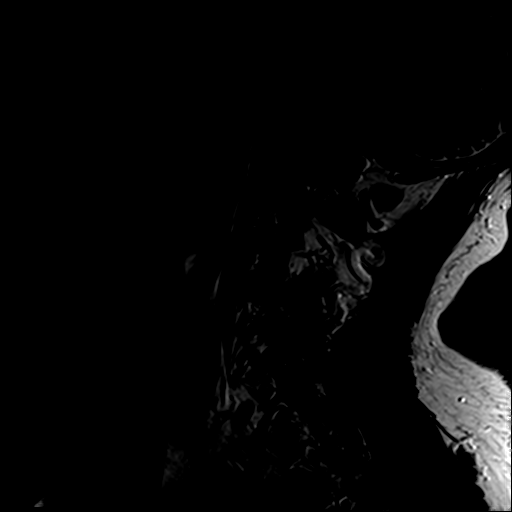

[Series 3: T1 · sagittal · 3.0mm · 0.41mm/px · 6 of 13 slices shown]
[im 1/13]
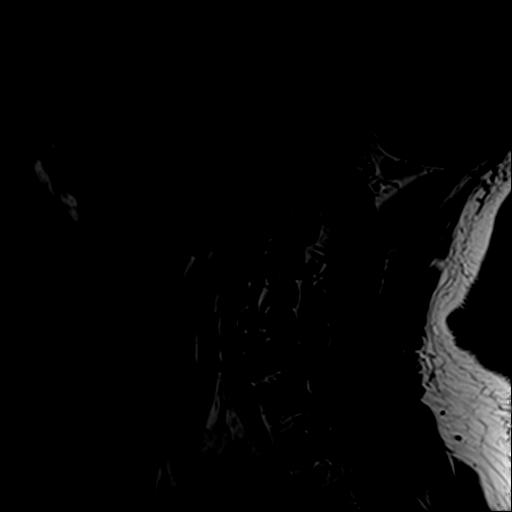
[im 3/13]
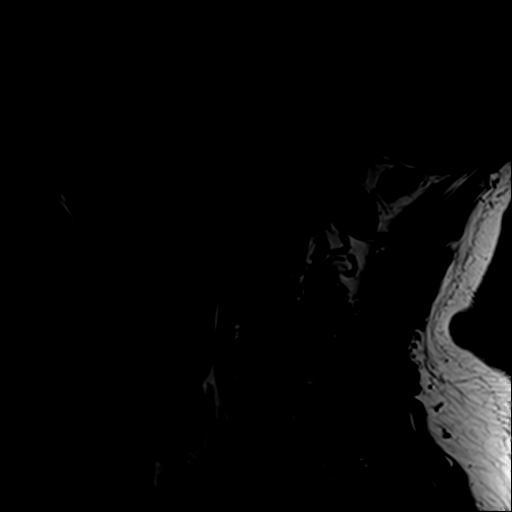
[im 5/13]
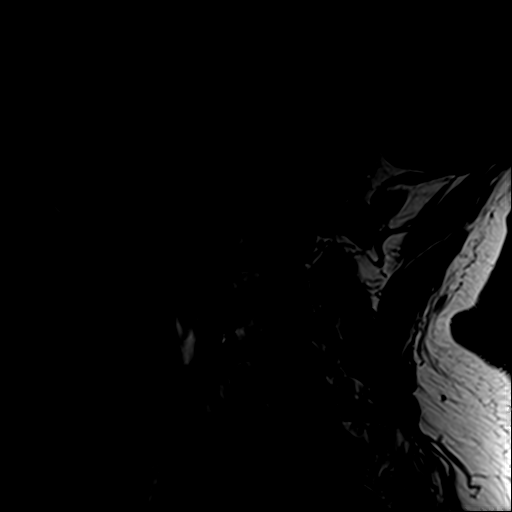
[im 8/13]
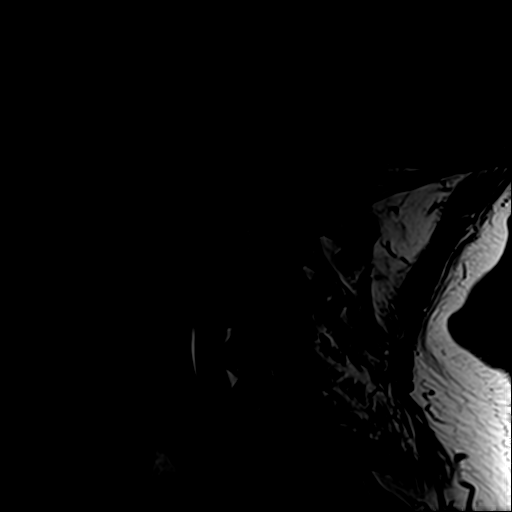
[im 10/13]
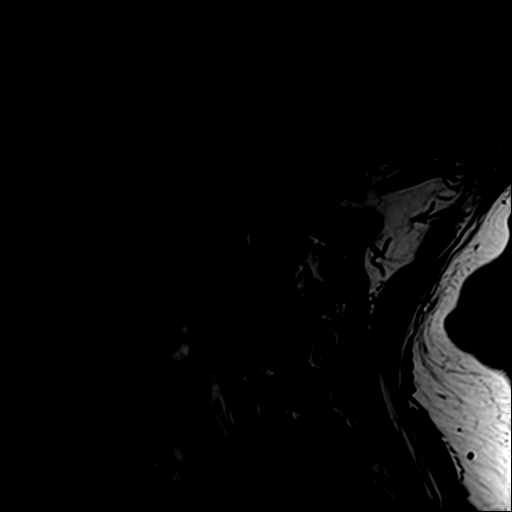
[im 13/13]
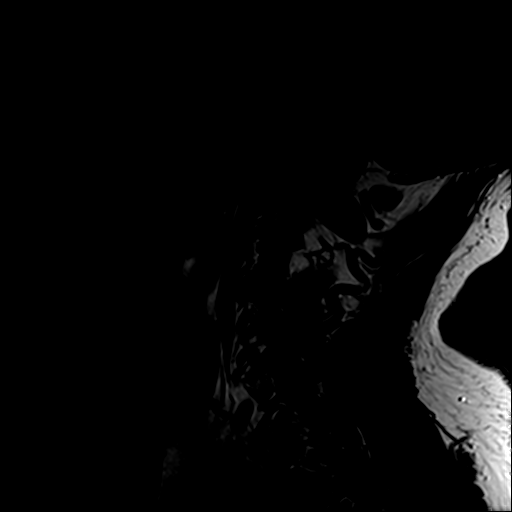

[Series 4: STIR · sagittal · 3.0mm · 0.82mm/px · 6 of 13 slices shown]
[im 1/13]
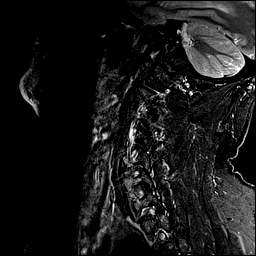
[im 3/13]
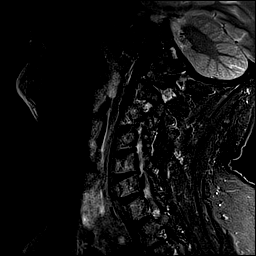
[im 5/13]
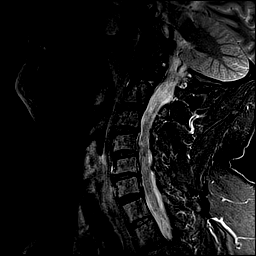
[im 8/13]
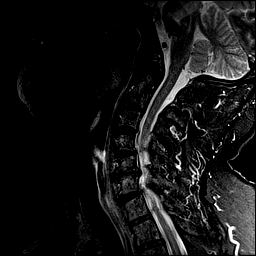
[im 10/13]
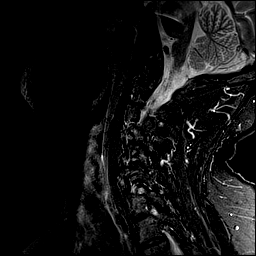
[im 13/13]
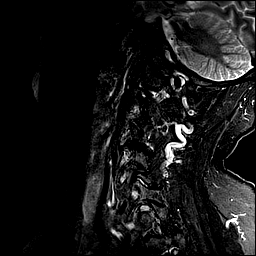

[Series 5: GRE · axial · 3.0mm · 0.35mm/px · z∈[-98,-84]mm · 2 of 30 slices shown]
[im 1/30]
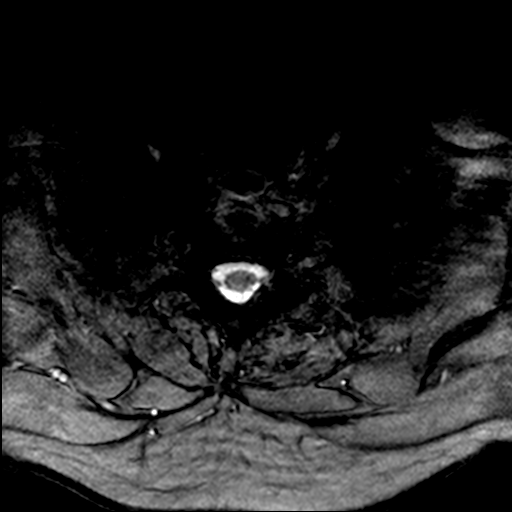
[im 5/30]
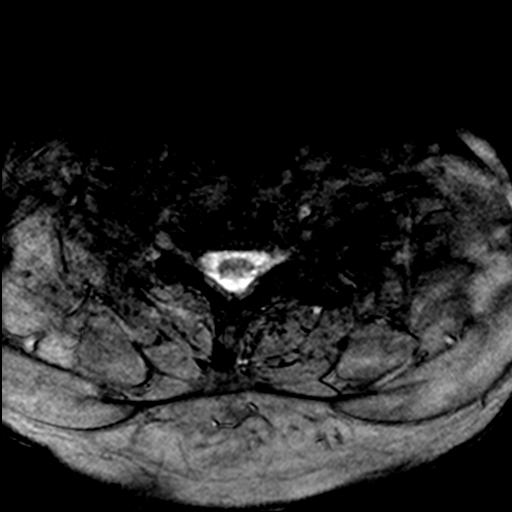

[Series 6: T2 · axial · 3.0mm · 0.70mm/px · z∈[-98,+6]mm · 9 of 30 slices shown (2 of 2)]
[im 1/30]
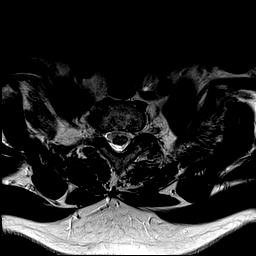
[im 5/30]
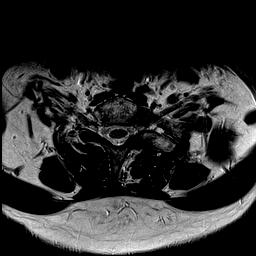
[im 9/30]
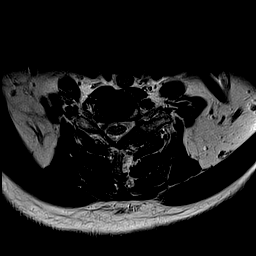
[im 13/30]
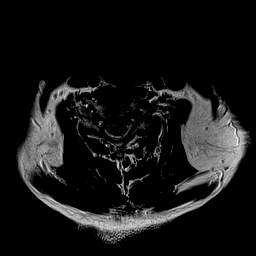
[im 15/30]
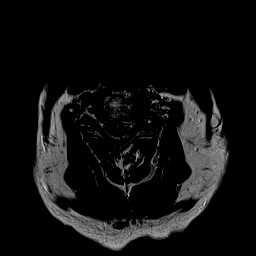
[im 17/30]
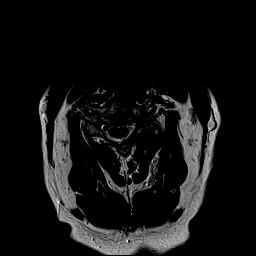
[im 21/30]
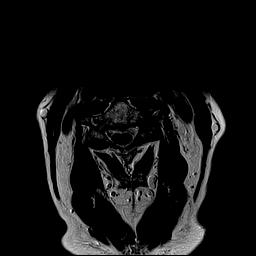
[im 25/30]
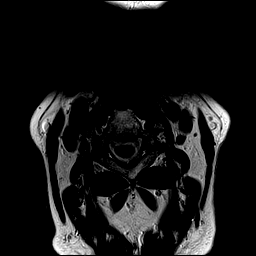
[im 30/30]
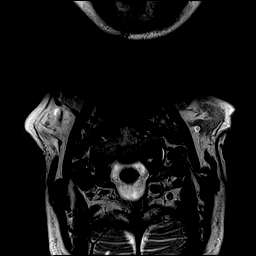

[29 of 48 positions shown; findings below may reference images not displayed]

FINDINGS: Alignment: Hyperlordotic curvature, which may be positional. Trace
anterolisthesis C4 on C5.

Vertebrae: No fracture, evidence of discitis, or bone lesion.
Discogenic endplate marrow changes most pronounced at C5-6.

Cord: Normal signal and morphology.

Posterior Fossa, vertebral arteries, paraspinal tissues: Negative.

Disc levels:

C2-C3: Left paracentral disc osteophyte complex and bilateral facet
arthropathy results in mild left foraminal stenosis. No canal
stenosis.

C3-C4: Posterior disc osteophyte complex with bilateral facet and
uncovertebral arthropathy results in mild-to-moderate right and mild
left foraminal stenosis. No canal stenosis.

C4-C5: Minimal posterior disc osteophyte complex with left greater
than right facet and uncovertebral arthropathy results in moderate
left foraminal stenosis without canal stenosis.

C5-C6: Posterior disc osteophyte complex with bilateral facet and
uncovertebral arthropathy results in moderate to severe left
foraminal stenosis, mild right foraminal stenosis, and mild canal
stenosis.

C6-C7: Minimal posterior disc osteophyte complex with left greater
than right facet and uncovertebral arthropathy result in minimal
right foraminal narrowing. No canal stenosis.

C7-T1: No significant disc protrusion, foraminal stenosis, or canal
stenosis.
IMPRESSION: Multilevel cervical spondylosis, as detailed above. Findings are
most pronounced at the C5-6 level where there is moderate-to-severe
left foraminal stenosis, mild right foraminal stenosis, and mild
canal stenosis.

## 2021-11-01 ENCOUNTER — Ambulatory Visit: Payer: Medicare Other | Admitting: Orthopedic Surgery

## 2021-11-01 ENCOUNTER — Encounter: Payer: Self-pay | Admitting: Orthopedic Surgery

## 2021-11-01 ENCOUNTER — Encounter: Payer: Self-pay | Admitting: Internal Medicine

## 2021-11-01 DIAGNOSIS — I739 Peripheral vascular disease, unspecified: Secondary | ICD-10-CM | POA: Diagnosis not present

## 2021-11-01 DIAGNOSIS — L97511 Non-pressure chronic ulcer of other part of right foot limited to breakdown of skin: Secondary | ICD-10-CM
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
## 2021-11-06 ENCOUNTER — Encounter: Payer: Self-pay | Admitting: Cardiovascular Disease

## 2021-11-06 ENCOUNTER — Ambulatory Visit (INDEPENDENT_AMBULATORY_CARE_PROVIDER_SITE_OTHER): Payer: Medicare Other | Admitting: Cardiovascular Disease

## 2021-11-06 VITALS — BP 130/74 | HR 59 | Ht 74.5 in | Wt 163.0 lb

## 2021-11-06 DIAGNOSIS — Z01818 Encounter for other preprocedural examination: Secondary | ICD-10-CM | POA: Diagnosis not present

## 2021-11-06 DIAGNOSIS — E785 Hyperlipidemia, unspecified: Secondary | ICD-10-CM

## 2021-11-06 DIAGNOSIS — I10 Essential (primary) hypertension: Secondary | ICD-10-CM

## 2021-11-06 DIAGNOSIS — I739 Peripheral vascular disease, unspecified: Secondary | ICD-10-CM | POA: Diagnosis not present

## 2021-11-06 DIAGNOSIS — Z720 Tobacco use: Secondary | ICD-10-CM | POA: Diagnosis not present

## 2021-11-06 MED ORDER — SODIUM CHLORIDE 0.9% FLUSH: 3.0000 mL | Freq: Two times a day (BID) | INTRAVENOUS | Status: AC
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
## 2021-11-06 NOTE — Patient Instructions (Addendum)
Medication Instructions:  NO CHANGES  *If you need a refill on your cardiac medications before your next appointment, please call your pharmacy*   Lab Work: BMET/CBC today   If you have labs (blood work) drawn today and your tests are completely normal, you will receive your results only by: [place] (if you have MyChart) OR A paper copy in the mail If you have any lab test that is abnormal or we need to change your treatment, we will call you to review the results.   Testing/Procedures: Angiogram tomorrow 11/07/21 with Dr. Fletcher David   Follow-Up: At [facility], you and your health needs are our priority.  As part of our continuing mission to provide you with exceptional heart care, we have created designated Provider Care Teams.  These Care Teams include your primary Cardiologist (physician) and Advanced Practice Providers (APPs -  Physician Assistants and Nurse Practitioners) who all work together to provide you with the care you need, when you need it.  We recommend signing up for the patient portal called "MyChart".  Sign up information is provided on this After Visit Summary.  MyChart is used to connect with patients for Virtual Visits (Telemedicine).  Patients are able to view lab/test results, encounter notes, upcoming appointments, etc.  Non-urgent messages can be sent to your provider as well.   To learn more about what you can do with MyChart, go to [email or web address].    Your next appointment:   1 month(s)  The format for your next appointment:   In Person  Provider:   Kathlyn Sacramento, MD {   Other Instructions  David David [address] [place] [address] Dept: [phone] Loc: [place]  11/06/2021  You are scheduled for a Peripheral Angiogram on Wednesday, August 23 with Dr. Kathlyn David.  1. Please arrive at the Main Entrance A at [facility]: [place], [place] [address] at 8:30 AM (This time is two hours before your procedure to ensure your preparation). Free valet parking service is available.   Special note: Every effort is made to have your procedure done on time. Please understand that emergencies sometimes delay scheduled procedures.  2. Diet: Do not eat solid foods after midnight.  You may have clear liquids until 5 AM upon the day of the procedure.  3. Labs: BMET & CBC today  4. Medication instructions in preparation for your procedure:    On the morning of your procedure, take Aspirin and Plavix and any morning medicines NOT listed above.  You may use sips of water.  5. Plan to go home the same day, you will only stay overnight if medically necessary. 6. You MUST have a responsible adult to drive you home. 7. An adult MUST be with you the first 24 hours after you arrive home. 8. Bring a current list of your medications, and the last time and date medication taken. 9. Bring ID and current insurance cards. 10.Please wear clothes that are easy to get on and off and wear slip-on shoes.  Thank you for allowing [place] to care for you!   -- [facility]

## 2021-11-07 ENCOUNTER — Other Ambulatory Visit: Payer: Self-pay

## 2021-11-07 ENCOUNTER — Ambulatory Visit (HOSPITAL_COMMUNITY)
Admission: RE | Admit: 2021-11-07 | Discharge: 2021-11-07 | Disposition: A | Discharge status: Home / Self Care | Payer: Medicare Other | Attending: Cardiovascular Disease | Admitting: Cardiovascular Disease

## 2021-11-07 ENCOUNTER — Encounter (HOSPITAL_COMMUNITY)
Admission: RE | Disposition: A | Discharge status: Home / Self Care | Payer: Self-pay | Source: Home / Self Care | Attending: Cardiovascular Disease

## 2021-11-07 DIAGNOSIS — I10 Essential (primary) hypertension: Secondary | ICD-10-CM | POA: Insufficient documentation

## 2021-11-07 DIAGNOSIS — E785 Hyperlipidemia, unspecified: Secondary | ICD-10-CM | POA: Insufficient documentation

## 2021-11-07 DIAGNOSIS — Z79899 Other long term (current) drug therapy: Secondary | ICD-10-CM | POA: Insufficient documentation

## 2021-11-07 DIAGNOSIS — F17210 Nicotine dependence, cigarettes, uncomplicated: Secondary | ICD-10-CM | POA: Diagnosis not present

## 2021-11-07 DIAGNOSIS — I739 Peripheral vascular disease, unspecified: Secondary | ICD-10-CM

## 2021-11-07 DIAGNOSIS — I708 Atherosclerosis of other arteries: Secondary | ICD-10-CM | POA: Insufficient documentation

## 2021-11-07 HISTORY — PX: ABDOMINAL AORTOGRAM W/LOWER EXTREMITY: CATH118223

## 2021-11-07 LAB — BASIC METABOLIC PANEL
BUN/Creatinine Ratio: 11 (ref 10–24)
BUN: 11 mg/dL (ref 8–27)
CO2: 25 mmol/L (ref 20–29)
Calcium: 9.7 mg/dL (ref 8.6–10.2)
Chloride: 103 mmol/L (ref 96–106)
Creatinine, Ser: 0.99 mg/dL (ref 0.76–1.27)
Glucose: 93 mg/dL (ref 70–99)
Potassium: 5.1 mmol/L (ref 3.5–5.2)
Sodium: 142 mmol/L (ref 134–144)
eGFR: 81 mL/min/{1.73_m2} (ref >59)

## 2021-11-07 LAB — CBC
Hematocrit: 44.5 % (ref 37.5–51.0)
Hemoglobin: 15 g/dL (ref 13.0–17.7)
MCH: 30.1 pg (ref 26.6–33.0)
MCHC: 33.7 g/dL (ref 31.5–35.7)
MCV: 89 fL (ref 79–97)
Platelets: 162 10*3/uL (ref 150–450)
RBC: 4.99 x10E6/uL (ref 4.14–5.80)
RDW: 14.5 % (ref 11.6–15.4)
WBC: 9.6 10*3/uL (ref 3.4–10.8)

## 2021-11-07 SURGERY — ABDOMINAL AORTOGRAM W/LOWER EXTREMITY
Anesthesia: LOCAL

## 2021-11-07 MED ORDER — ASPIRIN 81 MG PO CHEW
81.0000 mg | CHEWABLE_TABLET | ORAL | Status: AC
  Administered 2021-11-07: 81 mg via ORAL

## 2021-11-07 MED ORDER — LIDOCAINE HCL (PF) 1 % IJ SOLN
INTRAMUSCULAR | Status: AC
Start: 2021-11-07 — End: ?
  Filled 2021-11-07: qty 30

## 2021-11-07 MED ORDER — HEPARIN (PORCINE) IN NACL 1000-0.9 UT/500ML-% IV SOLN
INTRAVENOUS | Status: AC
  Filled 2021-11-07: qty 1000

## 2021-11-07 MED ORDER — SODIUM CHLORIDE 0.9 % IV SOLN: 250.0000 mL | INTRAVENOUS | Status: DC | PRN

## 2021-11-07 MED ORDER — SODIUM CHLORIDE 0.9 % WEIGHT BASED INFUSION
3.0000 mL/kg/h | INTRAVENOUS | Status: AC
  Administered 2021-11-07: 3 mL/kg/h via INTRAVENOUS

## 2021-11-07 MED ORDER — HEPARIN (PORCINE) IN NACL 1000-0.9 UT/500ML-% IV SOLN
INTRAVENOUS | Status: DC | PRN
  Administered 2021-11-07 (×2): 500 mL

## 2021-11-07 MED ORDER — MIDAZOLAM HCL 2 MG/2ML IJ SOLN
INTRAMUSCULAR | Status: AC
  Filled 2021-11-07: qty 2

## 2021-11-07 MED ORDER — ACETAMINOPHEN 325 MG PO TABS: 650.0000 mg | ORAL_TABLET | ORAL | Status: DC | PRN

## 2021-11-07 MED ORDER — FENTANYL CITRATE (PF) 100 MCG/2ML IJ SOLN
INTRAMUSCULAR | Status: DC | PRN
  Administered 2021-11-07: 50 ug via INTRAVENOUS

## 2021-11-07 MED ORDER — SODIUM CHLORIDE 0.9% FLUSH: 3.0000 mL | INTRAVENOUS | Status: DC | PRN

## 2021-11-07 MED ORDER — SODIUM CHLORIDE 0.9 % WEIGHT BASED INFUSION: 1.0000 mL/kg/h | INTRAVENOUS | Status: DC

## 2021-11-07 MED ORDER — MIDAZOLAM HCL 2 MG/2ML IJ SOLN
INTRAMUSCULAR | Status: DC | PRN
  Administered 2021-11-07: 1 mg via INTRAVENOUS

## 2021-11-07 MED ORDER — SODIUM CHLORIDE 0.9% FLUSH: 3.0000 mL | Freq: Two times a day (BID) | INTRAVENOUS | Status: DC

## 2021-11-07 MED ORDER — FENTANYL CITRATE (PF) 100 MCG/2ML IJ SOLN
INTRAMUSCULAR | Status: AC
  Filled 2021-11-07: qty 2

## 2021-11-07 MED ORDER — ONDANSETRON HCL 4 MG/2ML IJ SOLN: 4.0000 mg | Freq: Four times a day (QID) | INTRAMUSCULAR | Status: DC | PRN

## 2021-11-07 MED ORDER — LIDOCAINE HCL (PF) 1 % IJ SOLN
INTRAMUSCULAR | Status: DC | PRN
  Administered 2021-11-07: 15 mL

## 2021-11-07 MED ORDER — SODIUM CHLORIDE 0.9 % IV SOLN: INTRAVENOUS | Status: DC

## 2021-11-07 MED ORDER — HYDRALAZINE HCL 20 MG/ML IJ SOLN: 5.0000 mg | INTRAMUSCULAR | Status: DC | PRN

## 2021-11-07 MED ORDER — IODIXANOL 320 MG/ML IV SOLN
INTRAVENOUS | Status: DC | PRN
  Administered 2021-11-07: 85 mL

## 2021-11-07 SURGICAL SUPPLY — 14 items
CATH ANGIO 5F PIGTAIL 65CM (CATHETERS) IMPLANT
CATH CROSS OVER TEMPO 5F (CATHETERS) IMPLANT
CATH STRAIGHT 5FR 65CM (CATHETERS) IMPLANT
DEVICE CLOSURE MYNXGRIP 5F (Vascular Products) IMPLANT
KIT MICROPUNCTURE NIT STIFF (SHEATH) IMPLANT
KIT PV (KITS) ×2 IMPLANT
SHEATH PINNACLE 5F 10CM (SHEATH) IMPLANT
SHEATH PROBE COVER 6X72 (BAG) IMPLANT
STOPCOCK MORSE 400PSI 3WAY (MISCELLANEOUS) IMPLANT
SYR MEDRAD MARK 7 150ML (SYRINGE) ×2 IMPLANT
TRANSDUCER W/STOPCOCK (MISCELLANEOUS) ×2 IMPLANT
TRAY PV CATH (CUSTOM PROCEDURE TRAY) ×2 IMPLANT
TUBING CIL FLEX 10 FLL-RA (TUBING) IMPLANT
WIRE HITORQ VERSACORE ST 145CM (WIRE) IMPLANT

## 2021-11-07 NOTE — Interval H&P Note (Signed)
History and Physical Interval Note:  11/07/2021 10:23 AM  David David  has presented today for surgery, with the diagnosis of pad.  The various methods of treatment have been discussed with the patient and family. After consideration of risks, benefits and other options for treatment, the patient has consented to  Procedure(s): ABDOMINAL AORTOGRAM W/LOWER EXTREMITY (N/A) as a surgical intervention.  The patient's history has been reviewed, patient examined, no change in status, stable for surgery.  I have reviewed the patient's chart and labs.  Questions were answered to the patient's satisfaction.     Kathlyn Sacramento

## 2021-11-07 NOTE — Progress Notes (Signed)
Patient and daughter was given discharge instructions. Both verbalized understanding. 

## 2021-11-08 ENCOUNTER — Telehealth: Payer: Self-pay

## 2021-11-08 ENCOUNTER — Other Ambulatory Visit (HOSPITAL_BASED_OUTPATIENT_CLINIC_OR_DEPARTMENT_OTHER): Payer: Self-pay

## 2021-11-08 ENCOUNTER — Encounter (HOSPITAL_COMMUNITY): Payer: Self-pay | Admitting: Cardiovascular Disease

## 2021-11-08 ENCOUNTER — Other Ambulatory Visit: Payer: Self-pay

## 2021-11-08 ENCOUNTER — Other Ambulatory Visit: Payer: Self-pay | Admitting: Orthopedic Surgery

## 2021-11-08 MED ORDER — NITROGLYCERIN 0.2 MG/HR TD PT24: 0.2000 mg | MEDICATED_PATCH | Freq: Every day | TRANSDERMAL | 12 refills | Status: DC

## 2021-11-08 MED ORDER — NITROGLYCERIN 0.2 MG/HR TD PT24
0.2000 mg | MEDICATED_PATCH | Freq: Every day | TRANSDERMAL | 12 refills | Status: DC
  Filled 2021-11-08: qty 30, 30d supply, fill #0

## 2021-11-08 NOTE — Telephone Encounter (Signed)
Pt's daughter is aware and will call with questions.

## 2021-11-08 NOTE — Telephone Encounter (Signed)
Pt is sch for follow up in the office on Thursday. Has a small open area on the dorsum of his foot. Had discussed using nitro patch behind medial ankle. Needs rx sent to [facility] [address] in [place].

## 2021-11-09 ENCOUNTER — Other Ambulatory Visit (HOSPITAL_BASED_OUTPATIENT_CLINIC_OR_DEPARTMENT_OTHER): Payer: Self-pay

## 2021-11-12 ENCOUNTER — Ambulatory Visit: Payer: Medicare Other | Admitting: Orthopedic Surgery

## 2021-11-12 ENCOUNTER — Encounter: Payer: Self-pay | Admitting: Orthopedic Surgery

## 2021-11-12 DIAGNOSIS — I739 Peripheral vascular disease, unspecified: Secondary | ICD-10-CM

## 2021-11-12 DIAGNOSIS — L97511 Non-pressure chronic ulcer of other part of right foot limited to breakdown of skin: Secondary | ICD-10-CM
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
## 2021-11-14 ENCOUNTER — Encounter: Payer: Self-pay | Admitting: Internal Medicine

## 2021-11-14 ENCOUNTER — Ambulatory Visit (INDEPENDENT_AMBULATORY_CARE_PROVIDER_SITE_OTHER): Payer: No Typology Code available for payment source | Admitting: Internal Medicine

## 2021-11-14 VITALS — BP 124/68 | HR 76 | Ht 75.0 in | Wt 164.2 lb

## 2021-11-14 DIAGNOSIS — Z86010 Personal history of colonic polyps: Secondary | ICD-10-CM | POA: Diagnosis not present

## 2021-11-14 DIAGNOSIS — R634 Abnormal weight loss: Secondary | ICD-10-CM

## 2021-11-14 DIAGNOSIS — K219 Gastro-esophageal reflux disease without esophagitis: Secondary | ICD-10-CM

## 2021-11-14 DIAGNOSIS — R1310 Dysphagia, unspecified: Secondary | ICD-10-CM | POA: Diagnosis not present

## 2021-11-14 MED ORDER — NA SULFATE-K SULFATE-MG SULF 17.5-3.13-1.6 GM/177ML PO SOLN: 1.0000 | Freq: Once | ORAL | 0 refills | Status: AC

## 2021-11-14 NOTE — Patient Instructions (Signed)
If you are age 72 or older, your body mass index should be between 23-30. Your Body mass index is 20.52 kg/m. If this is out of the aforementioned range listed, please consider follow up with your Primary Care Provider.  If you are age 9 or younger, your body mass index should be between 19-25. Your Body mass index is 20.52 kg/m. If this is out of the aformentioned range listed, please consider follow up with your Primary Care Provider.   You have been scheduled for an endoscopy and colonoscopy. Please follow the written instructions given to you at your visit today. Please pick up your prep supplies at the pharmacy within the next 1-3 days. If you use inhalers (even only as needed), please bring them with you on the day of your procedure.   You have been scheduled for a CT scan of the abdomen and pelvis at [facility], 1st floor Radiology. You are scheduled on 11/26/21 at 1:30pm. You should arrive 15 minutes prior to your appointment time for registration.  We are giving you 2 bottles of contrast today that you will need to drink before arriving for the exam. The solution may taste better if refrigerated so put them in the refrigerator when you get home, but do NOT add ice or any other liquid to this solution as that would dilute it. Shake well before drinking.   Please follow the written instructions below on the day of your exam:   1) Do not eat anything after 9:30am (4 hours prior to your test)   2) Drink 1 bottle of contrast @ 11:30am (2 hours prior to your exam)  Remember to shake well before drinking and do NOT pour over ice.     Drink 1 bottle of contrast @ 12:30pm (1 hour prior to your exam)   You may take any medications as prescribed with a small amount of water, if necessary. If you take any of the following medications: METFORMIN, GLUCOPHAGE, GLUCOVANCE, AVANDAMET, RIOMET, FORTAMET, [place] MET, JANUMET, GLUMETZA or METAGLIP, you MAY be asked to HOLD this medication 48 hours  AFTER the exam.   The purpose of you drinking the oral contrast is to aid in the visualization of your intestinal tract. The contrast solution may cause some diarrhea. Depending on your individual set of symptoms, you may also receive an intravenous injection of x-ray contrast/dye. Plan on being at [name] for 45 minutes or longer, depending on the type of exam you are having performed.   If you have any questions regarding your exam or if you need to reschedule, you may call [facility] at [phone] between the hours of 8:00 am and 5:00 pm, Monday-Friday.     The [facility] providers would like to encourage you to use [facility] to communicate with providers for non-urgent requests or questions.  Due to long hold times on the telephone, sending your provider a message by [facility] may be a faster and more efficient way to get a response.  Please allow 48 business hours for a response.  Please remember that this is for non-urgent requests.
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
## 2021-11-26 ENCOUNTER — Ambulatory Visit (HOSPITAL_COMMUNITY)
Admission: RE | Admit: 2021-11-26 | Discharge: 2021-11-26 | Disposition: A | Discharge status: Home / Self Care | Payer: Medicare Other | Source: Ambulatory Visit | Attending: Internal Medicine | Admitting: Internal Medicine

## 2021-11-26 DIAGNOSIS — R634 Abnormal weight loss: Secondary | ICD-10-CM | POA: Insufficient documentation

## 2021-11-26 DIAGNOSIS — Z86010 Personal history of colonic polyps: Secondary | ICD-10-CM | POA: Diagnosis not present

## 2021-11-26 DIAGNOSIS — R1310 Dysphagia, unspecified: Secondary | ICD-10-CM | POA: Diagnosis not present

## 2021-11-26 DIAGNOSIS — K219 Gastro-esophageal reflux disease without esophagitis: Secondary | ICD-10-CM | POA: Diagnosis not present

## 2021-11-26 MED ORDER — IOHEXOL 300 MG/ML  SOLN
100.0000 mL | Freq: Once | INTRAMUSCULAR | Status: AC | PRN
  Administered 2021-11-26: 100 mL via INTRAVENOUS

## 2021-11-26 MED ORDER — SODIUM CHLORIDE (PF) 0.9 % IJ SOLN
INTRAMUSCULAR | Status: AC
  Filled 2021-11-26: qty 50

## 2021-11-27 ENCOUNTER — Other Ambulatory Visit: Payer: Self-pay

## 2021-11-27 DIAGNOSIS — R918 Other nonspecific abnormal finding of lung field: Secondary | ICD-10-CM

## 2021-12-11 ENCOUNTER — Encounter: Payer: Self-pay | Admitting: Cardiovascular Disease

## 2021-12-11 ENCOUNTER — Ambulatory Visit: Payer: Non-veteran care | Attending: Cardiovascular Disease | Admitting: Cardiovascular Disease

## 2021-12-11 VITALS — BP 146/84 | HR 57 | Ht 75.0 in | Wt 162.2 lb

## 2021-12-11 DIAGNOSIS — E785 Hyperlipidemia, unspecified: Secondary | ICD-10-CM | POA: Diagnosis not present

## 2021-12-11 DIAGNOSIS — Z720 Tobacco use: Secondary | ICD-10-CM

## 2021-12-11 DIAGNOSIS — I10 Essential (primary) hypertension: Secondary | ICD-10-CM

## 2021-12-11 DIAGNOSIS — I739 Peripheral vascular disease, unspecified: Secondary | ICD-10-CM | POA: Diagnosis not present
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
## 2021-12-11 NOTE — Patient Instructions (Signed)
Medication Instructions:  Your physician recommends that you continue on your current medications as directed. Please refer to the Current Medication list given to you today.  *If you need a refill on your cardiac medications before your next appointment, please call your pharmacy*   Lab Work: None   Testing/Procedures: None   Follow-Up: At [facility], you and your health needs are our priority.  As part of our continuing mission to provide you with exceptional heart care, we have created designated Provider Care Teams.  These Care Teams include your primary Cardiologist (physician) and Advanced Practice Providers (APPs -  Physician Assistants and Nurse Practitioners) who all work together to provide you with the care you need, when you need it.  We recommend signing up for the patient portal called "MyChart".  Sign up information is provided on this After Visit Summary.  MyChart is used to connect with patients for Virtual Visits (Telemedicine).  Patients are able to view lab/test results, encounter notes, upcoming appointments, etc.  Non-urgent messages can be sent to your provider as well.   To learn more about what you can do with MyChart, go to [email or web address].    Your next appointment:   6 month(s)  The format for your next appointment:   In Person  Provider:   Kathlyn Sacramento, MD     Other Instructions   Important Information About Sugar

## 2021-12-13 ENCOUNTER — Ambulatory Visit (INDEPENDENT_AMBULATORY_CARE_PROVIDER_SITE_OTHER): Payer: Medicare Other | Admitting: Orthopedic Surgery

## 2021-12-13 DIAGNOSIS — M6701 Short Achilles tendon (acquired), right ankle: Secondary | ICD-10-CM | POA: Diagnosis not present

## 2021-12-13 DIAGNOSIS — I739 Peripheral vascular disease, unspecified: Secondary | ICD-10-CM | POA: Diagnosis not present

## 2021-12-13 DIAGNOSIS — L97511 Non-pressure chronic ulcer of other part of right foot limited to breakdown of skin: Secondary | ICD-10-CM

## 2021-12-25 ENCOUNTER — Encounter: Payer: Self-pay | Admitting: Orthopedic Surgery

## 2021-12-25 NOTE — Progress Notes (Signed)
Office Visit Note   Patient: David David           Date of Birth: Nov 22, 1950           MRN: [id number] Visit Date: 12/13/2021              Requested by: [name], [name], MD [address] [place],  [place] [address] PCP: [name], [name], MD  Chief Complaint  Patient presents with   Right Foot - Wound Check      HPI: Patient is a 71 year old gentleman who presents in follow-up for ischemic changes right foot.  No vascular intervention available.  He is currently using a nitroglycerin patch.  Assessment & Plan: Visit Diagnoses:  1. Non-pressure chronic ulcer of other part of right foot limited to breakdown of skin ([place])   2. PVD (peripheral vascular disease) ([place])     Plan: Patient has shown good interval healing of the ulcers.  Discussed that he can discontinue the nitroglycerin patch.  Recommended Achilles stretching and stiff soled sneakers.  Follow-Up Instructions: Return in about 4 weeks (around 01/10/2022).   Ortho Exam  Patient is alert, oriented, no adenopathy, well-dressed, normal affect, normal respiratory effort. Examination patient has a palpable dorsalis pedis and posterior tibial pulse.  He has callus beneath the second metatarsal head that was pared without complications.  There is no cellulitis no gangrenous changes no open ulcers.  He does have Achilles contracture with dorsiflexion only to neutral with his knee extended.  Imaging: No results found. No images are attached to the encounter.  Labs: Lab Results  Component Value Date   HGBA1C 6.1 (H) 04/17/2017   HGBA1C 5.6 07/29/2006   ESRSEDRATE 28 (H) 07/29/2006   LABURIC 6.5 07/29/2006   REPTSTATUS 06/08/2019 FINAL 06/03/2019   GRAMSTAIN Few 05/28/2015   GRAMSTAIN WBC present-predominately PMN 05/28/2015   GRAMSTAIN No Squamous Epithelial Cells Seen 05/28/2015   GRAMSTAIN No Organisms Seen 05/28/2015   CULT  06/03/2019    NO GROWTH 5 DAYS Performed at [facility], [address],  [place], [place] [address]    [place] SKIN FLORA 05/28/2015     Lab Results  Component Value Date   ALBUMIN 3.9 04/16/2017   ALBUMIN 4.1 05/15/2015   ALBUMIN 3.9 01/30/2014    No results found for: "MG" Lab Results  Component Value Date   VD25OH 51.95 07/01/2016    No results found for: "PREALBUMIN"    Latest Ref Rng & Units 11/06/2021    8:39 AM 06/03/2019   10:19 PM 12/11/2018    1:10 PM  CBC EXTENDED  WBC 3.4 - 10.8 x10E3/uL 9.6  10.0  5.9   RBC 4.14 - 5.80 x10E6/uL 4.99  4.62  4.42   Hemoglobin 13.0 - 17.7 g/dL 15.0  13.3  13.0   HCT 37.5 - 51.0 % 44.5  40.6  40.0   Platelets 150 - 450 x10E3/uL 162  110  308.0   NEUT# 1.7 - 7.7 K/uL  8.6    Lymph# 0.7 - 4.0 K/uL  0.8       There is no height or weight on file to calculate BMI.  Orders:  No orders of the defined types were placed in this encounter.  No orders of the defined types were placed in this encounter.    Procedures: No procedures performed  Clinical Data: No additional findings.  ROS:  All other systems negative, except as noted in the HPI. Review of Systems  Objective: Vital Signs:  There were no vitals taken for this visit.  Specialty Comments:  No specialty comments available.  PMFS History: Patient Active Problem List   Diagnosis Date Noted   Near syncope 04/17/2017   TIA (transient ischemic attack) 04/16/2017   Arthritis of carpometacarpal [facility]) joint of left thumb 07/04/2016   COPD (chronic obstructive pulmonary disease) ([place]) 07/01/2016   Family history of first degree relative with dementia 03/30/2015   Neuropathy 03/30/2015   Memory loss 02/21/2015   Essential hypertension 07/19/2014   Hyperlipidemia 07/19/2014   Hypothyroidism 07/19/2014   Malnutrition of moderate degree ([place]) 07/19/2014   Unstable angina (HCC)    PAD (peripheral artery disease) ([place])    TOBACCO ABUSE 09/27/2008   CHOLESTEROL EMBOLIZATION SYNDROME 09/27/2008   PERIPHERAL VASCULAR DISEASE 09/27/2008    GERD 09/27/2008   DYSFUNCTION, EUSTACHIAN TUBE 10/09/2006   LOW HDL 07/28/2006   Depression, major, in partial remission ([place]) 07/28/2006   PULMONARY NODULE 07/28/2006   DIVERTICULOSIS, COLON 07/28/2006   ERECTILE DYSFUNCTION, ORGANIC 07/28/2006   SPRAIN/STRAIN, NECK 07/28/2006   COLONIC POLYPS, HX OF 07/28/2006   Past Medical History:  Diagnosis Date   Alcoholism in family    Anxiety    Arthritis    Depressive disorder, not elsewhere classified    Diverticulosis of colon (without mention of hemorrhage)    Dysfunction of eustachian tube    Exposure to STD    Family history of diabetes mellitus    Family history of psychiatric condition    family Hx of suicide    GERD (gastroesophageal reflux disease)    Impotence of organic origin    erectile dysfunction   Lipoprotein deficiencies    Other diseases of lung, not elsewhere classified    pulmonary nodule   Peripheral vascular disease, unspecified ([place])    lower extremity peripheral artery disease   Personal history of colonic polyps    Routine general medical examination at a health care facility    Seizures ([place])    Sprain of neck    Hx   Thyroid disease    Tobacco use disorder    Unspecified hypertensive kidney disease with chronic kidney disease stage I through stage IV, or unspecified(403.90)    cholesterol embolization syndrome    Family History  Problem Relation Age of Onset   Congestive Heart Failure Mother    CAD Father    Alzheimer's disease Sister    Lung cancer Brother    Alzheimer's disease Brother    Diabetes Brother    Colon cancer Neg Hx    Stomach cancer Neg Hx    Esophageal cancer Neg Hx    Colon polyps Neg Hx     Past Surgical History:  Procedure Laterality Date   ABDOMINAL AORTOGRAM W/LOWER EXTREMITY N/A 11/07/2021   Procedure: ABDOMINAL AORTOGRAM W/LOWER EXTREMITY;  Surgeon: Wellington Hampshire, MD;  Location: [facility];  Service: Cardiovascular;  Laterality: N/A;   angiography      bilateral lower extremity angiography, stenting of the left.    CARDIAC CATHETERIZATION N/A 07/19/2014   Procedure: Left Heart Cath and Coronary Angiography;  Surgeon: Leonie Man, MD;  Location: [facility] INVAS[facility] CV LAB CUPID;  Service: Cardiovascular;  Laterality: N/A;   CATARACT EXTRACTION Bilateral    EYE SURGERY  2016   blown macular   KNEE ARTHROSCOPY Left    x 2   LOWER EXTREMITY ANGIOGRAM N/A 11/04/2012   Procedure: LOWER EXTREMITY ANGIOGRAM;  Surgeon: Wellington Hampshire, MD;  Location: [facility] CATH LAB;  Service: Cardiovascular;  Laterality: N/A;   POPLITEAL ARTERY ANGIOPLASTY     didnt specify angioplasty or stent   SPERMATOCELECTOMY Left    testicular cyst   VASECTOMY     Social History   Occupational History   Occupation: retired  Tobacco Use   Smoking status: Former    Packs/day: 1.00    Years: 50.00    Total pack years: 50.00    Types: Cigarettes    Start date: 07/22/2016   Smokeless tobacco: Never   Tobacco comments:    has some off and on smoking - [facility]  Vaping Use   Vaping Use: Former  Substance and Sexual Activity   Alcohol use: Yes    Alcohol/week: 0.0 standard drinks of alcohol    Comment: social   Drug use: Yes    Types: Marijuana   Sexual activity: Not on file

## 2022-01-02 ENCOUNTER — Telehealth: Payer: Self-pay

## 2022-01-02 ENCOUNTER — Encounter: Payer: Self-pay | Admitting: Internal Medicine

## 2022-01-02 ENCOUNTER — Ambulatory Visit (AMBULATORY_SURGERY_CENTER): Payer: No Typology Code available for payment source | Admitting: Internal Medicine

## 2022-01-02 VITALS — BP 133/67 | HR 62 | Temp 97.3°F | Resp 17 | Ht 75.0 in | Wt 164.0 lb

## 2022-01-02 DIAGNOSIS — K219 Gastro-esophageal reflux disease without esophagitis: Secondary | ICD-10-CM | POA: Diagnosis not present

## 2022-01-02 DIAGNOSIS — R1310 Dysphagia, unspecified: Secondary | ICD-10-CM

## 2022-01-02 DIAGNOSIS — Z86010 Personal history of colonic polyps: Secondary | ICD-10-CM

## 2022-01-02 DIAGNOSIS — D123 Benign neoplasm of transverse colon: Secondary | ICD-10-CM

## 2022-01-02 DIAGNOSIS — K635 Polyp of colon: Secondary | ICD-10-CM | POA: Diagnosis not present

## 2022-01-02 DIAGNOSIS — R634 Abnormal weight loss: Secondary | ICD-10-CM | POA: Diagnosis not present

## 2022-01-02 MED ORDER — SODIUM CHLORIDE 0.9 % IV SOLN: 500.0000 mL | INTRAVENOUS | Status: AC

## 2022-01-02 NOTE — Telephone Encounter (Signed)
-----   Message from Irene Shipper, MD sent at 01/01/2022  4:55 PM EDT ----- Regarding: RE: Pulmonary appointment Excellent detective work! Thanks  ----- Message ----- From: Algernon Huxley, RN Sent: 01/01/2022   3:53 PM EDT To: Irene Shipper, MD Subject: RE: Pulmonary appointment                      9/13 Called pulmonary and they called pt on 9/13 and he declined to schedule, stated he wanted to have it approved through the [place] prior to scheduling. Pulmonary called him again on 9/20 and left him a message to call back. Called him again on 9/27 and patient still has not return the call and referral was closed.  ----- Message ----- From: Irene Shipper, MD Sent: 01/01/2022   3:41 PM EDT To: Algernon Huxley, RN Subject: Pulmonary appointment                          Did he ever get a pulmonary appointment after your Epic request?

## 2022-01-02 NOTE — Patient Instructions (Addendum)
RECOMMENDATIONS: - Repeat colonoscopy in 5-10 years for surveillance. - Patient has a contact number available for emergencies. The signs and symptoms of potential delayed complications were discussed with the patient. Return to normal activities tomorrow. Written discharge instructions were provided to the patient. - Resume previous diet. - Continue present medications. Resume Plavix today. - Await pathology results. 1. Patient has a contact number available for emergencies. The signs and symptoms of potential delayed complications were discussed with the patient. Return to normal activities tomorrow. Written discharge instructions were provided to the patient. 2. Resume previous diet. 3. Continue present medications. 4. Resume Plavix today 5. Return to the care of your primary provider. Follow through with pulmonary evaluation as previously recommended. GI follow-up as needed   YOU HAD AN ENDOSCOPIC PROCEDURE TODAY AT [facility]:   Refer to the procedure report that was given to you for any specific questions about what was found during the examination.  If the procedure report does not answer your questions, please call your gastroenterologist to clarify.  If you requested that your care partner not be given the details of your procedure findings, then the procedure report has been included in a sealed envelope for you to review at your convenience later.  YOU SHOULD EXPECT: Some feelings of bloating in the abdomen. Passage of more gas than usual.  Walking can help get rid of the air that was put into your GI tract during the procedure and reduce the bloating. If you had a lower endoscopy (such as a colonoscopy or flexible sigmoidoscopy) you may notice spotting of blood in your stool or on the toilet paper. If you underwent a bowel prep for your procedure, you may not have a normal bowel movement for a few days.  Please Note:  You might notice some irritation and congestion  in your nose or some drainage.  This is from the oxygen used during your procedure.  There is no need for concern and it should clear up in a day or so.  SYMPTOMS TO REPORT IMMEDIATELY:  Following lower endoscopy (colonoscopy or flexible sigmoidoscopy):  Excessive amounts of blood in the stool  Significant tenderness or worsening of abdominal pains  Swelling of the abdomen that is new, acute  Fever of 100F or higher  Following upper endoscopy (EGD)  Vomiting of blood or coffee ground material  New chest pain or pain under the shoulder blades  Painful or persistently difficult swallowing  New shortness of breath  Fever of 100F or higher  Black, tarry-looking stools  For urgent or emergent issues, a gastroenterologist can be reached at any hour by calling [phone]. Do not use MyChart messaging for urgent concerns.    DIET:  We do recommend a small meal at first, but then you may proceed to your regular diet.  Drink plenty of fluids but you should avoid alcoholic beverages for 24 hours.  MEDICATIONS: Continue present medications. Resume Plavix today at previous dose.  Please see handouts given to you by your recovery nurse.  Thank you for allowing [place] to provide for your healthcare needs today.  ACTIVITY:  You should plan to take it easy for the rest of today and you should NOT DRIVE or use heavy machinery until tomorrow (because of the sedation medicines used during the test).    FOLLOW UP: Our staff will call the number listed on your records the next business day following your procedure.  We will call around 7:15- 8:00 am to check  on you and address any questions or concerns that you may have regarding the information given to you following your procedure. If we do not reach you, we will leave a message.     If any biopsies were taken you will be contacted by phone or by letter within the next 1-3 weeks.  Please call us at [phone] if you have not heard about the  biopsies in 3 weeks.    SIGNATURES/CONFIDENTIALITY: You and/or your care partner have signed paperwork which will be entered into your electronic medical record.  These signatures attest to the fact that that the information above on your After Visit Summary has been reviewed and is understood.  Full responsibility of the confidentiality of this discharge information lies with you and/or your care-partner.

## 2022-01-02 NOTE — Op Note (Signed)
[facility] Patient Name: David David Procedure Date: 01/02/2022 9:32 AM MRN: [id number] Endoscopist: Docia Chuck. Henrene Pastor , MD Age: 71 Referring MD:  Date of Birth: February 18, 1951 Gender: Male Account #: [id number] Procedure:                Upper GI endoscopy Indications:              Dysphagia, Esophageal reflux, Weight loss Medicines:                Monitored Anesthesia Care Procedure:                Pre-Anesthesia Assessment:                           - Prior to the procedure, a History and Physical                            was performed, and patient medications and                            allergies were reviewed. The patient's tolerance of                            previous anesthesia was also reviewed. The risks                            and benefits of the procedure and the sedation                            options and risks were discussed with the patient.                            All questions were answered, and informed consent                            was obtained. Prior Anticoagulants: The patient has                            taken Plavix (clopidogrel), last dose was 5 days                            prior to procedure. ASA Grade Assessment: II - A                            patient with mild systemic disease. After reviewing                            the risks and benefits, the patient was deemed in                            satisfactory condition to undergo the procedure.                           After obtaining informed consent, the endoscope was  passed under direct vision. Throughout the                            procedure, the patient's blood pressure, pulse, and                            oxygen saturations were monitored continuously. The                            GIF [id number] #[id number] was introduced through the                            mouth, and advanced to the second part of duodenum.                            The  upper GI endoscopy was accomplished without                            difficulty. The patient tolerated the procedure                            well. Scope In: Scope Out: Findings:                 The esophagus was normal.                           The stomach was normal.                           The examined duodenum was normal.                           The cardia and gastric fundus were normal on                            retroflexion. Complications:            No immediate complications. Estimated Blood Loss:     Estimated blood loss: none. Impression:               1. Normal EGD                           2. No GI cause for weight loss found.                           3. Indeterminate pulmonary nodules on CT Recommendation:           1. Patient has a contact number available for                            emergencies. The signs and symptoms of potential                            delayed complications were discussed with the  patient. Return to normal activities tomorrow.                            Written discharge instructions were provided to the                            patient.                           2. Resume previous diet.                           3. Continue present medications.                           4. Resume Plavix today                           5. Return to the care of your primary provider.                            Follow through with pulmonary evaluation as                            previously recommended. GI follow-up as needed Docia Chuck. Henrene Pastor, MD 01/02/2022 10:20:25 AM This report has been signed electronically.

## 2022-01-02 NOTE — Op Note (Addendum)
[name] David Procedure Date: 01/02/2022 9:32 AM MRN: [id number] Endoscopist: Docia Chuck. Henrene Pastor , MD Age: 71 Referring MD:  Date of Birth: 1951-01-02 Gender: Male Account #: [id number] Procedure:                Colonoscopy with cold snare polypectomy x 1 Indications:              High risk colon cancer surveillance: Personal                            history of colonic polyps. Unexplained weight loss.                            Previous examinations 2006, 2015, 2018 Medicines:                Monitored Anesthesia Care Procedure:                Pre-Anesthesia Assessment:                           - Prior to the procedure, a History and Physical                            was performed, and patient medications and                            allergies were reviewed. The patient's tolerance of                            previous anesthesia was also reviewed. The risks                            and benefits of the procedure and the sedation                            options and risks were discussed with the patient.                            All questions were answered, and informed consent                            was obtained. Prior Anticoagulants: The patient has                            been on Plavix. Last taken 6 days prior to this                            procedure. ASA Grade Assessment: II - A patient                            with mild systemic disease. After reviewing the                            risks and benefits, the patient was deemed in  satisfactory condition to undergo the procedure.                           After obtaining informed consent, the colonoscope                            was passed under direct vision. Throughout the                            procedure, the patient's blood pressure, pulse, and                            oxygen saturations were monitored continuously. The                             CF [id number] #[id number] was introduced through the anus                            and advanced to the the cecum, identified by                            appendiceal orifice and ileocecal valve. The                            ileocecal valve, appendiceal orifice, and rectum                            were photographed. The quality of the bowel                            preparation was excellent. The colonoscopy was                            performed without difficulty. The patient tolerated                            the procedure well. The bowel preparation used was                            SUPREP via split dose instruction. Scope In: 9:45:45 AM Scope Out: 10:00:15 AM Scope Withdrawal Time: 0 hours 11 minutes 21 seconds  Total Procedure Duration: 0 hours 14 minutes 30 seconds  Findings:                 A 3 mm polyp was found in the transverse colon. The                            polyp was sessile. The polyp was removed with a                            cold snare. Resection and retrieval were complete.                           The exam was otherwise without  abnormality on                            direct and retroflexion views. Complications:            No immediate complications. Estimated blood loss:                            None. Estimated Blood Loss:     Estimated blood loss: none. Impression:               - One 3 mm polyp in the transverse colon, removed                            with a cold snare. Resected and retrieved.                           - The examination was otherwise normal on direct                            and retroflexion views. Recommendation:           - Repeat colonoscopy in 5-10 years for surveillance.                           - Patient has a contact number available for                            emergencies. The signs and symptoms of potential                            delayed complications were discussed with the                             patient. Return to normal activities tomorrow.                            Written discharge instructions were provided to the                            patient.                           - Resume previous diet.                           - Continue present medications. Resume Plavix today.                           - Await pathology results. Docia Chuck. Henrene Pastor, MD 01/02/2022 10:06:37 AM This report has been signed electronically.

## 2022-01-02 NOTE — Progress Notes (Signed)
HISTORY OF PRESENT ILLNESS:   David David is a 71 y.o. male with multiple significant medical problems as listed below including longstanding peripheral vascular disease for which she has been on aspirin and Plavix.  He was told to make this appointment by his oncologist and primary care physician at the [facility] regarding unexplained weight loss.  He tells me that the wait for GI services at the [place] is prohibitively long.  Patient is known to me in the past for periodic GI care.  I last saw him in the office September 2018 for reported history of polyps elsewhere, GERD without alarm features, and the need for surveillance colonoscopy.  See that dictation.  He did undergo surveillance colonoscopy February 10, 2017.  The examination was normal.  Follow-up in 10 years recommended.  He was taken off Plavix for short period of time prior to that exam.   Patient tells me that he has lost about 20 pounds.  He has had decreased appetite.  Looking at his weight from our office 5 years ago only shows a difference of 5 pounds.  He states he did have weight gain after that visit.  Does report intermittent solid food and pill dysphagia.  Nonprogressive.  No abdominal pain.  No vomiting.  No melena or hematochezia.  He tells me that his oncologist placed him on iron 3 times a week several years ago due to low iron levels.  He did have CBC and basic metabolic panel last week.  These were normal.  He denies having had any imaging.   REVIEW OF SYSTEMS:   All non-GI ROS negative except for       Past Medical History:  Diagnosis Date   Alcoholism in family     Anxiety     Arthritis     Depressive disorder, not elsewhere classified     Diverticulosis of colon (without mention of hemorrhage)     Dysfunction of eustachian tube     Exposure to STD     Family history of diabetes mellitus     Family history of psychiatric condition      family Hx of suicide    GERD (gastroesophageal reflux disease)      Impotence of organic origin      erectile dysfunction   Lipoprotein deficiencies     Other diseases of lung, not elsewhere classified      pulmonary nodule   Peripheral vascular disease, unspecified ([place])      lower extremity peripheral artery disease   Personal history of colonic polyps     Routine general medical examination at a health care facility     Seizures ([place])     Sprain of neck      Hx   Thyroid disease     Tobacco use disorder     Unspecified hypertensive kidney disease with chronic kidney disease stage I through stage IV, or unspecified(403.90)      cholesterol embolization syndrome           Past Surgical History:  Procedure Laterality Date   ABDOMINAL AORTOGRAM W/LOWER EXTREMITY N/A 11/07/2021    Procedure: ABDOMINAL AORTOGRAM W/LOWER EXTREMITY;  Surgeon: Wellington Hampshire, MD;  Location: [place] [facility];  Service: Cardiovascular;  Laterality: N/A;   angiography        bilateral lower extremity angiography, stenting of the left.    CARDIAC CATHETERIZATION N/A 07/19/2014    Procedure: Left Heart Cath and Coronary Angiography;  Surgeon: Leonie Man,  MD;  Location: [place] [facility]AS[facility] CUP[facility];  Service: Cardiovascular;  Laterality: N/A;   CATARACT EXTRACTION Bilateral     EYE SURGERY   2016    blown macular   KNEE ARTHROSCOPY Left      x 2   LOWER EXTREMITY ANGIOGRAM N/A 11/04/2012    Procedure: LOWER EXTREMITY ANGIOGRAM;  Surgeon: Wellington Hampshire, MD;  Location: [place] [facility];  Service: Cardiovascular;  Laterality: N/A;   POPLITEAL ARTERY ANGIOPLASTY        didnt specify angioplasty or stent   SPERMATOCELECTOMY Left      testicular cyst   VASECTOMY          Social History David David  reports that he has quit smoking. His smoking use included cigarettes. He started smoking about 5 years ago. He has a 50.00 pack-year smoking history. He has never used smokeless tobacco. He reports current alcohol use. He reports current drug use. Drug: Marijuana.   family  history includes Alzheimer's disease in his brother and sister; CAD in his father; Congestive Heart Failure in his mother; Diabetes in his brother; Lung cancer in his brother.        Allergies  Allergen Reactions   Bupropion Other (See Comments)      Unknown   Methylphenidate Other (See Comments)      Unknown   Tamsulosin Swelling   Tape Other (See Comments)      "Plastic" tape TEARS THE SKIN; please use paper tape or Coban wrap!!   Trazodone Other (See Comments)      To strong   Neomycin-Bacitracin Zn-Polymyx Rash   Niacin Rash   Paroxetine Other (See Comments)      Makes the patient feel SEVERELY SEDATED/LETHARGIC/CANNOT SPEAK WORDS Other reaction(s): Lethargy (intolerance) REACTION: goofy          PHYSICAL EXAMINATION: Vital signs: BP 124/68   Pulse 76   Ht 6\' 3"  (1.905 m)   Wt 164 lb 3.2 oz (74.5 kg)   BMI 20.52 kg/m   Constitutional: generally well-appearing, no acute distress Psychiatric: alert and oriented x3, cooperative Eyes: extraocular movements intact, anicteric, conjunctiva pink Mouth: oral pharynx moist, no lesions Neck: supple no lymphadenopathy Cardiovascular: heart regular rate and rhythm, no murmur Lungs: clear to auscultation bilaterally Abdomen: soft, nontender, nondistended, no obvious ascites, no peritoneal signs, normal bowel sounds, no organomegaly Rectal: Omitted Extremities: no clubbing, cyanosis, or lower extremity edema bilaterally.  Foot bandaged Skin: no lesions on visible extremities Neuro: No focal deficits.  Cranial nerves intact   ASSESSMENT:   1.  Unexplained weight loss. 2.  GERD with intermittent dysphagia.  Rule out peptic stricture.  Rule out neoplasm. 3.  History of colon polyps.  Last colonoscopy 5 years ago normal.  Rule out interval change 4.  Multiple significant medical problems including peripheral vascular disease on aspirin and Plavix     PLAN:   1.  Contrast CT of the abdomen and pelvis to evaluate unexplained  weight loss 2.  Schedule upper endoscopy with possible esophageal dilation to evaluate weight loss and dysphagia.  The patient is HIGH RISK given his comorbidities and the need to interrupt antiplatelet therapy.The nature of the procedure, as well as the risks, benefits, and alternatives were carefully and thoroughly reviewed with the patient. Ample time for discussion and questions allowed. The patient understood, was satisfied, and agreed to proceed.  3.  Schedule colonoscopy in patient with history of polyps to evaluate unexplained weight loss.  Rule out interval neoplasia.  Patient is high risk as above.The nature of the procedure, as well as the risks, benefits, and alternatives were carefully and thoroughly reviewed with the patient. Ample time for discussion and questions allowed. The patient understood, was satisfied, and agreed to proceed.  4.  Hold Plavix 5 days prior to the procedure.  Continue aspirin throughout 5.  Hold iron 1 week prior to the procedure 6.  Continue PPI 7.  Further recommendations after the above.  If no GI cause for weight loss found, that he should return to his PCP to explore non-GI causes for weight loss.  CT scan was obtained and was negative except for pulmonary nodules of uncertain clinical significance.  Pulmonary referral made.  Patient prefers to go through the [place] to have this evaluated.  Otherwise, no interval change in history or physical exam.

## 2022-01-02 NOTE — Progress Notes (Signed)
Called to room to assist during endoscopic procedure.  Patient ID and intended procedure confirmed with present staff. Received instructions for my participation in the procedure from the performing physician.  

## 2022-01-02 NOTE — Progress Notes (Signed)
Report to PACU, RN, vss, BBS= Clear.  

## 2022-01-03 ENCOUNTER — Telehealth: Payer: Self-pay

## 2022-01-03 NOTE — Telephone Encounter (Signed)
  Follow up Call-     01/02/2022    8:28 AM  Call back number  Post procedure Call Back phone  # [phone]  Permission to leave phone message Yes     Patient questions:  Do you have a fever, pain , or abdominal swelling? No. Pain Score  0 *  Have you tolerated food without any problems? Yes.    Have you been able to return to your normal activities? Yes.    Do you have any questions about your discharge instructions: Diet   No. Medications  No. Follow up visit  No.  Do you have questions or concerns about your Care? No.  Actions: * If pain score is 4 or above: No action needed, pain <4.

## 2022-01-04 ENCOUNTER — Encounter: Payer: Self-pay | Admitting: Internal Medicine

## 2022-02-04 ENCOUNTER — Ambulatory Visit: Payer: Medicare Other | Admitting: Orthopedic Surgery

## 2022-02-13 IMAGING — DX DG FOOT COMPLETE 3+V*L*
3 series · 3 of 3 positions shown · non-contrast
Comparison: None

CLINICAL DATA: Suspected foot cellulitis, fever

EXAM:
LEFT FOOT - COMPLETE 3+ VIEW

[foot ap]
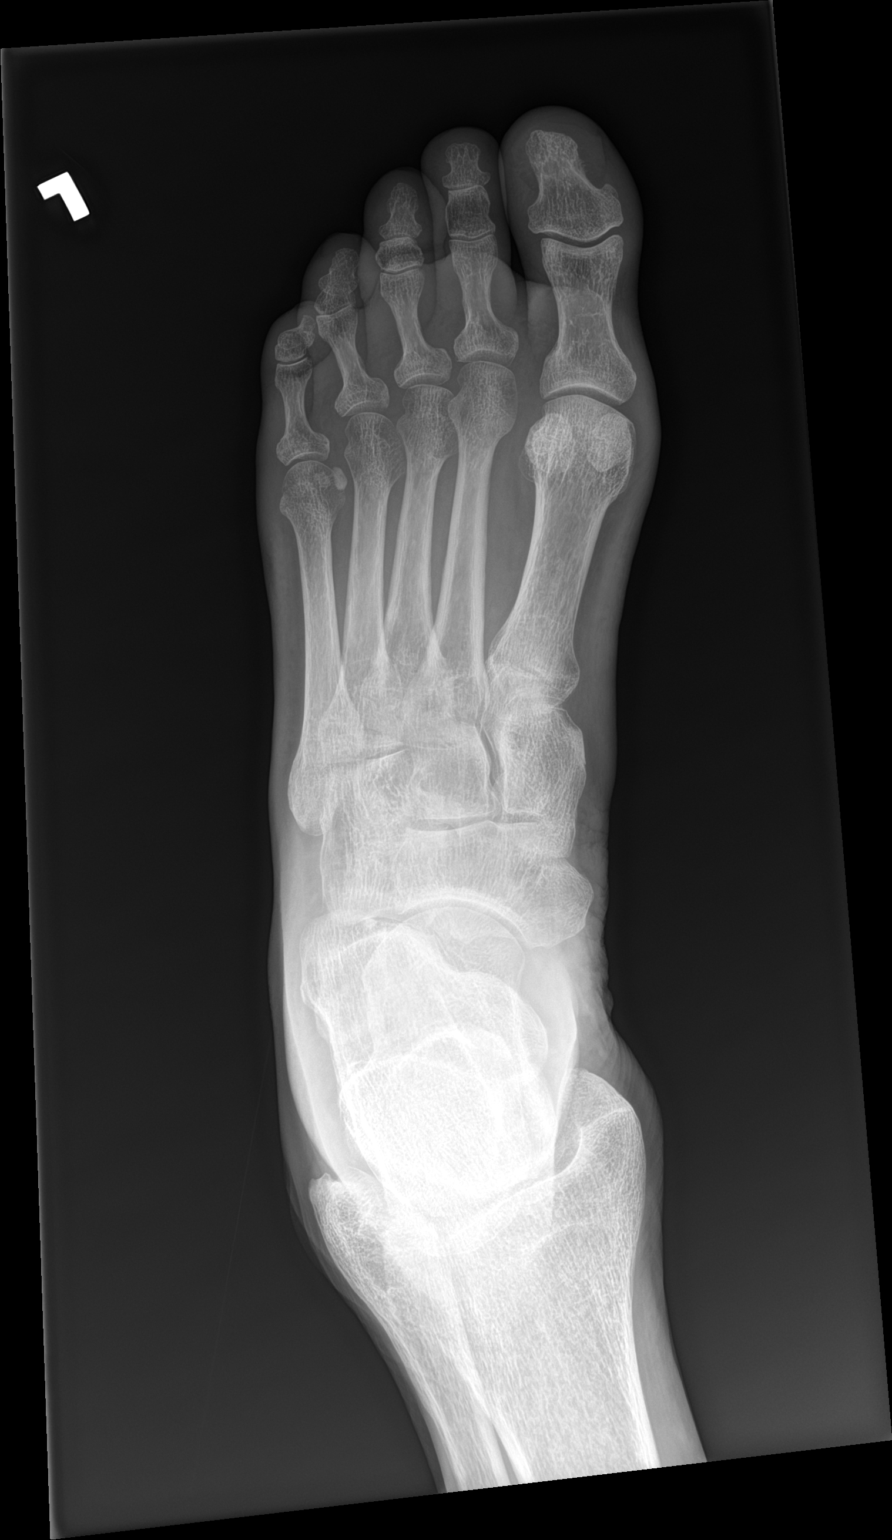

[foot obl]
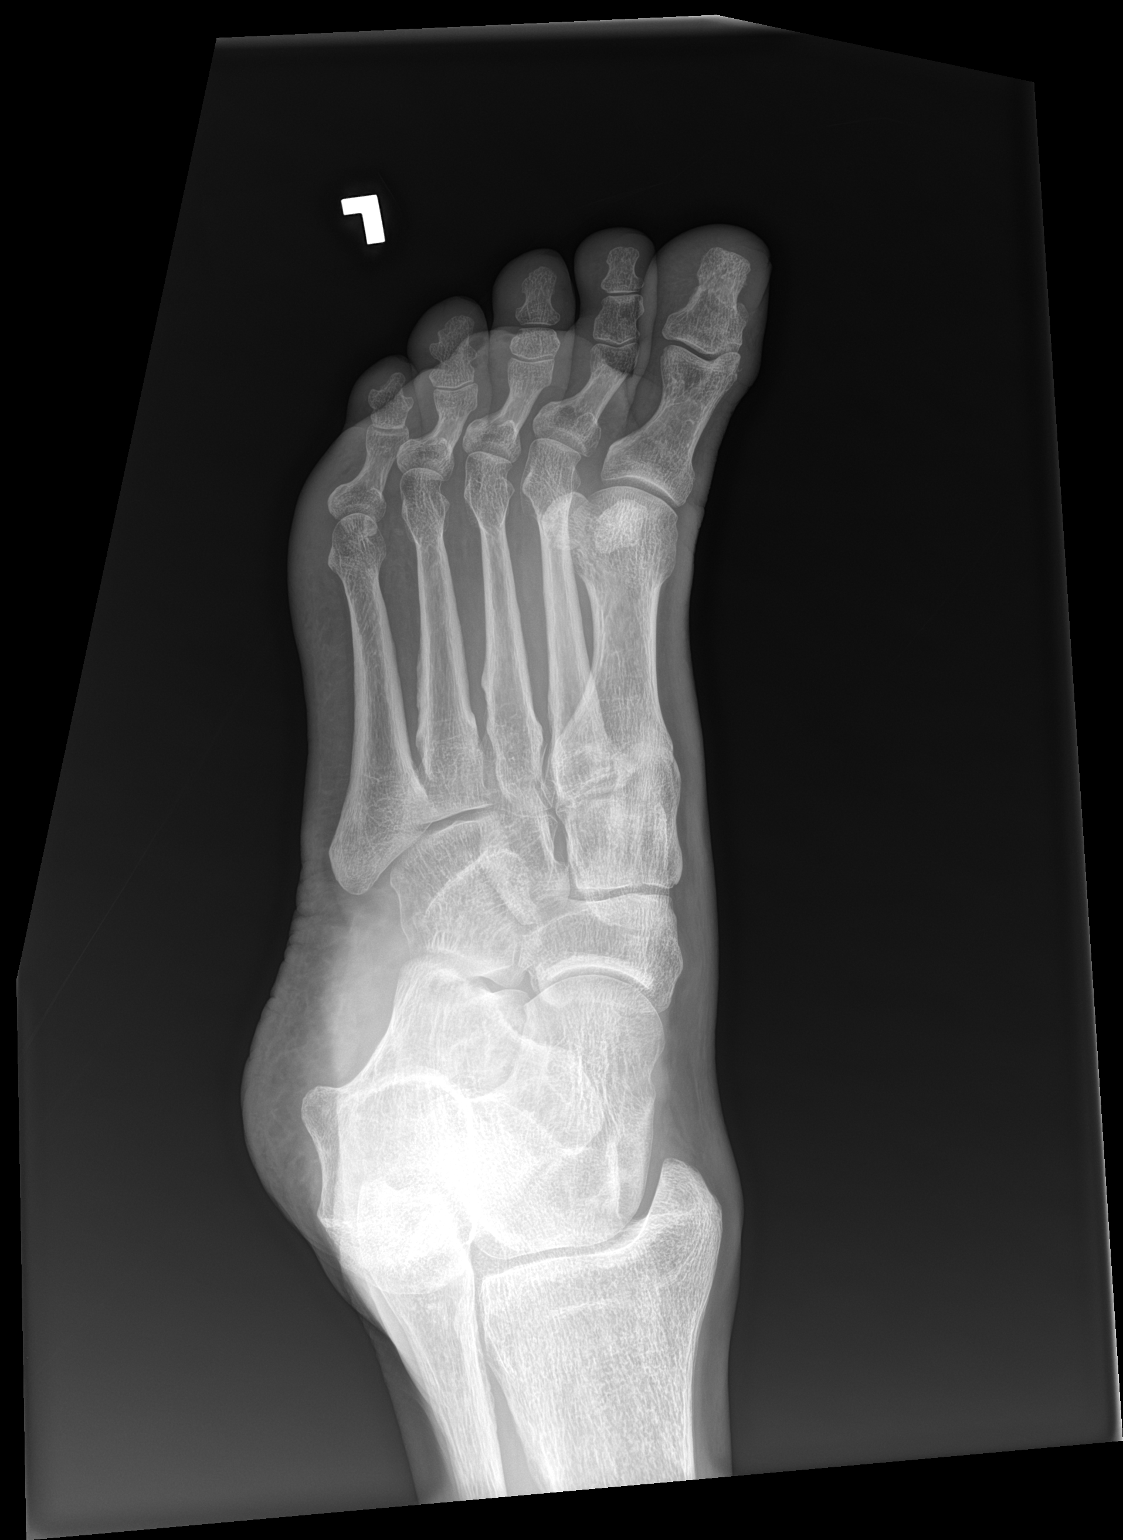

[foot lat]
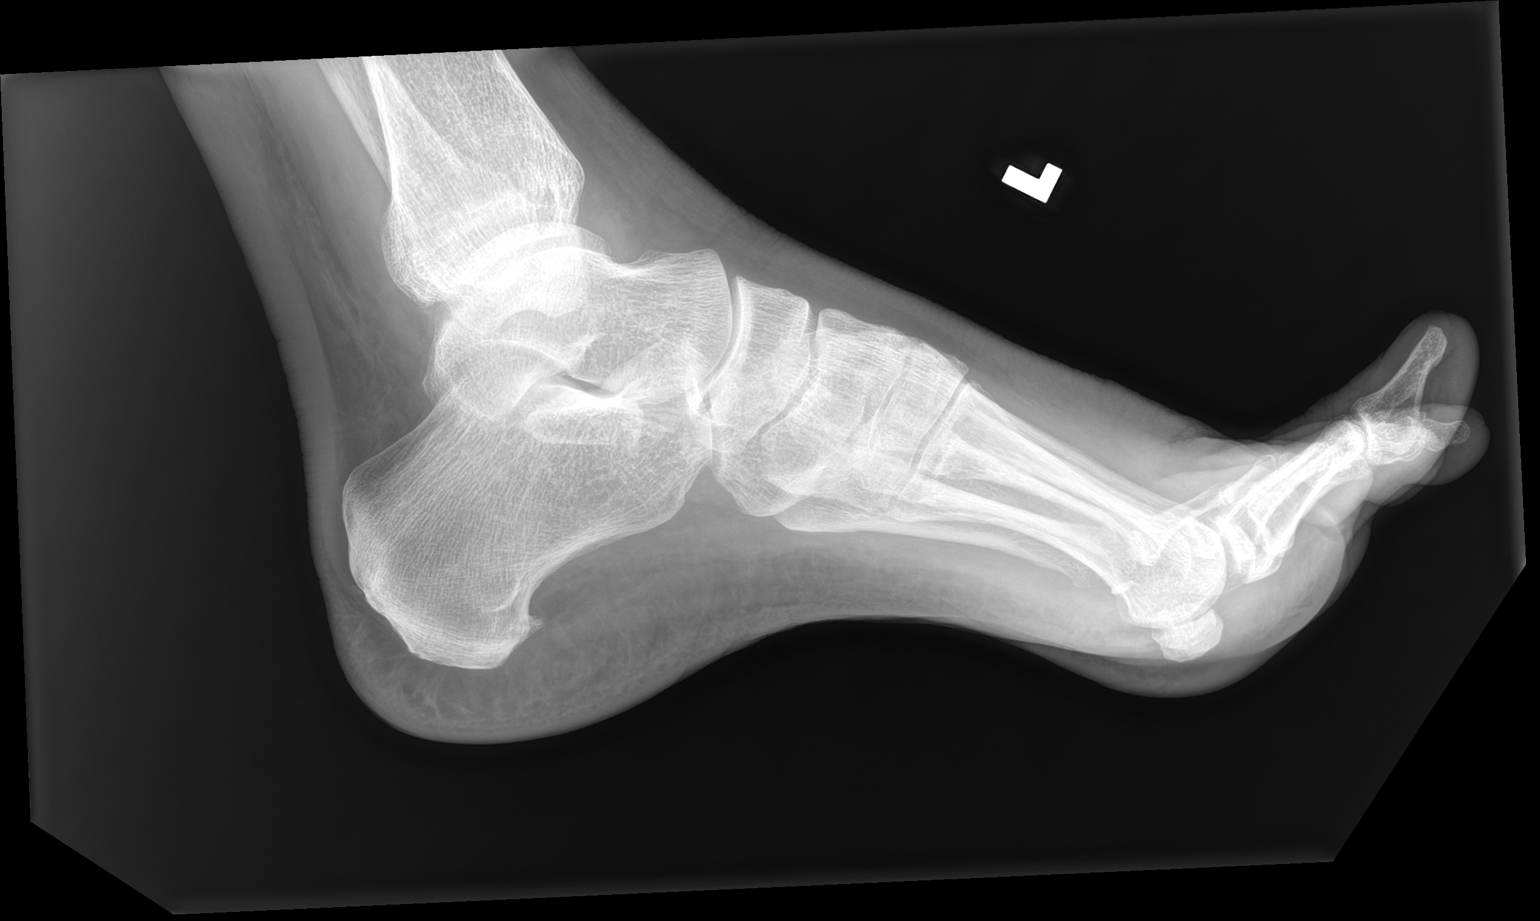

[3 of 3 positions shown; findings below may reference images not displayed]

FINDINGS: Diffuse soft tissue swelling of the forefoot is noted. No acute bony
abnormality. No abnormal erosive changes, periostitis or destructive
processes to suggest early features of osteomyelitis. Congenital
fusion of the fourth and fifth middle and distal phalanges, benign
anatomic variant. Plantar calcaneal spur is present. Small ankle
joint effusion is noted. Midfoot and hindfoot alignment is grossly
preserved though incompletely assessed on nonweightbearing films.
IMPRESSION: Diffuse soft tissue swelling of the forefoot. No acute bony
abnormality. No convincing radiographic features of osteomyelitis.

## 2022-02-18 DIAGNOSIS — L814 Other melanin hyperpigmentation: Secondary | ICD-10-CM | POA: Diagnosis not present

## 2022-02-18 DIAGNOSIS — D0339 Melanoma in situ of other parts of face: Secondary | ICD-10-CM | POA: Diagnosis not present

## 2022-02-18 DIAGNOSIS — L821 Other seborrheic keratosis: Secondary | ICD-10-CM | POA: Diagnosis not present

## 2022-02-18 DIAGNOSIS — L089 Local infection of the skin and subcutaneous tissue, unspecified: Secondary | ICD-10-CM | POA: Diagnosis not present

## 2022-02-18 DIAGNOSIS — L309 Dermatitis, unspecified: Secondary | ICD-10-CM | POA: Diagnosis not present

## 2022-02-18 DIAGNOSIS — D039 Melanoma in situ, unspecified: Secondary | ICD-10-CM | POA: Diagnosis not present

## 2022-03-13 NOTE — Progress Notes (Signed)
Synopsis: Referred for emphysema and multiple pulmonary nodules by Darlis Loan, MD  Subjective:   PATIENT ID: David David GENDER: male DOB: [birth year], MRN: [id number]  No chief complaint on file.  71yM with history of smoking, emphysema, multiple pulmonary nodules, GERD, PAD, CKD  Has had recent unintentional weight loss with workup revealing for multiple pulmonary nodules - dominant ~2cm RUL. ?Weakly pet avid.   Negative c-scope (3mm hyperplastic polyp), EGD  Otherwise pertinent review of systems is negative.  Past Medical History:  Diagnosis Date   Alcoholism in family    Anxiety    Arthritis    Depressive disorder, not elsewhere classified    Diverticulosis of colon (without mention of hemorrhage)    Dysfunction of eustachian tube    Exposure to STD    Family history of diabetes mellitus    Family history of psychiatric condition    family Hx of suicide    GERD (gastroesophageal reflux disease)    Impotence of organic origin    erectile dysfunction   Lipoprotein deficiencies    Other diseases of lung, not elsewhere classified    pulmonary nodule   Peripheral vascular disease, unspecified ([facility])    lower extremity peripheral artery disease   Personal history of colonic polyps    Routine general medical examination at a health care facility    Seizures ([facility])    Sprain of neck    Hx   Thyroid disease    Tobacco use disorder    Unspecified hypertensive kidney disease with chronic kidney disease stage I through stage IV, or unspecified(403.90)    cholesterol embolization syndrome     Family History  Problem Relation Age of Onset   Congestive Heart Failure Mother    CAD Father    Alzheimer's disease Sister    Lung cancer Brother    Alzheimer's disease Brother    Diabetes Brother    Colon cancer Neg Hx    Stomach cancer Neg Hx    Esophageal cancer Neg Hx    Colon polyps Neg Hx      Past Surgical History:  Procedure Laterality Date   ABDOMINAL  AORTOGRAM W/LOWER EXTREMITY N/A 11/07/2021   Procedure: ABDOMINAL AORTOGRAM W/LOWER EXTREMITY;  Surgeon: Iran Ouch, MD;  Location: [facility];  Service: Cardiovascular;  Laterality: N/A;   angiography     bilateral lower extremity angiography, stenting of the left.    CARDIAC CATHETERIZATION N/A 07/19/2014   Procedure: Left Heart Cath and Coronary Angiography;  Surgeon: Marykay Lex, MD;  Location: [facility];  Service: Cardiovascular;  Laterality: N/A;   CATARACT EXTRACTION Bilateral    EYE SURGERY  2016   blown macular   KNEE ARTHROSCOPY Left    x 2   LOWER EXTREMITY ANGIOGRAM N/A 11/04/2012   Procedure: LOWER EXTREMITY ANGIOGRAM;  Surgeon: Iran Ouch, MD;  Location: [facility];  Service: Cardiovascular;  Laterality: N/A;   POPLITEAL ARTERY ANGIOPLASTY     didnt specify angioplasty or stent   SPERMATOCELECTOMY Left    testicular cyst   VASECTOMY      Social History   Socioeconomic History   Marital status: Single    Spouse name: Not on file   Number of children: 1   Years of education: Not on file   Highest education level: Not on file  Occupational History   Occupation: retired  Tobacco Use   Smoking status: Former    Packs/day: 1.00  Years: 50.00    Total pack years: 50.00    Types: Cigarettes    Start date: 07/22/2016   Smokeless tobacco: Never   Tobacco comments:    has some off and on smoking - [facility]  Vaping Use   Vaping Use: Former  Substance and Sexual Activity   Alcohol use: Yes    Alcohol/week: 0.0 standard drinks of alcohol    Comment: social   Drug use: Yes    Types: Marijuana   Sexual activity: Not on file  Other Topics Concern   Not on file  Social History Narrative   No diet, not restricting cholesterol.   Social Determinants of Health   Financial Resource Strain: Not on file  Food Insecurity: Not on file  Transportation Needs: Not on file  Physical Activity: Not on file  Stress: Not on file   Social Connections: Not on file  Intimate Partner Violence: Not on file     Allergies  Allergen Reactions   Bupropion Other (See Comments)    Unknown   Methylphenidate Other (See Comments)    Unknown   Tamsulosin Swelling   Tape Other (See Comments)    "Plastic" tape TEARS THE SKIN; please use paper tape or Coban wrap!!   Trazodone Other (See Comments)    To strong   Neomycin-Bacitracin Zn-Polymyx Rash   Niacin Rash   Paroxetine Other (See Comments)    Makes the patient feel SEVERELY SEDATED/LETHARGIC/CANNOT SPEAK WORDS Other reaction(s): Lethargy (intolerance) REACTION: goofy     Outpatient Medications Prior to Visit  Medication Sig Dispense Refill   albuterol (PROVENTIL HFA;VENTOLIN HFA) 108 (90 Base) MCG/ACT inhaler Inhale 2 puffs into the lungs every 6 (six) hours as needed for wheezing or shortness of breath.     alfuzosin (UROXATRAL) 10 MG 24 hr tablet Take 10 mg by mouth at bedtime.  11   amLODipine (NORVASC) 10 MG tablet Take 5 mg by mouth daily.     ammonium lactate (LAC-HYDRIN) 12 % lotion Apply 1 application topically 2 (two) times daily as needed for dry skin.     aspirin EC 81 MG tablet Take 1 tablet (81 mg total) by mouth daily. 90 tablet 3   clopidogrel (PLAVIX) 75 MG tablet Take 1 tablet (75 mg total) by mouth daily. 30 tablet 3   escitalopram (LEXAPRO) 10 MG tablet Take 30 mg by mouth every morning.      ferrous sulfate 325 (65 FE) MG tablet Take 325 mg by mouth 3 (three) times a week. Take on Monday, Wednesday, Friday     gabapentin (NEURONTIN) 300 MG capsule Take 600 mg by mouth 2 (two) times daily.     Lactobacillus (ACIDOPHILUS) CAPS capsule Take 1 capsule by mouth daily.     mirtazapine (REMERON) 15 MG tablet Take 1 tablet (15 mg total) by mouth at bedtime. 30 tablet 2   Multiple Vitamin (MULTI-VITAMIN) tablet Take 1 tablet by mouth daily. theragran     nitroGLYCERIN (NITRODUR - DOSED IN MG/24 HR) 0.2 mg/hr patch Place 1 patch (0.2 mg total) onto the skin  daily. 30 patch 12   Omega-3 Fatty Acids (FISH OIL) 1000 MG CAPS Take 1,000 mg by mouth 2 (two) times daily.      omeprazole (PRILOSEC) 40 MG capsule Take 40 mg by mouth daily.     pravastatin (PRAVACHOL) 40 MG tablet Take 40 mg by mouth at bedtime.     tiZANidine (ZANAFLEX) 4 MG tablet Take 2 tablets by mouth  2 (two) times daily.     zolpidem (AMBIEN) 10 MG tablet Take 1 tablet by mouth at bedtime as needed.     Facility-Administered Medications Prior to Visit  Medication Dose Route Frequency Provider Last Rate Last Admin   0.9 %  sodium chloride infusion  500 mL Intravenous Continuous Hilarie Fredrickson, MD       sodium chloride flush (NS) 0.9 % injection 3 mL  3 mL Intravenous Q12H Iran Ouch, MD           Objective:   Physical Exam:  General appearance: 71 y.o., male, NAD, conversant  Eyes: anicteric sclerae; PERRL, tracking appropriately HENT: NCAT; MMM Neck: Trachea midline; no lymphadenopathy, no JVD Lungs: CTAB, no crackles, no wheeze, with normal respiratory effort CV: RRR, no murmur  Abdomen: Soft, non-tender; non-distended, BS present  Extremities: No peripheral edema, warm Skin: Normal turgor and texture; no rash Psych: Appropriate affect Neuro: Alert and oriented to person and place, no focal deficit     There were no vitals filed for this visit.   on *** LPM *** RA BMI Readings from Last 3 Encounters:  01/02/22 20.50 kg/m  12/11/21 20.27 kg/m  11/14/21 20.52 kg/m   Wt Readings from Last 3 Encounters:  01/02/22 164 lb (74.4 kg)  12/11/21 162 lb 3.2 oz (73.6 kg)  11/14/21 164 lb 3.2 oz (74.5 kg)     CBC    Component Value Date/Time   WBC 9.6 11/06/2021 0839   WBC 10.0 06/03/2019 2219   RBC 4.99 11/06/2021 0839   RBC 4.62 06/03/2019 2219   HGB 15.0 11/06/2021 0839   HCT 44.5 11/06/2021 0839   PLT 162 11/06/2021 0839   MCV 89 11/06/2021 0839   MCH 30.1 11/06/2021 0839   MCH 28.8 06/03/2019 2219   MCHC 33.7 11/06/2021 0839   MCHC 32.8  06/03/2019 2219   RDW 14.5 11/06/2021 0839   LYMPHSABS 0.8 06/03/2019 2219   MONOABS 0.5 06/03/2019 2219   EOSABS 0.0 06/03/2019 2219   BASOSABS 0.0 06/03/2019 2219      Chest Imaging:  02/04/22:       Pulmonary Functions Testing Results:     No data to display          FeNO: ***  Pathology: ***  Echocardiogram: ***  Heart Catheterization: ***    Assessment & Plan:    Plan:      Omar Person, MD [facility] 03/13/2022 12:51 PM

## 2022-03-13 NOTE — H&P (View-Only) (Signed)
Synopsis: Referred for emphysema and multiple pulmonary nodules by Beatris Si, MD  Subjective:   PATIENT ID: David David GENDER: male DOB: 05-07-1950, MRN: [id number]  Chief Complaint  Patient presents with   Pulmonary Consult    [facility] referral for multiple pulmonary nodules. He has had cough that has progressively been worsening over the past year- prod with clear sputum. He has lost approx 20 lbs over the past year.    71yM with history of smoking, emphysema, multiple pulmonary nodules, GERD, PAD, CKD  Has had recent unintentional weight loss with workup revealing for multiple pulmonary nodules - dominant ~2cm RUL Maybe 20 lb weight loss. ?Weakly pet avid.   Negative c-scope (62mm hyperplastic polyp), EGD  Cough worsening over the past year, clear sputum production. No hemoptysis. No significant DOE.  He has been prescribed spiriva but he's not using it.   He takes aspirin 81 mg daily, plavix 75 mg daily. He has a left popliteal stent which was placed 2010. He has had either angioplasty or thrombectomy RLE a couple months ago.   Otherwise pertinent review of systems is negative.  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 Was in [place]. He has 30+ py smoking history, still smoking 0.5ppd.     Past Medical History:  Diagnosis Date   Alcoholism in family    Anxiety    Arthritis    Depressive disorder, not elsewhere classified    Diverticulosis of colon (without mention of hemorrhage)    Dysfunction of eustachian tube    Exposure to STD    Family history of diabetes mellitus    Family history of psychiatric condition    family Hx of suicide    GERD (gastroesophageal reflux disease)    Impotence of organic origin    erectile dysfunction   Lipoprotein deficiencies    Other diseases of lung, not elsewhere classified    pulmonary nodule   Peripheral vascular  disease, unspecified ([place])    lower extremity peripheral artery disease   Personal history of colonic polyps    Routine general medical examination at a health care facility    Seizures ([place])    Sprain of neck    Hx   Thyroid disease    Tobacco use disorder    Unspecified hypertensive kidney disease with chronic kidney disease stage I through stage IV, or unspecified(403.90)    cholesterol embolization syndrome     Family History  Problem Relation Age of Onset   Congestive Heart Failure Mother    CAD Father    Alzheimer's disease Sister    Lung cancer Brother        smoking hx   Alzheimer's disease Brother    Diabetes Brother    Colon cancer Neg Hx    Stomach cancer Neg Hx    Esophageal cancer Neg Hx    Colon polyps Neg Hx      Past Surgical History:  Procedure Laterality Date   ABDOMINAL AORTOGRAM W/LOWER EXTREMITY N/A 11/07/2021   Procedure: ABDOMINAL AORTOGRAM W/LOWER EXTREMITY;  Surgeon: Wellington Hampshire, MD;  Location: [place] [facility];  Service: Cardiovascular;  Laterality: N/A;   angiography     bilateral lower extremity angiography, stenting of the left.    CARDIAC CATHETERIZATION N/A 07/19/2014   Procedure: Left Heart Cath and Coronary Angiography;  Surgeon: Leonie Man, MD;  Location: [facility] INVASIVE CV LAB CUPID;  Service: Cardiovascular;  Laterality: N/A;   CATARACT EXTRACTION Bilateral    EYE SURGERY  2016   blown macular   KNEE ARTHROSCOPY Left    x 2   LOWER EXTREMITY ANGIOGRAM N/A 11/04/2012   Procedure: LOWER EXTREMITY ANGIOGRAM;  Surgeon: Wellington Hampshire, MD;  Location: [facility];  Service: Cardiovascular;  Laterality: N/A;   POPLITEAL ARTERY ANGIOPLASTY     didnt specify angioplasty or stent   SPERMATOCELECTOMY Left    testicular cyst   VASECTOMY      Social History   Socioeconomic History   Marital status: Single    Spouse name: Not on file   Number of children: 1   Years of education: Not on file   Highest education level: Not on file   Occupational History   Occupation: retired  Tobacco Use   Smoking status: Every Day    Packs/day: 1.50    Years: 50.00    Total pack years: 75.00    Types: Cigarettes   Smokeless tobacco: Never  Vaping Use   Vaping Use: Former  Substance and Sexual Activity   Alcohol use: Yes    Alcohol/week: 0.0 standard drinks of alcohol    Comment: social   Drug use: Yes    Types: Marijuana   Sexual activity: Not on file  Other Topics Concern   Not on file  Social History Narrative   No diet, not restricting cholesterol.   Social Determinants of Health   Financial Resource Strain: Not on file  Food Insecurity: Not on file  Transportation Needs: Not on file  Physical Activity: Not on file  Stress: Not on file  Social Connections: Not on file  Intimate Partner Violence: Not on file     Allergies  Allergen Reactions   Bupropion Other (See Comments)    Unknown   Methylphenidate Other (See Comments)    Unknown   Tamsulosin Swelling   Tape Other (See Comments)    "Plastic" tape TEARS THE SKIN; please use paper tape or Coban wrap!!   Trazodone Other (See Comments)    To strong   Neomycin-Bacitracin Zn-Polymyx Rash   Niacin Rash   Paroxetine Other (See Comments)    Makes the patient feel SEVERELY SEDATED/LETHARGIC/CANNOT SPEAK WORDS Other reaction(s): Lethargy (intolerance) REACTION: goofy     Outpatient Medications Prior to Visit  Medication Sig Dispense Refill   albuterol (PROVENTIL HFA;VENTOLIN HFA) 108 (90 Base) MCG/ACT inhaler Inhale 2 puffs into the lungs every 6 (six) hours as needed for wheezing or shortness of breath.     alfuzosin (UROXATRAL) 10 MG 24 hr tablet Take 10 mg by mouth at bedtime.  11   amLODipine (NORVASC) 10 MG tablet Take 5 mg by mouth daily.     ammonium lactate (LAC-HYDRIN) 12 % lotion Apply 1 application topically 2 (two) times daily as needed for dry skin.     aspirin EC 81 MG tablet Take 1 tablet (81 mg total) by mouth daily. 90 tablet 3    clopidogrel (PLAVIX) 75 MG tablet Take 1 tablet (75 mg total) by mouth daily. 30 tablet 3   escitalopram (LEXAPRO) 10 MG tablet Take 30 mg by mouth every morning.      ferrous sulfate 325 (65 FE) MG tablet Take 325 mg by mouth 3 (three) times a week. Take on Monday, Wednesday, Friday     gabapentin (NEURONTIN) 300 MG capsule Take 600 mg by mouth 2 (two) times daily.     Lactobacillus (ACIDOPHILUS) CAPS capsule Take  1 capsule by mouth daily.     mirtazapine (REMERON) 15 MG tablet Take 1 tablet (15 mg total) by mouth at bedtime. 30 tablet 2   Multiple Vitamin (MULTI-VITAMIN) tablet Take 1 tablet by mouth daily. theragran     nitroGLYCERIN (NITRODUR - DOSED IN MG/24 HR) 0.2 mg/hr patch Place 1 patch (0.2 mg total) onto the skin daily. 30 patch 12   Omega-3 Fatty Acids (FISH OIL) 1000 MG CAPS Take 1,000 mg by mouth 2 (two) times daily.      omeprazole (PRILOSEC) 40 MG capsule Take 40 mg by mouth daily.     pravastatin (PRAVACHOL) 40 MG tablet Take 40 mg by mouth at bedtime.     zolpidem (AMBIEN) 10 MG tablet Take 1 tablet by mouth at bedtime as needed.     tiZANidine (ZANAFLEX) 4 MG tablet Take 2 tablets by mouth 2 (two) times daily.     Facility-Administered Medications Prior to Visit  Medication Dose Route Frequency Provider Last Rate Last Admin   0.9 %  sodium chloride infusion  500 mL Intravenous Continuous Irene Shipper, MD       sodium chloride flush (NS) 0.9 % injection 3 mL  3 mL Intravenous Q12H Wellington Hampshire, MD           Objective:   Physical Exam:  General appearance: 71 y.o., male, NAD, conversant  Eyes: anicteric sclerae; PERRL, tracking appropriately HENT: NCAT; MMM Neck: Trachea midline; no lymphadenopathy, no JVD Lungs: CTAB, no crackles, no wheeze, with normal respiratory effort CV: RRR, no murmur  Abdomen: Soft, non-tender; non-distended, BS present  Extremities: No peripheral edema, warm Skin: Normal turgor and texture; no rash Psych: Appropriate affect Neuro:  Alert and oriented to person and place, no focal deficit     Vitals:   03/14/22 0827  BP: 132/62  Pulse: 65  Temp: 98.2 F (36.8 C)  TempSrc: Oral  SpO2: 95%  Weight: 167 lb (75.8 kg)  Height: 6\' 3"  (1.905 m)   95% on RA BMI Readings from Last 3 Encounters:  03/14/22 20.87 kg/m  01/02/22 20.50 kg/m  12/11/21 20.27 kg/m   Wt Readings from Last 3 Encounters:  03/14/22 167 lb (75.8 kg)  01/02/22 164 lb (74.4 kg)  12/11/21 162 lb 3.2 oz (73.6 kg)     CBC    Component Value Date/Time   WBC 9.6 11/06/2021 0839   WBC 10.0 06/03/2019 2219   RBC 4.99 11/06/2021 0839   RBC 4.62 06/03/2019 2219   HGB 15.0 11/06/2021 0839   HCT 44.5 11/06/2021 0839   PLT 162 11/06/2021 0839   MCV 89 11/06/2021 0839   MCH 30.1 11/06/2021 0839   MCH 28.8 06/03/2019 2219   MCHC 33.7 11/06/2021 0839   MCHC 32.8 06/03/2019 2219   RDW 14.5 11/06/2021 0839   LYMPHSABS 0.8 06/03/2019 2219   MONOABS 0.5 06/03/2019 2219   EOSABS 0.0 06/03/2019 2219   BASOSABS 0.0 06/03/2019 2219      Chest Imaging:  02/04/22:       Pulmonary Functions Testing Results:     No data to display         Last PFT 2018: FEV1/FVC 58%, FEV1 101%, DLCO 70% pred    Assessment & Plan:   # Pulmonary nodules Mildly hypermetabolic (SUV near blood pool) RUL nodule, lingula nodule   # Smoking  # COPD gold group B # Emphysema  Plan: - CT Super D for bronch planning - navigation bronch on 03/21/21 - discussed  risks including respiratory failure <5%, 1% chance PTX requiring chest tube, 03/998 chance major bleeding. Agrees to proceed. Needs to hold aspirin and plavix for 5 days before procedure. - restart spiriva 2 puffs daily - PFTs at [facility] next available - revisit smoking cessation next appointment     Maryjane Hurter, MD [facility] 03/14/2022 8:38 AM

## 2022-03-14 ENCOUNTER — Ambulatory Visit (INDEPENDENT_AMBULATORY_CARE_PROVIDER_SITE_OTHER): Payer: No Typology Code available for payment source | Admitting: Student

## 2022-03-14 ENCOUNTER — Telehealth: Payer: Self-pay | Admitting: Student

## 2022-03-14 ENCOUNTER — Encounter: Payer: Self-pay | Admitting: Student

## 2022-03-14 VITALS — BP 132/62 | HR 65 | Temp 98.2°F | Ht 75.0 in | Wt 167.0 lb

## 2022-03-14 DIAGNOSIS — R918 Other nonspecific abnormal finding of lung field: Secondary | ICD-10-CM

## 2022-03-14 DIAGNOSIS — J449 Chronic obstructive pulmonary disease, unspecified: Secondary | ICD-10-CM | POA: Diagnosis not present

## 2022-03-14 MED ORDER — SPIRIVA RESPIMAT 2.5 MCG/ACT IN AERS: 2.0000 | INHALATION_SPRAY | Freq: Every day | RESPIRATORY_TRACT | 0 refills | Status: DC

## 2022-03-14 NOTE — Patient Instructions (Addendum)
-   We will schedule robotic bronchoscopy hopefully on 1/4, CT Chest before that or on same day - Need to be off of aspirin and plavix for 5 days before the procedure - nothing to eat or drink after midnight the night before the procedure

## 2022-03-20 ENCOUNTER — Encounter (HOSPITAL_COMMUNITY): Payer: Self-pay | Admitting: Student

## 2022-03-20 NOTE — Progress Notes (Signed)
Anesthesia Chart Review: David David  Case: [id number] Date/Time: 03/21/22 0800   Procedure: ROBOTIC ASSISTED NAVIGATIONAL BRONCHOSCOPY   Anesthesia type: General   Pre-op diagnosis: BILATERAL PULMONARY NODULE   Location: [facility]   Surgeons: Maryjane Hurter, MD       DISCUSSION: Patient is a 72 year old male scheduled for the above procedure.  He was found to have multiple lung nodules on CT abd/pelvis done for weight loss evaluation.   History includes smoking, CKD, PVD/atheroembolic disease (DES to left TP trunk 2008, PTA for ISR 2011 & 2014; subacute thrombotic occlusion right ATA 11/07/21, medical therapy), GERD, polymyalgia rheumatica, seizures, thyroid disease/hypothyroidism (per 02/05/22 [facility] PCP note, "last TSH normal in 05/2021. It does not appear he has filled levothyroxine since 2019"), lung nodules. [facility] notes also indicate history of pre-diabetes with A1c 6.2% in 07/2021. 3 mm hyperplastic polyp resected from transverse colon and normal EGD 01/02/22.   He had [facility] CT surgery consult on 02/28/22 with Dr. Tomie China. He noted, "mildly hypermetabolic left upper lobe nodule and large but not hypermetabolic right upper lobe nodule. Patient needs to see a pulmonologist for PFT and an attempt at biopsy. If biopsy cannot be obtained will need to consider empiric radiation to the nodules versus surgical wedge resection with possible lobectomy if pulmonary function demonstrates ability to tolerate surgery.  - Referral to community care pulmonary (patient knows a pulmonologist he wants to work with) Please obtain biopsy, PFT and then refer to rad/onc vs thoracic surgery".  Last cardiology follow-up with Dr. Fletcher Anon was on 12/11/21. He primarily follows for PAD but notes known moderate LCX disease (not amendable to PCI) from 07/2014 LHC. He uses a Nitro patch to his leg for blood flow/vasodilation effects as needed. Six month follow-up planned.    Per Dr.  Verlee Monte, he needs to hold aspirin and Plavix for 5 days before procedure. He is a same day work-up, so further evaluation on the day of surgery.    VS:  BP Readings from Last 3 Encounters:  03/14/22 132/62  01/02/22 133/67  12/11/21 (!) 146/84   Pulse Readings from Last 3 Encounters:  03/14/22 65  01/02/22 62  12/11/21 (!) 57     PROVIDERS: [name], [name], MD is PCP. [facility] PCP is Lorelei Pont, MD, last visit noted 02/05/22.  Kathlyn Sacramento, MD is cardiologist Leslye Peer, MD is pulmonologist Reva Bores, MD is HEM-ONC. Last visit seen is from 01/16/1622 for MGUS ("CRAB criteria were negative ;M spike  normal;light chains ratio 1.39"), pulmonary nodules, IDA, and right thigh SCC (s/p resection in 2017). He was referred to CT surgery for pulmonary nodules.   LABS: For day of surgery as indicated. Last results in [facility] include:  Lab Results  Component Value Date   WBC 9.6 11/06/2021   HGB 15.0 11/06/2021   HCT 44.5 11/06/2021   PLT 162 11/06/2021   GLUCOSE 93 11/06/2021   NA 142 11/06/2021   K 5.1 11/06/2021   CL 103 11/06/2021   CREATININE 0.99 11/06/2021   BUN 11 11/06/2021   CO2 25 11/06/2021    IMAGES: PET Scan 02/06/22 ([facility]): Impression: 1. Hypermetabolic activity within a 1.2 cm x 7 mm enlarging  peripheral lingular nodule and mild hypermetabolic activity in  the known enlarging peripheral right upper lobe mass, suspicious  for bronchogenic carcinomas until otherwise proven. No worrisome  hypermetabolic mediastinal or hilar lymph nodes. No definite  evidence for metastatic disease in the neck,  abdomen, or pelvis.  2. Hypermetabolic activity is seen in the region of lucency about  the root of the left lateral maxillary incisor tooth. Periapical  abscess may have this appearance. Dental evaluation is  recommended.   CT Chest 02/04/22 [facility]): Impression: 1. Emphysema with increase in the size of an irregular lesion in  the subpleural right upper  lobe, currently measuring 3.1 x 2.2 x  2.3 cm, viewed with suspicion for malignancy.  2. A subpleural nodule in the left upper lobe measuring 11 mm in  long axis is not significantly changed from the prior  examination. There are additional nodular densities also appear  similar in size as described above, not significantly changed.  3. New area of thickening along the margin of the cystic  structure in the right upper lobe that warrants attention on  follow-up.  4. New area of airspace opacity in the left lower lobe appears  most consistent with an infectious or inflammatory process.  Clinical correlation is advised with attention on follow-up.   CT Abd/pelvis 11/26/21: IMPRESSION: 1. No acute abnormality identified in the abdomen or pelvis. 2. Calcifications layering in the urinary bladder measure up to 5 mm. 3. Mild enlargement of the prostate gland. 4. Multiple pulmonary nodules. Most significant: 6 mm right solid pulmonary nodule. Per Fleischner Society Guidelines, recommend a non-contrast Chest CT at 3-6 months. If patient is high risk for malignancy, recommend an additional non-contrast Chest CT at 18-24 months; if patient is low risk for malignancy a non-contrast Chest CT at 18-24 months is optional. These guidelines do not apply to immunocompromised patients and patients with cancer. Follow up in patients with significant comorbidities as clinically warranted. For lung cancer screening, adhere to Lung-RADS guidelines. Reference: Radiology. 2017; 284(1):228-43. 5. Pulmonary scarring with possible honeycombing in the dependent bilateral lower lobes, consider further evaluation by dedicated high-resolution chest CT with inspiratory and expiratory imaging. 6.  Aortic Atherosclerosis (ICD10-I70.0).    CTA Head/Neck 04/16/17: IMPRESSION: Ordinary age related atherosclerotic changes. No carotid stenosis. No vertebral stenosis. No intracranial large or medium vessel occlusion  or correctable proximal stenosis. No aneurysm.   EKG: 11/06/21: SB at 59 bpm   CV: Abdominal Aortogram with LE Runoff 11/07/21 Kathlyn Sacramento, MD) Conclusion: 1.  No significant aortoiliac disease other than an occluded left internal iliac artery 2.  Right lower extremity: No significant SFA, profunda or popliteal disease.  Occluded anterior tibial artery from its origin all the way distally.  Likely occluded distal peroneal artery.  The posterior tibial artery is large and dominant and supplies the pedal arch and the dorsalis pedis territory. Recommendations: Suspect subacute thrombotic occlusion of the anterior tibial artery rather than embolism.  There is currently inline flow via the large dominant posterior tibial artery and thus I do not recommend revascularization.  Continue dual antiplatelet therapy and strongly advised him to quit smoking.    Echo 04/17/17: Study Conclusions  - Left ventricle: The cavity size was normal. Systolic function was    normal. The estimated ejection fraction was in the range of 55%    to 60%. Wall motion was normal; there were no regional wall    motion abnormalities. Left ventricular diastolic function    parameters were normal.  - Mitral valve: Valve area by pressure half-time: 2.04 cm^2.  - Atrial septum: A patent foramen ovale cannot be excluded.    Cardiac cath 07/19/14: Angiographically moderate single-vessel CAD: Ost 1st Mrg to 1st Mrg lesion, 60% stenosed. Normal LV size and  function with normal EDP Consider non-anginal cause for chest pain versus hypertension mediated diastolic dysfunction with moderate circumflex OM disease.   Patient admitted for signs symptoms concerning for unstable angina. No culprit lesion was seen besides a 60% OM lesion. This is unlikely the mediator of his resting symptoms. Also not to PTCA/PCI amenable - unless with cutting balloon which is unwise in a roughly 2 mm vessel.   Plan: Standard post radial cath care  with TR band removal Continue aggressive risk factor modification, consider increasing amlodipine dose for possible spasm an antianginal effect. Continue smoking cessation counseling. He states that he last smoked the day of admission. Hopefully this will be a trigger for him to quit smoking.  Past Medical History:  Diagnosis Date   Alcoholism in family    Anxiety    Arthritis    Depressive disorder, not elsewhere classified    Diverticulosis of colon (without mention of hemorrhage)    Dysfunction of eustachian tube    Exposure to STD    Family history of diabetes mellitus    Family history of psychiatric condition    family Hx of suicide    GERD (gastroesophageal reflux disease)    Impotence of organic origin    erectile dysfunction   Lipoprotein deficiencies    Other diseases of lung, not elsewhere classified    pulmonary nodule   Peripheral vascular disease, unspecified ([place])    lower extremity peripheral artery disease   Personal history of colonic polyps    Routine general medical examination at a health care facility    Seizures ([place])    Sprain of neck    Hx   Thyroid disease    Tobacco use disorder    Unspecified hypertensive kidney disease with chronic kidney disease stage I through stage IV, or unspecified(403.90)    cholesterol embolization syndrome    Past Surgical History:  Procedure Laterality Date   ABDOMINAL AORTOGRAM W/LOWER EXTREMITY N/A 11/07/2021   Procedure: ABDOMINAL AORTOGRAM W/LOWER EXTREMITY;  Surgeon: Wellington Hampshire, MD;  Location: [place] [facility];  Service: Cardiovascular;  Laterality: N/A;   angiography     bilateral lower extremity angiography, stenting of the left.    CARDIAC CATHETERIZATION N/A 07/19/2014   Procedure: Left Heart Cath and Coronary Angiography;  Surgeon: Leonie Man, MD;  Location: [facility] INVASIVE CV LAB CUPID;  Service: Cardiovascular;  Laterality: N/A;   CATARACT EXTRACTION Bilateral    EYE SURGERY  2016   blown macular    KNEE ARTHROSCOPY Left    x 2   LOWER EXTREMITY ANGIOGRAM N/A 11/04/2012   Procedure: LOWER EXTREMITY ANGIOGRAM;  Surgeon: Wellington Hampshire, MD;  Location: [place] [facility];  Service: Cardiovascular;  Laterality: N/A;   POPLITEAL ARTERY ANGIOPLASTY     didnt specify angioplasty or stent   SPERMATOCELECTOMY Left    testicular cyst   VASECTOMY      MEDICATIONS:  0.9 %  sodium chloride infusion   sodium chloride flush (NS) 0.9 % injection 3 mL    albuterol (PROVENTIL HFA;VENTOLIN HFA) 108 (90 Base) MCG/ACT inhaler   alfuzosin (UROXATRAL) 10 MG 24 hr tablet   amLODipine (NORVASC) 10 MG tablet   ammonium lactate (LAC-HYDRIN) 12 % lotion   aspirin EC 81 MG tablet   clopidogrel (PLAVIX) 75 MG tablet   escitalopram (LEXAPRO) 10 MG tablet   ferrous sulfate 325 (65 FE) MG tablet   gabapentin (NEURONTIN) 300 MG capsule   Lactobacillus (ACIDOPHILUS) CAPS capsule   mirtazapine (REMERON)  15 MG tablet   Multiple Vitamin (MULTI-VITAMIN) tablet   nitroGLYCERIN (NITRODUR - DOSED IN MG/24 HR) 0.2 mg/hr patch   Omega-3 Fatty Acids (FISH OIL) 1000 MG CAPS   omeprazole (PRILOSEC) 40 MG capsule   pravastatin (PRAVACHOL) 40 MG tablet   Tiotropium Bromide Monohydrate (SPIRIVA RESPIMAT) 2.5 MCG/ACT AERS   zolpidem (AMBIEN) 10 MG tablet    Myra Gianotti, PA-C Surgical Short Stay/Anesthesiology [facility] Phone [phone] [facility] Phone [phone] 03/20/2022 2:32 PM

## 2022-03-20 NOTE — Anesthesia Preprocedure Evaluation (Signed)
Anesthesia Evaluation  Patient identified by MRN, date of birth, ID band Patient awake    Reviewed: Allergy & Precautions, NPO status , Patient's Chart, lab work & pertinent test results  Airway Mallampati: II  TM Distance: >3 FB Neck ROM: Full    Dental  (+) Dental Advisory Given   Pulmonary COPD, Current Smoker and Patient abstained from smoking.   breath sounds clear to auscultation       Cardiovascular hypertension, Pt. on medications + Peripheral Vascular Disease   Rhythm:Regular Rate:Normal     Neuro/Psych Seizures -,  TIA   GI/Hepatic Neg liver ROS,GERD  ,,  Endo/Other  Hypothyroidism    Renal/GU negative Renal ROS     Musculoskeletal  (+) Arthritis ,    Abdominal   Peds  Hematology negative hematology ROS (+)   Anesthesia Other Findings   Reproductive/Obstetrics                              Anesthesia Physical Anesthesia Plan  ASA: 3  Anesthesia Plan: General   Post-op Pain Management: Tylenol PO (pre-op)* and Minimal or no pain anticipated   Induction: Intravenous  PONV Risk Score and Plan: 1 and Dexamethasone, Ondansetron and Treatment may vary due to age or medical condition  Airway Management Planned: Oral ETT  Additional Equipment:   Intra-op Plan:   Post-operative Plan: Extubation in OR  Informed Consent: I have reviewed the patients History and Physical, chart, labs and discussed the procedure including the risks, benefits and alternatives for the proposed anesthesia with the patient or authorized representative who has indicated his/her understanding and acceptance.     Dental advisory given  Plan Discussed with: CRNA  Anesthesia Plan Comments: (  )        Anesthesia Quick Evaluation

## 2022-03-20 NOTE — Progress Notes (Signed)
PCP - [facility]  Cardiologist - Dr Kathlyn Sacramento  Chest x-ray - n/a EKG - 11/06/21 Stress Test - n/a ECHO - 04/17/17 Cardiac Cath - 07/19/14  ICD Pacemaker/Loop - n/a  Sleep Study -  n/a CPAP - none  ASA and Plavix Instructions:  Hold these meds 5 days prior to surgery.  Last dose was on 03/14/22.  Anesthesia review: Yes  STOP now taking any Aspirin (unless otherwise instructed by your surgeon), Aleve, Naproxen, Ibuprofen, Motrin, Advil, Goody's, BC's, all herbal medications, fish oil, and all vitamins.   Coronavirus Screening Covid test on DOS Do you have any of the following symptoms:  Cough yes/no: No Fever (>100.20F)  yes/no: No Runny nose yes/no: No Sore throat yes/no: No Difficulty breathing/shortness of breath  yes/no: No  Have you traveled in the last 14 days and where? yes/no: No  Patient verbalized understanding of instructions that were given via phone.

## 2022-03-21 ENCOUNTER — Ambulatory Visit (HOSPITAL_COMMUNITY): Payer: No Typology Code available for payment source

## 2022-03-21 ENCOUNTER — Ambulatory Visit (HOSPITAL_COMMUNITY): Payer: No Typology Code available for payment source | Admitting: Vascular Surgery

## 2022-03-21 ENCOUNTER — Other Ambulatory Visit: Payer: Self-pay

## 2022-03-21 ENCOUNTER — Encounter (HOSPITAL_COMMUNITY)
Admission: RE | Disposition: A | Discharge status: Home / Self Care | Payer: Self-pay | Source: Home / Self Care | Attending: Student

## 2022-03-21 ENCOUNTER — Ambulatory Visit (HOSPITAL_COMMUNITY)
Admission: RE | Admit: 2022-03-21 | Discharge: 2022-03-21 | Disposition: A | Discharge status: Home / Self Care | Payer: No Typology Code available for payment source | Attending: Student | Admitting: Student

## 2022-03-21 ENCOUNTER — Ambulatory Visit (HOSPITAL_BASED_OUTPATIENT_CLINIC_OR_DEPARTMENT_OTHER): Payer: No Typology Code available for payment source | Admitting: Vascular Surgery

## 2022-03-21 DIAGNOSIS — C3411 Malignant neoplasm of upper lobe, right bronchus or lung: Secondary | ICD-10-CM | POA: Diagnosis not present

## 2022-03-21 DIAGNOSIS — C3412 Malignant neoplasm of upper lobe, left bronchus or lung: Secondary | ICD-10-CM | POA: Diagnosis not present

## 2022-03-21 DIAGNOSIS — Z8673 Personal history of transient ischemic attack (TIA), and cerebral infarction without residual deficits: Secondary | ICD-10-CM | POA: Insufficient documentation

## 2022-03-21 DIAGNOSIS — K219 Gastro-esophageal reflux disease without esophagitis: Secondary | ICD-10-CM | POA: Diagnosis not present

## 2022-03-21 DIAGNOSIS — J432 Centrilobular emphysema: Secondary | ICD-10-CM | POA: Diagnosis not present

## 2022-03-21 DIAGNOSIS — R569 Unspecified convulsions: Secondary | ICD-10-CM | POA: Diagnosis not present

## 2022-03-21 DIAGNOSIS — E039 Hypothyroidism, unspecified: Secondary | ICD-10-CM | POA: Insufficient documentation

## 2022-03-21 DIAGNOSIS — F17210 Nicotine dependence, cigarettes, uncomplicated: Secondary | ICD-10-CM

## 2022-03-21 DIAGNOSIS — J449 Chronic obstructive pulmonary disease, unspecified: Secondary | ICD-10-CM

## 2022-03-21 DIAGNOSIS — N189 Chronic kidney disease, unspecified: Secondary | ICD-10-CM | POA: Insufficient documentation

## 2022-03-21 DIAGNOSIS — M1990 Unspecified osteoarthritis, unspecified site: Secondary | ICD-10-CM | POA: Insufficient documentation

## 2022-03-21 DIAGNOSIS — R911 Solitary pulmonary nodule: Secondary | ICD-10-CM | POA: Diagnosis not present

## 2022-03-21 DIAGNOSIS — I739 Peripheral vascular disease, unspecified: Secondary | ICD-10-CM | POA: Diagnosis not present

## 2022-03-21 DIAGNOSIS — I129 Hypertensive chronic kidney disease with stage 1 through stage 4 chronic kidney disease, or unspecified chronic kidney disease: Secondary | ICD-10-CM | POA: Insufficient documentation

## 2022-03-21 DIAGNOSIS — I10 Essential (primary) hypertension: Secondary | ICD-10-CM

## 2022-03-21 DIAGNOSIS — Z1152 Encounter for screening for COVID-19: Secondary | ICD-10-CM | POA: Diagnosis not present

## 2022-03-21 DIAGNOSIS — R918 Other nonspecific abnormal finding of lung field: Secondary | ICD-10-CM | POA: Diagnosis not present

## 2022-03-21 DIAGNOSIS — J4489 Other specified chronic obstructive pulmonary disease: Secondary | ICD-10-CM | POA: Diagnosis not present

## 2022-03-21 DIAGNOSIS — J439 Emphysema, unspecified: Secondary | ICD-10-CM | POA: Diagnosis not present

## 2022-03-21 DIAGNOSIS — C3410 Malignant neoplasm of upper lobe, unspecified bronchus or lung: Secondary | ICD-10-CM | POA: Diagnosis not present

## 2022-03-21 HISTORY — PX: BRONCHIAL BIOPSY: SHX5109

## 2022-03-21 HISTORY — PX: BRONCHIAL BRUSHINGS: SHX5108

## 2022-03-21 HISTORY — PX: BRONCHIAL WASHINGS: SHX5105

## 2022-03-21 HISTORY — PX: FIDUCIAL MARKER PLACEMENT: SHX6858

## 2022-03-21 HISTORY — PX: BRONCHIAL NEEDLE ASPIRATION BIOPSY: SHX5106

## 2022-03-21 LAB — CBC
HCT: 43.3 % (ref 39.0–52.0)
Hemoglobin: 14.4 g/dL (ref 13.0–17.0)
MCH: 31 pg (ref 26.0–34.0)
MCHC: 33.3 g/dL (ref 30.0–36.0)
MCV: 93.1 fL (ref 80.0–100.0)
Platelets: 182 10*3/uL (ref 150–400)
RBC: 4.65 MIL/uL (ref 4.22–5.81)
RDW: 14.6 % (ref 11.5–15.5)
WBC: 11.1 10*3/uL — ABNORMAL HIGH (ref 4.0–10.5)
nRBC: 0 % (ref 0.0–0.2)

## 2022-03-21 LAB — BASIC METABOLIC PANEL
Anion gap: 10 (ref 5–15)
BUN: 10 mg/dL (ref 8–23)
CO2: 22 mmol/L (ref 22–32)
Calcium: 8.9 mg/dL (ref 8.9–10.3)
Chloride: 107 mmol/L (ref 98–111)
Creatinine, Ser: 0.9 mg/dL (ref 0.61–1.24)
GFR, Estimated: >60 mL/min (ref >60)
Glucose, Bld: 118 mg/dL — ABNORMAL HIGH (ref 70–99)
Potassium: 3.9 mmol/L (ref 3.5–5.1)
Sodium: 139 mmol/L (ref 135–145)

## 2022-03-21 LAB — SARS CORONAVIRUS 2 BY RT PCR: SARS Coronavirus 2 by RT PCR: NEGATIVE

## 2022-03-21 SURGERY — BRONCHOSCOPY, WITH BIOPSY USING ELECTROMAGNETIC NAVIGATION
Anesthesia: General

## 2022-03-21 MED ORDER — PHENYLEPHRINE 80 MCG/ML (10ML) SYRINGE FOR IV PUSH (FOR BLOOD PRESSURE SUPPORT)
PREFILLED_SYRINGE | INTRAVENOUS | Status: DC | PRN
  Administered 2022-03-21 (×2): 80 ug via INTRAVENOUS
  Administered 2022-03-21: 160 ug via INTRAVENOUS

## 2022-03-21 MED ORDER — PROPOFOL 10 MG/ML IV BOLUS
INTRAVENOUS | Status: DC | PRN
  Administered 2022-03-21: 120 mg via INTRAVENOUS

## 2022-03-21 MED ORDER — LACTATED RINGERS IV SOLN: INTRAVENOUS | Status: DC

## 2022-03-21 MED ORDER — LIDOCAINE 2% (20 MG/ML) 5 ML SYRINGE
INTRAMUSCULAR | Status: DC | PRN
  Administered 2022-03-21: 60 mg via INTRAVENOUS

## 2022-03-21 MED ORDER — ONDANSETRON HCL 4 MG/2ML IJ SOLN
INTRAMUSCULAR | Status: DC | PRN
  Administered 2022-03-21: 4 mg via INTRAVENOUS

## 2022-03-21 MED ORDER — FENTANYL CITRATE (PF) 100 MCG/2ML IJ SOLN: 25.0000 ug | INTRAMUSCULAR | Status: DC | PRN

## 2022-03-21 MED ORDER — PROPOFOL 500 MG/50ML IV EMUL
INTRAVENOUS | Status: DC | PRN
  Administered 2022-03-21: 125 ug/kg/min via INTRAVENOUS

## 2022-03-21 MED ORDER — FENTANYL CITRATE (PF) 100 MCG/2ML IJ SOLN
INTRAMUSCULAR | Status: AC
  Filled 2022-03-21: qty 2

## 2022-03-21 MED ORDER — FENTANYL CITRATE (PF) 250 MCG/5ML IJ SOLN
INTRAMUSCULAR | Status: DC | PRN
  Administered 2022-03-21: 50 ug via INTRAVENOUS

## 2022-03-21 MED ORDER — CHLORHEXIDINE GLUCONATE 0.12 % MT SOLN
15.0000 mL | Freq: Once | OROMUCOSAL | Status: AC
  Administered 2022-03-21: 15 mL via OROMUCOSAL
  Filled 2022-03-21: qty 15

## 2022-03-21 MED ORDER — AMISULPRIDE (ANTIEMETIC) 5 MG/2ML IV SOLN: 10.0000 mg | Freq: Once | INTRAVENOUS | Status: DC | PRN

## 2022-03-21 MED ORDER — SUGAMMADEX SODIUM 200 MG/2ML IV SOLN
INTRAVENOUS | Status: DC | PRN
  Administered 2022-03-21: 200 mg via INTRAVENOUS

## 2022-03-21 MED ORDER — DEXAMETHASONE SODIUM PHOSPHATE 10 MG/ML IJ SOLN
INTRAMUSCULAR | Status: DC | PRN
  Administered 2022-03-21: 10 mg via INTRAVENOUS

## 2022-03-21 MED ORDER — CHLORHEXIDINE GLUCONATE 0.12 % MT SOLN
OROMUCOSAL | Status: AC
  Filled 2022-03-21: qty 15

## 2022-03-21 MED ORDER — ROCURONIUM BROMIDE 10 MG/ML (PF) SYRINGE
PREFILLED_SYRINGE | INTRAVENOUS | Status: DC | PRN
  Administered 2022-03-21: 20 mg via INTRAVENOUS
  Administered 2022-03-21: 40 mg via INTRAVENOUS
  Administered 2022-03-21: 20 mg via INTRAVENOUS

## 2022-03-21 SURGICAL SUPPLY — 1 items: Super Lock Fiducual merker IMPLANT

## 2022-03-21 NOTE — Interval H&P Note (Signed)
History and Physical Interval Note:  03/21/2022 7:36 AM  David David  has presented today for surgery, with the diagnosis of BILATERAL PULMONARY NODULE.  The various methods of treatment have been discussed with the patient and family. After consideration of risks, benefits and other options for treatment, the patient has consented to  Procedure(s): ROBOTIC ASSISTED NAVIGATIONAL BRONCHOSCOPY (N/A) as a surgical intervention.  The patient's history has been reviewed, patient examined, no change in status, stable for surgery.  I have reviewed the patient's chart and labs.  Questions were answered to the patient's satisfaction.     Maryjane Hurter

## 2022-03-21 NOTE — Discharge Instructions (Signed)
-   OK to resume aspirin tomorrow as long as bloody cough subsiding, plavix 1/6 on Saturday. If coughing up clot size of your thumb then call our office [phone] and if persistent, especially if you develop shortness of breath as well then plan to head to ED. - I'll call next week with results

## 2022-03-21 NOTE — Transfer of Care (Signed)
Immediate Anesthesia Transfer of Care Note  Patient: David David  Procedure(s) Performed: ROBOTIC ASSISTED NAVIGATIONAL BRONCHOSCOPY BRONCHIAL NEEDLE ASPIRATION BIOPSIES BRONCHIAL BIOPSIES FIDUCIAL MARKER PLACEMENT BRONCHIAL BRUSHINGS BRONCHIAL WASHINGS  Patient Location: PACU  Anesthesia Type:General  Level of Consciousness: drowsy and patient cooperative  Airway & Oxygen Therapy: Patient Spontanous Breathing  Post-op Assessment: Report given to RN and Post -op Vital signs reviewed and stable  Post vital signs: Reviewed and stable  Last Vitals:  Vitals Value Taken Time  BP 129/72 03/21/22 0933  Temp    Pulse 65 03/21/22 0936  Resp 17 03/21/22 0936  SpO2 94 % 03/21/22 0936  Vitals shown include unvalidated device data.  Last Pain:  Vitals:   03/21/22 0640  TempSrc:   PainSc: 0-No pain         Complications: No notable events documented.

## 2022-03-21 NOTE — Op Note (Signed)
Video Bronchoscopy with Robotic Assisted Bronchoscopic Navigation   Date of Operation: 03/21/2022   Pre-op Diagnosis: pulmonary nodules  Post-op Diagnosis: same  Surgeon: Walker Shadow   Anesthesia: General endotracheal anesthesia  Operation: Flexible video fiberoptic bronchoscopy with robotic assistance and biopsies.  Estimated Blood Loss: Minimal  Complications: None  Indications and History: BRENYN PETREY is a 72 y.o. male with history of pulmonary nodules. The risks, benefits, complications, treatment options and expected outcomes were discussed with the patient.  The possibilities of pneumothorax, pneumonia, reaction to medication, pulmonary aspiration, perforation of a viscus, bleeding, failure to diagnose a condition and creating a complication requiring transfusion or operation were discussed with the patient who freely signed the consent.    Description of Procedure: The patient was seen in the Preoperative Area, was examined and was deemed appropriate to proceed.  The patient was taken to [facility] endoscopy room 3, identified as [name] and the procedure verified as Flexible Video Fiberoptic Bronchoscopy.  A Time Out was held and the above information confirmed.   Prior to the date of the procedure a high-resolution CT scan of the chest was performed. Utilizing ION software program a virtual tracheobronchial tree was generated to allow the creation of distinct navigation pathways to the patient's parenchymal abnormalities. After being taken to the operating room general anesthesia was initiated and the patient  was orally intubated. The video fiberoptic bronchoscope was introduced via the endotracheal tube and a general inspection was performed which showed normal right and left lung anatomy, aspiration of the bilateral mainstems was completed to remove any remaining secretions. Robotic catheter inserted into patient's endotracheal tube.   Target #1 RUL nodule: The distinct  navigation pathways prepared prior to this procedure were then utilized to navigate to patient's lesion identified on CT scan. CIOS imaging was used to aid navigation and confirm ideal location for biopsy. The robotic catheter was secured into place and the vision probe was withdrawn.  Lesion location was approximated using fluoroscopy for peripheral targeting. Under fluoroscopic guidance transbronchial needle biopsies, and transbronchial forceps biopsies were performed to be sent for cytology and pathology. A fiducial marker was left.  Target #2 Lingula nodule: The distinct navigation pathways prepared prior to this procedure were then utilized to navigate to patient's lesion identified on CT scan. CIOS imaging was used to aid navigation and confirm ideal location for biopsy and later to confirm position of needle within lesion. The robotic catheter was secured into place and the vision probe was withdrawn.  Lesion location was approximated using fluoroscopy peripheral targeting. Under fluoroscopic guidance transbronchial needle brushings, transbronchial needle biopsies, and transbronchial forceps biopsies were performed to be sent for cytology and pathology. A fiducial marker was left. A bronchioalveolar lavage was performed in the lingula and sent for cytology.    At the end of the procedure a general airway inspection was performed and there was no evidence of active bleeding. The bronchoscope was removed.  The patient tolerated the procedure well. There was no significant blood loss and there were no obvious complications. A post-procedural chest x-ray is pending.  Samples Target #1 RUL nodule: 1. Transbronchial Wang needle biopsies from RUL nodule 2. Transbronchial forceps biopsies from RUL nodule 3. Endobronchial biopsies from RUL nodule  Samples Target #2 Lingula nodule: 1. Transbronchial needle brushings from lingula nodule 2. Transbronchial Wang needle biopsies from  lingula nodule 3.  Transbronchial forceps biopsies from  lingula nodule 4. Bronchoalveolar lavage from  lingula nodule 5. Endobronchial biopsies from  lingula nodule  Plans:  The patient will be discharged from the PACU to home when recovered from anesthesia and after chest x-ray is reviewed. We will review the cytology, pathology and microbiology results with the patient when they become available. Outpatient followup will be with me tentatively in a month or so pending bronchoscopy results.

## 2022-03-21 NOTE — Anesthesia Procedure Notes (Signed)
Procedure Name: Intubation Date/Time: 03/21/2022 8:00 AM  Performed by: Thelma Comp, CRNAPre-anesthesia Checklist: Patient identified, Emergency Drugs available, Suction available and Patient being monitored Patient Re-evaluated:Patient Re-evaluated prior to induction Oxygen Delivery Method: Circle System Utilized Preoxygenation: Pre-oxygenation with 100% oxygen Induction Type: IV induction Ventilation: Mask ventilation without difficulty Laryngoscope Size: Mac and 4 Grade View: Grade I Tube type: Oral Tube size: 8.5 mm Number of attempts: 1 Airway Equipment and Method: Stylet Placement Confirmation: ETT inserted through vocal cords under direct vision, positive ETCO2 and breath sounds checked- equal and bilateral Secured at: 22 cm Tube secured with: Tape Dental Injury: Teeth and Oropharynx as per pre-operative assessment

## 2022-03-22 NOTE — Anesthesia Postprocedure Evaluation (Signed)
Anesthesia Post Note  Patient: David David  Procedure(s) Performed: ROBOTIC ASSISTED NAVIGATIONAL BRONCHOSCOPY BRONCHIAL NEEDLE ASPIRATION BIOPSIES BRONCHIAL BIOPSIES FIDUCIAL MARKER PLACEMENT BRONCHIAL BRUSHINGS BRONCHIAL WASHINGS     Patient location during evaluation: PACU Anesthesia Type: General Level of consciousness: awake and alert Pain management: pain level controlled Vital Signs Assessment: post-procedure vital signs reviewed and stable Respiratory status: spontaneous breathing, nonlabored ventilation, respiratory function stable and patient connected to nasal cannula oxygen Cardiovascular status: blood pressure returned to baseline and stable Postop Assessment: no apparent nausea or vomiting Anesthetic complications: no   No notable events documented.  Last Vitals:  Vitals:   03/21/22 0945 03/21/22 1000  BP: 123/74 123/69  Pulse: 64 (!) 59  Resp: 10 16  Temp:  36.8 C  SpO2: 92% 90%    Last Pain:  Vitals:   03/21/22 1000  TempSrc:   PainSc: 0-No pain                 Tiajuana Amass

## 2022-03-24 ENCOUNTER — Encounter (HOSPITAL_COMMUNITY): Payer: Self-pay | Admitting: Student

## 2022-03-25 ENCOUNTER — Telehealth: Payer: Self-pay | Admitting: Student

## 2022-03-25 DIAGNOSIS — C3490 Malignant neoplasm of unspecified part of unspecified bronchus or lung: Secondary | ICD-10-CM

## 2022-03-25 LAB — CYTOLOGY - NON PAP

## 2022-03-25 NOTE — Telephone Encounter (Signed)
Attempted to call to discuss results of bronchoscopy - metastatic lung cancer vs synchronous primaries. Ordered MR brain and referred to [facility], radiation oncology. Will plan to discuss case at tumor board.  [address]

## 2022-03-26 NOTE — Telephone Encounter (Signed)
PT calling and is confused. Would like Dr. Verlee Monte to call him back please. Cancer center called him this morning. They called him but would not sched it for you. Please see last signed Tel .encounter. What is next step for PT? Does he call, will we set up [place] [facility] ?

## 2022-03-27 ENCOUNTER — Ambulatory Visit (HOSPITAL_COMMUNITY)
Admission: RE | Admit: 2022-03-27 | Discharge: 2022-03-27 | Disposition: A | Discharge status: Home / Self Care | Payer: No Typology Code available for payment source | Source: Ambulatory Visit | Attending: Student | Admitting: Student

## 2022-03-27 DIAGNOSIS — J341 Cyst and mucocele of nose and nasal sinus: Secondary | ICD-10-CM | POA: Diagnosis not present

## 2022-03-27 DIAGNOSIS — C3490 Malignant neoplasm of unspecified part of unspecified bronchus or lung: Secondary | ICD-10-CM

## 2022-03-27 DIAGNOSIS — Q283 Other malformations of cerebral vessels: Secondary | ICD-10-CM | POA: Diagnosis not present

## 2022-03-27 DIAGNOSIS — Z8673 Personal history of transient ischemic attack (TIA), and cerebral infarction without residual deficits: Secondary | ICD-10-CM | POA: Diagnosis not present

## 2022-03-27 MED ORDER — GADOBUTROL 1 MMOL/ML IV SOLN
7.5000 mL | Freq: Once | INTRAVENOUS | Status: AC | PRN
  Administered 2022-03-27: 7.5 mL via INTRAVENOUS

## 2022-03-27 NOTE — Telephone Encounter (Signed)
Spoke with patient he states the cancer center had called him yesterday but would not speak with him without him talking to Dr. Verlee Monte first. David David states he has been scheduled for an MRI today and is unsure of the diagnosis/reason for that. He does request that if he has cancer can he be referred to the [facility].   Dr. Verlee Monte can you please advise?

## 2022-03-28 ENCOUNTER — Telehealth: Payer: Self-pay | Admitting: Student

## 2022-03-28 NOTE — Telephone Encounter (Signed)
Already has PFTs scheduled 1/23 and clinic visit. He will likely pursue treatment with OSH oncologist and thoracic surgeon at [facility]. Recommendation from tumor board was for PFTs then resection of RUL lesion, SBRT to lingula if sufficient pulmonary reserve for surgery for what likely represents synchronous primaries.   [place]

## 2022-03-28 NOTE — Telephone Encounter (Signed)
Pls see last signed Tel Encounter and call back daughter w/Dr's notes. TY.

## 2022-03-28 NOTE — Telephone Encounter (Signed)
Sir,  Can you call patient or daughter back to go over results.   I looked at last telephone encounter and I know you said it was metastatic lung cancer vs synchronous primaries. And I did not want to give patient wrong information.  Please advise sir

## 2022-03-29 ENCOUNTER — Other Ambulatory Visit: Payer: Self-pay

## 2022-03-29 NOTE — Progress Notes (Signed)
The proposed treatment discussed in conference is for discussion purpose only and is not a binding recommendation.  The patients have not been physically examined, or presented with their treatment options.  Therefore, final treatment plans cannot be decided.  

## 2022-04-03 ENCOUNTER — Telehealth: Payer: Self-pay | Admitting: Radiation Oncology

## 2022-04-03 NOTE — Telephone Encounter (Signed)
Called patient to schedule a consultation w. Dr. Roselind Messier. No answer, LVM for a return call.

## 2022-04-08 NOTE — Progress Notes (Signed)
Synopsis: Referred for emphysema and multiple pulmonary nodules by [place], [name], MD  Subjective:   PATIENT ID: David David GENDER: male DOB: 15-Jun-1950, MRN: [id number]  Chief Complaint  Patient presents with   Follow-up    F/U after PFT. States he has been stable since last visit.    72yM with history of smoking, emphysema, multiple pulmonary nodules, GERD, PAD, CKD  Has had recent unintentional weight loss with workup revealing for multiple pulmonary nodules - dominant ~2cm RUL Maybe 20 lb weight loss. ?Weakly pet avid.   Negative c-scope (30mm hyperplastic polyp), EGD  Cough worsening over the past year, clear sputum production. No hemoptysis. No significant DOE.  He has been prescribed spiriva but he's not using it.   He takes aspirin 81 mg daily, plavix 75 mg daily. He has a left popliteal stent which was placed 2010. He has had either angioplasty or thrombectomy RLE a couple months ago.   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 Was in [place]. He has 30+ py smoking history, still smoking 0.5ppd.   Interval HPI: Underwent bronch and found to have suspected synchronous primaries both stage I NSCLC  Encouraged to start spiriva and stop smoking last visit  PFTs today with mild obstruction, moderately reduced diffusing capacity.   Otherwise pertinent review of systems is negative.  Past Medical History:  Diagnosis Date   Alcoholism in family    Anxiety    Arthritis    Depressive disorder, not elsewhere classified    Diverticulosis of colon (without mention of hemorrhage)    Dysfunction of eustachian tube    Exposure to STD    Family history of diabetes mellitus    Family history of psychiatric condition    family Hx of suicide    GERD (gastroesophageal reflux disease)    Impotence of organic origin    erectile dysfunction   Lipoprotein deficiencies     Other diseases of lung, not elsewhere classified    pulmonary nodule   Peripheral vascular disease, unspecified (HCC)    lower extremity peripheral artery disease   Personal history of colonic polyps    Routine general medical examination at a health care facility    Seizures (HCC)    Sprain of neck    Hx   Thyroid disease    Tobacco use disorder    Unspecified hypertensive kidney disease with chronic kidney disease stage I through stage IV, or unspecified(403.90)    cholesterol embolization syndrome     Family History  Problem Relation Age of Onset   Congestive Heart Failure Mother    CAD Father    Alzheimer's disease Sister    Lung cancer Brother        smoking hx   Alzheimer's disease Brother    Diabetes Brother    Colon cancer Neg Hx    Stomach cancer Neg Hx    Esophageal cancer Neg Hx    Colon polyps Neg Hx       Past Surgical History:  Procedure Laterality Date   ABDOMINAL AORTOGRAM W/LOWER EXTREMITY N/A 11/07/2021   Procedure: ABDOMINAL AORTOGRAM W/LOWER EXTREMITY;  Surgeon: Iran Ouch, MD;  Location: [facility] INVASIVE CV LAB;  Service: Cardiovascular;  Laterality: N/A;   angiography     bilateral lower extremity angiography, stenting of the left.    BRONCHIAL BIOPSY  03/21/2022   Procedure: BRONCHIAL BIOPSIES;  Surgeon: Felisa Bonier  M, MD;  Location: [facility] ENDOSCOPY;  Service: Pulmonary;;   BRONCHIAL BRUSHINGS  03/21/2022   Procedure: BRONCHIAL BRUSHINGS;  Surgeon: Omar Person, MD;  Location: [facility] ENDOSCOPY;  Service: Pulmonary;;   BRONCHIAL NEEDLE ASPIRATION BIOPSY  03/21/2022   Procedure: BRONCHIAL NEEDLE ASPIRATION BIOPSIES;  Surgeon: Omar Person, MD;  Location: [facility] ENDOSCOPY;  Service: Pulmonary;;   BRONCHIAL WASHINGS  03/21/2022   Procedure: BRONCHIAL WASHINGS;  Surgeon: Omar Person, MD;  Location: [facility] ENDOSCOPY;  Service: Pulmonary;;   CARDIAC CATHETERIZATION N/A 07/19/2014   Procedure: Left Heart Cath and Coronary Angiography;  Surgeon: Marykay Lex, MD;  Location: [facility] INVASIVE CV LAB CUPID;  Service: Cardiovascular;  Laterality: N/A;   CATARACT EXTRACTION Bilateral    EYE SURGERY  2016   blown macular   FIDUCIAL MARKER PLACEMENT  03/21/2022   Procedure: FIDUCIAL MARKER PLACEMENT;  Surgeon: Omar Person, MD;  Location: [facility] ENDOSCOPY;  Service: Pulmonary;;   KNEE ARTHROSCOPY Left    x 2   LOWER EXTREMITY ANGIOGRAM N/A 11/04/2012   Procedure: LOWER EXTREMITY ANGIOGRAM;  Surgeon: Iran Ouch, MD;  Location: [facility] CATH LAB;  Service: Cardiovascular;  Laterality: N/A;   POPLITEAL ARTERY ANGIOPLASTY     didnt specify angioplasty or stent   SPERMATOCELECTOMY Left    testicular cyst   VASECTOMY      Social History   Socioeconomic History   Marital status: Single    Spouse name: Not on file   Number of children: 1   Years of education: Not on file   Highest education level: Not on file  Occupational History   Occupation: retired  Tobacco Use   Smoking status: Every Day    Packs/day: 1.00    Years: 50.00    Total pack years: 50.00    Types: Cigarettes   Smokeless tobacco: Never   Tobacco comments:    Cutting back   Vaping Use   Vaping Use: Former  Substance and Sexual Activity   Alcohol use: Yes    Alcohol/week: 0.0 standard drinks of alcohol    Comment: social   Drug use: Yes    Types: Marijuana    Comment: Last use was on 02/2022   Sexual activity: Not on file  Other Topics Concern   Not on file  Social History Narrative   No diet, not restricting cholesterol.   Social Determinants of Health   Financial Resource Strain: Not on file  Food Insecurity: Not on file  Transportation Needs: Not on file  Physical Activity: Not on file  Stress: Not on file  Social Connections: Not on file  Intimate Partner Violence: Not on file     Allergies  Allergen Reactions   Wellbutrin [Bupropion] Other (See Comments)    Possible seizure    Flomax [Tamsulosin] Swelling   Ritalin [Methylphenidate] Other (See  Comments)    Unknown reaction - too strong?   Tape Other (See Comments)    "Plastic" tape TEARS THE SKIN; please use paper tape or Coban wrap!!   Trazodone Other (See Comments)    To strong   Neomycin-Bacitracin Zn-Polymyx Rash   Niacin     Flushing    Paroxetine Other (See Comments)    Makes the patient feel SEVERELY SEDATED/LETHARGIC/CANNOT SPEAK WORDS Lethargy (intolerance) REACTION: goofy     Outpatient Medications Prior to Visit  Medication Sig Dispense Refill   albuterol (PROVENTIL HFA;VENTOLIN HFA) 108 (90 Base) MCG/ACT inhaler Inhale 2 puffs into the lungs every 6 (six) hours as  needed for wheezing or shortness of breath.     alfuzosin (UROXATRAL) 10 MG 24 hr tablet Take 10 mg by mouth at bedtime.  11   amLODipine (NORVASC) 10 MG tablet Take 5 mg by mouth daily.     ammonium lactate (LAC-HYDRIN) 12 % lotion Apply 1 application topically 2 (two) times daily as needed for dry skin.     aspirin EC 81 MG tablet Take 1 tablet (81 mg total) by mouth daily. 90 tablet 3   clopidogrel (PLAVIX) 75 MG tablet Take 1 tablet (75 mg total) by mouth daily. 30 tablet 3   escitalopram (LEXAPRO) 10 MG tablet Take 30 mg by mouth every morning.      ferrous sulfate 325 (65 FE) MG tablet Take 325 mg by mouth 3 (three) times a week. Take on Monday, Wednesday, Friday     gabapentin (NEURONTIN) 300 MG capsule Take 600 mg by mouth 2 (two) times daily.     Lactobacillus (ACIDOPHILUS) CAPS capsule Take 1 capsule by mouth daily.     mirtazapine (REMERON) 15 MG tablet Take 1 tablet (15 mg total) by mouth at bedtime. 30 tablet 2   Multiple Vitamin (MULTI-VITAMIN) tablet Take 1 tablet by mouth daily. theragran     nitroGLYCERIN (NITRODUR - DOSED IN MG/24 HR) 0.2 mg/hr patch Place 1 patch (0.2 mg total) onto the skin daily. (Patient taking differently: Place 0.2 mg onto the skin daily as needed (Apply to foot for blood flow).) 30 patch 12   Omega-3 Fatty Acids (FISH OIL) 1000 MG CAPS Take 1,000 mg by mouth 2  (two) times daily.      omeprazole (PRILOSEC) 40 MG capsule Take 40 mg by mouth daily.     pravastatin (PRAVACHOL) 40 MG tablet Take 40 mg by mouth at bedtime.     zolpidem (AMBIEN) 10 MG tablet Take 10 mg by mouth at bedtime as needed for sleep.     Tiotropium Bromide Monohydrate (SPIRIVA RESPIMAT) 2.5 MCG/ACT AERS Inhale 2 puffs into the lungs daily. 4 g 0   Facility-Administered Medications Prior to Visit  Medication Dose Route Frequency Provider Last Rate Last Admin   0.9 %  sodium chloride infusion  500 mL Intravenous Continuous Hilarie Fredrickson, MD       sodium chloride flush (NS) 0.9 % injection 3 mL  3 mL Intravenous Q12H Iran Ouch, MD           Objective:   Physical Exam:  General appearance: 72 y.o., male, NAD, conversant  Eyes: anicteric sclerae; PERRL, tracking appropriately HENT: NCAT; MMM Neck: Trachea midline; no lymphadenopathy, no JVD Lungs: CTAB, no crackles, no wheeze, with normal respiratory effort CV: RRR, no murmur  Abdomen: Soft, non-tender; non-distended, BS present  Extremities: No peripheral edema, warm Skin: Normal turgor and texture; no rash Psych: Appropriate affect Neuro: Alert and oriented to person and place, no focal deficit     Vitals:   04/09/22 1404  BP: 138/72  Pulse: 68  SpO2: 98%  Weight: 169 lb (76.7 kg)  Height: 6\' 3"  (1.905 m)    98% on RA BMI Readings from Last 3 Encounters:  04/09/22 21.12 kg/m  03/21/22 20.62 kg/m  03/14/22 20.87 kg/m   Wt Readings from Last 3 Encounters:  04/09/22 169 lb (76.7 kg)  03/21/22 165 lb (74.8 kg)  03/14/22 167 lb (75.8 kg)     CBC    Component Value Date/Time   WBC 11.1 (H) 03/21/2022 0617   RBC 4.65 03/21/2022 0617  HGB 14.4 03/21/2022 0617   HGB 15.0 11/06/2021 0839   HCT 43.3 03/21/2022 0617   HCT 44.5 11/06/2021 0839   PLT 182 03/21/2022 0617   PLT 162 11/06/2021 0839   MCV 93.1 03/21/2022 0617   MCV 89 11/06/2021 0839   MCH 31.0 03/21/2022 0617   MCHC 33.3  03/21/2022 0617   RDW 14.6 03/21/2022 0617   RDW 14.5 11/06/2021 0839   LYMPHSABS 0.8 06/03/2019 2219   MONOABS 0.5 06/03/2019 2219   EOSABS 0.0 06/03/2019 2219   BASOSABS 0.0 06/03/2019 2219      Chest Imaging:  02/04/22:       Pulmonary Functions Testing Results:    Latest Ref Rng & Units 04/09/2022   10:51 AM  PFT Results  FVC-Pre L 4.55  P  FVC-Predicted Pre % 87  P  FVC-Post L 4.45  P  FVC-Predicted Post % 85  P  Pre FEV1/FVC % % 67  P  Post FEV1/FCV % % 68  P  FEV1-Pre L 3.05  P  FEV1-Predicted Pre % 80  P  FEV1-Post L 3.03  P  DLCO uncorrected ml/min/mmHg 13.72  P  DLCO UNC% % 46  P  DLCO corrected ml/min/mmHg 13.80  P  DLCO COR %Predicted % 46  P  DLVA Predicted % 52  P  TLC L 7.18  P  TLC % Predicted % 89  P  RV % Predicted % 73  P    P Preliminary result   Last PFT 2018: FEV1/FVC 58%, FEV1 101%, DLCO 70% pred    Assessment & Plan:   # Suspected right and left primary lung adenocarcinoma, both clinical stage I  # Smoking  # COPD gold group B # Emphysema  Plan: - good functional status but DLCO 46% predicted and may need additional testing for pre-op risk stratification if felt to still be possible resection candidate by surgery, sees Dr. Clovis Riley with [facility] this week - following with Dr. Elby Showers with Onc at [facility] - sees Dr. Roselind Messier 1/29 to discuss SBRT to left lingula primary and option of XRT vs resection for RUL primary depending on candidacy for surgery  - continue spiriva 2 puffs daily - underscored importance of smoking cessation, he'll continue to work at it, declines chantix, patches, gum  RTC 3 months tentatively   Omar Person, MD [facility] 04/09/2022 2:24 PM

## 2022-04-08 NOTE — Progress Notes (Signed)
Thoracic Location of Tumor / Histology: Right and left Lung   Biopsies of lung (if applicable) revealed: 03/21/2022  CYTOLOGY - NON PAP CASE: [id number] PATIENT: David David Non-Gynecological Cytology Report     Clinical History: None provided    FINAL MICROSCOPIC DIAGNOSIS:  A.  RIGHT LUNG, UPPER LOBE, FINE NEEDLE ASPIRATION: - Malignant - Adenocarcinoma  B.  LEFT LUNG, LINGULA, FINE NEEDLE ASPIRATION: - Malignant - Adenocarcinoma  C.  LEFT LUNG, LINGULA, BRUSHING: - No malignant cells identified - Scant cellularity; benign bronchial cells and pulmonary macrophages    Tobacco/Marijuana/Snuff/ETOH use: {:18581}  Past/Anticipated interventions by cardiothoracic surgery, if any:  Video Bronchoscopy with Robotic Assisted Bronchoscopic Navigation    Date of Operation: 03/21/2022    Pre-op Diagnosis: pulmonary nodules   Post-op Diagnosis: same   Surgeon: Judithann Graves     Anesthesia: General endotracheal anesthesia   Operation: Flexible video fiberoptic bronchoscopy with robotic assistance and biopsies.    Past/Anticipated interventions by medical oncology, if any:      Signs/Symptoms Weight changes, if any: {:18581} Respiratory complaints, if any: {:18581} Hemoptysis, if any: {:18581} Pain issues, if any:  {:18581}  SAFETY ISSUES: Prior radiation? {:18581} Pacemaker/ICD? {:18581}  Possible current pregnancy?{:18581} Is the patient on methotrexate? {:18581}  Current Complaints / other details:  ***

## 2022-04-09 ENCOUNTER — Encounter: Payer: Self-pay | Admitting: Student

## 2022-04-09 ENCOUNTER — Ambulatory Visit (INDEPENDENT_AMBULATORY_CARE_PROVIDER_SITE_OTHER): Payer: No Typology Code available for payment source | Admitting: Student

## 2022-04-09 VITALS — BP 138/72 | HR 68 | Ht 75.0 in | Wt 169.0 lb

## 2022-04-09 DIAGNOSIS — J449 Chronic obstructive pulmonary disease, unspecified: Secondary | ICD-10-CM | POA: Diagnosis not present

## 2022-04-09 DIAGNOSIS — C3490 Malignant neoplasm of unspecified part of unspecified bronchus or lung: Secondary | ICD-10-CM | POA: Diagnosis not present

## 2022-04-09 LAB — PULMONARY FUNCTION TEST
DL/VA % pred: 52 %
DL/VA: 2.05 ml/min/mmHg/L
DLCO cor % pred: 46 %
DLCO cor: 13.8 ml/min/mmHg
DLCO unc % pred: 46 %
DLCO unc: 13.72 ml/min/mmHg
FEF 25-75 Post: 1.68 L/sec
FEF 25-75 Pre: 1.69 L/sec
FEF2575-%Change-Post: 0 %
FEF2575-%Pred-Post: 59 %
FEF2575-%Pred-Pre: 60 %
FEV1-%Change-Post: 0 %
FEV1-%Pred-Post: 79 %
FEV1-%Pred-Pre: 80 %
FEV1-Post: 3.03 L
FEV1-Pre: 3.05 L
FEV1FVC-%Change-Post: 1 %
FEV1FVC-%Pred-Pre: 91 %
FEV6-%Change-Post: 0 %
FEV6-%Pred-Post: 89 %
FEV6-%Pred-Pre: 90 %
FEV6-Post: 4.41 L
FEV6-Pre: 4.44 L
FEV6FVC-%Change-Post: 2 %
FEV6FVC-%Pred-Post: 105 %
FEV6FVC-%Pred-Pre: 102 %
FVC-%Change-Post: -2 %
FVC-%Pred-Post: 85 %
FVC-%Pred-Pre: 87 %
FVC-Post: 4.45 L
FVC-Pre: 4.55 L
Post FEV1/FVC ratio: 68 %
Post FEV6/FVC ratio: 100 %
Pre FEV1/FVC ratio: 67 %
Pre FEV6/FVC Ratio: 97 %
RV % pred: 73 %
RV: 2.05 L
TLC % pred: 89 %
TLC: 7.18 L

## 2022-04-09 MED ORDER — SPIRIVA RESPIMAT 2.5 MCG/ACT IN AERS: 2.0000 | INHALATION_SPRAY | Freq: Every day | RESPIRATORY_TRACT | 6 refills | Status: AC

## 2022-04-09 NOTE — Progress Notes (Signed)
Full PFT performed today.

## 2022-04-09 NOTE — Patient Instructions (Addendum)
-  Spiriva 2 puffs daily - albuterol as needed - consensus from tumor board discussion was that you have two primary tumors rather than metastatic lung adenocarcinoma. And that provided your lung function showed adequate reserve then given your good functional status overall you may be candidate for surgical resection of right upper lobe tumor and radiation to left upper lobe adenocarcinoma. - try your best to stop smoking - clinic number [phone], my cell phone in case Dr. Elby Showers or Dr. Clovis Riley need it: [phone]

## 2022-04-09 NOTE — Patient Instructions (Signed)
Full PFT performed today.

## 2022-04-12 NOTE — Progress Notes (Signed)
Radiation Oncology         [phone]) [phone] ________________________________  Initial Outpatient Consultation  Name: David David MRN: [id number]  Date: 04/15/2022  DOB: [birth year]  YC:[id number], Malka So, MD  Beatris Si, MD   REFERRING PHYSICIAN: Beatris Si, MD  DIAGNOSIS: The primary encounter diagnosis was Solitary pulmonary nodule. A diagnosis of Pulmonary nodules was also pertinent to this visit.  Bilateral lung cancers: Adenocarcinoma of the right upper lobe and left lingula   HISTORY OF PRESENT ILLNESS::David David is a 72 y.o. male current smoker. Today, he accompanied by his daughter. he is seen as a courtesy of Dr. Alroy Dust for an opinion concerning radiation therapy as part of management for his recently diagnosed bilateral lung cancers.   The patient initially presented to his GI this past August with the cc of a decrease in appetite with a 20 pound weight loss. His symptoms prompted a CT of the abdomen and pelvis on 11/26/21 which revealed multiple pulmonary nodules in the right lung, and scarring in the peripheral bilateral lower lobes. No acute abnormalities were identified in the abdomen or pelvis.    The patient was then referred to Dr. Merrie Roof, [facility] medical oncology, on 01/31/22, who recommended proceeding with a chest CT to better evaluate the pulmonary nodules.   Chest CT without contrast on 02/04/22 further demonstrated: an increase in size of one of the dominant RUL nodules, measuring 3.1 cm in the greatest extent; a stable left lower nodule measuring 11 mm; stability of various other nodules in both lungs; a new area of thickening along the margin of a cystic structure in the RUL; and a new area of airspace opacity in the LLL which appeared to be likely infectious/inflammatory in etiology.    PET scan performed on 02/06/22 at the [facility] showed hypermetabolic activity within an enlarging peripheral lingular nodule measuring 1.2 cm, and mild hypermetabolic activity in  the known enlarging peripheral right upper lobe mass concerning for bronchogenic carcinoma. PET otherwise showed no evidence of hypermetabolic mediastinal or hilar lymphadenopathy, or evidence of metastatic disease in the neck, abdomen, or pelvis.   The patient was accordingly referred to Dr. Verlee Monte [facility] Pulmonary) on 03/14/22 for further evaluation and management. During this visit, the patient endorsed a worsening cough over the past year with clear sputum production. He denied any other respiratory symptoms.   For bronchoscopy planning, Dr. Verlee Monte ordered a super D chest CT, performed on 03/21/22, which demonstrated: an aggressive appearing macrolobulated mass in the periphery of the RUL concerning for potential neoplasm, and relative stability of multiple other small pulmonary nodules scattered throughout the lungs bilaterally.  CT otherwise showed no definite evidence of lymphadenopathy.   The patient opted to proceed with bronchoscopy for biopsies on 03/21/21. Biopsies of the RUL and left lingula showed findings consistent with adenocarcinoma. Lavage of the left lingula also revealed findings consistent with adenocarcinoma.   PFT performed during his most recent follow-up visit with Dr. Verlee Monte on 04/09/22 noted mild obstruction, and moderately reduced diffusing capacity.    In terms or radiation therapy, Dr. Verlee Monte as referred the patient to me for discussion of SBRT to the left lingula primary. We will also review the role of radiation to the RUL primary in the event that he does not qualify for surgical resection. The patient will continue to follow with Dr. Merrie Roof at [facility] medical oncology.   Other pertinent imaging performed thus far includes an MRI of brain with and without contrast on 03/27/22  which shows no evidence of intracranial metastatic disease.   Of note: the patient also had a lesion on his right temple biopsied on 02/18/22 which showed melanoma in situ.  He is scheduled to have  additional surgery concerning this lesion in the next few days.  PREVIOUS RADIATION THERAPY: No  PAST MEDICAL HISTORY:  Past Medical History:  Diagnosis Date   Alcoholism in family    Anxiety    Arthritis    Depressive disorder, not elsewhere classified    Diverticulosis of colon (without mention of hemorrhage)    Dysfunction of eustachian tube    Exposure to STD    Family history of diabetes mellitus    Family history of psychiatric condition    family Hx of suicide    GERD (gastroesophageal reflux disease)    Impotence of organic origin    erectile dysfunction   Lipoprotein deficiencies    Other diseases of lung, not elsewhere classified    pulmonary nodule   Peripheral vascular disease, unspecified ([place])    lower extremity peripheral artery disease   Personal history of colonic polyps    Routine general medical examination at a health care facility    Seizures ([place])    Sprain of neck    Hx   Thyroid disease    Tobacco use disorder    Unspecified hypertensive kidney disease with chronic kidney disease stage I through stage IV, or unspecified(403.90)    cholesterol embolization syndrome    PAST SURGICAL HISTORY: Past Surgical History:  Procedure Laterality Date   ABDOMINAL AORTOGRAM W/LOWER EXTREMITY N/A 11/07/2021   Procedure: ABDOMINAL AORTOGRAM W/LOWER EXTREMITY;  Surgeon: Wellington Hampshire, MD;  Location: [place] CV LAB;  Service: Cardiovascular;  Laterality: N/A;   angiography     bilateral lower extremity angiography, stenting of the left.    BRONCHIAL BIOPSY  03/21/2022   Procedure: BRONCHIAL BIOPSIES;  Surgeon: Maryjane Hurter, MD;  Location: [place];  Service: Pulmonary;;   BRONCHIAL BRUSHINGS  03/21/2022   Procedure: BRONCHIAL BRUSHINGS;  Surgeon: Maryjane Hurter, MD;  Location: [place];  Service: Pulmonary;;   BRONCHIAL NEEDLE ASPIRATION BIOPSY  03/21/2022   Procedure: BRONCHIAL NEEDLE ASPIRATION BIOPSIES;  Surgeon: Maryjane Hurter, MD;   Location: [place];  Service: Pulmonary;;   BRONCHIAL WASHINGS  03/21/2022   Procedure: BRONCHIAL WASHINGS;  Surgeon: Maryjane Hurter, MD;  Location: [name] ENDOSCOPY;  Service: Pulmonary;;   CARDIAC CATHETERIZATION N/A 07/19/2014   Procedure: Left Heart Cath and Coronary Angiography;  Surgeon: Leonie Man, MD;  Location: [facility] INVASIVE CV LAB CUPID;  Service: Cardiovascular;  Laterality: N/A;   CATARACT EXTRACTION Bilateral    EYE SURGERY  2016   blown macular   FIDUCIAL MARKER PLACEMENT  03/21/2022   Procedure: FIDUCIAL MARKER PLACEMENT;  Surgeon: Maryjane Hurter, MD;  Location: [facility] ENDOSCOPY;  Service: Pulmonary;;   KNEE ARTHROSCOPY Left    x 2   LOWER EXTREMITY ANGIOGRAM N/A 11/04/2012   Procedure: LOWER EXTREMITY ANGIOGRAM;  Surgeon: Wellington Hampshire, MD;  Location: [place] CATH LAB;  Service: Cardiovascular;  Laterality: N/A;   POPLITEAL ARTERY ANGIOPLASTY     didnt specify angioplasty or stent   SPERMATOCELECTOMY Left    testicular cyst   VASECTOMY      FAMILY HISTORY:  Family History  Problem Relation Age of Onset   Congestive Heart Failure Mother    CAD Father    Alzheimer's disease Sister    Lung cancer Brother  smoking hx   Alzheimer's disease Brother    Diabetes Brother    Colon cancer Neg Hx    Stomach cancer Neg Hx    Esophageal cancer Neg Hx    Colon polyps Neg Hx     SOCIAL HISTORY:  Social History   Tobacco Use   Smoking status: Every Day    Packs/day: 1.00    Years: 50.00    Total pack years: 50.00    Types: Cigarettes   Smokeless tobacco: Never   Tobacco comments:    Cutting back   Vaping Use   Vaping Use: Former  Substance Use Topics   Alcohol use: Yes    Alcohol/week: 0.0 standard drinks of alcohol    Comment: social   Drug use: Yes    Types: Marijuana    Comment: Last use was on 02/2022    ALLERGIES:  Allergies  Allergen Reactions   Wellbutrin [Bupropion] Other (See Comments)    Possible seizure    Flomax [Tamsulosin] Swelling    Ritalin [Methylphenidate] Other (See Comments)    Unknown reaction - too strong?   Tape Other (See Comments)    "Plastic" tape TEARS THE SKIN; please use paper tape or Coban wrap!!   Trazodone Other (See Comments)    To strong   Neomycin-Bacitracin Zn-Polymyx Rash   Niacin     Flushing    Paroxetine Other (See Comments)    Makes the patient feel SEVERELY SEDATED/LETHARGIC/CANNOT SPEAK WORDS Lethargy (intolerance) REACTION: goofy    MEDICATIONS:  Current Outpatient Medications  Medication Sig Dispense Refill   albuterol (PROVENTIL HFA;VENTOLIN HFA) 108 (90 Base) MCG/ACT inhaler Inhale 2 puffs into the lungs every 6 (six) hours as needed for wheezing or shortness of breath.     alfuzosin (UROXATRAL) 10 MG 24 hr tablet Take 10 mg by mouth at bedtime.  11   amLODipine (NORVASC) 10 MG tablet Take 5 mg by mouth daily.     ammonium lactate (LAC-HYDRIN) 12 % lotion Apply 1 application topically 2 (two) times daily as needed for dry skin.     aspirin EC 81 MG tablet Take 1 tablet (81 mg total) by mouth daily. 90 tablet 3   clopidogrel (PLAVIX) 75 MG tablet Take 1 tablet (75 mg total) by mouth daily. 30 tablet 3   escitalopram (LEXAPRO) 10 MG tablet Take 30 mg by mouth every morning.      ferrous sulfate 325 (65 FE) MG tablet Take 325 mg by mouth 3 (three) times a week. Take on Monday, Wednesday, Friday     gabapentin (NEURONTIN) 300 MG capsule Take 600 mg by mouth 2 (two) times daily.     Lactobacillus (ACIDOPHILUS) CAPS capsule Take 1 capsule by mouth daily.     mirtazapine (REMERON) 15 MG tablet Take 1 tablet (15 mg total) by mouth at bedtime. 30 tablet 2   Multiple Vitamin (MULTI-VITAMIN) tablet Take 1 tablet by mouth daily. theragran     nitroGLYCERIN (NITRODUR - DOSED IN MG/24 HR) 0.2 mg/hr patch Place 1 patch (0.2 mg total) onto the skin daily. (Patient taking differently: Place 0.2 mg onto the skin daily as needed (Apply to foot for blood flow).) 30 patch 12   Omega-3 Fatty Acids (FISH  OIL) 1000 MG CAPS Take 1,000 mg by mouth 2 (two) times daily.      omeprazole (PRILOSEC) 40 MG capsule Take 40 mg by mouth daily.     pravastatin (PRAVACHOL) 40 MG tablet Take 40 mg by mouth at bedtime.  Tiotropium Bromide Monohydrate (SPIRIVA RESPIMAT) 2.5 MCG/ACT AERS Inhale 2 puffs into the lungs daily. 4 g 6   zolpidem (AMBIEN) 10 MG tablet Take 10 mg by mouth at bedtime as needed for sleep.     Current Facility-Administered Medications  Medication Dose Route Frequency Provider Last Rate Last Admin   0.9 %  sodium chloride infusion  500 mL Intravenous Continuous Irene Shipper, MD       sodium chloride flush (NS) 0.9 % injection 3 mL  3 mL Intravenous Q12H Wellington Hampshire, MD        REVIEW OF SYSTEMS:  A 10+ POINT REVIEW OF SYSTEMS WAS OBTAINED including neurology, dermatology, psychiatry, cardiac, respiratory, lymph, extremities, GI, GU, musculoskeletal, constitutional, reproductive, HEENT.  He denies any significant pain within the chest area or hemoptysis.  He has some dyspnea with exertion.  He denies use of supplemental oxygen   PHYSICAL EXAM:  height is 6\' 3"  (1.905 m) and weight is 172 lb (78 kg). His temperature is 97.7 F (36.5 C). His blood pressure is 144/81 (abnormal) and his pulse is 64. His respiration is 20 and oxygen saturation is 98%.   General: Alert and oriented, in no acute distress HEENT: Head is normocephalic. Extraocular movements are intact. Neck: Neck is supple, no palpable cervical or supraclavicular lymphadenopathy. Heart: Regular in rate and rhythm with no murmurs, rubs, or gallops. Chest: Clear to auscultation bilaterally, with no rhonchi, wheezes, or rales. Abdomen: Soft, nontender, nondistended, with no rigidity or guarding. Extremities: No cyanosis or edema. Lymphatics: see Neck Exam Skin: No concerning lesions. Musculoskeletal: symmetric strength and muscle tone throughout. Neurologic: Cranial nerves II through XII are grossly intact. No obvious  focalities. Speech is fluent. Coordination is intact. Psychiatric: Judgment and insight are intact. Affect is appropriate.   ECOG = 1    LABORATORY DATA:  Lab Results  Component Value Date   WBC 11.1 (H) 03/21/2022   HGB 14.4 03/21/2022   HCT 43.3 03/21/2022   MCV 93.1 03/21/2022   PLT 182 03/21/2022   NEUTROABS 8.6 (H) 06/03/2019   Lab Results  Component Value Date   NA 139 03/21/2022   K 3.9 03/21/2022   CL 107 03/21/2022   CO2 22 03/21/2022   GLUCOSE 118 (H) 03/21/2022   BUN 10 03/21/2022   CREATININE 0.90 03/21/2022   CALCIUM 8.9 03/21/2022      RADIOGRAPHY: MR BRAIN W WO CONTRAST  Result Date: 03/27/2022 CLINICAL DATA:  Provided history: Malignant neoplasm of lung, unspecified laterality, unspecified part of lung. Malignant neoplasm of unspecified part of unspecified bronchus or lung. Non-small cell lung cancer, staging; possible metastatic lung cancer, staging. EXAM: MRI HEAD WITHOUT AND WITH CONTRAST TECHNIQUE: Multiplanar, multiecho pulse sequences of the brain and surrounding structures were obtained without and with intravenous contrast. CONTRAST:  7.71mL GADAVIST GADOBUTROL 1 MMOL/ML IV SOLN COMPARISON:  Brain MRI 04/16/2017. FINDINGS: Brain: No age advanced or lobar predominant parenchymal atrophy. Multifocal T2 FLAIR hyperintense signal abnormality within the cerebral white matter and pons, nonspecific but compatible with mild chronic small vessel ischemic disease. Small chronic infarcts within the bilateral cerebellar hemispheres (measuring up to 6 mm) , increased in number from the prior MRI of 04/16/2017. There is no acute infarct. No evidence of an intracranial mass. No chronic intracranial blood products. No extra-axial fluid collection. No midline shift. No pathologic intracranial enhancement identified. Vascular: Maintained flow voids within the proximal large arterial vessels. Left frontoparietal lobe developmental venous anomaly (anatomic variant). Skull and  upper cervical  spine: No focal suspicious marrow lesion. Sinuses/Orbits: No mass or acute finding within the imaged orbits. Prior bilateral ocular lens replacement. Mild mucosal thickening within the right frontal and bilateral ethmoid sinuses. Small mucous retention cyst within the left sphenoid sinus. Trace mucosal thickening within the bilateral maxillary sinuses. IMPRESSION: No evidence of intracranial metastatic disease. Mild chronic small vessel ischemic changes within the cerebral white matter and pons, progressed from the prior MRI of 04/16/2017. Small chronic infarcts within the bilateral cerebellar hemispheres (measuring up to 6 mm), increased in number from the prior MRI. Electronically Signed   By: Kellie Simmering D.O.   On: 03/27/2022 17:53   DG CHEST PORT 1 VIEW  Result Date: 03/21/2022 CLINICAL DATA:  Status post bronchoscopy EXAM: PORTABLE CHEST 1 VIEW COMPARISON:  Previous studies including the CT chest done earlier today FINDINGS: Transverse diameter of heart is within normal limits. There are no signs of alveolar pulmonary edema. There is a fiduciary marker in the medial margin of nodule in right upper lobe. There is another fiduciary marker in left lower lung field, possibly in lingula. Nodule was seen in the same region in the previous CT chest. There is interval appearance of faint alveolar densities around the fiduciary marker suggesting possible intraparenchymal bleeding. Emphysematous changes are noted. Increased interstitial markings in both lungs suggest scarring from chronic interstitial lung disease. There is no pleural effusion or pneumothorax. IMPRESSION: Fiduciary markers are seen in right upper and left lower lung fields in the area of nodules seen in the CT. There is interval appearance of faint alveolar density in left mid and lower lung fields, possibly pulmonary hemorrhage related to the biopsy. There is no significant pleural effusion. There is no pneumothorax. Electronically  Signed   By: Elmer Picker M.D.   On: 03/21/2022 09:58   DG C-ARM BRONCHOSCOPY  Result Date: 03/21/2022 C-ARM BRONCHOSCOPY: Fluoroscopy was utilized by the requesting physician.  No radiographic interpretation.   CT Super D Chest Wo Contrast  Result Date: 03/21/2022 CLINICAL DATA:  72 year old male under evaluation for pre-surgical planning for right upper lobe mass. EXAM: CT CHEST WITHOUT CONTRAST TECHNIQUE: Multidetector CT imaging of the chest was performed using thin slice collimation for electromagnetic bronchoscopy planning purposes, without intravenous contrast. RADIATION DOSE REDUCTION: This exam was performed according to the departmental dose-optimization program which includes automated exposure control, adjustment of the mA and/or kV according to patient size and/or use of iterative reconstruction technique. COMPARISON:  Prior outside PET-CT 02/06/2022 from [facility]. FINDINGS: Cardiovascular: Heart size is normal. There is no significant pericardial fluid, thickening or pericardial calcification. There is aortic atherosclerosis, as well as atherosclerosis of the great vessels of the mediastinum and the coronary arteries, including calcified atherosclerotic plaque in the left main, left anterior descending, left circumflex and right coronary arteries. Mediastinum/Nodes: No pathologically enlarged mediastinal or hilar lymph nodes. Please note that accurate exclusion of hilar adenopathy is limited on noncontrast CT scans. Esophagus is unremarkable in appearance. No axillary lymphadenopathy. Lungs/Pleura: In the periphery of the right upper lobe (axial image 43 of series 4 and sagittal image 44 of series 7) there is a macrolobulated mass measuring 3.6 x 2.6 x 2.0 cm which makes broad contact with the pleura laterally. Several other small pulmonary nodules scattered throughout the lungs bilaterally appear generally stable in number and size compared to prior examinations dating  back to 05/11/2020, with the exception of these in the periphery of the left upper lobe (axial image 96 of series 4) currently  measuring 1.4 x 0.7 cm. This lesion is stable compared to the most recent prior studies, but is new compared to the prior examination from 05/11/2020. No acute consolidative airspace disease. No pleural effusions. Diffuse bronchial wall thickening with moderate to severe centrilobular and paraseptal emphysema. Upper Abdomen: Aortic atherosclerosis. Musculoskeletal: Orthopedic fixation hardware in the lower cervical spine incidentally noted. There are no aggressive appearing lytic or blastic lesions noted in the visualized portions of the skeleton. IMPRESSION: 1. Aggressive appearing macrolobulated mass in the periphery of the right upper lobe concerning for potential neoplasm. Tissue sampling should be considered if clinically appropriate. 2. There are multiple other small pulmonary nodules scattered throughout the lungs bilaterally, generally stable compared to prior examinations from 05/11/2020, with the exception being a macrolobulated nodule in the periphery of the left upper lobe measuring 1.4 x 0.7 cm on today's study (axial image 96 of series 4). 3. No definite lymphadenopathy. 4. Aortic atherosclerosis, in addition to left main and three-vessel coronary artery disease. Please note that although the presence of coronary artery calcium documents the presence of coronary artery disease, the severity of this disease and any potential stenosis cannot be assessed on this non-gated CT examination. Assessment for potential risk factor modification, dietary therapy or pharmacologic therapy may be warranted, if clinically indicated. 5. Diffuse bronchial wall thickening with moderate to severe centrilobular and paraseptal emphysema. Aortic Atherosclerosis (ICD10-I70.0) and Emphysema (ICD10-J43.9). Electronically Signed   By: Vinnie Langton M.D.   On: 03/21/2022 08:11      IMPRESSION:  Bilateral lung cancers: Adenocarcinoma of the right upper lobe and left lingula    Patient was seen by Dr. Alroy Dust and was felt to be a candidate for surgery concerning his right lung lesion.  The patient did undergo an additional PET scan which documented a third lesion in the right lower lobe.  This report and images are not available for review but in reviewing the patient's chest CT scan earlier this month in our system,  this lesion is visible.  The Patient and his daughter wish that all of his care be in one location and hence has been referred to [facility].  The patient is obtaining referral from the [place] to be seen by either Dr. Kipp Brood or Dr. Roxan Hockey in the near future concerning surgery  Today I discussed the general course of SBRT which could be performed  concerning the lesion in the left lung.  I also discussed that if he is not a candidate for surgery and we could treat his right upper lobe and right lower lobe with radiation therapy in addition.   PLAN: Final treatment plans pending input from cardiothoracic surgery.   60 minutes of total time was spent for this patient encounter, including preparation, face-to-face counseling with the patient and coordination of care, physical exam, and documentation of the encounter.   ------------------------------------------------  Blair Promise, PhD, MD  This document serves as a record of services personally performed by Gery Pray, MD. It was created on his behalf by Roney Mans, a trained medical scribe. The creation of this record is based on the scribe's personal observations and the provider's statements to them. This document has been checked and approved by the attending provider.

## 2022-04-15 ENCOUNTER — Ambulatory Visit
Admission: RE | Admit: 2022-04-15 | Discharge: 2022-04-15 | Disposition: A | Discharge status: Home / Self Care | Payer: No Typology Code available for payment source | Source: Ambulatory Visit | Attending: Radiation Oncology | Admitting: Radiation Oncology

## 2022-04-15 ENCOUNTER — Other Ambulatory Visit: Payer: Self-pay

## 2022-04-15 ENCOUNTER — Encounter: Payer: Self-pay | Admitting: Radiation Oncology

## 2022-04-15 ENCOUNTER — Telehealth: Payer: Self-pay | Admitting: Radiation Oncology

## 2022-04-15 VITALS — BP 144/81 | HR 64 | Temp 97.7°F | Resp 20 | Ht 75.0 in | Wt 172.0 lb

## 2022-04-15 DIAGNOSIS — I739 Peripheral vascular disease, unspecified: Secondary | ICD-10-CM | POA: Insufficient documentation

## 2022-04-15 DIAGNOSIS — R918 Other nonspecific abnormal finding of lung field: Secondary | ICD-10-CM

## 2022-04-15 DIAGNOSIS — K219 Gastro-esophageal reflux disease without esophagitis: Secondary | ICD-10-CM | POA: Diagnosis not present

## 2022-04-15 DIAGNOSIS — F17210 Nicotine dependence, cigarettes, uncomplicated: Secondary | ICD-10-CM | POA: Insufficient documentation

## 2022-04-15 DIAGNOSIS — J432 Centrilobular emphysema: Secondary | ICD-10-CM | POA: Diagnosis not present

## 2022-04-15 DIAGNOSIS — Z7982 Long term (current) use of aspirin: Secondary | ICD-10-CM | POA: Diagnosis not present

## 2022-04-15 DIAGNOSIS — I700 Atherosclerosis of aorta: Secondary | ICD-10-CM | POA: Insufficient documentation

## 2022-04-15 DIAGNOSIS — Z8719 Personal history of other diseases of the digestive system: Secondary | ICD-10-CM | POA: Insufficient documentation

## 2022-04-15 DIAGNOSIS — Z7902 Long term (current) use of antithrombotics/antiplatelets: Secondary | ICD-10-CM | POA: Diagnosis not present

## 2022-04-15 DIAGNOSIS — Z79899 Other long term (current) drug therapy: Secondary | ICD-10-CM | POA: Diagnosis not present

## 2022-04-15 DIAGNOSIS — Z800 Family history of malignant neoplasm of digestive organs: Secondary | ICD-10-CM | POA: Insufficient documentation

## 2022-04-15 DIAGNOSIS — R911 Solitary pulmonary nodule: Secondary | ICD-10-CM

## 2022-04-15 NOTE — Telephone Encounter (Signed)
1/29 @ 1:15 pm Dr. Clabe Seal nurse Baxter Flattery requested copy of PET scan done at [facility] in [place] on 1/18.  Reach out to [facility] - Release of Information.  Will fax and send cd of images -per Tom/waiting on imaging report.

## 2022-04-17 ENCOUNTER — Other Ambulatory Visit: Payer: Self-pay | Admitting: Radiation Oncology

## 2022-04-17 ENCOUNTER — Ambulatory Visit
Admission: RE | Admit: 2022-04-17 | Discharge: 2022-04-17 | Disposition: A | Discharge status: Home / Self Care | Payer: Self-pay | Source: Ambulatory Visit | Attending: Radiation Oncology | Admitting: Radiation Oncology

## 2022-04-17 DIAGNOSIS — R911 Solitary pulmonary nodule: Secondary | ICD-10-CM

## 2022-04-25 ENCOUNTER — Encounter: Payer: Self-pay | Admitting: Thoracic Surgery (Cardiothoracic Vascular Surgery)

## 2022-04-25 ENCOUNTER — Institutional Professional Consult (permissible substitution) (INDEPENDENT_AMBULATORY_CARE_PROVIDER_SITE_OTHER): Payer: No Typology Code available for payment source | Admitting: Thoracic Surgery (Cardiothoracic Vascular Surgery)

## 2022-04-25 VITALS — BP 144/81 | HR 61 | Resp 20 | Ht 75.0 in | Wt 172.0 lb

## 2022-04-25 DIAGNOSIS — R918 Other nonspecific abnormal finding of lung field: Secondary | ICD-10-CM | POA: Diagnosis not present

## 2022-04-25 NOTE — Progress Notes (Signed)
[address]       [place] [address]             [phone]                    Taesean P Addison Record #[id number] Date of Birth: [birth year]  Referring: [facility]: [name], [name], MD Primary Cardiologist: Kathlyn Sacramento, MD  Chief Complaint:    Chief Complaint  Patient presents with   Lung Cancer    New patient consultation, Bronch 1/4, chest CT 1/4, PET 1/31, Brain MRI 1/10    History of Present Illness:    David David 72 y.o. male presents for surgical evaluation of biopsy-proven non-small cell lung cancer of the right upper and left upper lobes.  Patient previously been worked up by pulmonologist.  He does have history of COPD and recently has had worsening dyspnea on exertion.  He has already met with radiation oncology to discuss the potential SBRT.    Smoking Hx: He does continue to smoke but is actively trying to quit.   Zubrod Score: At the time of surgery this patient's most appropriate activity status/level should be described as: []     0    Normal activity, no symptoms [x]     1    Restricted in physical strenuous activity but ambulatory, able to do out light work []     2    Ambulatory and capable of self care, unable to do work activities, up and about               >50 % of waking hours                              []     3    Only limited self care, in bed greater than 50% of waking hours []     4    Completely disabled, no self care, confined to bed or chair []     5    Moribund   Past Medical History:  Diagnosis Date   Alcoholism in family    Anxiety    Arthritis    Depressive disorder, not elsewhere classified    Diverticulosis of colon (without mention of hemorrhage)    Dysfunction of eustachian tube    Exposure to STD    Family history of diabetes mellitus    Family history of psychiatric condition    family Hx of suicide    GERD (gastroesophageal reflux disease)    Impotence of  organic origin    erectile dysfunction   Lipoprotein deficiencies    Other diseases of lung, not elsewhere classified    pulmonary nodule   Peripheral vascular disease, unspecified ([place])    lower extremity peripheral artery disease   Personal history of colonic polyps    Routine general medical examination at a health care facility    Seizures ([place])    Sprain of neck    Hx   Thyroid disease    Tobacco use disorder    Unspecified hypertensive kidney disease with chronic kidney disease stage I through stage IV, or unspecified(403.90)    cholesterol embolization syndrome    Past Surgical History:  Procedure Laterality Date   ABDOMINAL AORTOGRAM W/LOWER EXTREMITY N/A 11/07/2021   Procedure: ABDOMINAL AORTOGRAM W/LOWER EXTREMITY;  Surgeon: Wellington Hampshire, MD;  Location: [facility];  Service:  Cardiovascular;  Laterality: N/A;   angiography     bilateral lower extremity angiography, stenting of the left.    BRONCHIAL BIOPSY  03/21/2022   Procedure: BRONCHIAL BIOPSIES;  Surgeon: Maryjane Hurter, MD;  Location: [facility];  Service: Pulmonary;;   BRONCHIAL BRUSHINGS  03/21/2022   Procedure: BRONCHIAL BRUSHINGS;  Surgeon: Maryjane Hurter, MD;  Location: [facility];  Service: Pulmonary;;   BRONCHIAL NEEDLE ASPIRATION BIOPSY  03/21/2022   Procedure: BRONCHIAL NEEDLE ASPIRATION BIOPSIES;  Surgeon: Maryjane Hurter, MD;  Location: [facility];  Service: Pulmonary;;   BRONCHIAL WASHINGS  03/21/2022   Procedure: BRONCHIAL WASHINGS;  Surgeon: Maryjane Hurter, MD;  Location: [name];  Service: Pulmonary;;   CARDIAC CATHETERIZATION N/A 07/19/2014   Procedure: Left Heart Cath and Coronary Angiography;  Surgeon: Leonie Man, MD;  Location: [facility] CUPID;  Service: Cardiovascular;  Laterality: N/A;   CATARACT EXTRACTION Bilateral    EYE SURGERY  2016   blown macular   FIDUCIAL MARKER PLACEMENT  03/21/2022   Procedure: FIDUCIAL MARKER PLACEMENT;  Surgeon: Maryjane Hurter, MD;  Location: [facility] ENDOSCOPY;  Service: Pulmonary;;   KNEE ARTHROSCOPY Left    x 2   LOWER EXTREMITY ANGIOGRAM N/A 11/04/2012   Procedure: LOWER EXTREMITY ANGIOGRAM;  Surgeon: Wellington Hampshire, MD;  Location: [facility] CATH LAB;  Service: Cardiovascular;  Laterality: N/A;   POPLITEAL ARTERY ANGIOPLASTY     didnt specify angioplasty or stent   SPERMATOCELECTOMY Left    testicular cyst   VASECTOMY      Family History  Problem Relation Age of Onset   Congestive Heart Failure Mother    CAD Father    Alzheimer's disease Sister    Lung cancer Brother        smoking hx   Alzheimer's disease Brother    Diabetes Brother    Colon cancer Neg Hx    Stomach cancer Neg Hx    Esophageal cancer Neg Hx    Colon polyps Neg Hx      Social History   Tobacco Use  Smoking Status Every Day   Packs/day: 1.00   Years: 50.00   Total pack years: 50.00   Types: Cigarettes  Smokeless Tobacco Never  Tobacco Comments   Cutting back     Social History   Substance and Sexual Activity  Alcohol Use Yes   Alcohol/week: 0.0 standard drinks of alcohol   Comment: social     Allergies  Allergen Reactions   Wellbutrin [Bupropion] Other (See Comments)    Possible seizure    Flomax [Tamsulosin] Swelling   Ritalin [Methylphenidate] Other (See Comments)    Unknown reaction - too strong?   Tape Other (See Comments)    "Plastic" tape TEARS THE SKIN; please use paper tape or Coban wrap!!   Trazodone Other (See Comments)    To strong   Neomycin-Bacitracin Zn-Polymyx Rash   Niacin     Flushing    Paroxetine Other (See Comments)    Makes the patient feel SEVERELY SEDATED/LETHARGIC/CANNOT SPEAK WORDS Lethargy (intolerance) REACTION: goofy    Current Outpatient Medications  Medication Sig Dispense Refill   albuterol (PROVENTIL HFA;VENTOLIN HFA) 108 (90 Base) MCG/ACT inhaler Inhale 2 puffs into the lungs every 6 (six) hours as needed for wheezing or shortness of breath.     alfuzosin  (UROXATRAL) 10 MG 24 hr tablet Take 10 mg by mouth at bedtime.  11   amLODipine (NORVASC) 10 MG tablet Take 5 mg by mouth  daily.     ammonium lactate (LAC-HYDRIN) 12 % lotion Apply 1 application topically 2 (two) times daily as needed for dry skin.     aspirin EC 81 MG tablet Take 1 tablet (81 mg total) by mouth daily. 90 tablet 3   clopidogrel (PLAVIX) 75 MG tablet Take 1 tablet (75 mg total) by mouth daily. 30 tablet 3   escitalopram (LEXAPRO) 10 MG tablet Take 30 mg by mouth every morning.      ferrous sulfate 325 (65 FE) MG tablet Take 325 mg by mouth 3 (three) times a week. Take on Monday, Wednesday, Friday     gabapentin (NEURONTIN) 300 MG capsule Take 600 mg by mouth 2 (two) times daily.     Lactobacillus (ACIDOPHILUS) CAPS capsule Take 1 capsule by mouth daily.     mirtazapine (REMERON) 15 MG tablet Take 1 tablet (15 mg total) by mouth at bedtime. 30 tablet 2   Multiple Vitamin (MULTI-VITAMIN) tablet Take 1 tablet by mouth daily. theragran     nitroGLYCERIN (NITRODUR - DOSED IN MG/24 HR) 0.2 mg/hr patch Place 1 patch (0.2 mg total) onto the skin daily. (Patient taking differently: Place 0.2 mg onto the skin daily as needed (Apply to foot for blood flow).) 30 patch 12   Omega-3 Fatty Acids (FISH OIL) 1000 MG CAPS Take 1,000 mg by mouth 2 (two) times daily.      omeprazole (PRILOSEC) 40 MG capsule Take 40 mg by mouth daily.     pravastatin (PRAVACHOL) 40 MG tablet Take 40 mg by mouth at bedtime.     Tiotropium Bromide Monohydrate (SPIRIVA RESPIMAT) 2.5 MCG/ACT AERS Inhale 2 puffs into the lungs daily. 4 g 6   zolpidem (AMBIEN) 10 MG tablet Take 10 mg by mouth at bedtime as needed for sleep.     Current Facility-Administered Medications  Medication Dose Route Frequency Provider Last Rate Last Admin   0.9 %  sodium chloride infusion  500 mL Intravenous Continuous Irene Shipper, MD       sodium chloride flush (NS) 0.9 % injection 3 mL  3 mL Intravenous Q12H Wellington Hampshire, MD         Review of Systems  Constitutional:  Positive for malaise/fatigue.  Respiratory:  Positive for cough and shortness of breath.   Cardiovascular:  Negative for chest pain.  Neurological: Negative.      PHYSICAL EXAMINATION: BP (!) 144/81 (BP Location: Left Arm, Patient Position: Sitting, Cuff Size: Normal)   Pulse 61   Resp 20   Ht 6\' 3"  (1.905 m)   Wt 172 lb (78 kg)   SpO2 97% Comment: RA  BMI 21.50 kg/m  Physical Exam Constitutional:      General: He is not in acute distress.    Appearance: Normal appearance. He is not ill-appearing.  HENT:     Head: Normocephalic and atraumatic.  Cardiovascular:     Rate and Rhythm: Normal rate.  Pulmonary:     Effort: Pulmonary effort is normal. No respiratory distress.  Musculoskeletal:        General: Normal range of motion.     Cervical back: Normal range of motion.  Skin:    General: Skin is warm and dry.  Neurological:     General: No focal deficit present.     Mental Status: He is alert and oriented to person, place, and time.     Diagnostic Studies & Laboratory data:     Recent Radiology Findings:   MR BRAIN W  WO CONTRAST  Result Date: 03/27/2022 CLINICAL DATA:  Provided history: Malignant neoplasm of lung, unspecified laterality, unspecified part of lung. Malignant neoplasm of unspecified part of unspecified bronchus or lung. Non-small cell lung cancer, staging; possible metastatic lung cancer, staging. EXAM: MRI HEAD WITHOUT AND WITH CONTRAST TECHNIQUE: Multiplanar, multiecho pulse sequences of the brain and surrounding structures were obtained without and with intravenous contrast. CONTRAST:  7.52mL GADAVIST GADOBUTROL 1 MMOL/ML IV SOLN COMPARISON:  Brain MRI 04/16/2017. FINDINGS: Brain: No age advanced or lobar predominant parenchymal atrophy. Multifocal T2 FLAIR hyperintense signal abnormality within the cerebral white matter and pons, nonspecific but compatible with mild chronic small vessel ischemic disease. Small  chronic infarcts within the bilateral cerebellar hemispheres (measuring up to 6 mm) , increased in number from the prior MRI of 04/16/2017. There is no acute infarct. No evidence of an intracranial mass. No chronic intracranial blood products. No extra-axial fluid collection. No midline shift. No pathologic intracranial enhancement identified. Vascular: Maintained flow voids within the proximal large arterial vessels. Left frontoparietal lobe developmental venous anomaly (anatomic variant). Skull and upper cervical spine: No focal suspicious marrow lesion. Sinuses/Orbits: No mass or acute finding within the imaged orbits. Prior bilateral ocular lens replacement. Mild mucosal thickening within the right frontal and bilateral ethmoid sinuses. Small mucous retention cyst within the left sphenoid sinus. Trace mucosal thickening within the bilateral maxillary sinuses. IMPRESSION: No evidence of intracranial metastatic disease. Mild chronic small vessel ischemic changes within the cerebral white matter and pons, progressed from the prior MRI of 04/16/2017. Small chronic infarcts within the bilateral cerebellar hemispheres (measuring up to 6 mm), increased in number from the prior MRI. Electronically Signed   By: Kellie Simmering D.O.   On: 03/27/2022 17:53       I have independently reviewed the above radiology studies  and reviewed the findings with the patient.   Recent Lab Findings: Lab Results  Component Value Date   WBC 11.1 (H) 03/21/2022   HGB 14.4 03/21/2022   HCT 43.3 03/21/2022   PLT 182 03/21/2022   GLUCOSE 118 (H) 03/21/2022   CHOL 126 04/17/2017   TRIG 150 (H) 04/17/2017   HDL 36 (L) 04/17/2017   LDLCALC 60 04/17/2017   ALT 26 04/16/2017   AST 31 04/16/2017   NA 139 03/21/2022   K 3.9 03/21/2022   CL 107 03/21/2022   CREATININE 0.90 03/21/2022   BUN 10 03/21/2022   CO2 22 03/21/2022   TSH 2.95 09/16/2017   INR 0.97 04/16/2017   HGBA1C 6.1 (H) 04/17/2017     PFTs:  - FVC: 87% -  FEV1: 80% -DLCO: 46%    Assessment / Plan:   72 year old male with non-small cell lung cancer in his right upper lobe and left upper lobe.  On the right upper lobe lesion measures 3.6 cm, and the left upper lobe lesion measures 1.4 cm.  He also has scattered pulmonary nodules bilaterally.  Of concern is his marginal function moderate to severe COPD.  I explained that if we were to proceed with a right upper lobectomy, there is a very high probability that he will require lifelong oxygen.  Additionally given the fact that this operation would not be curative, we may want to consider she treating both lungs with SBRT.  He is in agreement with the plan.  I will discuss things with radiation oncology.  I  spent 20 minutes with  the patient face to face in counseling and coordination of care.    Tera Mater  O Eulogio Requena 04/25/2022 1:18 PM

## 2022-05-02 ENCOUNTER — Telehealth: Payer: Self-pay

## 2022-05-02 NOTE — Telephone Encounter (Signed)
Received call from patients daughter Joelene Millin, Per Joelene Millin patient was seen by Dr. Kipp Brood last week and was told that he was not a candidate for surgery. Patient is ready to come in to see Dr. Sondra Come to start radiation treatments. Pls advise

## 2022-05-14 ENCOUNTER — Encounter: Payer: Self-pay | Admitting: Radiation Oncology

## 2022-05-14 ENCOUNTER — Ambulatory Visit
Admission: RE | Admit: 2022-05-14 | Discharge: 2022-05-14 | Disposition: A | Discharge status: Home / Self Care | Payer: Non-veteran care | Source: Ambulatory Visit | Attending: Radiation Oncology | Admitting: Radiation Oncology

## 2022-05-14 ENCOUNTER — Ambulatory Visit
Admission: RE | Admit: 2022-05-14 | Discharge: 2022-05-14 | Disposition: A | Discharge status: Home / Self Care | Payer: No Typology Code available for payment source | Source: Ambulatory Visit | Attending: Radiation Oncology | Admitting: Radiation Oncology

## 2022-05-14 ENCOUNTER — Ambulatory Visit: Payer: Non-veteran care | Attending: Cardiovascular Disease | Admitting: Cardiovascular Disease

## 2022-05-14 ENCOUNTER — Ambulatory Visit
Admission: RE | Admit: 2022-05-14 | Discharge: 2022-05-14 | Disposition: A | Discharge status: Home / Self Care | Payer: No Typology Code available for payment source | Source: Ambulatory Visit | Admitting: Radiation Oncology

## 2022-05-14 ENCOUNTER — Other Ambulatory Visit: Payer: Self-pay

## 2022-05-14 ENCOUNTER — Encounter: Payer: Self-pay | Admitting: Cardiovascular Disease

## 2022-05-14 ENCOUNTER — Inpatient Hospital Stay: Admission: RE | Admit: 2022-05-14 | Payer: Non-veteran care | Source: Ambulatory Visit

## 2022-05-14 ENCOUNTER — Ambulatory Visit
Admission: RE | Admit: 2022-05-14 | Payer: No Typology Code available for payment source | Source: Ambulatory Visit | Admitting: Radiation Oncology

## 2022-05-14 ENCOUNTER — Ambulatory Visit: Admission: RE | Admit: 2022-05-14 | Payer: Non-veteran care | Source: Ambulatory Visit

## 2022-05-14 VITALS — BP 130/70 | HR 61 | Ht 75.0 in | Wt 175.0 lb

## 2022-05-14 DIAGNOSIS — F17210 Nicotine dependence, cigarettes, uncomplicated: Secondary | ICD-10-CM | POA: Diagnosis not present

## 2022-05-14 DIAGNOSIS — C3411 Malignant neoplasm of upper lobe, right bronchus or lung: Secondary | ICD-10-CM | POA: Diagnosis present

## 2022-05-14 DIAGNOSIS — Z720 Tobacco use: Secondary | ICD-10-CM | POA: Diagnosis not present

## 2022-05-14 DIAGNOSIS — E785 Hyperlipidemia, unspecified: Secondary | ICD-10-CM

## 2022-05-14 DIAGNOSIS — R918 Other nonspecific abnormal finding of lung field: Secondary | ICD-10-CM

## 2022-05-14 DIAGNOSIS — I10 Essential (primary) hypertension: Secondary | ICD-10-CM | POA: Diagnosis not present

## 2022-05-14 DIAGNOSIS — Z510 Encounter for antineoplastic radiation therapy: Secondary | ICD-10-CM | POA: Insufficient documentation

## 2022-05-14 DIAGNOSIS — C3492 Malignant neoplasm of unspecified part of left bronchus or lung: Secondary | ICD-10-CM | POA: Diagnosis present

## 2022-05-14 DIAGNOSIS — I739 Peripheral vascular disease, unspecified: Secondary | ICD-10-CM | POA: Diagnosis not present

## 2022-05-14 NOTE — Progress Notes (Signed)
Radiation Oncology         [phone]) [phone] ________________________________  Follow-up New Visit   Outpatient   Name: David David MRN: [id number]  Date: 05/14/2022  DOB: [birth year]  [id number], Malka So, MD  [name], [name], MD   REFERRING PHYSICIAN: [name], [name], MD  DIAGNOSIS: Bilateral lung cancers: Adenocarcinoma of the right upper lobe and left lingula    HISTORY OF PRESENT ILLNESS::David David is a 72 y.o. male who is accompanied by his wife. he returns today to further discuss the role of radiation therapy as part of management for his recently diagnosed bilateral lung cancers. The patient was last seen here for his initial consultation on 04/15/22. Since that time, the patient accordingly met with Dr. Kipp Brood on 04/25/22 for surgical consultation. Dr. Kipp Brood explained to the patient that if he were to proceed with a right upper lobectomy, there is a very high probability that he would require lifelong oxygen due to his marginal lung function from COPD. Additionally, given the fact that this operation would not be curative in nature, Dr. Lowella Dell may want the patient to be considered for SBRT to both lungs.   HPI Initial Consultation 04/15/22 ::David David is a 72 y.o. male current smoker. Today, he accompanied by his daughter. he is seen as a courtesy of Dr. Alroy Dust for an opinion concerning radiation therapy as part of management for his recently diagnosed bilateral lung cancers.    The patient initially presented to his GI this past August with the cc of a decrease in appetite with a 20 pound weight loss. His symptoms prompted a CT of the abdomen and pelvis on 11/26/21 which revealed multiple pulmonary nodules in the right lung, and scarring in the peripheral bilateral lower lobes. No acute abnormalities were identified in the abdomen or pelvis.     The patient was then referred to Dr. Merrie Roof, [facility] medical oncology, on 01/31/22, who recommended proceeding with a chest CT  to better evaluate the pulmonary nodules.    Chest CT without contrast on 02/04/22 further demonstrated: an increase in size of one of the dominant RUL nodules, measuring 3.1 cm in the greatest extent; a stable left lower nodule measuring 11 mm; stability of various other nodules in both lungs; a new area of thickening along the margin of a cystic structure in the RUL; and a new area of airspace opacity in the LLL which appeared to be likely infectious/inflammatory in etiology.     PET scan performed on 02/06/22 at the [facility] showed hypermetabolic activity within an enlarging peripheral lingular nodule measuring 1.2 cm, and mild hypermetabolic activity in the known enlarging peripheral right upper lobe mass concerning for bronchogenic carcinoma. PET otherwise showed no evidence of hypermetabolic mediastinal or hilar lymphadenopathy, or evidence of metastatic disease in the neck, abdomen, or pelvis.    The patient was accordingly referred to Dr. Verlee Monte [facility]) on 03/14/22 for further evaluation and management. During this visit, the patient endorsed a worsening cough over the past year with clear sputum production. He denied any other respiratory symptoms.    For bronchoscopy planning, Dr. Verlee Monte ordered a super D chest CT, performed on 03/21/22, which demonstrated: an aggressive appearing macrolobulated mass in the periphery of the RUL concerning for potential neoplasm, and relative stability of multiple other small pulmonary nodules scattered throughout the lungs bilaterally.  CT otherwise showed no definite evidence of lymphadenopathy.    The patient opted to proceed with bronchoscopy for biopsies on 03/21/21. Biopsies  of the RUL and left lingula showed findings consistent with adenocarcinoma. Lavage of the left lingula also revealed findings consistent with adenocarcinoma.    PFT performed during his most recent follow-up visit with Dr. Verlee Monte on 04/09/22 noted mild obstruction, and moderately  reduced diffusing capacity.     In terms or radiation therapy, Dr. Verlee Monte as referred the patient to me for discussion of SBRT to the left lingula primary. We will also review the role of radiation to the RUL primary in the event that he does not qualify for surgical resection. The patient will continue to follow with Dr. Merrie Roof at [facility] medical oncology.    Other pertinent imaging performed thus far includes an MRI of brain with and without contrast on 03/27/22 which shows no evidence of intracranial metastatic disease.    Of note: the patient also had a lesion on his right temple biopsied on 02/18/22 which showed melanoma in situ.  He is scheduled to have additional surgery concerning this lesion in the next few days.  PREVIOUS RADIATION THERAPY: No  PAST MEDICAL HISTORY:  Past Medical History:  Diagnosis Date   Alcoholism in family    Anxiety    Arthritis    Depressive disorder, not elsewhere classified    Diverticulosis of colon (without mention of hemorrhage)    Dysfunction of eustachian tube    Exposure to STD    Family history of diabetes mellitus    Family history of psychiatric condition    family Hx of suicide    GERD (gastroesophageal reflux disease)    Impotence of organic origin    erectile dysfunction   Lipoprotein deficiencies    Other diseases of lung, not elsewhere classified    pulmonary nodule   Peripheral vascular disease, unspecified ([place])    lower extremity peripheral artery disease   Personal history of colonic polyps    Routine general medical examination at a health care facility    Seizures ([place])    Sprain of neck    Hx   Thyroid disease    Tobacco use disorder    Unspecified hypertensive kidney disease with chronic kidney disease stage I through stage IV, or unspecified(403.90)    cholesterol embolization syndrome    PAST SURGICAL HISTORY: Past Surgical History:  Procedure Laterality Date   ABDOMINAL AORTOGRAM W/LOWER EXTREMITY N/A 11/07/2021    Procedure: ABDOMINAL AORTOGRAM W/LOWER EXTREMITY;  Surgeon: Wellington Hampshire, MD;  Location: [facility];  Service: Cardiovascular;  Laterality: N/A;   angiography     bilateral lower extremity angiography, stenting of the left.    BRONCHIAL BIOPSY  03/21/2022   Procedure: BRONCHIAL BIOPSIES;  Surgeon: Maryjane Hurter, MD;  Location: [place];  Service: Pulmonary;;   BRONCHIAL BRUSHINGS  03/21/2022   Procedure: BRONCHIAL BRUSHINGS;  Surgeon: Maryjane Hurter, MD;  Location: [place];  Service: Pulmonary;;   BRONCHIAL NEEDLE ASPIRATION BIOPSY  03/21/2022   Procedure: BRONCHIAL NEEDLE ASPIRATION BIOPSIES;  Surgeon: Maryjane Hurter, MD;  Location: [place];  Service: Pulmonary;;   BRONCHIAL WASHINGS  03/21/2022   Procedure: BRONCHIAL WASHINGS;  Surgeon: Maryjane Hurter, MD;  Location: [facility] ENDOSCOPY;  Service: Pulmonary;;   CARDIAC CATHETERIZATION N/A 07/19/2014   Procedure: Left Heart Cath and Coronary Angiography;  Surgeon: Leonie Man, MD;  Location: [facility] INVASIVE CV LAB CUPID;  Service: Cardiovascular;  Laterality: N/A;   CATARACT EXTRACTION Bilateral    EYE SURGERY  2016   blown macular   FIDUCIAL MARKER PLACEMENT  03/21/2022  Procedure: FIDUCIAL MARKER PLACEMENT;  Surgeon: Maryjane Hurter, MD;  Location: [facility] ENDOSCOPY;  Service: Pulmonary;;   KNEE ARTHROSCOPY Left    x 2   LOWER EXTREMITY ANGIOGRAM N/A 11/04/2012   Procedure: LOWER EXTREMITY ANGIOGRAM;  Surgeon: Wellington Hampshire, MD;  Location: [place] CATH LAB;  Service: Cardiovascular;  Laterality: N/A;   POPLITEAL ARTERY ANGIOPLASTY     didnt specify angioplasty or stent   SPERMATOCELECTOMY Left    testicular cyst   VASECTOMY      FAMILY HISTORY:  Family History  Problem Relation Age of Onset   Congestive Heart Failure Mother    CAD Father    Alzheimer's disease Sister    Lung cancer Brother        smoking hx   Alzheimer's disease Brother    Diabetes Brother    Colon cancer Neg Hx    Stomach cancer Neg Hx     Esophageal cancer Neg Hx    Colon polyps Neg Hx     SOCIAL HISTORY:  Social History   Tobacco Use   Smoking status: Every Day    Packs/day: 1.00    Years: 50.00    Total pack years: 50.00    Types: Cigarettes   Smokeless tobacco: Never   Tobacco comments:    Cutting back   Vaping Use   Vaping Use: Former  Substance Use Topics   Alcohol use: Yes    Alcohol/week: 0.0 standard drinks of alcohol    Comment: social   Drug use: Yes    Types: Marijuana    Comment: Last use was on 02/2022    ALLERGIES:  Allergies  Allergen Reactions   Wellbutrin [Bupropion] Other (See Comments)    Possible seizure    Flomax [Tamsulosin] Swelling   Ritalin [Methylphenidate] Other (See Comments)    Unknown reaction - too strong?   Tape Other (See Comments)    "Plastic" tape TEARS THE SKIN; please use paper tape or Coban wrap!!   Trazodone Other (See Comments)    To strong   Neomycin-Bacitracin Zn-Polymyx Rash   Niacin     Flushing    Paroxetine Other (See Comments)    Makes the patient feel SEVERELY SEDATED/LETHARGIC/CANNOT SPEAK WORDS Lethargy (intolerance) REACTION: goofy    MEDICATIONS:  Current Outpatient Medications  Medication Sig Dispense Refill   albuterol (PROVENTIL HFA;VENTOLIN HFA) 108 (90 Base) MCG/ACT inhaler Inhale 2 puffs into the lungs every 6 (six) hours as needed for wheezing or shortness of breath.     alfuzosin (UROXATRAL) 10 MG 24 hr tablet Take 10 mg by mouth at bedtime.  11   amLODipine (NORVASC) 10 MG tablet Take 5 mg by mouth daily.     ammonium lactate (LAC-HYDRIN) 12 % lotion Apply 1 application topically 2 (two) times daily as needed for dry skin.     aspirin EC 81 MG tablet Take 1 tablet (81 mg total) by mouth daily. 90 tablet 3   clopidogrel (PLAVIX) 75 MG tablet Take 1 tablet (75 mg total) by mouth daily. 30 tablet 3   escitalopram (LEXAPRO) 10 MG tablet Take 30 mg by mouth every morning.      ferrous sulfate 325 (65 FE) MG tablet Take 325 mg by mouth  3 (three) times a week. Take on Monday, Wednesday, Friday     gabapentin (NEURONTIN) 300 MG capsule Take 600 mg by mouth 2 (two) times daily.     Lactobacillus (ACIDOPHILUS) CAPS capsule Take 1 capsule by mouth daily.  mirtazapine (REMERON) 15 MG tablet Take 1 tablet (15 mg total) by mouth at bedtime. 30 tablet 2   Multiple Vitamin (MULTI-VITAMIN) tablet Take 1 tablet by mouth daily. theragran     nitroGLYCERIN (NITRODUR - DOSED IN MG/24 HR) 0.2 mg/hr patch Place 1 patch (0.2 mg total) onto the skin daily. (Patient taking differently: Place 0.2 mg onto the skin daily as needed (Apply to foot for blood flow).) 30 patch 12   Omega-3 Fatty Acids (FISH OIL) 1000 MG CAPS Take 1,000 mg by mouth 2 (two) times daily.      omeprazole (PRILOSEC) 40 MG capsule Take 40 mg by mouth daily.     pravastatin (PRAVACHOL) 40 MG tablet Take 40 mg by mouth at bedtime.     Tiotropium Bromide Monohydrate (SPIRIVA RESPIMAT) 2.5 MCG/ACT AERS Inhale 2 puffs into the lungs daily. 4 g 6   zolpidem (AMBIEN) 10 MG tablet Take 10 mg by mouth at bedtime as needed for sleep.     Current Facility-Administered Medications  Medication Dose Route Frequency Provider Last Rate Last Admin   0.9 %  sodium chloride infusion  500 mL Intravenous Continuous Irene Shipper, MD       sodium chloride flush (NS) 0.9 % injection 3 mL  3 mL Intravenous Q12H Wellington Hampshire, MD        REVIEW OF SYSTEMS:  A 10+ POINT REVIEW OF SYSTEMS WAS OBTAINED including neurology, dermatology, psychiatry, cardiac, respiratory, lymph, extremities, GI, GU, musculoskeletal, constitutional, reproductive, HEENT.  He denies any chills or fever or significant cough or pain within the chest area   PHYSICAL EXAM:  height is '6\' 3"'$  (1.905 m) and weight is 175 lb 12.8 oz (79.7 kg). His temperature is 96.5 F (35.8 C) (abnormal). His blood pressure is 139/68 and his pulse is 60. His respiration is 18 and oxygen saturation is 100%.   General: Alert and oriented, in no  acute distress HEENT: Head is normocephalic. Extraocular movements are intact.  Neck: Neck is supple, no palpable cervical or supraclavicular lymphadenopathy. Heart: Regular in rate and rhythm with no murmurs, rubs, or gallops. Chest: Clear to auscultation bilaterally, with no rhonchi, wheezes, or rales.   ECOG = 1    LABORATORY DATA:  Lab Results  Component Value Date   WBC 11.1 (H) 03/21/2022   HGB 14.4 03/21/2022   HCT 43.3 03/21/2022   MCV 93.1 03/21/2022   PLT 182 03/21/2022   NEUTROABS 8.6 (H) 06/03/2019   Lab Results  Component Value Date   NA 139 03/21/2022   K 3.9 03/21/2022   CL 107 03/21/2022   CO2 22 03/21/2022   GLUCOSE 118 (H) 03/21/2022   BUN 10 03/21/2022   CREATININE 0.90 03/21/2022   CALCIUM 8.9 03/21/2022      RADIOGRAPHY: No results found.    IMPRESSION: Bilateral lung cancers: Adenocarcinoma of the right upper lobe and left lingula     He would be a good candidate for definitive course of radiation therapy directed at his bilateral lung cancers.  He would be a good candidate for SBRT for his left lung lesion.  He would likely be a candidate for SBRT for his right upper lobe lesion depending on dose volume constraints concerning the chest wall area.  Today, I talked to the patient and his wife about the findings and work-up thus far.  We discussed the natural history of non-small cell lung cancer and general treatment, highlighting the role of radiotherapy in the management.  We discussed  the available radiation techniques, and focused on the details of logistics and delivery.  We reviewed the anticipated acute and late sequelae associated with radiation in this setting.  The patient was encouraged to ask questions that I answered to the best of my ability.  A patient consent form was discussed and signed.  We retained a copy for our records.  The patient would like to proceed with radiation and will be scheduled for CT simulation.  PLAN: Will proceed  with CT simulation this morning.  Treatments to begin in approximately 7-10 working days.  Anticipate between 3 and 5 SBRT treatments both lesions.  Addendum: During planning today the small pulmonary nodule in the right lower lobe could not be identified.  This will be followed on serial CT scans and if progresses then he could receive SBRT to this third lesion at a later date.   35 minutes of total time was spent for this patient encounter, including preparation, face-to-face counseling with the patient and coordination of care, physical exam, and documentation of the encounter.   ------------------------------------------------  Blair Promise, PhD, MD  This document serves as a record of services personally performed by Gery Pray, MD. It was created on his behalf by Roney Mans, a trained medical scribe. The creation of this record is based on the scribe's personal observations and the provider's statements to them. This document has been checked and approved by the attending provider.

## 2022-05-14 NOTE — Patient Instructions (Signed)
Medication Instructions:  No changes *If you need a refill on your cardiac medications before your next appointment, please call your pharmacy*   Lab Work: None ordered If you have labs (blood work) drawn today and your tests are completely normal, you will receive your results only by: [place] (if you have MyChart) OR A paper copy in the mail If you have any lab test that is abnormal or we need to change your treatment, we will call you to review the results.   Testing/Procedures: None ordered   Follow-Up: At [facility], you and your health needs are our priority.  As part of our continuing mission to provide you with exceptional heart care, we have created designated Provider Care Teams.  These Care Teams include your primary Cardiologist (physician) and Advanced Practice Providers (APPs -  Physician Assistants and Nurse Practitioners) who all work together to provide you with the care you need, when you need it.  We recommend signing up for the patient portal called "MyChart".  Sign up information is provided on this After Visit Summary.  MyChart is used to connect with patients for Virtual Visits (Telemedicine).  Patients are able to view lab/test results, encounter notes, upcoming appointments, etc.  Non-urgent messages can be sent to your provider as well.   To learn more about what you can do with MyChart, go to [email or web address].    Your next appointment:   6 month(s)  Provider:   Kathlyn Sacramento, MD

## 2022-05-14 NOTE — Progress Notes (Signed)
CYTOLOGY - NON PAP CASE: [id number] PATIENT: David David Non-Gynecological Cytology Report     Clinical History: None provided    FINAL MICROSCOPIC DIAGNOSIS:  A.  RIGHT LUNG, UPPER LOBE, FINE NEEDLE ASPIRATION: - Malignant - Adenocarcinoma  B.  LEFT LUNG, LINGULA, FINE NEEDLE ASPIRATION: - Malignant - Adenocarcinoma  C.  LEFT LUNG, LINGULA, BRUSHING: - No malignant cells identified - Scant cellularity; benign bronchial cells and pulmonary macrophages      Tobacco/Marijuana/Snuff/ETOH use: yes ,States that he smokes 10-12 cigarettes daily.   Past/Anticipated interventions by cardiothoracic surgery, if any:  Video Bronchoscopy with Robotic Assisted Bronchoscopic Navigation    Date of Operation: 03/21/2022    Pre-op Diagnosis: pulmonary nodules   Post-op Diagnosis: same   Surgeon: Walker Shadow     Anesthesia: General endotracheal anesthesia   Operation: Flexible video fiberoptic bronchoscopy with robotic assistance and biopsies.     Past/Anticipated interventions by medical oncology, if any:      Signs/Symptoms Weight changes, if any:  Wt Readings from Last 3 Encounters:  05/14/22 175 lb 12.8 oz (79.7 kg)  04/25/22 172 lb (78 kg)  04/15/22 172 lb (78 kg)    Respiratory complaints, if any:  Yes, Productive cough Hemoptysis, if any: No Pain issues, if any:  Yes, back pain rates 2/10.   SAFETY ISSUES: Prior radiation? no Pacemaker/ICD? no  Possible current pregnancy?no Is the patient on methotrexate? yes   Current Complaints / other details:   BP 139/68   Pulse 60   Temp (!) 96.5 F (35.8 C)   Resp 18   Ht '6\' 3"'$  (1.905 m)   Wt 175 lb 12.8 oz (79.7 kg)   SpO2 100%   BMI 21.97 kg/m

## 2022-05-14 NOTE — Progress Notes (Signed)
Cardiology Office Note   Date:  05/14/2022   ID:  David David, David David, David David, MRN [id number]  PCP:  [name], [name], MD  Cardiologist:   David Sacramento, MD   No chief complaint on file.     History of Present Illness: David David is a 72 y.o. male who presents for a follow-up visit regarding peripheral arterial disease. The patient has lower extremity peripheral arterial disease with recurrent atheroembolic events to the left foot due to distal left popliteal artery stenosis with previous Cypher drug-eluting stent placement which was used in order to treat an ulcerated plaque with distal embolization.  He was seen in 11/2012  for a painless rash involving the left great toe and medial foot, with typical appearance of an athero embolic event. Echocardiogram was unremarkable.  CTA of the chest abdomen and pelvis with runoff to the feet demonstrated scattered atheromatous the aorta but no significant findings.  The left tibioperoneal stent had greater than 70% stenosis. ABI was normal but there was significant dampening in pressure noted in the big toe. I proceeded with angiography which confirmed this and also showed an occluded dorsalis pedis likely from embolization. I performed balloon angioplasty with an Angioscore balloon to both ostial anterior tibial and ostial tibioperoneal trunk.   He had cardiac catheterization in May of 2016 which showed moderate left circumflex stenosis and no evidence of obstructive disease.   He had dizziness and presyncope in the past and thought to have a TIA but neurologic evaluation was overall unremarkable.  He was seen last year due to ischemic right foot.  Angiography was done in August 2023 which showed an occluded right anterior tibial artery which was thought to be subacute and thrombotic.  However, his posterior tibial artery was large and dominant and supplied the pedal arch.  Thus, no revascularization was needed.  He was recent diagnosed  with adenocarcinoma of the lungs with plans to start radiation therapy in the near future.  He denies chest pain.  He has chronic exertional dyspnea due to COPD.  No significant leg claudication no lower extremity ulceration.  Past Medical History:  Diagnosis Date   Alcoholism in family    Anxiety    Arthritis    Depressive disorder, not elsewhere classified    Diverticulosis of colon (without mention of hemorrhage)    Dysfunction of eustachian tube    Exposure to STD    Family history of diabetes mellitus    Family history of psychiatric condition    family Hx of suicide    GERD (gastroesophageal reflux disease)    Impotence of organic origin    erectile dysfunction   Lipoprotein deficiencies    Other diseases of lung, not elsewhere classified    pulmonary nodule   Peripheral vascular disease, unspecified ([place])    lower extremity peripheral artery disease   Personal history of colonic polyps    Routine general medical examination at a health care facility    Seizures ([place])    Sprain of neck    Hx   Thyroid disease    Tobacco use disorder    Unspecified hypertensive kidney disease with chronic kidney disease stage I through stage IV, or unspecified(403.90)    cholesterol embolization syndrome    Past Surgical History:  Procedure Laterality Date   ABDOMINAL AORTOGRAM W/LOWER EXTREMITY N/A 11/07/2021   Procedure: ABDOMINAL AORTOGRAM W/LOWER EXTREMITY;  Surgeon: David Hampshire, MD;  Location: [facility];  Service: Cardiovascular;  Laterality: N/A;   angiography     bilateral lower extremity angiography, stenting of the left.    BRONCHIAL BIOPSY  03/21/2022   Procedure: BRONCHIAL BIOPSIES;  Surgeon: David Hurter, MD;  Location: [facility];  Service: Pulmonary;;   BRONCHIAL BRUSHINGS  03/21/2022   Procedure: BRONCHIAL BRUSHINGS;  Surgeon: David Hurter, MD;  Location: [facility];  Service: Pulmonary;;   BRONCHIAL NEEDLE ASPIRATION BIOPSY  03/21/2022    Procedure: BRONCHIAL NEEDLE ASPIRATION BIOPSIES;  Surgeon: David Hurter, MD;  Location: [facility];  Service: Pulmonary;;   BRONCHIAL WASHINGS  03/21/2022   Procedure: BRONCHIAL WASHINGS;  Surgeon: David Hurter, MD;  Location: [facility] ENDOSCOPY;  Service: Pulmonary;;   CARDIAC CATHETERIZATION N/A 07/19/2014   Procedure: Left Heart Cath and Coronary Angiography;  Surgeon: David Man, MD;  Location: [facility]VAS[facility] CV LAB CUPID;  Service: Cardiovascular;  Laterality: N/A;   CATARACT EXTRACTION Bilateral    EYE SURGERY  2016   blown macular   FIDUCIAL MARKER PLACEMENT  03/21/2022   Procedure: FIDUCIAL MARKER PLACEMENT;  Surgeon: David Hurter, MD;  Location: [facility] ENDOSCOPY;  Service: Pulmonary;;   KNEE ARTHROSCOPY Left    x 2   LOWER EXTREMITY ANGIOGRAM N/A 11/04/2012   Procedure: LOWER EXTREMITY ANGIOGRAM;  Surgeon: David Hampshire, MD;  Location: [facility] CATH LAB;  Service: Cardiovascular;  Laterality: N/A;   POPLITEAL ARTERY ANGIOPLASTY     didnt specify angioplasty or stent   SPERMATOCELECTOMY Left    testicular cyst   VASECTOMY       Current Outpatient Medications  Medication Sig Dispense Refill   albuterol (PROVENTIL HFA;VENTOLIN HFA) 108 (90 Base) MCG/ACT inhaler Inhale 2 puffs into the lungs every 6 (six) hours as needed for wheezing or shortness of breath.     alfuzosin (UROXATRAL) 10 MG David hr tablet Take 10 mg by mouth at bedtime.  11   amLODipine (NORVASC) 10 MG tablet Take 5 mg by mouth daily.     ammonium lactate (LAC-HYDRIN) 12 % lotion Apply 1 application topically 2 (two) times daily as needed for dry skin.     aspirin EC 81 MG tablet Take 1 tablet (81 mg total) by mouth daily. 90 tablet 3   clopidogrel (PLAVIX) 75 MG tablet Take 1 tablet (75 mg total) by mouth daily. 30 tablet 3   escitalopram (LEXAPRO) 10 MG tablet Take 30 mg by mouth every morning.      ferrous sulfate 325 (65 FE) MG tablet Take 325 mg by mouth 3 (three) times a week. Take on Monday, Wednesday, Friday      gabapentin (NEURONTIN) 300 MG capsule Take 600 mg by mouth 2 (two) times daily.     Lactobacillus (ACIDOPHILUS) CAPS capsule Take 1 capsule by mouth daily.     mirtazapine (REMERON) 15 MG tablet Take 1 tablet (15 mg total) by mouth at bedtime. 30 tablet 2   Multiple Vitamin (MULTI-VITAMIN) tablet Take 1 tablet by mouth daily. theragran     nitroGLYCERIN (NITRODUR - DOSED IN MG/David HR) 0.2 mg/hr patch Place 1 patch (0.2 mg total) onto the skin daily. (Patient taking differently: Place 0.2 mg onto the skin daily as needed (Apply to foot for blood flow).) 30 patch 12   Omega-3 Fatty Acids (FISH OIL) 1000 MG CAPS Take 1,000 mg by mouth 2 (two) times daily.      omeprazole (PRILOSEC) 40 MG capsule Take 40 mg by mouth daily.     pravastatin (PRAVACHOL) 40 MG tablet Take 40  mg by mouth at bedtime.     Tiotropium Bromide Monohydrate (SPIRIVA RESPIMAT) 2.5 MCG/ACT AERS Inhale 2 puffs into the lungs daily. 4 David 6   zolpidem (AMBIEN) 10 MG tablet Take 10 mg by mouth at bedtime as needed for sleep.     Current Facility-Administered Medications  Medication Dose Route Frequency Provider Last Rate Last Admin   0.9 %  sodium chloride infusion  500 mL Intravenous Continuous Irene Shipper, MD       sodium chloride flush (NS) 0.9 % injection 3 mL  3 mL Intravenous Q12H David Hampshire, MD        Allergies:   Wellbutrin [bupropion], Flomax [tamsulosin], Ritalin [methylphenidate], Tape, Trazodone, Neomycin-bacitracin zn-polymyx, Niacin, and Paroxetine    Social History:  The patient  reports that he has been smoking cigarettes. He has a 50.00 pack-year smoking history. He has never used smokeless tobacco. He reports current alcohol use. He reports current drug use. Drug: Marijuana.   Family History:  The patient's family history includes Alzheimer's disease in his brother and sister; CAD in his father; Congestive Heart Failure in his mother; Diabetes in his brother; Lung cancer in his brother.    ROS:   Please see the history of present illness.   Otherwise, review of systems are positive for none.   All other systems are reviewed and negative.    PHYSICAL EXAM: VS:  BP 130/70   Pulse 61   Ht '6\' 3"'$  (1.905 m)   Wt 175 lb (79.4 kg)   BMI 21.87 kg/m  , BMI Body mass index is 21.87 kg/m. GEN: Well nourished, well developed, in no acute distress  HEENT: normal  Neck: no JVD, carotid bruits, or masses Cardiac: RRR; no murmurs, rubs, or gallops,no edema  Respiratory:  clear to auscultation bilaterally, normal work of breathing GI: soft, nontender, nondistended, + BS MS: no deformity or atrophy  Skin: warm and dry, no rash Neuro:  Strength and sensation are intact Psych: euthymic mood, full affect Vascular: Dorsalis pedis: +2 on the left.  Posterior tibial is +1.  On the right, the dorsalis pedis is absent but has normal posterior tibial pulse.  no ulceration   EKG:  EKG is ordered today. EKG showed normal sinus rhythm with PACs.   Recent Labs: 03/21/2022: BUN 10; Creatinine, Ser 0.90; Hemoglobin 14.4; Platelets 182; Potassium 3.9; Sodium 139    Lipid Panel    Component Value Date/Time   CHOL 126 04/17/2017 0344   TRIG 150 (H) 04/17/2017 0344   HDL 36 (L) 04/17/2017 0344   CHOLHDL 3.5 04/17/2017 0344   VLDL 30 04/17/2017 0344   LDLCALC 60 04/17/2017 0344      Wt Readings from Last 3 Encounters:  02/27/David 175 lb (79.4 kg)  02/27/David 175 lb 12.8 oz (79.7 kg)  02/08/David 172 lb (78 kg)          No data to display            ASSESSMENT AND PLAN:  1.  Peripheral arterial disease: Previous lower extremity intervention on the left side.  Occluded right anterior tibial artery.  Currently with no claudication, rest pain or ulceration.  Continue lifelong dual antiplatelet therapy as tolerated.   2. Tobacco use: He cut down to one third of a pack daily and is trying to quit completely.  I discussed the importance of smoking cessation.   3. Essential hypertension: Blood  pressure is reasonably controlled.  4. Hyperlipidemia: Continue treatment with pravastatin with  a target LDL of less than 70.    Most recent LDL was 60.  5.  Recent diagnosis of lung cancer: Plans to start radiation therapy in the near future.    Disposition:   Follow-up in 6 months. Signed,  David Sacramento, MD  05/14/2022 9:51 AM    [place]

## 2022-05-20 DIAGNOSIS — C3411 Malignant neoplasm of upper lobe, right bronchus or lung: Secondary | ICD-10-CM | POA: Diagnosis present

## 2022-05-20 DIAGNOSIS — C3492 Malignant neoplasm of unspecified part of left bronchus or lung: Secondary | ICD-10-CM | POA: Diagnosis present

## 2022-05-20 DIAGNOSIS — Z510 Encounter for antineoplastic radiation therapy: Secondary | ICD-10-CM | POA: Insufficient documentation

## 2022-05-20 DIAGNOSIS — F17210 Nicotine dependence, cigarettes, uncomplicated: Secondary | ICD-10-CM | POA: Diagnosis not present

## 2022-05-23 ENCOUNTER — Other Ambulatory Visit: Payer: Self-pay

## 2022-05-23 ENCOUNTER — Ambulatory Visit
Admission: RE | Admit: 2022-05-23 | Discharge: 2022-05-23 | Disposition: A | Discharge status: Home / Self Care | Payer: No Typology Code available for payment source | Source: Ambulatory Visit | Attending: Radiation Oncology | Admitting: Radiation Oncology

## 2022-05-23 DIAGNOSIS — R918 Other nonspecific abnormal finding of lung field: Secondary | ICD-10-CM

## 2022-05-23 DIAGNOSIS — Z510 Encounter for antineoplastic radiation therapy: Secondary | ICD-10-CM | POA: Diagnosis not present

## 2022-05-23 LAB — RAD ONC ARIA SESSION SUMMARY
Course Elapsed Days: 0
Plan Fractions Treated to Date: 1
Plan Fractions Treated to Date: 1
Plan Prescribed Dose Per Fraction: 12 Gy
Plan Prescribed Dose Per Fraction: 18 Gy
Plan Total Fractions Prescribed: 3
Plan Total Fractions Prescribed: 5
Plan Total Prescribed Dose: 54 Gy
Plan Total Prescribed Dose: 60 Gy
Reference Point Dosage Given to Date: 12 Gy
Reference Point Dosage Given to Date: 18 Gy
Reference Point Session Dosage Given: 12 Gy
Reference Point Session Dosage Given: 18 Gy
Session Number: 1

## 2022-05-24 ENCOUNTER — Ambulatory Visit: Payer: No Typology Code available for payment source | Admitting: Radiation Oncology

## 2022-05-24 NOTE — Telephone Encounter (Signed)
1 

## 2022-05-27 ENCOUNTER — Ambulatory Visit: Payer: No Typology Code available for payment source | Admitting: Radiation Oncology

## 2022-05-28 ENCOUNTER — Ambulatory Visit
Admission: RE | Admit: 2022-05-28 | Discharge: 2022-05-28 | Disposition: A | Discharge status: Home / Self Care | Payer: No Typology Code available for payment source | Source: Ambulatory Visit | Attending: Radiation Oncology | Admitting: Radiation Oncology

## 2022-05-28 ENCOUNTER — Other Ambulatory Visit: Payer: Self-pay

## 2022-05-28 DIAGNOSIS — Z510 Encounter for antineoplastic radiation therapy: Secondary | ICD-10-CM | POA: Diagnosis not present

## 2022-05-28 DIAGNOSIS — R918 Other nonspecific abnormal finding of lung field: Secondary | ICD-10-CM

## 2022-05-28 LAB — RAD ONC ARIA SESSION SUMMARY
Course Elapsed Days: 5
Plan Fractions Treated to Date: 2
Plan Fractions Treated to Date: 2
Plan Prescribed Dose Per Fraction: 12 Gy
Plan Prescribed Dose Per Fraction: 18 Gy
Plan Total Fractions Prescribed: 3
Plan Total Fractions Prescribed: 5
Plan Total Prescribed Dose: 54 Gy
Plan Total Prescribed Dose: 60 Gy
Reference Point Dosage Given to Date: 24 Gy
Reference Point Dosage Given to Date: 36 Gy
Reference Point Session Dosage Given: 12 Gy
Reference Point Session Dosage Given: 18 Gy
Session Number: 2

## 2022-05-30 ENCOUNTER — Other Ambulatory Visit: Payer: Self-pay

## 2022-05-30 ENCOUNTER — Ambulatory Visit
Admission: RE | Admit: 2022-05-30 | Discharge: 2022-05-30 | Disposition: A | Discharge status: Home / Self Care | Payer: No Typology Code available for payment source | Source: Ambulatory Visit | Attending: Radiation Oncology | Admitting: Radiation Oncology

## 2022-05-30 DIAGNOSIS — Z510 Encounter for antineoplastic radiation therapy: Secondary | ICD-10-CM | POA: Diagnosis not present

## 2022-05-30 DIAGNOSIS — R918 Other nonspecific abnormal finding of lung field: Secondary | ICD-10-CM

## 2022-05-30 LAB — RAD ONC ARIA SESSION SUMMARY
Course Elapsed Days: 7
Plan Fractions Treated to Date: 3
Plan Fractions Treated to Date: 3
Plan Prescribed Dose Per Fraction: 12 Gy
Plan Prescribed Dose Per Fraction: 18 Gy
Plan Total Fractions Prescribed: 3
Plan Total Fractions Prescribed: 5
Plan Total Prescribed Dose: 54 Gy
Plan Total Prescribed Dose: 60 Gy
Reference Point Dosage Given to Date: 36 Gy
Reference Point Dosage Given to Date: 54 Gy
Reference Point Session Dosage Given: 12 Gy
Reference Point Session Dosage Given: 18 Gy
Session Number: 3

## 2022-05-31 ENCOUNTER — Ambulatory Visit: Payer: No Typology Code available for payment source | Admitting: Radiation Oncology

## 2022-06-04 ENCOUNTER — Ambulatory Visit
Admission: RE | Admit: 2022-06-04 | Discharge: 2022-06-04 | Disposition: A | Discharge status: Home / Self Care | Payer: No Typology Code available for payment source | Source: Ambulatory Visit | Attending: Radiation Oncology | Admitting: Radiation Oncology

## 2022-06-04 ENCOUNTER — Other Ambulatory Visit: Payer: Self-pay

## 2022-06-04 DIAGNOSIS — Z510 Encounter for antineoplastic radiation therapy: Secondary | ICD-10-CM | POA: Diagnosis not present

## 2022-06-04 DIAGNOSIS — R918 Other nonspecific abnormal finding of lung field: Secondary | ICD-10-CM

## 2022-06-04 LAB — RAD ONC ARIA SESSION SUMMARY
Course Elapsed Days: 12
Plan Fractions Treated to Date: 4
Plan Prescribed Dose Per Fraction: 12 Gy
Plan Total Fractions Prescribed: 5
Plan Total Prescribed Dose: 60 Gy
Reference Point Dosage Given to Date: 48 Gy
Reference Point Session Dosage Given: 12 Gy
Session Number: 4

## 2022-06-06 ENCOUNTER — Other Ambulatory Visit: Payer: Self-pay

## 2022-06-06 ENCOUNTER — Ambulatory Visit: Payer: No Typology Code available for payment source

## 2022-06-06 ENCOUNTER — Ambulatory Visit
Admission: RE | Admit: 2022-06-06 | Discharge: 2022-06-06 | Disposition: A | Discharge status: Home / Self Care | Payer: No Typology Code available for payment source | Source: Ambulatory Visit | Attending: Radiation Oncology | Admitting: Radiation Oncology

## 2022-06-06 DIAGNOSIS — Z510 Encounter for antineoplastic radiation therapy: Secondary | ICD-10-CM | POA: Diagnosis not present

## 2022-06-06 DIAGNOSIS — R918 Other nonspecific abnormal finding of lung field: Secondary | ICD-10-CM

## 2022-06-06 LAB — RAD ONC ARIA SESSION SUMMARY
Course Elapsed Days: 14
Plan Fractions Treated to Date: 5
Plan Prescribed Dose Per Fraction: 12 Gy
Plan Total Fractions Prescribed: 5
Plan Total Prescribed Dose: 60 Gy
Reference Point Dosage Given to Date: 60 Gy
Reference Point Session Dosage Given: 12 Gy
Session Number: 5

## 2022-06-07 NOTE — Radiation Completion Notes (Signed)
Patient Name: David David, David David MRN: [id number] Date of Birth: 1950/05/13 Referring Physician: Tomie China, M.D. Date of Service: 2022-06-07 Radiation Oncologist: Teryl Lucy, M.D. [place] END OF TREATMENT NOTE     Diagnosis: C34.11 Malignant neoplasm of upper lobe, right bronchus or lung; C34.12 Malignant neoplasm of upper lobe, left bronchus or lung Intent: Curative     ==========DELIVERED PLANS==========  First Treatment Date: 2022-05-23 - Last Treatment Date: 2022-06-06   Plan Name: Lung_R_SBRT Site: Lung, Right Technique: SBRT/SRT-IMRT Mode: Photon Dose Per Fraction: 12 Gy Prescribed Dose (Delivered / Prescribed): 60 Gy / 60 Gy Prescribed Fxs (Delivered / Prescribed): 5 / 5   Plan Name: Lung_L_SBRT Site: Lung, Left Technique: SBRT/SRT-IMRT Mode: Photon Dose Per Fraction: 18 Gy Prescribed Dose (Delivered / Prescribed): 54 Gy / 54 Gy Prescribed Fxs (Delivered / Prescribed): 3 / 3     ==========ON TREATMENT VISIT DATES========== 2022-05-23, 2022-05-23, 2022-05-28, 2022-05-28, 2022-05-30, 2022-05-30, 2022-06-04, 2022-06-04, 2022-06-06     ==========UPCOMING VISITS==========       ==========APPENDIX - ON TREATMENT VISIT NOTES==========   See weekly On Treatment Notes is Epic for details.

## 2022-06-10 ENCOUNTER — Other Ambulatory Visit: Payer: Self-pay | Admitting: Radiation Oncology

## 2022-06-10 ENCOUNTER — Telehealth: Payer: Self-pay

## 2022-06-10 MED ORDER — SUCRALFATE 1 G PO TABS: 1.0000 g | ORAL_TABLET | Freq: Three times a day (TID) | ORAL | 1 refills | Status: AC

## 2022-06-10 NOTE — Telephone Encounter (Signed)
Daughter called in to report patient having difficulty swallowing since completing SBRT to right and left lungs on 06/06/22. Requesting medication to assist with swallowing.   Pharmacy [facility] on [address] Linna Hoff

## 2022-07-10 ENCOUNTER — Encounter: Payer: Self-pay | Admitting: Radiation Oncology

## 2022-07-10 NOTE — Progress Notes (Signed)
Radiation Oncology         (336) [phone] ________________________________  Name: David David MRN: [id number]  Date: 07/11/2022  DOB: 10-May-1950  Follow-Up Visit Note  CC: [place], [name], MD  Darlis Loan, MD  No diagnosis found.  Diagnosis: Bilateral lung cancers: Adenocarcinoma of the right upper lobe and left lingula    Interval Since Last Radiation: 1 month and 4 days   Intent: Curative  Radiation Treatment Dates: 05/23/2022 through 06/06/2022 Site Technique Total Dose (Gy) Dose per Fx (Gy) Completed Fx Beam Energies  Lung, Right: Lung_R IMRT 60/60 12 5/5 6XFFF  Lung, Left: Lung_L IMRT 54/54 18 3/3 6XFFF   Narrative:  The patient returns today for routine follow-up. The patient tolerated radiation therapy relatively well. During his final weekly treatment check on 06/04/22, the patient reported fatigue. He denied any other symptoms. Physical exam performed on that same date noted some mild inspiratory wheezing on auscultation. Lungs otherwise sounded clear.    No other significant oncologic interval history since the patient completed radiation therapy.   ***                             Allergies:  is allergic to wellbutrin [bupropion], flomax [tamsulosin], ritalin [methylphenidate], tape, trazodone, neomycin-bacitracin zn-polymyx, niacin, and paroxetine.  Meds: Current Outpatient Medications  Medication Sig Dispense Refill   albuterol (PROVENTIL HFA;VENTOLIN HFA) 108 (90 Base) MCG/ACT inhaler Inhale 2 puffs into the lungs every 6 (six) hours as needed for wheezing or shortness of breath.     alfuzosin (UROXATRAL) 10 MG 24 hr tablet Take 10 mg by mouth at bedtime.  11   amLODipine (NORVASC) 10 MG tablet Take 5 mg by mouth daily.     ammonium lactate (LAC-HYDRIN) 12 % lotion Apply 1 application topically 2 (two) times daily as needed for dry skin.     aspirin EC 81 MG tablet Take 1 tablet (81 mg total) by mouth daily. 90 tablet 3   clopidogrel (PLAVIX) 75 MG tablet  Take 1 tablet (75 mg total) by mouth daily. 30 tablet 3   escitalopram (LEXAPRO) 10 MG tablet Take 30 mg by mouth every morning.      ferrous sulfate 325 (65 FE) MG tablet Take 325 mg by mouth 3 (three) times a week. Take on Monday, Wednesday, Friday     gabapentin (NEURONTIN) 300 MG capsule Take 600 mg by mouth 2 (two) times daily.     Lactobacillus (ACIDOPHILUS) CAPS capsule Take 1 capsule by mouth daily.     mirtazapine (REMERON) 15 MG tablet Take 1 tablet (15 mg total) by mouth at bedtime. 30 tablet 2   Multiple Vitamin (MULTI-VITAMIN) tablet Take 1 tablet by mouth daily. theragran     nitroGLYCERIN (NITRODUR - DOSED IN MG/24 HR) 0.2 mg/hr patch Place 1 patch (0.2 mg total) onto the skin daily. (Patient taking differently: Place 0.2 mg onto the skin daily as needed (Apply to foot for blood flow).) 30 patch 12   Omega-3 Fatty Acids (FISH OIL) 1000 MG CAPS Take 1,000 mg by mouth 2 (two) times daily.      omeprazole (PRILOSEC) 40 MG capsule Take 40 mg by mouth daily.     pravastatin (PRAVACHOL) 40 MG tablet Take 40 mg by mouth at bedtime.     sucralfate (CARAFATE) 1 g tablet Take 1 tablet (1 g total) by mouth 4 (four) times daily -  with meals and at bedtime. Crush and  dissolve in 10 mL's of warm water prior to swallowing, take 20 min before meals 60 tablet 1   Tiotropium Bromide Monohydrate (SPIRIVA RESPIMAT) 2.5 MCG/ACT AERS Inhale 2 puffs into the lungs daily. 4 g 6   zolpidem (AMBIEN) 10 MG tablet Take 10 mg by mouth at bedtime as needed for sleep.     Current Facility-Administered Medications  Medication Dose Route Frequency Provider Last Rate Last Admin   0.9 %  sodium chloride infusion  500 mL Intravenous Continuous Hilarie Fredrickson, MD       sodium chloride flush (NS) 0.9 % injection 3 mL  3 mL Intravenous Q12H [place] Ouch, MD        Physical Findings: The patient is in no acute distress. Patient is alert and oriented.  vitals were not taken for this visit. .  No significant  changes. Lungs are clear to auscultation bilaterally. Heart has regular rate and rhythm. No palpable cervical, supraclavicular, or axillary adenopathy. Abdomen soft, non-tender, normal bowel sounds.   Lab Findings: Lab Results  Component Value Date   WBC 11.1 (H) 03/21/2022   HGB 14.4 03/21/2022   HCT 43.3 03/21/2022   MCV 93.1 03/21/2022   PLT 182 03/21/2022    Radiographic Findings: No results found.  Impression: Bilateral lung cancers: Adenocarcinoma of the right upper lobe and left lingula    The patient is recovering from the effects of radiation.  ***  Plan:  ***   *** minutes of total time was spent for this patient encounter, including preparation, face-to-face counseling with the patient and coordination of care, physical exam, and documentation of the encounter. ____________________________________  Billie Lade, PhD, MD  This document serves as a record of services personally performed by Antony Blackbird, MD. It was created on his behalf by Neena Rhymes, a trained medical scribe. The creation of this record is based on the scribe's personal observations and the provider's statements to them. This document has been checked and approved by the attending provider.

## 2022-07-10 NOTE — Progress Notes (Signed)
  Radiation Oncology         [id number]) [phone] ________________________________  Patient Name: David David MRN: [id number] DOB: [birth year] Referring Physician: Sherren Kerns B Date of Service: 06/06/2022 [facility], [place]                                                        End Of Treatment Note  Diagnoses: C34.11-Malignant neoplasm of upper lobe, right bronchus or lung C34.12-Malignant neoplasm of upper lobe, left bronchus or lung  Cancer Staging: Bilateral lung cancers: Adenocarcinoma of the right upper lobe and left lingula    Intent: Curative  Radiation Treatment Dates: 05/23/2022 through 06/06/2022 Site Technique Total Dose (Gy) Dose per Fx (Gy) Completed Fx Beam Energies  Lung, Right: Lung_R IMRT 60/60 12 5/5 6XFFF  Lung, Left: Lung_L IMRT 54/54 18 3/3 6XFFF   Narrative: The patient tolerated radiation therapy relatively well. During his final weekly treatment check on 06/04/22, the patient reported fatigue. He denied any other symptoms. Physical exam performed on that same date noted some mild inspiratory wheezing on auscultation. Lungs otherwise sounded clear.   Plan: The patient will follow-up with radiation oncology in one month .  ________________________________________________ -----------------------------------  Billie Lade, PhD, MD  This document serves as a record of services personally performed by Antony Blackbird, MD. It was created on his behalf by Neena Rhymes, a trained medical scribe. The creation of this record is based on the scribe's personal observations and the provider's statements to them. This document has been checked and approved by the attending provider.

## 2022-07-11 ENCOUNTER — Encounter: Payer: Self-pay | Admitting: Radiation Oncology

## 2022-07-11 ENCOUNTER — Ambulatory Visit
Admission: RE | Admit: 2022-07-11 | Discharge: 2022-07-11 | Disposition: A | Discharge status: Home / Self Care | Payer: No Typology Code available for payment source | Source: Ambulatory Visit | Attending: Radiation Oncology | Admitting: Radiation Oncology

## 2022-07-11 ENCOUNTER — Other Ambulatory Visit: Payer: Self-pay

## 2022-07-11 VITALS — BP 140/82 | HR 77 | Temp 97.0°F | Resp 18 | Ht 75.0 in | Wt 169.1 lb

## 2022-07-11 DIAGNOSIS — R918 Other nonspecific abnormal finding of lung field: Secondary | ICD-10-CM

## 2022-07-11 DIAGNOSIS — C3411 Malignant neoplasm of upper lobe, right bronchus or lung: Secondary | ICD-10-CM | POA: Diagnosis present

## 2022-07-11 DIAGNOSIS — C3412 Malignant neoplasm of upper lobe, left bronchus or lung: Secondary | ICD-10-CM | POA: Diagnosis not present

## 2022-07-11 DIAGNOSIS — Z923 Personal history of irradiation: Secondary | ICD-10-CM | POA: Insufficient documentation

## 2022-07-11 HISTORY — DX: Personal history of irradiation: Z92.3

## 2022-07-11 MED ORDER — METHYLPREDNISOLONE 4 MG PO TBPK: ORAL_TABLET | ORAL | 0 refills | Status: AC

## 2022-07-11 NOTE — Progress Notes (Signed)
David David is here today for follow up post radiation to the lung.  Lung Side:Right, patient completed treatment on 06/06/22  Does the patient complain of any of the following: Pain: Reports discomfort to right chest. Shortness of breath w/wo exertion: Yes, mostly on exertion.  Cough: Yes Hemoptysis: No Pain with swallowing: Yes Swallowing/choking concerns: Yes Appetite: Poor Weight:  Wt Readings from Last 3 Encounters:  07/11/22 169 lb 2 oz (76.7 kg)  05/14/22 175 lb 12.8 oz (79.7 kg)  05/14/22 175 lb (79.4 kg)    Energy Level: Low, reports severe fatigue.  Post radiation skin Changes: No    Additional comments if applicable:   BP (!) 140/82 (BP Location: Left Arm, Patient Position: Sitting)   Pulse 77   Temp (!) 97 F (36.1 C) (Temporal)   Resp 18   Ht  (1.905 m)   Wt 169 lb 2 oz (76.7 kg)   SpO2 98%   BMI 21.14 kg/m

## 2022-08-19 ENCOUNTER — Encounter (HOSPITAL_BASED_OUTPATIENT_CLINIC_OR_DEPARTMENT_OTHER): Payer: Self-pay | Admitting: Emergency Medicine

## 2022-08-19 ENCOUNTER — Other Ambulatory Visit: Payer: Self-pay

## 2022-08-19 ENCOUNTER — Emergency Department (HOSPITAL_BASED_OUTPATIENT_CLINIC_OR_DEPARTMENT_OTHER)
Admission: EM | Admit: 2022-08-19 | Discharge: 2022-08-19 | Disposition: A | Discharge status: Home / Self Care | Payer: No Typology Code available for payment source | Attending: Emergency Medicine | Admitting: Emergency Medicine

## 2022-08-19 DIAGNOSIS — L03211 Cellulitis of face: Secondary | ICD-10-CM

## 2022-08-19 DIAGNOSIS — R21 Rash and other nonspecific skin eruption: Secondary | ICD-10-CM | POA: Diagnosis present

## 2022-08-19 DIAGNOSIS — L739 Follicular disorder, unspecified: Secondary | ICD-10-CM | POA: Diagnosis not present

## 2022-08-19 DIAGNOSIS — Z85110 Personal history of malignant carcinoid tumor of bronchus and lung: Secondary | ICD-10-CM | POA: Insufficient documentation

## 2022-08-19 MED ORDER — DOXYCYCLINE HYCLATE 100 MG PO CAPS: 100.0000 mg | ORAL_CAPSULE | Freq: Two times a day (BID) | ORAL | 0 refills | Status: AC

## 2022-08-19 NOTE — Discharge Instructions (Addendum)
Thank you for letting us take care of you today.  We are treating you with antibiotics for a superimposed bacterial infection. Please take these as prescribed. We swabbed the area for HSV 1 and 2 - this will not result today. Follow up via MyChart or with your PCP for these results and continued management.   For any new or worsening symptoms, please return to nearest ED for re-evaluation.

## 2022-08-19 NOTE — ED Provider Notes (Signed)
[name] Provider Note   CSN: [id number] Arrival date & time: 08/19/22  1451     History  Chief Complaint  Patient presents with   Rash    David David is a 72 y.o. male with PMH lung cancer who presents to ED c/o facial rash for the last month. Treated with valacyclovir and prednisone with no relief. Suspected to be herpes infection. No fever, chills, cough, congestion. No new medications or exposures prior to onset. Describes it as itching but not painful. No history of this.       Home Medications Prior to Admission medications   Medication Sig Start Date End Date Taking? Authorizing Provider  doxycycline (VIBRAMYCIN) 100 MG capsule Take 1 capsule (100 mg total) by mouth 2 (two) times daily for 7 days. 08/19/22 08/26/22 Yes Borden Thune L, PA-C  albuterol (PROVENTIL HFA;VENTOLIN HFA) 108 (90 Base) MCG/ACT inhaler Inhale 2 puffs into the lungs every 6 (six) hours as needed for wheezing or shortness of breath.    [provider]  alfuzosin (UROXATRAL) 10 MG 24 hr tablet Take 10 mg by mouth at bedtime. 03/24/17   [provider]  amLODipine (NORVASC) 10 MG tablet Take 5 mg by mouth daily.    [provider]  ammonium lactate (LAC-HYDRIN) 12 % lotion Apply 1 application topically 2 (two) times daily as needed for dry skin.    [provider]  aspirin EC 81 MG tablet Take 1 tablet (81 mg total) by mouth daily. 10/09/12   Tonny Bollman, MD  clopidogrel (PLAVIX) 75 MG tablet Take 1 tablet (75 mg total) by mouth daily. 11/04/12   Iran Ouch, MD  escitalopram (LEXAPRO) 10 MG tablet Take 30 mg by mouth every morning.  10/28/12   Tonny Bollman, MD  ferrous sulfate 325 (65 FE) MG tablet Take 325 mg by mouth 3 (three) times a week. Take on Monday, Wednesday, Friday    [provider]  gabapentin (NEURONTIN) 300 MG capsule Take 600 mg by mouth 2 (two) times daily.    [provider]   Lactobacillus (ACIDOPHILUS) CAPS capsule Take 1 capsule by mouth daily.    [provider]  mirtazapine (REMERON) 15 MG tablet Take 1 tablet (15 mg total) by mouth at bedtime. 10/28/16   [place], [name], MD  Multiple Vitamin (MULTI-VITAMIN) tablet Take 1 tablet by mouth daily. theragran    [provider]  nitroGLYCERIN (NITRODUR - DOSED IN MG/24 HR) 0.2 mg/hr patch Place 1 patch (0.2 mg total) onto the skin daily. Patient taking differently: Place 0.2 mg onto the skin daily as needed (Apply to foot for blood flow). 11/08/21   Nadara Mustard, MD  Omega-3 Fatty Acids (FISH OIL) 1000 MG CAPS Take 1,000 mg by mouth 2 (two) times daily.     [provider]  omeprazole (PRILOSEC) 40 MG capsule Take 40 mg by mouth daily.    [provider]  pravastatin (PRAVACHOL) 40 MG tablet Take 40 mg by mouth at bedtime.    [provider]  sucralfate (CARAFATE) 1 g tablet Take 1 tablet (1 g total) by mouth 4 (four) times daily -  with meals and at bedtime. Crush and dissolve in 10 mL's of warm water prior to swallowing, take 20 min before meals 06/10/22   Antony Blackbird, MD  Tiotropium Bromide Monohydrate (SPIRIVA RESPIMAT) 2.5 MCG/ACT AERS Inhale 2 puffs into the lungs daily. 04/09/22   Omar Person, MD  zolpidem (AMBIEN) 10 MG tablet Take 10 mg by mouth at bedtime as needed for sleep. 11/27/21   [provider]      Allergies    Wellbutrin [bupropion], Flomax [tamsulosin], Ritalin [methylphenidate], Tape, Trazodone, Neomycin-bacitracin zn-polymyx, Niacin, and Paroxetine    Review of Systems   Review of Systems  All other systems reviewed and are negative.   Physical Exam Updated Vital Signs BP (!) 160/97 (BP Location: Right Arm)   Pulse 79   Temp 98.1 F (36.7 C) (Oral)   Resp 18   Ht 6\' 3"  (1.905 m)   Wt 77.6 kg   SpO2 100%   BMI 21.37 kg/m  Physical Exam Vitals and nursing note reviewed.  Constitutional:      General: He is not in acute  distress.    Appearance: Normal appearance. He is not ill-appearing or toxic-appearing.  HENT:     Head: Normocephalic and atraumatic.     Mouth/Throat:     Mouth: Mucous membranes are moist.  Eyes:     Conjunctiva/sclera: Conjunctivae normal.  Cardiovascular:     Rate and Rhythm: Normal rate and regular rhythm.     Heart sounds: No murmur heard. Pulmonary:     Effort: Pulmonary effort is normal.     Breath sounds: Normal breath sounds.  Abdominal:     General: Abdomen is flat.     Palpations: Abdomen is soft.     Tenderness: There is no abdominal tenderness.  Musculoskeletal:        General: Normal range of motion.     Cervical back: Normal range of motion and neck supple.     Right lower leg: No edema.     Left lower leg: No edema.  Skin:    General: Skin is warm and dry.     Capillary Refill: Capillary refill takes less than 2 seconds.  Neurological:     Mental Status: He is alert. Mental status is at baseline.  Psychiatric:        Behavior: Behavior normal.    Rash with increased erythema to right side of mouth, areas of honey crusting, rash appears vesicular with some ruptured lesions, no significant intra-oral lesions  ED Results / Procedures / Treatments   Labs (all labs ordered are listed, but only abnormal results are displayed) Labs Reviewed  HSV 1/2 PCR (SURFACE)    EKG None  Radiology No results found.  Procedures Procedures    Medications Ordered in ED Medications - No data to display  ED Course/ Medical Decision Making/ A&P                             Medical Decision Making Amount and/or Complexity of Data Reviewed Labs: ordered. Decision-making details documented in ED Course.   Medical Decision Making:   David David is a 72 y.o. male who presented to the ED today with rash detailed above.    Patient's presentation is complicated by their history of lung cancer, recent viral treatment/steroids.  Complete initial physical exam  performed, notably the patient was in NAD. Rash as above.    Reviewed and confirmed nursing documentation for past medical history, family history, social history.    Initial Assessment:   With the patient's presentation, differential diagnosis includes but is not limited to HSV, cellulitis, candidiasis, allergic reaction, viral syndrome.  This is most consistent with an acute complicated illness  Initial Plan:  HSV swabs Objective evaluation as  below reviewed   Initial Study Results:   Laboratory  Pending  Final Assessment and Plan:   72 year old male presents to ED with peri-oral rash for a month intermittently. Vesicular appearing with areas of overlying erythema, swelling. Appears to be consistent with HSV with questionable superimposed bacterial infection. Discussed with attending physician who co-signed this note who agreed with management after examining patient. Pt nontoxic appearing. Hypertensive but afebrile, vital signs stable. Patent airway, no airway obstruction, LCTA. Will swab for HSV and treat for folliculitis and have closely follow up. Strict ED return precautions given, all questions answered, and stable for discharge.     Clinical Impression:  1. Folliculitis   2. Cellulitis of face      Discharge           Final Clinical Impression(s) / ED Diagnoses Final diagnoses:  Folliculitis  Cellulitis of face    Rx / DC Orders ED Discharge Orders          Ordered    doxycycline (VIBRAMYCIN) 100 MG capsule  2 times daily        08/19/22 1711              Tonette Lederer, PA-C 08/19/22 1718    Ernie Avena, MD 08/19/22 1909

## 2022-08-19 NOTE — ED Notes (Signed)
Discharge instructions, follow up care, and prescription reviewed and explained, pt verbalized understanding and had no further questions on d/c. Pt caox4, ambulatory, NAD on d/c.  

## 2022-08-19 NOTE — ED Triage Notes (Signed)
Pt arrived from home, POV with rash to bilateral sides of exterior mouth and in R nostril for the last month. Intermittent flare-ups. Afebrile. Recent tx with radiation for lung CA. Last one in March 2024. A/ox4, VSS.

## 2022-08-20 LAB — HSV 1/2 PCR (SURFACE)
HSV-1 DNA: NOT DETECTED
HSV-2 DNA: NOT DETECTED

## 2022-08-23 ENCOUNTER — Telehealth: Payer: Self-pay

## 2022-08-23 NOTE — Telephone Encounter (Signed)
Transition Care Management Unsuccessful Follow-up Telephone Call  Date of discharge and from where:  [facility]  Attempts:  1st Attempt  Reason for unsuccessful TCM follow-up call:  Left voice message   Lenard Forth [facility] Guide, [facility] [phone] [address] [place], [place], [place] [address] Phone: [phone] Email: [email or web address]

## 2022-08-23 NOTE — Telephone Encounter (Signed)
Transition Care Management Unsuccessful Follow-up Telephone Call  Date of discharge and from where:  [facility]  Attempts:  2nd Attempt  Reason for unsuccessful TCM follow-up call:  Left voice message   Lenard Forth [facility] Guide, [facility] [phone] [address] [place], [place], [place] [address] Phone: [phone] Email: [email or web address]

## 2022-08-26 ENCOUNTER — Telehealth: Payer: Self-pay | Admitting: *Deleted

## 2022-08-26 NOTE — Telephone Encounter (Signed)
Called patient to inform of CT for 10-10-22- arrival time- 5:45 pm @ [facility], no restrictions to test, patient to receive results from Dr. Roselind Messier on 10-14-22 @ 11:45 am, spoke with patient and he is aware of these appts. and the instructions

## 2022-10-10 ENCOUNTER — Ambulatory Visit (HOSPITAL_COMMUNITY)
Admission: RE | Admit: 2022-10-10 | Discharge: 2022-10-10 | Disposition: A | Discharge status: Home / Self Care | Payer: No Typology Code available for payment source | Source: Ambulatory Visit | Attending: Radiology | Admitting: Radiology

## 2022-10-10 DIAGNOSIS — R918 Other nonspecific abnormal finding of lung field: Secondary | ICD-10-CM | POA: Diagnosis present

## 2022-10-13 NOTE — Progress Notes (Signed)
Radiation Oncology         [phone] ________________________________  Name: David David MRN: [id number]  Date: 10/14/2022  DOB: June 21, 1950  Follow-Up Visit Note  CC: [place], [name], MD  Darlis Loan, MD  No diagnosis found.  Diagnosis: Bilateral lung cancers: Adenocarcinoma of the right upper lobe and left lingula    Interval Since Last Radiation: 4 months and 8 days   Intent: Curative  Radiation Treatment Dates: 05/23/2022 through 06/06/2022 Site Technique Total Dose (Gy) Dose per Fx (Gy) Completed Fx Beam Energies  Lung, Right: Lung_R IMRT 60/60 12 5/5 6XFFF  Lung, Left: Lung_L IMRT 54/54 18 3/3 6XFFF    Narrative:  The patient returns today for routine follow-up and to review recent imaging. He was last seen here for follow-up on 07/11/22.   His most recent chest CT on 10/10/22 showed: ***.                            Of note: the patient was seen in the ED on 08/19/22 for folliculitis / possible cellulitis after presenting with a peri-oral rash which had been present for nearly 1 month. Per encounter notes, the rash appeared to be consistent with HSV with questionable superimposed bacterial infection. He was prescribed a course of doxycyline at discharge.      No other significant interval history since the patient was last seen here for follow-up.   ***  Allergies:  is allergic to wellbutrin [bupropion], flomax [tamsulosin], ritalin [methylphenidate], tape, trazodone, neomycin-bacitracin zn-polymyx, niacin, and paroxetine.  Meds: Current Outpatient Medications  Medication Sig Dispense Refill   albuterol (PROVENTIL HFA;VENTOLIN HFA) 108 (90 Base) MCG/ACT inhaler Inhale 2 puffs into the lungs every 6 (six) hours as needed for wheezing or shortness of breath.     alfuzosin (UROXATRAL) 10 MG 24 hr tablet Take 10 mg by mouth at bedtime.  11   amLODipine (NORVASC) 10 MG tablet Take 5 mg by mouth daily.     ammonium lactate (LAC-HYDRIN) 12 % lotion Apply 1  application topically 2 (two) times daily as needed for dry skin.     aspirin EC 81 MG tablet Take 1 tablet (81 mg total) by mouth daily. 90 tablet 3   clopidogrel (PLAVIX) 75 MG tablet Take 1 tablet (75 mg total) by mouth daily. 30 tablet 3   escitalopram (LEXAPRO) 10 MG tablet Take 30 mg by mouth every morning.      ferrous sulfate 325 (65 FE) MG tablet Take 325 mg by mouth 3 (three) times a week. Take on Monday, Wednesday, Friday     gabapentin (NEURONTIN) 300 MG capsule Take 600 mg by mouth 2 (two) times daily.     Lactobacillus (ACIDOPHILUS) CAPS capsule Take 1 capsule by mouth daily.     mirtazapine (REMERON) 15 MG tablet Take 1 tablet (15 mg total) by mouth at bedtime. 30 tablet 2   Multiple Vitamin (MULTI-VITAMIN) tablet Take 1 tablet by mouth daily. theragran     nitroGLYCERIN (NITRODUR - DOSED IN MG/24 HR) 0.2 mg/hr patch Place 1 patch (0.2 mg total) onto the skin daily. (Patient taking differently: Place 0.2 mg onto the skin daily as needed (Apply to foot for blood flow).) 30 patch 12   Omega-3 Fatty Acids (FISH OIL) 1000 MG CAPS Take 1,000 mg by mouth 2 (two) times daily.      omeprazole (PRILOSEC) 40 MG capsule Take 40 mg by mouth daily.  pravastatin (PRAVACHOL) 40 MG tablet Take 40 mg by mouth at bedtime.     sucralfate (CARAFATE) 1 g tablet Take 1 tablet (1 g total) by mouth 4 (four) times daily -  with meals and at bedtime. Crush and dissolve in 10 mL's of warm water prior to swallowing, take 20 min before meals 60 tablet 1   Tiotropium Bromide Monohydrate (SPIRIVA RESPIMAT) 2.5 MCG/ACT AERS Inhale 2 puffs into the lungs daily. 4 g 6   zolpidem (AMBIEN) 10 MG tablet Take 10 mg by mouth at bedtime as needed for sleep.     Current Facility-Administered Medications  Medication Dose Route Frequency Provider Last Rate Last Admin   0.9 %  sodium chloride infusion  500 mL Intravenous Continuous Hilarie Fredrickson, MD       sodium chloride flush (NS) 0.9 % injection 3 mL  3 mL Intravenous  Q12H Iran Ouch, MD        Physical Findings: The patient is in no acute distress. Patient is alert and oriented.  vitals were not taken for this visit. .  No significant changes. Lungs are clear to auscultation bilaterally. Heart has regular rate and rhythm. No palpable cervical, supraclavicular, or axillary adenopathy. Abdomen soft, non-tender, normal bowel sounds.   Lab Findings: Lab Results  Component Value Date   WBC 11.1 (H) 03/21/2022   HGB 14.4 03/21/2022   HCT 43.3 03/21/2022   MCV 93.1 03/21/2022   PLT 182 03/21/2022    Radiographic Findings: No results found.  Impression: Bilateral lung cancers: Adenocarcinoma of the right upper lobe and left lingula    The patient is recovering from the effects of radiation.  ***  Plan:  ***   *** minutes of total time was spent for this patient encounter, including preparation, face-to-face counseling with the patient and coordination of care, physical exam, and documentation of the encounter. ____________________________________  Billie Lade, PhD, MD  This document serves as a record of services personally performed by Antony Blackbird, MD. It was created on his behalf by Neena Rhymes, a trained medical scribe. The creation of this record is based on the scribe's personal observations and the provider's statements to them. This document has been checked and approved by the attending provider.

## 2022-10-14 ENCOUNTER — Encounter: Payer: Self-pay | Admitting: Radiation Oncology

## 2022-10-14 ENCOUNTER — Ambulatory Visit
Admission: RE | Admit: 2022-10-14 | Discharge: 2022-10-14 | Disposition: A | Discharge status: Home / Self Care | Payer: No Typology Code available for payment source | Source: Ambulatory Visit | Attending: Radiation Oncology | Admitting: Radiation Oncology

## 2022-10-14 ENCOUNTER — Other Ambulatory Visit: Payer: Self-pay

## 2022-10-14 VITALS — BP 145/73 | HR 58 | Temp 97.5°F | Resp 18 | Ht 75.0 in | Wt 167.1 lb

## 2022-10-14 DIAGNOSIS — R918 Other nonspecific abnormal finding of lung field: Secondary | ICD-10-CM

## 2022-10-14 DIAGNOSIS — J432 Centrilobular emphysema: Secondary | ICD-10-CM | POA: Diagnosis not present

## 2022-10-14 DIAGNOSIS — I2510 Atherosclerotic heart disease of native coronary artery without angina pectoris: Secondary | ICD-10-CM | POA: Diagnosis not present

## 2022-10-14 DIAGNOSIS — Z923 Personal history of irradiation: Secondary | ICD-10-CM | POA: Diagnosis not present

## 2022-10-14 DIAGNOSIS — I700 Atherosclerosis of aorta: Secondary | ICD-10-CM | POA: Diagnosis not present

## 2022-10-14 DIAGNOSIS — Z7982 Long term (current) use of aspirin: Secondary | ICD-10-CM | POA: Insufficient documentation

## 2022-10-14 DIAGNOSIS — Z7902 Long term (current) use of antithrombotics/antiplatelets: Secondary | ICD-10-CM | POA: Diagnosis not present

## 2022-10-14 DIAGNOSIS — Z85118 Personal history of other malignant neoplasm of bronchus and lung: Secondary | ICD-10-CM | POA: Insufficient documentation

## 2022-10-14 DIAGNOSIS — Z79899 Other long term (current) drug therapy: Secondary | ICD-10-CM | POA: Diagnosis not present

## 2022-10-14 NOTE — Progress Notes (Signed)
David David is here today for follow up post radiation to the lung.  Lung Side: Left and Right,patient completed treatment on 06/06/22.  Does the patient complain of any of the following: Pain:No Shortness of breath w/wo exertion: Yes,mostly on exertion. Patient also reports increased anxiety.  Cough: No Hemoptysis: No Pain with swallowing: No Swallowing/choking concerns: Yes, patient reports having a tight esophagus.  Appetite: Fair, Premier protein as needed.  Energy Level: Low  Post radiation skin Changes: No    Additional comments if applicable:   BP (!) 145/73 (BP Location: Left Arm, Patient Position: Sitting)   Pulse (!) 58   Temp (!) 97.5 F (36.4 C) (Temporal)   Resp 18   Ht 6\' 3"  (1.905 m)   Wt 167 lb 2 oz (75.8 kg)   SpO2 99%   BMI 20.89 kg/m

## 2022-11-11 NOTE — Progress Notes (Unsigned)
Cardiology Office Note   Date:  11/12/2022   ID:  Hakop, Kirt [birth year], MRN [id number]  PCP:  [place], [name], MD  Cardiologist:   Lorine Bears, MD   No chief complaint on file.     History of Present Illness: David David is a 72 y.o. male who presents for a follow-up visit regarding peripheral arterial disease. The patient has lower extremity peripheral arterial disease with recurrent atheroembolic events to the left foot due to distal left popliteal artery stenosis with previous Cypher drug-eluting stent placement which was used in order to treat an ulcerated plaque with distal embolization.  He was seen in 11/2012  for a painless rash involving the left great toe and medial foot, with typical appearance of an athero embolic event. Echocardiogram was unremarkable.  CTA of the chest abdomen and pelvis with runoff to the feet demonstrated scattered atheromatous the aorta but no significant findings.  The left tibioperoneal stent had greater than 70% stenosis. ABI was normal but there was significant dampening in pressure noted in the big toe. I proceeded with angiography which confirmed this and also showed an occluded dorsalis pedis likely from embolization. I performed balloon angioplasty with an Angioscore balloon to both ostial anterior tibial and ostial tibioperoneal trunk.   He had cardiac catheterization in May of 2016 which showed moderate left circumflex stenosis and no evidence of obstructive disease.   He had dizziness and presyncope in the past and thought to have a TIA but neurologic evaluation was overall unremarkable.  He was seen last year due to ischemic right foot.  Angiography was done in August 2023 which showed an occluded right anterior tibial artery which was thought to be subacute and thrombotic.  However, his posterior tibial artery was large and dominant and supplied the pedal arch.  Thus, no revascularization was needed.  He was diagnosed with  adenocarcinoma of the lungs after that and was treated with radiation therapy and finished treatment.  He quit smoking few months ago.  He denies chest pain but has chronic exertional dyspnea.  No lower extremity claudication.   Past Medical History:  Diagnosis Date   Alcoholism in family    Anxiety    Arthritis    Depressive disorder, not elsewhere classified    Diverticulosis of colon (without mention of hemorrhage)    Dysfunction of eustachian tube    Exposure to STD    Family history of diabetes mellitus    Family history of psychiatric condition    family Hx of suicide    GERD (gastroesophageal reflux disease)    History of radiation therapy    Right lung 05/23/22-06/06/22-Dr. Antony Blackbird   Impotence of organic origin    erectile dysfunction   Lipoprotein deficiencies    Other diseases of lung, not elsewhere classified    pulmonary nodule   Peripheral vascular disease, unspecified (HCC)    lower extremity peripheral artery disease   Personal history of colonic polyps    Routine general medical examination at a health care facility    Seizures (HCC)    Sprain of neck    Hx   Thyroid disease    Tobacco use disorder    Unspecified hypertensive kidney disease with chronic kidney disease stage I through stage IV, or unspecified(403.90)    cholesterol embolization syndrome    Past Surgical History:  Procedure Laterality Date   ABDOMINAL AORTOGRAM W/LOWER EXTREMITY N/A 11/07/2021   Procedure: ABDOMINAL AORTOGRAM W/LOWER EXTREMITY;  Surgeon:  Iran Ouch, MD;  Location: [facility] INVASIVE CV LAB;  Service: Cardiovascular;  Laterality: N/A;   angiography     bilateral lower extremity angiography, stenting of the left.    BRONCHIAL BIOPSY  03/21/2022   Procedure: BRONCHIAL BIOPSIES;  Surgeon: Omar Person, MD;  Location: [facility] ENDOSCOPY;  Service: Pulmonary;;   BRONCHIAL BRUSHINGS  03/21/2022   Procedure: BRONCHIAL BRUSHINGS;  Surgeon: Omar Person, MD;  Location: [facility]  ENDOSCOPY;  Service: Pulmonary;;   BRONCHIAL NEEDLE ASPIRATION BIOPSY  03/21/2022   Procedure: BRONCHIAL NEEDLE ASPIRATION BIOPSIES;  Surgeon: Omar Person, MD;  Location: [facility] ENDOSCOPY;  Service: Pulmonary;;   BRONCHIAL WASHINGS  03/21/2022   Procedure: BRONCHIAL WASHINGS;  Surgeon: Omar Person, MD;  Location: [facility] ENDOSCOPY;  Service: Pulmonary;;   CARDIAC CATHETERIZATION N/A 07/19/2014   Procedure: Left Heart Cath and Coronary Angiography;  Surgeon: Marykay Lex, MD;  Location: [facility] INVASIVE CV LAB CUPID;  Service: Cardiovascular;  Laterality: N/A;   CATARACT EXTRACTION Bilateral    EYE SURGERY  2016   blown macular   FIDUCIAL MARKER PLACEMENT  03/21/2022   Procedure: FIDUCIAL MARKER PLACEMENT;  Surgeon: Omar Person, MD;  Location[facility] ENDOSCOPY;  Service: Pulmonary;;   KNEE ARTHROSCOPY Left    x 2   LOWER EXTREMITY ANGIOGRAM N/A 11/04/2012   Procedure: LOWER EXTREMITY ANGIOGRAM;  Surgeon: Iran Ouch, MD;  Location: [facility] CATH LAB;  Service: Cardiovascular;  Laterality: N/A;   POPLITEAL ARTERY ANGIOPLASTY     didnt specify angioplasty or stent   SPERMATOCELECTOMY Left    testicular cyst   VASECTOMY       Current Outpatient Medications  Medication Sig Dispense Refill   albuterol (PROVENTIL HFA;VENTOLIN HFA) 108 (90 Base) MCG/ACT inhaler Inhale 2 puffs into the lungs every 6 (six) hours as needed for wheezing or shortness of breath.     alfuzosin (UROXATRAL) 10 MG 24 hr tablet Take 10 mg by mouth at bedtime.  11   amLODipine (NORVASC) 10 MG tablet Take 5 mg by mouth daily.     ammonium lactate (LAC-HYDRIN) 12 % lotion Apply 1 application topically 2 (two) times daily as needed for dry skin.     aspirin EC 81 MG tablet Take 1 tablet (81 mg total) by mouth daily. 90 tablet 3   clopidogrel (PLAVIX) 75 MG tablet Take 1 tablet (75 mg total) by mouth daily. 30 tablet 3   escitalopram (LEXAPRO) 10 MG tablet Take 30 mg by mouth every morning.      ferrous sulfate 325 (65 FE) MG  tablet Take 325 mg by mouth 3 (three) times a week. Take on Monday, Wednesday, Friday     gabapentin (NEURONTIN) 300 MG capsule Take 600 mg by mouth 2 (two) times daily.     Lactobacillus (ACIDOPHILUS) CAPS capsule Take 1 capsule by mouth daily.     mirtazapine (REMERON) 15 MG tablet Take 1 tablet (15 mg total) by mouth at bedtime. 30 tablet 2   Multiple Vitamin (MULTI-VITAMIN) tablet Take 1 tablet by mouth daily. theragran     nitroGLYCERIN (NITRODUR - DOSED IN MG/24 HR) 0.2 mg/hr patch Place 1 patch (0.2 mg total) onto the skin daily. (Patient taking differently: Place 0.2 mg onto the skin daily as needed (Apply to foot for blood flow).) 30 patch 12   omeprazole (PRILOSEC) 40 MG capsule Take 40 mg by mouth daily.     pravastatin (PRAVACHOL) 40 MG tablet Take 40 mg by mouth at bedtime.  sucralfate (CARAFATE) 1 g tablet Take 1 tablet (1 g total) by mouth 4 (four) times daily -  with meals and at bedtime. Crush and dissolve in 10 mL's of warm water prior to swallowing, take 20 min before meals 60 tablet 1   Tiotropium Bromide Monohydrate (SPIRIVA RESPIMAT) 2.5 MCG/ACT AERS Inhale 2 puffs into the lungs daily. 4 g 6   Omega-3 Fatty Acids (FISH OIL) 1000 MG CAPS Take 1,000 mg by mouth 2 (two) times daily.  (Patient not taking: Reported on 11/12/2022)     zolpidem (AMBIEN) 10 MG tablet Take 10 mg by mouth at bedtime as needed for sleep. (Patient not taking: Reported on 11/12/2022)     Current Facility-Administered Medications  Medication Dose Route Frequency Provider Last Rate Last Admin   0.9 %  sodium chloride infusion  500 mL Intravenous Continuous Hilarie Fredrickson, MD       sodium chloride flush (NS) 0.9 % injection 3 mL  3 mL Intravenous Q12H Iran Ouch, MD        Allergies:   Wellbutrin [bupropion], Flomax [tamsulosin], Ritalin [methylphenidate], Tape, Trazodone, Neomycin-bacitracin zn-polymyx, Niacin, and Paroxetine    Social History:  The patient  reports that he has quit smoking. His  smoking use included cigarettes. He has a 50 pack-year smoking history. He has never used smokeless tobacco. He reports current alcohol use. He reports current drug use. Drug: Marijuana.   Family History:  The patient's family history includes Alzheimer's disease in his brother and sister; CAD in his father; Congestive Heart Failure in his mother; Diabetes in his brother; Lung cancer in his brother.    ROS:  Please see the history of present illness.   Otherwise, review of systems are positive for none.   All other systems are reviewed and negative.    PHYSICAL EXAM: VS:  BP 126/74   Pulse 61   Ht 6\' 3"  (1.905 m)   Wt 166 lb 12.8 oz (75.7 kg)   SpO2 99%   BMI 20.85 kg/m  , BMI Body mass index is 20.85 kg/m. GEN: Well nourished, well developed, in no acute distress  HEENT: normal  Neck: no JVD, carotid bruits, or masses Cardiac: RRR; no murmurs, rubs, or gallops,no edema  Respiratory:  clear to auscultation bilaterally, normal work of breathing GI: soft, nontender, nondistended, + BS MS: no deformity or atrophy  Skin: warm and dry, no rash Neuro:  Strength and sensation are intact Psych: euthymic mood, full affect Vascular: Dorsalis pedis: +2 on the left.  Posterior tibial is +1.  On the right, the dorsalis pedis is absent but has normal posterior tibial pulse.  no ulceration   EKG:  EKG is not ordered today.   Recent Labs: 03/21/2022: BUN 10; Creatinine, Ser 0.90; Hemoglobin 14.4; Platelets 182; Potassium 3.9; Sodium 139    Lipid Panel    Component Value Date/Time   CHOL 126 04/17/2017 0344   TRIG 150 (H) 04/17/2017 0344   HDL 36 (L) 04/17/2017 0344   CHOLHDL 3.5 04/17/2017 0344   VLDL 30 04/17/2017 0344   LDLCALC 60 04/17/2017 0344      Wt Readings from Last 3 Encounters:  11/12/22 166 lb 12.8 oz (75.7 kg)  10/14/22 167 lb 2 oz (75.8 kg)  08/19/22 171 lb (77.6 kg)          No data to display            ASSESSMENT AND PLAN:  1.  Peripheral arterial  disease: Previous  lower extremity intervention on the left side.  Occluded right anterior tibial artery.  Currently with no claudication, rest pain or ulceration.  Continue lifelong dual antiplatelet therapy as tolerated.  I requested a follow-up ABI lower extremity arterial duplex.   2. Tobacco use: I congratulated him on smoking cessation.    3. Essential hypertension: Blood pressure is well controlled.  4. Hyperlipidemia: Continue treatment with pravastatin with a target LDL of less than 70.    Most recent LDL was 60.  5.  lung cancer: Status postradiation therapy.    Disposition:   Follow-up in 6 months. Signed,  Lorine Bears, MD  11/12/2022 10:00 AM    [facility]

## 2022-11-12 ENCOUNTER — Encounter: Payer: Self-pay | Admitting: Cardiovascular Disease

## 2022-11-12 ENCOUNTER — Ambulatory Visit
Payer: No Typology Code available for payment source | Attending: Cardiovascular Disease | Admitting: Cardiovascular Disease

## 2022-11-12 VITALS — BP 126/74 | HR 61 | Ht 75.0 in | Wt 166.8 lb

## 2022-11-12 DIAGNOSIS — Z720 Tobacco use: Secondary | ICD-10-CM | POA: Diagnosis not present

## 2022-11-12 DIAGNOSIS — I10 Essential (primary) hypertension: Secondary | ICD-10-CM | POA: Diagnosis not present

## 2022-11-12 DIAGNOSIS — E785 Hyperlipidemia, unspecified: Secondary | ICD-10-CM

## 2022-11-12 DIAGNOSIS — I739 Peripheral vascular disease, unspecified: Secondary | ICD-10-CM

## 2022-11-12 NOTE — Patient Instructions (Signed)
Medication Instructions:  No changes *If you need a refill on your cardiac medications before your next appointment, please call your pharmacy*   Lab Work: None ordered If you have labs (blood work) drawn today and your tests are completely normal, you will receive your results only by: MyChart Message (if you have MyChart) OR A paper copy in the mail If you have any lab test that is abnormal or we need to change your treatment, we will call you to review the results.   Testing/Procedures: Your physician has requested that you have a lower extremity arterial duplex. During this test, ultrasound is used to evaluate arterial blood flow in the legs. Allow one hour for this exam. There are no restrictions or special instructions. This will take place at [address], [address].  Your physician has requested that you have an ankle brachial index (ABI). During this test an ultrasound and blood pressure cuff are used to evaluate the arteries that supply the arms and legs with blood. Allow thirty minutes for this exam. There are no restrictions or special instructions. This will take place at [address] [facility], [address].      Follow-Up: At [facility], you and your health needs are our priority.  As part of our continuing mission to provide you with exceptional heart care, we have created designated Provider Care Teams.  These Care Teams include your primary Cardiologist (physician) and Advanced Practice Providers (APPs -  Physician Assistants and Nurse Practitioners) who all work together to provide you with the care you need, when you need it.  We recommend signing up for the patient portal called "MyChart".  Sign up information is provided on this After Visit Summary.  MyChart is used to connect with patients for Virtual Visits (Telemedicine).  Patients are able to view lab/test results, encounter notes, upcoming appointments, etc.  Non-urgent messages can be sent to your  provider as well.   To learn more about what you can do with MyChart, go to [email or web address].    Your next appointment:   6 month(s)  Provider:   Lorine Bears, MD

## 2022-11-26 ENCOUNTER — Ambulatory Visit (HOSPITAL_COMMUNITY)
Admission: RE | Admit: 2022-11-26 | Discharge: 2022-11-26 | Disposition: A | Discharge status: Home / Self Care | Payer: No Typology Code available for payment source | Source: Ambulatory Visit | Attending: Cardiology | Admitting: Cardiology

## 2022-11-26 DIAGNOSIS — I739 Peripheral vascular disease, unspecified: Secondary | ICD-10-CM

## 2022-12-01 LAB — VAS US ABI WITH/WO TBI
Left ABI: 0.97
Right ABI: 1.14

## 2022-12-04 ENCOUNTER — Ambulatory Visit
Admission: EM | Admit: 2022-12-04 | Discharge: 2022-12-04 | Disposition: A | Discharge status: Home / Self Care | Payer: Medicare Other | Attending: Nurse Practitioner | Admitting: Nurse Practitioner

## 2022-12-04 ENCOUNTER — Other Ambulatory Visit: Payer: Self-pay

## 2022-12-04 ENCOUNTER — Ambulatory Visit: Payer: No Typology Code available for payment source | Admitting: Nurse Practitioner

## 2022-12-04 DIAGNOSIS — R109 Unspecified abdominal pain: Secondary | ICD-10-CM | POA: Diagnosis not present

## 2022-12-04 LAB — POCT URINALYSIS DIP (MANUAL ENTRY)
Bilirubin, UA: NEGATIVE
Glucose, UA: NEGATIVE mg/dL
Ketones, POC UA: NEGATIVE mg/dL
Leukocytes, UA: NEGATIVE
Nitrite, UA: NEGATIVE
Protein Ur, POC: NEGATIVE mg/dL
Spec Grav, UA: 1.02 (ref 1.010–1.025)
Urobilinogen, UA: 0.2 U/dL
pH, UA: 5.5 (ref 5.0–8.0)

## 2022-12-04 MED ORDER — ALFUZOSIN HCL ER 10 MG PO TB24: 10.0000 mg | ORAL_TABLET | Freq: Every day | ORAL | 0 refills | Status: AC

## 2022-12-04 MED ORDER — IBUPROFEN 800 MG PO TABS: 800.0000 mg | ORAL_TABLET | Freq: Three times a day (TID) | ORAL | 0 refills | Status: AC

## 2022-12-04 NOTE — ED Triage Notes (Addendum)
Pt states right groin pain radiating to lower back for the past 5 days.  States he is also having slow start when he urinates. States he took Tylenol at home with no relief

## 2022-12-04 NOTE — ED Provider Notes (Signed)
[facility]    CSN: [id number] Arrival date & time: 12/04/22  0945      History   Chief Complaint Chief Complaint  Patient presents with   Groin Pain    HPI David David is a 72 y.o. male.   The history is provided by the patient and a relative (Daughter).   Patient presents for complaints of pain in the right groin and to the right lower back that has been present for the past 5 days.  Patient also states that his urine stream is weak, and states "my urine goes everywhere".  Patient states the pain wraps around from right groin area into the right lower back.  Patient denies fever, chills, chest pain, abdominal pain, nausea, vomiting, diarrhea, dysuria, hematuria, penile discharge, testicular/scrotal pain/swelling, or low back pain.  Patient states his last bowel movement was 1 day ago.  He does report a history of constipation.  Patient's daughter informs patient is currently under treatment for lung cancer.  Patient reports he has been taking Tylenol for his symptoms with minimal relief.  Patient denies history of recurrent UTI or kidney stones. Past Medical History:  Diagnosis Date   Alcoholism in family    Anxiety    Arthritis    Depressive disorder, not elsewhere classified    Diverticulosis of colon (without mention of hemorrhage)    Dysfunction of eustachian tube    Exposure to STD    Family history of diabetes mellitus    Family history of psychiatric condition    family Hx of suicide    GERD (gastroesophageal reflux disease)    History of radiation therapy    Right lung 05/23/22-06/06/22-Dr. Antony Blackbird   Impotence of organic origin    erectile dysfunction   Lipoprotein deficiencies    Other diseases of lung, not elsewhere classified    pulmonary nodule   Peripheral vascular disease, unspecified (HCC)    lower extremity peripheral artery disease   Personal history of colonic polyps    Routine general medical examination at a health care facility     Seizures [facility])    Sprain of neck    Hx   Thyroid disease    Tobacco use disorder    Unspecified hypertensive kidney disease with chronic kidney disease stage I through stage IV, or unspecified(403.90)    cholesterol embolization syndrome    Patient Active Problem List   Diagnosis Date Noted   Pulmonary nodules 03/21/2022   Near syncope 04/17/2017   TIA (transient ischemic attack) 04/16/2017   Arthritis of carpometacarpal (CMC) joint of left thumb 07/04/2016   COPD (chronic obstructive pulmonary disease) (HCC) 07/01/2016   Family history of first degree relative with dementia 03/30/2015   Neuropathy 03/30/2015   Memory loss 02/21/2015   Essential hypertension 07/19/2014   Hyperlipidemia 07/19/2014   Hypothyroidism 07/19/2014   Malnutrition of moderate degree (HCC) 07/19/2014   Unstable angina (HCC)    PAD (peripheral artery disease) (HCC)    TOBACCO ABUSE 09/27/2008   CHOLESTEROL EMBOLIZATION SYNDROME 09/27/2008   PERIPHERAL VASCULAR DISEASE 09/27/2008   GERD 09/27/2008   DYSFUNCTION, EUSTACHIAN TUBE 10/09/2006   LOW HDL 07/28/2006   Depression, major, in partial remission (HCC) 07/28/2006   PULMONARY NODULE 07/28/2006   DIVERTICULOSIS, COLON 07/28/2006   ERECTILE DYSFUNCTION, ORGANIC 07/28/2006   SPRAIN/STRAIN, NECK 07/28/2006   COLONIC POLYPS, HX OF 07/28/2006    Past Surgical History:  Procedure Laterality Date   ABDOMINAL AORTOGRAM W/LOWER EXTREMITY N/A 11/07/2021   Procedure: ABDOMINAL  AORTOGRAM W/LOWER EXTREMITY;  Surgeon: Iran Ouch, MD;  Location: [facility] INVASIVE CV LAB;  Service: Cardiovascular;  Laterality: N/A;   angiography     bilateral lower extremity angiography, stenting of the left.    BRONCHIAL BIOPSY  03/21/2022   Procedure: BRONCHIAL BIOPSIES;  Surgeon: Omar Person, MD;  Location: [facility] ENDOSCOPY;  Service: Pulmonary;;   BRONCHIAL BRUSHINGS  03/21/2022   Procedure: BRONCHIAL BRUSHINGS;  Surgeon: Omar Person, MD;  Location: [facility] ENDOSCOPY;   Service: Pulmonary;;   BRONCHIAL NEEDLE ASPIRATION BIOPSY  03/21/2022   Procedure: BRONCHIAL NEEDLE ASPIRATION BIOPSIES;  Surgeon: Omar Person, MD;  Location: [facility] ENDOSCOPY;  Service: Pulmonary;;   BRONCHIAL WASHINGS  03/21/2022   Procedure: BRONCHIAL WASHINGS;  Surgeon: Omar Person, MD;  Location: [name] ENDOSCOPY;  Service: Pulmonary;;   CARDIAC CATHETERIZATION N/A 07/19/2014   Procedure: Left Heart Cath and Coronary Angiography;  Surgeon: Marykay Lex, MD;  Location: [facility] INVASIVE CV LAB CUPID;  Service: Cardiovascular;  Laterality: N/A;   CATARACT EXTRACTION Bilateral    EYE SURGERY  2016   blown macular   FIDUCIAL MARKER PLACEMENT  03/21/2022   Procedure: FIDUCIAL MARKER PLACEMENT;  Surgeon: Omar Person, MD;  Location: [facility] ENDOSCOPY;  Service: Pulmonary;;   KNEE ARTHROSCOPY Left    x 2   LOWER EXTREMITY ANGIOGRAM N/A 11/04/2012   Procedure: LOWER EXTREMITY ANGIOGRAM;  Surgeon: Iran Ouch, MD;  Location: [facility] CATH LAB;  Service: Cardiovascular;  Laterality: N/A;   POPLITEAL ARTERY ANGIOPLASTY     didnt specify angioplasty or stent   SPERMATOCELECTOMY Left    testicular cyst   VASECTOMY         Home Medications    Prior to Admission medications   Medication Sig Start Date End Date Taking? Authorizing Provider  alfuzosin (UROXATRAL) 10 MG 24 hr tablet Take 1 tablet (10 mg total) by mouth at bedtime. 12/04/22  Yes Damione Robideau-Warren, Sadie Haber, NP  ibuprofen (ADVIL) 800 MG tablet Take 1 tablet (800 mg total) by mouth 3 (three) times daily. 12/04/22  Yes Dannya Pitkin-Warren, Sadie Haber, NP  albuterol (PROVENTIL HFA;VENTOLIN HFA) 108 (90 Base) MCG/ACT inhaler Inhale 2 puffs into the lungs every 6 (six) hours as needed for wheezing or shortness of breath.    [provider]  amLODipine (NORVASC) 10 MG tablet Take 5 mg by mouth daily.    [provider]  ammonium lactate (LAC-HYDRIN) 12 % lotion Apply 1 application topically 2 (two) times daily as needed for dry skin.     [provider]  aspirin EC 81 MG tablet Take 1 tablet (81 mg total) by mouth daily. 10/09/12   Tonny Bollman, MD  clopidogrel (PLAVIX) 75 MG tablet Take 1 tablet (75 mg total) by mouth daily. 11/04/12   Iran Ouch, MD  escitalopram (LEXAPRO) 10 MG tablet Take 30 mg by mouth every morning.  10/28/12   Tonny Bollman, MD  ferrous sulfate 325 (65 FE) MG tablet Take 325 mg by mouth 3 (three) times a week. Take on Monday, Wednesday, Friday    [provider]  gabapentin (NEURONTIN) 300 MG capsule Take 600 mg by mouth 2 (two) times daily.    [provider]  Lactobacillus (ACIDOPHILUS) CAPS capsule Take 1 capsule by mouth daily.    [provider]  mirtazapine (REMERON) 15 MG tablet Take 1 tablet (15 mg total) by mouth at bedtime. 10/28/16   [place], [name], MD  Multiple Vitamin (MULTI-VITAMIN) tablet Take 1 tablet by mouth  daily. theragran    [provider]  nitroGLYCERIN (NITRODUR - DOSED IN MG/24 HR) 0.2 mg/hr patch Place 1 patch (0.2 mg total) onto the skin daily. Patient taking differently: Place 0.2 mg onto the skin daily as needed (Apply to foot for blood flow). 11/08/21   Nadara Mustard, MD  Omega-3 Fatty Acids (FISH OIL) 1000 MG CAPS Take 1,000 mg by mouth 2 (two) times daily.  Patient not taking: Reported on 11/12/2022    [provider]  omeprazole (PRILOSEC) 40 MG capsule Take 40 mg by mouth daily.    [provider]  pravastatin (PRAVACHOL) 40 MG tablet Take 40 mg by mouth at bedtime.    [provider]  sucralfate (CARAFATE) 1 g tablet Take 1 tablet (1 g total) by mouth 4 (four) times daily -  with meals and at bedtime. Crush and dissolve in 10 mL's of warm water prior to swallowing, take 20 min before meals 06/10/22   Antony Blackbird, MD  Tiotropium Bromide Monohydrate (SPIRIVA RESPIMAT) 2.5 MCG/ACT AERS Inhale 2 puffs into the lungs daily. 04/09/22   Omar Person, MD  zolpidem (AMBIEN) 10 MG tablet Take 10 mg  by mouth at bedtime as needed for sleep. Patient not taking: Reported on 11/12/2022 11/27/21   [provider]    Family History Family History  Problem Relation Age of Onset   Congestive Heart Failure Mother    CAD Father    Alzheimer's disease Sister    Lung cancer Brother        smoking hx   Alzheimer's disease Brother    Diabetes Brother    Colon cancer Neg Hx    Stomach cancer Neg Hx    Esophageal cancer Neg Hx    Colon polyps Neg Hx     Social History Social History   Tobacco Use   Smoking status: Former    Current packs/day: 1.00    Average packs/day: 1 pack/day for 50.0 years (50.0 ttl pk-yrs)    Types: Cigarettes   Smokeless tobacco: Never   Tobacco comments:    Cutting back   Vaping Use   Vaping status: Former  Substance Use Topics   Alcohol use: Yes    Alcohol/week: 0.0 standard drinks of alcohol    Comment: social   Drug use: Yes    Types: Marijuana    Comment: Last use was on 02/2022     Allergies   Wellbutrin [bupropion], Flomax [tamsulosin], Ritalin [methylphenidate], Tape, Trazodone, Neomycin-bacitracin zn-polymyx, Niacin, and Paroxetine   Review of Systems Review of Systems Per HPI  Physical Exam Triage Vital Signs ED Triage Vitals  Encounter Vitals Group     BP 12/04/22 0959 128/79     Systolic BP Percentile --      Diastolic BP Percentile --      Pulse Rate 12/04/22 0959 77     Resp 12/04/22 0959 16     Temp 12/04/22 0959 98.4 F (36.9 C)     Temp Source 12/04/22 0959 Oral     SpO2 12/04/22 0959 95 %     Weight --      Height --      Head Circumference --      Peak Flow --      Pain Score 12/04/22 1001 8     Pain Loc --      Pain Education --      Exclude from Growth Chart --    No data found.  Updated Vital  Signs BP 128/79 (BP Location: Right Arm)   Pulse 77   Temp 98.4 F (36.9 C) (Oral)   Resp 16   SpO2 95%   Visual Acuity Right Eye Distance:   Left Eye Distance:   Bilateral Distance:    Right Eye  Near:   Left Eye Near:    Bilateral Near:     Physical Exam Vitals and nursing note reviewed.  Constitutional:      General: He is not in acute distress.    Appearance: Normal appearance.  HENT:     Head: Normocephalic.  Eyes:     Extraocular Movements: Extraocular movements intact.     Pupils: Pupils are equal, round, and reactive to light.  Cardiovascular:     Rate and Rhythm: Normal rate and regular rhythm.     Pulses: Normal pulses.     Heart sounds: Normal heart sounds.  Pulmonary:     Effort: Pulmonary effort is normal. No respiratory distress.     Breath sounds: Normal breath sounds. No stridor. No wheezing, rhonchi or rales.  Abdominal:     General: Bowel sounds are normal.     Palpations: Abdomen is soft.     Tenderness: There is no abdominal tenderness. There is no right CVA tenderness or left CVA tenderness.  Musculoskeletal:     Cervical back: Normal range of motion.  Lymphadenopathy:     Cervical: No cervical adenopathy.  Skin:    General: Skin is warm and dry.  Neurological:     General: No focal deficit present.     Mental Status: He is alert and oriented to person, place, and time.  Psychiatric:        Mood and Affect: Mood normal.        Behavior: Behavior normal.      UC Treatments / Results  Labs (all labs ordered are listed, but only abnormal results are displayed) Labs Reviewed  POCT URINALYSIS DIP (MANUAL ENTRY) - Abnormal; Notable for the following components:      Result Value   Blood, UA trace-intact (*)    All other components within normal limits  URINE CULTURE    EKG   Radiology No results found.  Procedures Procedures (including critical care time)  Medications Ordered in UC Medications - No data to display  Initial Impression / Assessment and Plan / UC Course  I have reviewed the triage vital signs and the nursing notes.  Pertinent labs & imaging results that were available during my care of the patient were reviewed by  me and considered in my medical decision making (see chart for details).  The patient is well-appearing, he is in no acute distress, vital signs are stable.  Urinalysis does show trace blood.  Urine culture is pending.  On exam, he does not exhibit any abdominal tenderness, or CVA tenderness.  Based on his symptoms and urinalysis, symptoms appear to be consistent with possible kidney stones.  Will treat patient with Uroxatrol 10 mg tablets (patient does have an allergy to tamsulosin) to increase urinary flow, and ibuprofen 800 mg tablets for pain (review chart for most recent creatinine, 03/21/2022 0.9, GFR greater than 60).  Supportive care recommendations were provided and discussed with the patient to include advising patient to take ibuprofen when pain is severe, recommend that he continue Tylenol at this time, increasing his fluid intake, recommend he drink at least 8-10 8 ounce glasses of water while symptoms persist, and straining his urine.  Discussion with patient that  it is recommended that he follow-up with urology.  He states that he does see urology for previous conditions with his testicles.  Patient was given ER follow-up precautions.  Patient and daughter are in agreement with this plan of care and verbalized understanding.  All questions were answered.  Patient stable for discharge..    Final Clinical Impressions(s) / UC Diagnoses   Final diagnoses:  Right flank pain     Discharge Instructions      The urinalysis does not indicate a urinary tract infection.  A urine culture is pending.  You will be contacted if the results of the urine culture are positive.  You will also have access to your results via MyChart. Take medication as prescribed. Continue Tylenol as needed for pain or discomfort.  Recommend Tylenol arthritis strength 650 mg tablet every 8 hours.  For severe or breakthrough pain, you may take the ibuprofen.  Make sure you are taking the ibuprofen with food and  water. Strain each urine. As discussed, if you experience severe pain not controlled with Tylenol or ibuprofen, or other concerns please follow-up in the emergency department immediately for further evaluation. Please follow-up with urology within the next 2 to 3 weeks or sooner if symptoms fail to improve. Follow-up as needed.     ED Prescriptions     Medication Sig Dispense Auth. Provider   alfuzosin (UROXATRAL) 10 MG 24 hr tablet Take 1 tablet (10 mg total) by mouth at bedtime. 30 tablet Keilee Denman-Warren, Sadie Haber, NP   ibuprofen (ADVIL) 800 MG tablet Take 1 tablet (800 mg total) by mouth 3 (three) times daily. 21 tablet Karess Harner-Warren, Sadie Haber, NP      PDMP not reviewed this encounter.   Abran Cantor, NP 12/04/22 1048

## 2022-12-04 NOTE — Discharge Instructions (Addendum)
The urinalysis does not indicate a urinary tract infection.  A urine culture is pending.  You will be contacted if the results of the urine culture are positive.  You will also have access to your results via MyChart. Take medication as prescribed. Continue Tylenol as needed for pain or discomfort.  Recommend Tylenol arthritis strength 650 mg tablet every 8 hours.  For severe or breakthrough pain, you may take the ibuprofen.  Make sure you are taking the ibuprofen with food and water. Strain each urine. As discussed, if you experience severe pain not controlled with Tylenol or ibuprofen, or other concerns please follow-up in the emergency department immediately for further evaluation. Please follow-up with urology within the next 2 to 3 weeks or sooner if symptoms fail to improve. Follow-up as needed.

## 2022-12-05 LAB — URINE CULTURE: Culture: NO GROWTH

## 2022-12-11 ENCOUNTER — Other Ambulatory Visit (HOSPITAL_COMMUNITY): Payer: Self-pay | Admitting: *Deleted

## 2022-12-11 DIAGNOSIS — I739 Peripheral vascular disease, unspecified: Secondary | ICD-10-CM

## 2022-12-24 ENCOUNTER — Other Ambulatory Visit: Payer: Self-pay

## 2022-12-24 ENCOUNTER — Telehealth: Payer: Self-pay

## 2022-12-24 MED ORDER — NITROGLYCERIN 0.2 MG/HR TD PT24: 0.2000 mg | MEDICATED_PATCH | Freq: Every day | TRANSDERMAL | 3 refills | Status: AC | PRN

## 2022-12-24 NOTE — Telephone Encounter (Signed)
Pt called and requested a refill on nitroglycerine patch ok per Dr. Lajoyce Corners to send to pharmacy. Pt is aware refill sent in.

## 2023-01-15 ENCOUNTER — Ambulatory Visit: Payer: Medicare Other | Admitting: Urology

## 2023-01-20 ENCOUNTER — Encounter: Payer: Self-pay | Admitting: Urology

## 2023-01-20 ENCOUNTER — Ambulatory Visit (INDEPENDENT_AMBULATORY_CARE_PROVIDER_SITE_OTHER): Payer: No Typology Code available for payment source | Admitting: Nurse Practitioner

## 2023-01-20 ENCOUNTER — Ambulatory Visit: Payer: Non-veteran care | Admitting: Urology

## 2023-01-20 ENCOUNTER — Encounter: Payer: Self-pay | Admitting: Nurse Practitioner

## 2023-01-20 VITALS — BP 157/71 | HR 74

## 2023-01-20 VITALS — BP 140/82 | HR 68 | Temp 98.0°F | Ht 75.0 in | Wt 179.8 lb

## 2023-01-20 DIAGNOSIS — N210 Calculus in bladder: Secondary | ICD-10-CM | POA: Diagnosis not present

## 2023-01-20 DIAGNOSIS — N486 Induration penis plastica: Secondary | ICD-10-CM

## 2023-01-20 DIAGNOSIS — R918 Other nonspecific abnormal finding of lung field: Secondary | ICD-10-CM | POA: Diagnosis not present

## 2023-01-20 DIAGNOSIS — J449 Chronic obstructive pulmonary disease, unspecified: Secondary | ICD-10-CM | POA: Diagnosis not present

## 2023-01-20 DIAGNOSIS — N200 Calculus of kidney: Secondary | ICD-10-CM | POA: Insufficient documentation

## 2023-01-20 DIAGNOSIS — N400 Enlarged prostate without lower urinary tract symptoms: Secondary | ICD-10-CM | POA: Diagnosis not present

## 2023-01-20 DIAGNOSIS — C3491 Malignant neoplasm of unspecified part of right bronchus or lung: Secondary | ICD-10-CM | POA: Diagnosis not present

## 2023-01-20 DIAGNOSIS — F17200 Nicotine dependence, unspecified, uncomplicated: Secondary | ICD-10-CM

## 2023-01-20 DIAGNOSIS — N529 Male erectile dysfunction, unspecified: Secondary | ICD-10-CM | POA: Diagnosis not present

## 2023-01-20 DIAGNOSIS — C3492 Malignant neoplasm of unspecified part of left bronchus or lung: Secondary | ICD-10-CM | POA: Diagnosis not present

## 2023-01-20 NOTE — Progress Notes (Signed)
@Patient  ID: David David, male    DOB: [birth year], 72 y.o.   MRN: [id number]  Chief Complaint  Patient presents with   Follow-up    Cough and occasional wheezing at night persistent.    Referring provider: [place], [name], MD  HPI: 72 year old male, former smoker followed for COPD, multiple pulmonary nodules. He has a history of stage I adenocarcinoma of lung with synchronous primaries. He is a former patient of Dr. Lind Guest and last seen in office 04/09/2022. Past medical history significant for HTN, PAD, hx of TIA, GERD, hypothyroid, ED, HLD.   TEST/EVENTS:  04/09/2022 PFT: FVC 87, FEV1 80, ratio 68, TLC 89, DLCO 46. Moderate obstruction without reversibility and severe diffusing defect 10/10/2022 CT chest: severe emphysema. New post treatment changes. RML nodules, 4 mm and 7 mm, unchanged. Additional small nodules unchanged.   04/09/2022: OV with Dr. Thora Lance. Underwent bronch 1/4 - suspected synchronous primaries both stage I NSCLC. Followed by Dr. Elby Showers with oncology at [facility]. Sees Dr. Roselind Messier 1/29 to discuss SBRT to left lingular primary and option of XRT vs resection for RUL primary. Continue Spiriva for COPD management. Smoking cessation.   01/20/2023: Today - follow up Discussed the use of AI scribe software for clinical note transcription with the patient, who gave verbal consent to proceed.  History of Present Illness   The patient, with a history of lung cancer and COPD, underwent radiation therapy to both lungs, with five treatments to the left and three to the right. The decision for radiation over resection was made due to lung disease and high likelihood of lifelong oxygen dependency post op. The most recent CT scan in July showed stable disease with no evidence of cancer recurrence. He is following with [facility] oncology, Dr. Elby Showers, and [facility] radiation oncology, Dr. Roselind Messier. No hemoptysis, anorexia, weight loss, night sweats.  The patient reports satisfactory control of  COPD symptoms, with no need for antibiotics or steroids for breathing issues in the past year. He is currently on Spiriva. Rarely uses albuterol; maybe 1-2 times a month. He does occasionally have some chest tightness, particularly when lying down to sleep at night. The patient also reports occasional cough, producing mostly clear sputum. Not much wheezing. No leg swelling, orthopnea, PND.   In addition to respiratory issues, the patient has been experiencing urinary problems, which were attributed to kidney stones. He reports having about thirty kidney stones and is scheduled to see a urologist for potential intervention later today.  The patient also mentions a decrease in activity level over the last 5-10 years, which he attributes to a combination of his lung condition and aging. Mostly notices it outside in the yard. He expresses a desire to remain as active as possible.   His mood has been doing okay since he was here last. He does have a brother-in-law, recently diagnosed with stage 4 lung cancer, which has caused some stress. He expresses a positive outlook on his health situation, focusing on maintaining a good quality of life rather than dwelling on his illness.       Allergies  Allergen Reactions   Wellbutrin [Bupropion] Other (See Comments)    Possible seizure    Flomax [Tamsulosin] Swelling   Ritalin [Methylphenidate] Other (See Comments)    Unknown reaction - too strong?   Tape Other (See Comments)    "Plastic" tape TEARS THE SKIN; please use paper tape or Coban wrap!!   Trazodone Other (See Comments)  To strong   Neomycin-Bacitracin Zn-Polymyx Rash   Niacin     Flushing    Paroxetine Other (See Comments)    Makes the patient feel SEVERELY SEDATED/LETHARGIC/CANNOT SPEAK WORDS Lethargy (intolerance) REACTION: goofy    Immunization History  Administered Date(s) Administered   Fluad Quad(high Dose 65+) 02/03/2020, 02/13/2021, 02/04/2022   Influenza Split 02/15/2013    Influenza, High Dose Seasonal PF 12/19/2015, 11/16/2016, 12/31/2016, 11/30/2018   Influenza-Unspecified 01/21/2007, 11/24/2007, 11/28/2009, 01/21/2011, 12/22/2013, 02/05/2022   Moderna Covid-19 Fall Seasonal Vaccine 68yrs & older 02/05/2022   Moderna Sars-Covid-2 Vaccination 04/16/2019, 05/14/2019, 02/17/2020, 08/10/2020, 02/04/2022   Pneumococcal Conjugate-13 02/15/2013, 03/01/2013   Pneumococcal Polysaccharide-23 12/22/2013, 01/11/2019   Pneumococcal-Unspecified 05/20/2007   Td (Adult),5 Lf Tetanus Toxid, Preservative Free 08/06/2021   Tdap 09/12/2011   Zoster Recombinant(Shingrix) 08/10/2019, 02/03/2020   Zoster, Live 01/21/2011    Past Medical History:  Diagnosis Date   Alcoholism in family    Anxiety    Arthritis    Depressive disorder, not elsewhere classified    Diverticulosis of colon (without mention of hemorrhage)    Dysfunction of eustachian tube    Exposure to STD    Family history of diabetes mellitus    Family history of psychiatric condition    family Hx of suicide    GERD (gastroesophageal reflux disease)    History of radiation therapy    Right lung 05/23/22-06/06/22-Dr. Antony Blackbird   Impotence of organic origin    erectile dysfunction   Lipoprotein deficiencies    Other diseases of lung, not elsewhere classified    pulmonary nodule   Peripheral vascular disease, unspecified (HCC)    lower extremity peripheral artery disease   Personal history of colonic polyps    Routine general medical examination at a health care facility    Seizures (HCC)    Sprain of neck    Hx   Thyroid disease    Tobacco use disorder    Unspecified hypertensive kidney disease with chronic kidney disease stage I through stage IV, or unspecified(403.90)    cholesterol embolization syndrome    Tobacco History: Social History   Tobacco Use  Smoking Status Former   Current packs/day: 1.00   Average packs/day: 1 pack/day for 50.0 years (50.0 ttl pk-yrs)   Types: Cigarettes   Smokeless Tobacco Never  Tobacco Comments   Cutting back    Counseling given: Not Answered Tobacco comments: Cutting back    Outpatient Medications Prior to Visit  Medication Sig Dispense Refill   albuterol (PROVENTIL HFA;VENTOLIN HFA) 108 (90 Base) MCG/ACT inhaler Inhale 2 puffs into the lungs every 6 (six) hours as needed for wheezing or shortness of breath.     alfuzosin (UROXATRAL) 10 MG 24 hr tablet Take 1 tablet (10 mg total) by mouth at bedtime. 30 tablet 0   amLODipine (NORVASC) 10 MG tablet Take 5 mg by mouth daily.     ammonium lactate (LAC-HYDRIN) 12 % lotion Apply 1 application topically 2 (two) times daily as needed for dry skin.     aspirin EC 81 MG tablet Take 1 tablet (81 mg total) by mouth daily. 90 tablet 3   clopidogrel (PLAVIX) 75 MG tablet Take 1 tablet (75 mg total) by mouth daily. 30 tablet 3   escitalopram (LEXAPRO) 10 MG tablet Take 30 mg by mouth every morning.      ferrous sulfate 325 (65 FE) MG tablet Take 325 mg by mouth 3 (three) times a week. Take on Monday, Wednesday, Friday  gabapentin (NEURONTIN) 300 MG capsule Take 600 mg by mouth 2 (two) times daily.     hydrOXYzine (ATARAX) 25 MG tablet Take 25 mg by mouth at bedtime as needed (sleep).     ibuprofen (ADVIL) 800 MG tablet Take 1 tablet (800 mg total) by mouth 3 (three) times daily. 21 tablet 0   Lactobacillus (ACIDOPHILUS) CAPS capsule Take 1 capsule by mouth daily.     mirtazapine (REMERON) 15 MG tablet Take 1 tablet (15 mg total) by mouth at bedtime. 30 tablet 2   Multiple Vitamin (MULTI-VITAMIN) tablet Take 1 tablet by mouth daily. theragran     nitroGLYCERIN (NITRODUR - DOSED IN MG/24 HR) 0.2 mg/hr patch Place 1 patch (0.2 mg total) onto the skin daily as needed (Apply to foot for blood flow). 30 patch 3   Omega-3 Fatty Acids (FISH OIL) 1000 MG CAPS Take 1,000 mg by mouth 2 (two) times daily.     omeprazole (PRILOSEC) 40 MG capsule Take 40 mg by mouth daily.     pravastatin (PRAVACHOL) 40 MG  tablet Take 40 mg by mouth at bedtime.     sucralfate (CARAFATE) 1 g tablet Take 1 tablet (1 g total) by mouth 4 (four) times daily -  with meals and at bedtime. Crush and dissolve in 10 mL's of warm water prior to swallowing, take 20 min before meals 60 tablet 1   Tiotropium Bromide Monohydrate (SPIRIVA RESPIMAT) 2.5 MCG/ACT AERS Inhale 2 puffs into the lungs daily. 4 g 6   zolpidem (AMBIEN) 10 MG tablet Take 10 mg by mouth at bedtime as needed for sleep. (Patient not taking: Reported on 11/12/2022)     Facility-Administered Medications Prior to Visit  Medication Dose Route Frequency Provider Last Rate Last Admin   0.9 %  sodium chloride infusion  500 mL Intravenous Continuous Hilarie Fredrickson, MD       sodium chloride flush (NS) 0.9 % injection 3 mL  3 mL Intravenous Q12H Iran Ouch, MD         Review of Systems:   Constitutional: No weight loss or gain, night sweats, fevers, chills, fatigue, or lassitude. HEENT: No headaches, difficulty swallowing, tooth/dental problems, or sore throat. No sneezing, itching, ear ache, nasal congestion, or post nasal drip CV:  No chest pain, orthopnea, PND, swelling in lower extremities, anasarca, dizziness, palpitations, syncope Resp: +shortness of breath with exertion; daily minimally productive cough. No excess mucus or change in color of mucus. No hemoptysis. No wheezing.  No chest wall deformity GI:  No heartburn, indigestion, abdominal pain, nausea, vomiting, diarrhea, change in bowel habits, loss of appetite, bloody stools.  GU: No dysuria, change in color of urine, urgency, hematuria  Skin: No rash, lesions, ulcerations MSK:  No joint pain or swelling.   Neuro: No dizziness or lightheadedness.  Psych: No depression or anxiety. Mood stable.     Physical Exam:  BP (!) 140/82 (BP Location: Right Arm, Patient Position: Sitting, Cuff Size: Normal)   Pulse 68   Temp 98 F (36.7 C) (Oral)   Ht 6\' 3"  (1.905 m)   Wt 179 lb 12.8 oz (81.6 kg)    SpO2 98%   BMI 22.47 kg/m   GEN: Pleasant, interactive, well-appearing; in no acute distress HEENT:  Normocephalic and atraumatic. PERRLA. Sclera white. Nasal turbinates pink, moist and patent bilaterally. No rhinorrhea present. Oropharynx pink and moist, without exudate or edema. No lesions, ulcerations, or postnasal drip.  NECK:  Supple w/ fair ROM. No JVD present.  Normal carotid impulses w/o bruits. Thyroid symmetrical with no goiter or nodules palpated. No lymphadenopathy.   CV: RRR, no m/r/g, no peripheral edema. Pulses intact, +2 bilaterally. No cyanosis, pallor or clubbing. PULMONARY:  Unlabored, regular breathing. Clear bilaterally A&P w/o wheezes/rales/rhonchi. No accessory muscle use.  GI: BS present and normoactive. Soft, non-tender to palpation. No organomegaly or masses detected. MSK: No erythema, warmth or tenderness. Cap refil <2 sec all extrem. No deformities or joint swelling noted.  Neuro: A/Ox3. No focal deficits noted.   Skin: Warm, no lesions or rashe Psych: Normal affect and behavior. Judgement and thought content appropriate.     Lab Results:  CBC    Component Value Date/Time   WBC 11.1 (H) 03/21/2022 0617   RBC 4.65 03/21/2022 0617   HGB 14.4 03/21/2022 0617   HGB 15.0 11/06/2021 0839   HCT 43.3 03/21/2022 0617   HCT 44.5 11/06/2021 0839   PLT 182 03/21/2022 0617   PLT 162 11/06/2021 0839   MCV 93.1 03/21/2022 0617   MCV 89 11/06/2021 0839   MCH 31.0 03/21/2022 0617   MCHC 33.3 03/21/2022 0617   RDW 14.6 03/21/2022 0617   RDW 14.5 11/06/2021 0839   LYMPHSABS 0.8 06/03/2019 2219   MONOABS 0.5 06/03/2019 2219   EOSABS 0.0 06/03/2019 2219   BASOSABS 0.0 06/03/2019 2219    BMET    Component Value Date/Time   NA 139 03/21/2022 0617   NA 142 11/06/2021 0839   K 3.9 03/21/2022 0617   CL 107 03/21/2022 0617   CO2 22 03/21/2022 0617   GLUCOSE 118 (H) 03/21/2022 0617   BUN 10 03/21/2022 0617   BUN 11 11/06/2021 0839   CREATININE 0.90 03/21/2022 0617    CREATININE 1.02 05/15/2015 1615   CALCIUM 8.9 03/21/2022 0617   GFRNONAA >60 03/21/2022 0617   GFRNONAA 76 05/15/2015 1615   GFRAA >60 06/03/2019 2219   GFRAA 88 05/15/2015 1615    BNP    Component Value Date/Time   BNP 29.2 07/18/2014 2030     Imaging:  No results found.  Administration History     None          Latest Ref Rng & Units 04/09/2022   10:51 AM  PFT Results  FVC-Pre L 4.55   FVC-Predicted Pre % 87   FVC-Post L 4.45   FVC-Predicted Post % 85   Pre FEV1/FVC % % 67   Post FEV1/FCV % % 68   FEV1-Pre L 3.05   FEV1-Predicted Pre % 80   FEV1-Post L 3.03   DLCO uncorrected ml/min/mmHg 13.72   DLCO UNC% % 46   DLCO corrected ml/min/mmHg 13.80   DLCO COR %Predicted % 46   DLVA Predicted % 52   TLC L 7.18   TLC % Predicted % 89   RV % Predicted % 73     No results found for: "NITRICOXIDE"      Assessment & Plan:      Lung Cancer Synchronous stage I primaries. Former heavy smoker. He also has hx of exposure to agent orange. Deemed poor surgical candidate for resection by thoracic surgery. Treated with IMRT (five sessions to the left lung, three to the right). Last CT in July showed without recurrence. Upcoming CT on Thursday at the [place] with Dr. Elby Showers, oncologist.  - Perform CT scan on Thursday - Follow up with oncologist post-CT to discuss results and future monitoring - Follow up wtih Dr. Roselind Messier as scheduled   Chronic Obstructive Pulmonary Disease (COPD)  Compensated COPD with moderate symptom burden. Rare use of albuterol inhaler. No recent exacerbations. Advised him to trial albuterol PRN for nocturnal symptoms and notify of frequent use so we can consider step up in therapy. Action plan in place - Continue Spiriva - Use albuterol inhaler as needed - Refer to pulmonary rehab - Follow up in six months unless symptoms worsen  Kidney Stones Multiple kidney stones. Appointment with urology today  Follow-up - Follow up with oncology post-CT  scan - Attend appointment with urology for kidney stones - Follow up in six months for COPD management.     If symptoms do not improve or worsen, please contact office for sooner follow up or seek emergency care.   I spent 35 minutes of dedicated to the care of this patient on the date of this encounter to include pre-visit review of records, face-to-face time with the patient discussing conditions above, post visit ordering of testing, clinical documentation with the electronic health record, making appropriate referrals as documented, and communicating necessary findings to members of the patients care team.  Noemi Chapel, NP 01/20/2023  Pt aware and understands NP's role.

## 2023-01-20 NOTE — Progress Notes (Unsigned)
01/20/2023 3:11 PM   David David Jan 13, 1951 [id number]  Referring provider: [facility] [address] [place],  [place] [address]  No chief complaint on file.   HPI:  New patient-  1) Peyronie's disease-patient complaining of curvature of erection since 2023 of about 30 degrees upward. No pain. Not sexually active. No plaque noted by pt. Not bothered.   2) ED - difficulty getting and maintaining erection. Tried sildenafil yrs ago. He gets some nocturnal erection - about 50%. He has a sig other and they haven't been intimate in 10 yrs.    3) BPH-patient on alfuzosin. Tamsulosin gave him swelling. He had a CT scan September 2023 which revealed mild BPH and some small layering bladder stones up to 4 mm.  A January 2024 PET CT scan revealed no kidney stones, again mild BPH and stable small layering bladder stones up to 4 mm.  A 2018 PSA was 1.7. Recent cysto at [place] in 2024. Also reports recent DRE and PSA.   4) spermatocele-she had an ultrasound at the [place] which revealed a 17 mm right testicular cyst and a 3 cm right spermatocele.  Today, seen for the above. He's had some back pain and leg pain. Told it might be kidney stones. He gets another scan soon Thursday at [facility] [place].   Much of his care is at the [place] [facility] [place]. He carries a NTG patch on his ankles. He is on plavix.   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 Was in the [employer].   PMH: Past Medical History:  Diagnosis Date   Alcoholism in family    Anxiety    Arthritis    Depressive disorder, not elsewhere classified    Diverticulosis of colon (without mention of hemorrhage)    Dysfunction of eustachian tube    Exposure to STD    Family history of diabetes mellitus    Family history of psychiatric condition    family Hx of suicide    GERD (gastroesophageal reflux disease)    History of radiation therapy    Right lung 05/23/22-06/06/22-Dr. Antony Blackbird   Impotence of organic origin    erectile dysfunction    Lipoprotein deficiencies    Other diseases of lung, not elsewhere classified    pulmonary nodule   Peripheral vascular disease, unspecified (HCC)    lower extremity peripheral artery disease   Personal history of colonic polyps    Routine general medical examination at a health care facility    Seizures (HCC)    Sprain of neck    Hx   Thyroid disease    Tobacco use disorder    Unspecified hypertensive kidney disease with chronic kidney disease stage I through stage IV, or unspecified(403.90)    cholesterol embolization syndrome    Surgical History: Past Surgical History:  Procedure Laterality Date   ABDOMINAL AORTOGRAM W/LOWER EXTREMITY N/A 11/07/2021   Procedure: ABDOMINAL AORTOGRAM W/LOWER EXTREMITY;  Surgeon: Iran Ouch, MD;  Location: [facility] INVASIVE CV LAB;  Service: Cardiovascular;  Laterality: N/A;   angiography     bilateral lower extremity angiography, stenting of the left.    BRONCHIAL BIOPSY  03/21/2022   Procedure: BRONCHIAL BIOPSIES;  Surgeon: Omar Person, MD;  Location: [facility] ENDOSCOPY;  Service: Pulmonary;;   BRONCHIAL BRUSHINGS  03/21/2022   Procedure: BRONCHIAL BRUSHINGS;  Surgeon: Omar Person, MD;  Location: [facility] ENDOSCOPY;  Service: Pulmonary;;   BRONCHIAL NEEDLE ASPIRATION BIOPSY  03/21/2022   Procedure: BRONCHIAL NEEDLE ASPIRATION BIOPSIES;  Surgeon:  Omar Person, MD;  Location: [facility] ENDOSCOPY;  Service: Pulmonary;;   BRONCHIAL WASHINGS  03/21/2022   Procedure: BRONCHIAL WASHINGS;  Surgeon: Omar Person, MD;  Location: [facility] ENDOSCOPY;  Service: Pulmonary;;   CARDIAC CATHETERIZATION N/A 07/19/2014   Procedure: Left Heart Cath and Coronary Angiography;  Surgeon: Marykay Lex, MD;  Location: [facility] INVASIVE CV LAB CUPID;  Service: Cardiovascular;  Laterality: N/A;   CATARACT EXTRACTION Bilateral    EYE SURGERY  2016   blown macular   FIDUCIAL MARKER PLACEMENT  03/21/2022   Procedure: FIDUCIAL MARKER PLACEMENT;  Surgeon: Omar Person, MD;  Location:  [facility] ENDOSCOPY;  Service: Pulmonary;;   KNEE ARTHROSCOPY Left    x 2   LOWER EXTREMITY ANGIOGRAM N/A 11/04/2012   Procedure: LOWER EXTREMITY ANGIOGRAM;  Surgeon: Iran Ouch, MD;  Location: [facility] CATH LAB;  Service: Cardiovascular;  Laterality: N/A;   POPLITEAL ARTERY ANGIOPLASTY     didnt specify angioplasty or stent   SPERMATOCELECTOMY Left    testicular cyst   VASECTOMY      Home Medications:  Allergies as of 01/20/2023       Reactions   Wellbutrin [bupropion] Other (See Comments)   Possible seizure    Flomax [tamsulosin] Swelling   Ritalin [methylphenidate] Other (See Comments)   Unknown reaction - too strong?   Tape Other (See Comments)   "Plastic" tape TEARS THE SKIN; please use paper tape or Coban wrap!!   Trazodone Other (See Comments)   To strong   Neomycin-bacitracin Zn-polymyx Rash   Niacin    Flushing    Paroxetine Other (See Comments)   Makes the patient feel SEVERELY SEDATED/LETHARGIC/CANNOT SPEAK WORDS Lethargy (intolerance) REACTION: goofy        Medication List        Accurate as of January 20, 2023  3:11 PM. If you have any questions, ask your nurse or doctor.          STOP taking these medications    zolpidem 10 MG tablet Commonly known as: AMBIEN Stopped by: Noemi Chapel       TAKE these medications    Acidophilus Caps capsule Take 1 capsule by mouth daily.   albuterol 108 (90 Base) MCG/ACT inhaler Commonly known as: VENTOLIN HFA Inhale 2 puffs into the lungs every 6 (six) hours as needed for wheezing or shortness of breath.   alfuzosin 10 MG 24 hr tablet Commonly known as: Uroxatral Take 1 tablet (10 mg total) by mouth at bedtime.   amLODipine 10 MG tablet Commonly known as: NORVASC Take 5 mg by mouth daily.   ammonium lactate 12 % lotion Commonly known as: LAC-HYDRIN Apply 1 application topically 2 (two) times daily as needed for dry skin.   aspirin EC 81 MG tablet Take 1 tablet (81 mg total) by mouth daily.    clopidogrel 75 MG tablet Commonly known as: PLAVIX Take 1 tablet (75 mg total) by mouth daily.   escitalopram 10 MG tablet Commonly known as: LEXAPRO Take 30 mg by mouth every morning.   ferrous sulfate 325 (65 FE) MG tablet Take 325 mg by mouth 3 (three) times a week. Take on Monday, Wednesday, Friday   Fish Oil 1000 MG Caps Take 1,000 mg by mouth 2 (two) times daily.   gabapentin 300 MG capsule Commonly known as: NEURONTIN Take 600 mg by mouth 2 (two) times daily.   hydrOXYzine 25 MG tablet Commonly known as: ATARAX Take 25 mg by mouth at bedtime as needed (  sleep).   ibuprofen 800 MG tablet Commonly known as: ADVIL Take 1 tablet (800 mg total) by mouth 3 (three) times daily.   mirtazapine 15 MG tablet Commonly known as: REMERON Take 1 tablet (15 mg total) by mouth at bedtime.   Multi-Vitamin tablet Take 1 tablet by mouth daily. theragran   nitroGLYCERIN 0.2 mg/hr patch Commonly known as: NITRODUR - Dosed in mg/24 hr Place 1 patch (0.2 mg total) onto the skin daily as needed (Apply to foot for blood flow).   omeprazole 40 MG capsule Commonly known as: PRILOSEC Take 40 mg by mouth daily.   pravastatin 40 MG tablet Commonly known as: PRAVACHOL Take 40 mg by mouth at bedtime.   Spiriva Respimat 2.5 MCG/ACT Aers Generic drug: Tiotropium Bromide Monohydrate Inhale 2 puffs into the lungs daily.   sucralfate 1 g tablet Commonly known as: Carafate Take 1 tablet (1 g total) by mouth 4 (four) times daily -  with meals and at bedtime. Crush and dissolve in 10 mL's of warm water prior to swallowing, take 20 min before meals        Allergies:  Allergies  Allergen Reactions   Wellbutrin [Bupropion] Other (See Comments)    Possible seizure    Flomax [Tamsulosin] Swelling   Ritalin [Methylphenidate] Other (See Comments)    Unknown reaction - too strong?   Tape Other (See Comments)    "Plastic" tape TEARS THE SKIN; please use paper tape or Coban wrap!!   Trazodone  Other (See Comments)    To strong   Neomycin-Bacitracin Zn-Polymyx Rash   Niacin     Flushing    Paroxetine Other (See Comments)    Makes the patient feel SEVERELY SEDATED/LETHARGIC/CANNOT SPEAK WORDS Lethargy (intolerance) REACTION: goofy    Family History: Family History  Problem Relation Age of Onset   Congestive Heart Failure Mother    CAD Father    Alzheimer's disease Sister    Lung cancer Brother        smoking hx   Alzheimer's disease Brother    Diabetes Brother    Colon cancer Neg Hx    Stomach cancer Neg Hx    Esophageal cancer Neg Hx    Colon polyps Neg Hx     Social History:  reports that he has quit smoking. His smoking use included cigarettes. He has a 50 pack-year smoking history. He has never used smokeless tobacco. He reports current alcohol use. He reports current drug use. Drug: Marijuana.   Physical Exam: BP (!) 157/71   Pulse 74   Constitutional:  Alert and oriented, No acute distress. HEENT: [place] AT, moist mucus membranes.  Trachea midline, no masses. Cardiovascular: No clubbing, cyanosis, or edema. Respiratory: Normal respiratory effort, no increased work of breathing. GI: Abdomen is soft, nontender, nondistended, no abdominal masses GU: No CVA tenderness Skin: No rashes, bruises or suspicious lesions. Neurologic: Grossly intact, no focal deficits, moving all 4 extremities. Psychiatric: Normal mood and affect.  Laboratory Data: Lab Results  Component Value Date   WBC 11.1 (H) 03/21/2022   HGB 14.4 03/21/2022   HCT 43.3 03/21/2022   MCV 93.1 03/21/2022   PLT 182 03/21/2022    Lab Results  Component Value Date   CREATININE 0.90 03/21/2022    Lab Results  Component Value Date   PSA 1.66 07/29/2016    No results found for: "TESTOSTERONE"  Lab Results  Component Value Date   HGBA1C 6.1 (H) 04/17/2017    Urinalysis    Component Value  Date/Time   COLORURINE YELLOW 04/16/2017 1752   APPEARANCEUR HAZY (A) 04/16/2017 1752   LABSPEC  1.010 04/16/2017 1752   PHURINE 6.0 04/16/2017 1752   GLUCOSEU NEGATIVE 04/16/2017 1752   HGBUR NEGATIVE 04/16/2017 1752   BILIRUBINUR negative 12/04/2022 1007   KETONESUR negative 12/04/2022 1007   KETONESUR NEGATIVE 04/16/2017 1752   PROTEINUR negative 12/04/2022 1007   PROTEINUR NEGATIVE 04/16/2017 1752   UROBILINOGEN 0.2 12/04/2022 1007   UROBILINOGEN 1.0 01/30/2014 2044   NITRITE Negative 12/04/2022 1007   NITRITE NEGATIVE 04/16/2017 1752   LEUKOCYTESUR Negative 12/04/2022 1007    Lab Results  Component Value Date   BACTERIA RARE 01/30/2014    Pertinent Imaging: Pet scan - CT   Assessment & Plan:    1. Peyronie disease Monitor. Mild curve  2. ED - disc nature r/b of pde5i (has a ntg patch), ICI, vibration, VED. He will consider.   3. Bladder stones - stable over time with another CT soon. He will bring report.   4. BPH - he will bring recent PSA. Offered PSA and DRE here.    No follow-ups on file.  Jerilee Field, MD  [facility]  [address] [place], [place] [address] [phone]

## 2023-01-20 NOTE — Patient Instructions (Addendum)
Continue Spiriva 2 puffs daily Continue Albuterol inhaler 2 puffs every 6 hours as needed for shortness of breath or wheezing. Notify if symptoms persist despite rescue inhaler/neb use.   Attend appointment with oncology and radiation oncology as scheduled  Attend CT chest as scheduled  Referral to pulmonary rehab  Follow up in 6 months with new pulmonologist or David Chasitty Hehl,NP. If symptoms do not improve or worsen, please contact office for sooner follow up or seek emergency care.

## 2023-01-23 ENCOUNTER — Other Ambulatory Visit: Payer: Self-pay | Admitting: Radiation Oncology

## 2023-01-23 ENCOUNTER — Telehealth: Payer: Self-pay

## 2023-01-23 ENCOUNTER — Inpatient Hospital Stay
Admission: RE | Admit: 2023-01-23 | Discharge: 2023-01-23 | Disposition: A | Discharge status: Home / Self Care | Payer: Self-pay | Source: Ambulatory Visit | Attending: Radiation Oncology | Admitting: Radiation Oncology

## 2023-01-23 DIAGNOSIS — R911 Solitary pulmonary nodule: Secondary | ICD-10-CM

## 2023-01-23 NOTE — Telephone Encounter (Signed)
Daughter called in to report that patient just left from doctors appointment at the  [name] hospital and was told that he had a new left lower lobe lung nodule. Per daughter [place] advised her to reach out to make Dr. Roselind Messier aware. Requested daughter to have CT scan sent to Dr. Trina Ao office. Daughter asking if patient should come in before his follow up appointment in February 2025 . Please advise

## 2023-01-24 ENCOUNTER — Telehealth: Payer: Self-pay

## 2023-01-24 NOTE — Telephone Encounter (Signed)
Patients daughter [place] called regarding CT results. She was wondering if Dr. Roselind Messier wanted to " change" anything.

## 2023-01-30 NOTE — Telephone Encounter (Signed)
Called daughter Selena Batten to make aware of Dr. Gillian Shields recommendations. Per Dr. Roselind Messier patient to have  chest CT scan in 3 months and follow up afterwards. Daughter agrees. She will call with new or worsening symptoms.

## 2023-02-18 ENCOUNTER — Telehealth: Payer: Self-pay

## 2023-02-18 NOTE — Telephone Encounter (Signed)
Called daughter David David to offer appointment for David David for 12/05 at 415pm. Per daughter patient now has appointment with oncologist at [name] on 02/20/23 @ 430. Daughter will have records sent over for Dr. Roselind Messier to review.

## 2023-02-18 NOTE — Telephone Encounter (Signed)
Daughter called in requesting David David be seen by Dr. Roselind Messier. Per daughter patient has a "fatty" area to left axilla that is tender and concerning to the patient. Per daughter area appeared 2 weeks ago. Requesting to be seen  sooner.   Patient completed radiation treatment to bilateral lungs on 06/06/22.

## 2023-02-25 ENCOUNTER — Telehealth: Payer: Self-pay | Admitting: *Deleted

## 2023-02-25 NOTE — Telephone Encounter (Signed)
CALLED PATIENT'S DAUGHTER- KIMBERLY Buesing TO ASK ABOUT COMING FOR FU APPT. ON 02-27-23 @ 8:30 AM, PATIENT'S DAUGHTER AGREED, APPT. HAS BEEN BOOKED

## 2023-02-26 NOTE — Progress Notes (Signed)
Radiation Oncology         [phone] ________________________________  Name: David David MRN: [id number]  Date: 02/27/2023  DOB: 11-17-1950  Follow-Up Visit Note  CC: [place], [name], MD  Darlis Loan, MD  No diagnosis found.  Diagnosis: Bilateral lung cancers: Adenocarcinoma of the right upper lobe and left lingula    Interval Since Last Radiation: 8 months, 21 days   Intent: Curative  Radiation Treatment Dates: 05/23/2022 through 06/06/2022 Site Technique Total Dose (Gy) Dose per Fx (Gy) Completed Fx Beam Energies  Lung, Right: Lung_R IMRT 60/60 12 5/5 6XFFF  Lung, Left: Lung_L IMRT 54/54 18 3/3 6XFFF    Narrative:  The patient returns today for routine follow-up. He was last seen here for follow-up on 10/14/22.       Since his last visit, he followed up with [facility] on 01/20/23. During which time,  the patient reported having satisfactory control of his COPD symptoms, with no need for antibiotics or steroids to manage his breathing issues in the past year. Per encounter notes, his current inhaler regimen consists of Spiriva and albuterol only on rare occasion, (maybe 1-2 times a month). He also reported having occasional chest tightness, particularly when lying down to sleep at night, an occasional productive cough with mostly clear sputum, and some recent urinary symptoms which are secondary to kidney stones.  Pertinent imaging performed in the interval includes a CT of chest/thorax performed at the [place] on 01/23/23 which showed: severe emphysema with post therapy changes in the subpleural RUL and lingula, and areas of cystic change in the subpleural LLL with some associated solid appearing nodular components, consisting of a nodular density measuring approximately 8 mm along the inferior margin with an irregular morphology. Differential considerations for the 8 mm nodular density include a developed solid component within a mixed cystic and solid mass, vs a  developing irregular nodule adjacent to a predominantly cystic ill-defined lesion.      He has continued to meet with Dr. Elby Showers with [facility] medical oncology. He most recently followed up with him on 02/20/23, however encounter details are limited at this time.   ***                        Allergies:  is allergic to wellbutrin [bupropion], flomax [tamsulosin], ritalin [methylphenidate], tape, trazodone, neomycin-bacitracin zn-polymyx, niacin, and paroxetine.  Meds: Current Outpatient Medications  Medication Sig Dispense Refill   albuterol (PROVENTIL HFA;VENTOLIN HFA) 108 (90 Base) MCG/ACT inhaler Inhale 2 puffs into the lungs every 6 (six) hours as needed for wheezing or shortness of breath.     alfuzosin (UROXATRAL) 10 MG 24 hr tablet Take 1 tablet (10 mg total) by mouth at bedtime. 30 tablet 0   amLODipine (NORVASC) 10 MG tablet Take 5 mg by mouth daily.     ammonium lactate (LAC-HYDRIN) 12 % lotion Apply 1 application topically 2 (two) times daily as needed for dry skin.     aspirin EC 81 MG tablet Take 1 tablet (81 mg total) by mouth daily. 90 tablet 3   clopidogrel (PLAVIX) 75 MG tablet Take 1 tablet (75 mg total) by mouth daily. 30 tablet 3   escitalopram (LEXAPRO) 10 MG tablet Take 30 mg by mouth every morning.      ferrous sulfate 325 (65 FE) MG tablet Take 325 mg by mouth 3 (three) times a week. Take on Monday, Wednesday, Friday     gabapentin (NEURONTIN) 300 MG  capsule Take 600 mg by mouth 2 (two) times daily.     hydrOXYzine (ATARAX) 25 MG tablet Take 25 mg by mouth at bedtime as needed (sleep).     ibuprofen (ADVIL) 800 MG tablet Take 1 tablet (800 mg total) by mouth 3 (three) times daily. 21 tablet 0   Lactobacillus (ACIDOPHILUS) CAPS capsule Take 1 capsule by mouth daily.     mirtazapine (REMERON) 15 MG tablet Take 1 tablet (15 mg total) by mouth at bedtime. 30 tablet 2   Multiple Vitamin (MULTI-VITAMIN) tablet Take 1 tablet by mouth daily. theragran     nitroGLYCERIN (NITRODUR -  DOSED IN MG/24 HR) 0.2 mg/hr patch Place 1 patch (0.2 mg total) onto the skin daily as needed (Apply to foot for blood flow). 30 patch 3   Omega-3 Fatty Acids (FISH OIL) 1000 MG CAPS Take 1,000 mg by mouth 2 (two) times daily.     omeprazole (PRILOSEC) 40 MG capsule Take 40 mg by mouth daily.     pravastatin (PRAVACHOL) 40 MG tablet Take 40 mg by mouth at bedtime.     sucralfate (CARAFATE) 1 g tablet Take 1 tablet (1 g total) by mouth 4 (four) times daily -  with meals and at bedtime. Crush and dissolve in 10 mL's of warm water prior to swallowing, take 20 min before meals 60 tablet 1   Tiotropium Bromide Monohydrate (SPIRIVA RESPIMAT) 2.5 MCG/ACT AERS Inhale 2 puffs into the lungs daily. 4 g 6   Current Facility-Administered Medications  Medication Dose Route Frequency Provider Last Rate Last Admin   0.9 %  sodium chloride infusion  500 mL Intravenous Continuous Hilarie Fredrickson, MD       sodium chloride flush (NS) 0.9 % injection 3 mL  3 mL Intravenous Q12H Iran Ouch, MD        Physical Findings: The patient is in no acute distress. Patient is alert and oriented.  vitals were not taken for this visit. .  No significant changes. Lungs are clear to auscultation bilaterally. Heart has regular rate and rhythm. No palpable cervical, supraclavicular, or axillary adenopathy. Abdomen soft, non-tender, normal bowel sounds.   Lab Findings: Lab Results  Component Value Date   WBC 11.1 (H) 03/21/2022   HGB 14.4 03/21/2022   HCT 43.3 03/21/2022   MCV 93.1 03/21/2022   PLT 182 03/21/2022    Radiographic Findings: No results found.  Impression:  Bilateral lung cancers: Adenocarcinoma of the right upper lobe and left lingula    The patient is recovering from the effects of radiation.  ***  Plan:  ***   *** minutes of total time was spent for this patient encounter, including preparation, face-to-face counseling with the patient and coordination of care, physical exam, and documentation  of the encounter. ____________________________________  Billie Lade, PhD, MD  This document serves as a record of services personally performed by Antony Blackbird, MD. It was created on his behalf by Neena Rhymes, a trained medical scribe. The creation of this record is based on the scribe's personal observations and the provider's statements to them. This document has been checked and approved by the attending provider.

## 2023-02-27 ENCOUNTER — Encounter: Payer: Self-pay | Admitting: Radiation Oncology

## 2023-02-27 ENCOUNTER — Ambulatory Visit
Admission: RE | Admit: 2023-02-27 | Discharge: 2023-02-27 | Disposition: A | Discharge status: Home / Self Care | Payer: Medicare Other | Source: Ambulatory Visit | Attending: Radiation Oncology | Admitting: Radiation Oncology

## 2023-02-27 VITALS — BP 155/85 | HR 57 | Temp 97.1°F | Resp 18 | Ht 75.0 in | Wt 183.5 lb

## 2023-02-27 DIAGNOSIS — Z7982 Long term (current) use of aspirin: Secondary | ICD-10-CM | POA: Diagnosis not present

## 2023-02-27 DIAGNOSIS — Z79899 Other long term (current) drug therapy: Secondary | ICD-10-CM | POA: Insufficient documentation

## 2023-02-27 DIAGNOSIS — C3492 Malignant neoplasm of unspecified part of left bronchus or lung: Secondary | ICD-10-CM

## 2023-02-27 DIAGNOSIS — Z85118 Personal history of other malignant neoplasm of bronchus and lung: Secondary | ICD-10-CM | POA: Diagnosis not present

## 2023-02-27 DIAGNOSIS — Z7902 Long term (current) use of antithrombotics/antiplatelets: Secondary | ICD-10-CM | POA: Insufficient documentation

## 2023-02-27 DIAGNOSIS — Z923 Personal history of irradiation: Secondary | ICD-10-CM | POA: Diagnosis not present

## 2023-02-27 DIAGNOSIS — C3491 Malignant neoplasm of unspecified part of right bronchus or lung: Secondary | ICD-10-CM

## 2023-02-27 NOTE — Progress Notes (Signed)
David David is here today for follow up post radiation to the lung.  Lung Side: Bilateral lung  Does the patient complain of any of the following: Pain:Left flank pain Shortness of breath w/wo exertion: No Cough: He reports coughing intermittently. Hemoptysis: Denies hemoptysis. Pain with swallowing: Reports tightness when he swallows. Swallowing/choking concerns: He reports waking up choking a few times.  Appetite: Fair  Energy Level: Fair Post radiation skin Changes:Denies any post radiation changes.     BP (!) 155/85 (BP Location: Left Arm, Patient Position: Sitting)   Pulse (!) 57   Temp (!) 97.1 F (36.2 C) (Temporal)   Resp 18   Ht 6\' 3"  (1.905 m)   Wt 183 lb 8 oz (83.2 kg)   SpO2 100%   BMI 22.94 kg/m

## 2023-03-24 ENCOUNTER — Ambulatory Visit: Payer: Medicare Other | Admitting: Urology

## 2023-04-15 NOTE — Addendum Note (Signed)
Encounter addended by: Silvana Newness on: 04/15/2023 4:25 PM  Actions taken: Imaging Exam ended

## 2023-04-17 ENCOUNTER — Telehealth: Payer: Self-pay | Admitting: *Deleted

## 2023-04-17 NOTE — Telephone Encounter (Signed)
CALLED PATIENT TO INFORM OF CT FOR 04-18-23- ARRIVAL TIME- 6 PM @ [facility], NO RESTRICTIONS TO SCAN, PATIENT TO CHECK IN @ [facility] ED, PATIENT TO RECEIVE RESULTS FROM DR. KINARD ON 04-21-23 @ 10 AM, SPOKE WITH PATIENT'S DAUGHTER- KIMBERLY Auriemma AND SHE IS AWARE OF THESE APPTS. AND THE INSTRUCTIONS

## 2023-04-18 ENCOUNTER — Ambulatory Visit (HOSPITAL_COMMUNITY)
Admission: RE | Admit: 2023-04-18 | Discharge: 2023-04-18 | Disposition: A | Discharge status: Home / Self Care | Payer: No Typology Code available for payment source | Source: Ambulatory Visit | Attending: Radiation Oncology | Admitting: Radiation Oncology

## 2023-04-18 DIAGNOSIS — R918 Other nonspecific abnormal finding of lung field: Secondary | ICD-10-CM | POA: Diagnosis present

## 2023-04-20 NOTE — Progress Notes (Signed)
Radiation Oncology         [phone] ________________________________  Name: David David MRN: [id number]  Date: 04/21/2023  DOB: Jun 05, 1950  Follow-Up Visit Note  CC: [place], [name], MD  Darlis Loan, MD  No diagnosis found.  Diagnosis: Bilateral lung cancers: Adenocarcinoma of the right upper lobe and left lingula    Interval Since Last Radiation: 10 months and 13 days   Intent: Curative  Radiation Treatment Dates: 05/23/2022 through 06/06/2022 Site Technique Total Dose (Gy) Dose per Fx (Gy) Completed Fx Beam Energies  Lung, Right: Lung_R IMRT 60/60 12 5/5 6XFFF  Lung, Left: Lung_L IMRT 54/54 18 3/3 6XFFF    Narrative:  The patient returns today for routine follow-up and to review recent imaging. He was last seen here for follow-up on 02/27/23.     His most recent chest CT without contrast on 04/18/23 demonstrates: ***  No other significant oncologic interval history since the patient was last seen here for follow-up.   ***                            Allergies:  is allergic to wellbutrin [bupropion], flomax [tamsulosin], ritalin [methylphenidate], tape, trazodone, neomycin-bacitracin zn-polymyx, niacin, and paroxetine.  Meds: Current Outpatient Medications  Medication Sig Dispense Refill   albuterol (PROVENTIL HFA;VENTOLIN HFA) 108 (90 Base) MCG/ACT inhaler Inhale 2 puffs into the lungs every 6 (six) hours as needed for wheezing or shortness of breath.     alfuzosin (UROXATRAL) 10 MG 24 hr tablet Take 1 tablet (10 mg total) by mouth at bedtime. 30 tablet 0   amLODipine (NORVASC) 10 MG tablet Take 5 mg by mouth daily.     ammonium lactate (LAC-HYDRIN) 12 % lotion Apply 1 application topically 2 (two) times daily as needed for dry skin.     aspirin EC 81 MG tablet Take 1 tablet (81 mg total) by mouth daily. 90 tablet 3   clopidogrel (PLAVIX) 75 MG tablet Take 1 tablet (75 mg total) by mouth daily. 30 tablet 3   escitalopram (LEXAPRO) 10 MG tablet Take 30 mg by  mouth every morning.      ferrous sulfate 325 (65 FE) MG tablet Take 325 mg by mouth 3 (three) times a week. Take on Monday, Wednesday, Friday     gabapentin (NEURONTIN) 300 MG capsule Take 600 mg by mouth 2 (two) times daily.     hydrOXYzine (ATARAX) 25 MG tablet Take 25 mg by mouth at bedtime as needed (sleep).     ibuprofen (ADVIL) 800 MG tablet Take 1 tablet (800 mg total) by mouth 3 (three) times daily. 21 tablet 0   Lactobacillus (ACIDOPHILUS) CAPS capsule Take 1 capsule by mouth daily.     mirtazapine (REMERON) 15 MG tablet Take 1 tablet (15 mg total) by mouth at bedtime. 30 tablet 2   Multiple Vitamin (MULTI-VITAMIN) tablet Take 1 tablet by mouth daily. theragran     nitroGLYCERIN (NITRODUR - DOSED IN MG/24 HR) 0.2 mg/hr patch Place 1 patch (0.2 mg total) onto the skin daily as needed (Apply to foot for blood flow). 30 patch 3   Omega-3 Fatty Acids (FISH OIL) 1000 MG CAPS Take 1,000 mg by mouth 2 (two) times daily. (Patient not taking: Reported on 02/27/2023)     omeprazole (PRILOSEC) 40 MG capsule Take 40 mg by mouth daily.     pravastatin (PRAVACHOL) 40 MG tablet Take 40 mg by mouth at bedtime.  sucralfate (CARAFATE) 1 g tablet Take 1 tablet (1 g total) by mouth 4 (four) times daily -  with meals and at bedtime. Crush and dissolve in 10 mL's of warm water prior to swallowing, take 20 min before meals 60 tablet 1   Tiotropium Bromide Monohydrate (SPIRIVA RESPIMAT) 2.5 MCG/ACT AERS Inhale 2 puffs into the lungs daily. 4 g 6   Current Facility-Administered Medications  Medication Dose Route Frequency Provider Last Rate Last Admin   0.9 %  sodium chloride infusion  500 mL Intravenous Continuous Hilarie Fredrickson, MD       sodium chloride flush (NS) 0.9 % injection 3 mL  3 mL Intravenous Q12H Iran Ouch, MD        Physical Findings: The patient is in no acute distress. Patient is alert and oriented.  vitals were not taken for this visit. .  No significant changes. Lungs are clear to  auscultation bilaterally. Heart has regular rate and rhythm. No palpable cervical, supraclavicular, or axillary adenopathy. Abdomen soft, non-tender, normal bowel sounds.   Lab Findings: Lab Results  Component Value Date   WBC 11.1 (H) 03/21/2022   HGB 14.4 03/21/2022   HCT 43.3 03/21/2022   MCV 93.1 03/21/2022   PLT 182 03/21/2022    Radiographic Findings: No results found.  Impression: Bilateral lung cancers: Adenocarcinoma of the right upper lobe and left lingula    The patient is recovering from the effects of radiation.  ***  Plan:  ***   *** minutes of total time was spent for this patient encounter, including preparation, face-to-face counseling with the patient and coordination of care, physical exam, and documentation of the encounter. ____________________________________  Billie Lade, PhD, MD  This document serves as a record of services personally performed by Antony Blackbird, MD. It was created on his behalf by Neena Rhymes, a trained medical scribe. The creation of this record is based on the scribe's personal observations and the provider's statements to them. This document has been checked and approved by the attending provider.

## 2023-04-21 ENCOUNTER — Encounter: Payer: Self-pay | Admitting: Radiation Oncology

## 2023-04-21 ENCOUNTER — Ambulatory Visit
Admission: RE | Admit: 2023-04-21 | Discharge: 2023-04-21 | Disposition: A | Discharge status: Home / Self Care | Payer: Non-veteran care | Source: Ambulatory Visit | Attending: Radiation Oncology | Admitting: Radiation Oncology

## 2023-04-21 VITALS — BP 142/73 | HR 68 | Temp 97.5°F | Resp 18 | Ht 75.0 in | Wt 177.2 lb

## 2023-04-21 DIAGNOSIS — C3491 Malignant neoplasm of unspecified part of right bronchus or lung: Secondary | ICD-10-CM

## 2023-04-21 DIAGNOSIS — C3492 Malignant neoplasm of unspecified part of left bronchus or lung: Secondary | ICD-10-CM

## 2023-04-21 NOTE — Progress Notes (Signed)
Velia Meyer is here today for follow up post radiation to the lung.  Lung Side: Bilateral lung   Does the patient complain of any of the following: Pain: He states under the right arm hurts. Shortness of breath w/wo exertion: Denies Cough: He reports coughing intermittently. Hemoptysis: Denies Pain with swallowing: Denies Swallowing/choking concerns: He reports tightness in throat. Appetite: Poor Energy Level: Poor Post radiation skin Changes: Denies    BP (!) 142/73 (BP Location: Left Arm, Patient Position: Sitting)   Pulse 68   Temp (!) 97.5 F (36.4 C) (Temporal)   Resp 18   Ht 6\' 3"  (1.905 m)   Wt 177 lb 4 oz (80.4 kg)   SpO2 100%   BMI 22.15 kg/m      Additional comments if applicable:

## 2023-05-13 ENCOUNTER — Ambulatory Visit: Payer: Medicare Other | Attending: Cardiovascular Disease | Admitting: Cardiovascular Disease

## 2023-05-13 ENCOUNTER — Encounter: Payer: Self-pay | Admitting: Cardiovascular Disease

## 2023-05-13 VITALS — BP 140/78 | HR 64 | Ht 75.0 in | Wt 174.4 lb

## 2023-05-13 DIAGNOSIS — I739 Peripheral vascular disease, unspecified: Secondary | ICD-10-CM | POA: Diagnosis not present

## 2023-05-13 DIAGNOSIS — I10 Essential (primary) hypertension: Secondary | ICD-10-CM

## 2023-05-13 DIAGNOSIS — Z720 Tobacco use: Secondary | ICD-10-CM | POA: Diagnosis not present

## 2023-05-13 DIAGNOSIS — E785 Hyperlipidemia, unspecified: Secondary | ICD-10-CM

## 2023-05-13 NOTE — Patient Instructions (Signed)
 Medication Instructions:  No changes *If you need a refill on your cardiac medications before your next appointment, please call your pharmacy*   Lab Work: None ordered If you have labs (blood work) drawn today and your tests are completely normal, you will receive your results only by: MyChart Message (if you have MyChart) OR A paper copy in the mail If you have any lab test that is abnormal or we need to change your treatment, we will call you to review the results.   Testing/Procedures: None ordered   Follow-Up: At [facility], you and your health needs are our priority.  As part of our continuing mission to provide you with exceptional heart care, we have created designated Provider Care Teams.  These Care Teams include your primary Cardiologist (physician) and Advanced Practice Providers (APPs -  Physician Assistants and Nurse Practitioners) who all work together to provide you with the care you need, when you need it.  We recommend signing up for the patient portal called "MyChart".  Sign up information is provided on this After Visit Summary.  MyChart is used to connect with patients for Virtual Visits (Telemedicine).  Patients are able to view lab/test results, encounter notes, upcoming appointments, etc.  Non-urgent messages can be sent to your provider as well.   To learn more about what you can do with MyChart, go to [email or web address].    Your next appointment:   12 month(s)  Provider:   Lorine Bears, MD

## 2023-05-13 NOTE — Progress Notes (Signed)
 Cardiology Office Note   Date:  05/13/2023   ID:  David David, David David May 01, 1950, MRN [id number]  PCP:  [place], [name], MD  Cardiologist:   Lorine Bears, MD   Chief Complaint  Patient presents with   Shortness of Breath    Patient has lung cancer and gets SOB at times      History of Present Illness: David David is a 73 y.o. male who presents for a follow-up visit regarding peripheral arterial disease. The patient has lower extremity peripheral arterial disease with recurrent atheroembolic events to the left foot due to distal left popliteal artery stenosis with previous Cypher drug-eluting stent placement which was used in order to treat an ulcerated plaque with distal embolization.  He was seen in 11/2012  for a painless rash involving the left great toe and medial foot, with typical appearance of an athero embolic event. Echocardiogram was unremarkable.  CTA of the chest abdomen and pelvis with runoff to the feet demonstrated scattered atheromatous the aorta but no significant findings.  The left tibioperoneal stent had greater than 70% stenosis. ABI was normal but there was significant dampening in pressure noted in the big toe. I proceeded with angiography which confirmed this and also showed an occluded dorsalis pedis likely from embolization. I performed balloon angioplasty with an Angioscore balloon to both ostial anterior tibial and ostial tibioperoneal trunk.   He had cardiac catheterization in May of 2016 which showed moderate left circumflex stenosis and no evidence of obstructive disease.   He had dizziness and presyncope in the past and thought to have a TIA but neurologic evaluation was overall unremarkable.  He was seen in 2023 due to ischemic right foot.  Angiography was done in August 2023 which showed an occluded right anterior tibial artery which was thought to be subacute and thrombotic.  However, his posterior tibial artery was large and dominant and  supplied the pedal arch.  Thus, no revascularization was needed.  He finished radiation therapy for adenocarcinoma of the lung.  He had COVID-19 infection in December and had persistent shortness of breath after that but he is feeling better.  No chest pain.   Past Medical History:  Diagnosis Date   Alcoholism in family    Anxiety    Arthritis    Depressive disorder, not elsewhere classified    Diverticulosis of colon (without mention of hemorrhage)    Dysfunction of eustachian tube    Exposure to STD    Family history of diabetes mellitus    Family history of psychiatric condition    family Hx of suicide    GERD (gastroesophageal reflux disease)    History of radiation therapy    Right lung 05/23/22-06/06/22-Dr. Antony Blackbird   Impotence of organic origin    erectile dysfunction   Lipoprotein deficiencies    Other diseases of lung, not elsewhere classified    pulmonary nodule   Peripheral vascular disease, unspecified (HCC)    lower extremity peripheral artery disease   Personal history of colonic polyps    Routine general medical examination at a health care facility    Seizures (HCC)    Sprain of neck    Hx   Thyroid disease    Tobacco use disorder    Unspecified hypertensive kidney disease with chronic kidney disease stage I through stage IV, or unspecified(403.90)    cholesterol embolization syndrome    Past Surgical History:  Procedure Laterality Date   ABDOMINAL AORTOGRAM W/LOWER EXTREMITY  N/A 11/07/2021   Procedure: ABDOMINAL AORTOGRAM W/LOWER EXTREMITY;  Surgeon: Iran Ouch, MD;  Location: [facility] INVASIVE CV LAB;  Service: Cardiovascular;  Laterality: N/A;   angiography     bilateral lower extremity angiography, stenting of the left.    BRONCHIAL BIOPSY  03/21/2022   Procedure: BRONCHIAL BIOPSIES;  Surgeon: Omar Person, MD;  Location: [facility] ENDOSCOPY;  Service: Pulmonary;;   BRONCHIAL BRUSHINGS  03/21/2022   Procedure: BRONCHIAL BRUSHINGS;  Surgeon: Omar Person, MD;  Location: [facility] ENDOSCOPY;  Service: Pulmonary;;   BRONCHIAL NEEDLE ASPIRATION BIOPSY  03/21/2022   Procedure: BRONCHIAL NEEDLE ASPIRATION BIOPSIES;  Surgeon: Omar Person, MD;  Location: [facility] ENDOSCOPY;  Service: Pulmonary;;   BRONCHIAL WASHINGS  03/21/2022   Procedure: BRONCHIAL WASHINGS;  Surgeon: Omar Person, MD;  Location: [facility] ENDOSCOPY;  Service: Pulmonary;;   CARDIAC CATHETERIZATION N/A 07/19/2014   Procedure: Left Heart Cath and Coronary Angiography;  Surgeon: Marykay Lex, MD;  Location: [facility] INVASIVE CV LAB CUPID;  Service: Cardiovascular;  Laterality: N/A;   CATARACT EXTRACTION Bilateral    EYE SURGERY  2016   blown macular   FIDUCIAL MARKER PLACEMENT  03/21/2022   Procedure: FIDUCIAL MARKER PLACEMENT;  Surgeon: Omar Person, MD;  Location: [facility] ENDOSCOPY;  Service: Pulmonary;;   KNEE ARTHROSCOPY Left    x 2   LOWER EXTREMITY ANGIOGRAM N/A 11/04/2012   Procedure: LOWER EXTREMITY ANGIOGRAM;  Surgeon: Iran Ouch, MD;  Location: [facility] CATH LAB;  Service: Cardiovascular;  Laterality: N/A;   POPLITEAL ARTERY ANGIOPLASTY     didnt specify angioplasty or stent   SPERMATOCELECTOMY Left    testicular cyst   VASECTOMY       Current Outpatient Medications  Medication Sig Dispense Refill   albuterol (PROVENTIL HFA;VENTOLIN HFA) 108 (90 Base) MCG/ACT inhaler Inhale 2 puffs into the lungs every 6 (six) hours as needed for wheezing or shortness of breath.     amLODipine (NORVASC) 10 MG tablet Take 5 mg by mouth daily.     ammonium lactate (LAC-HYDRIN) 12 % lotion Apply 1 application topically 2 (two) times daily as needed for dry skin.     aspirin EC 81 MG tablet Take 1 tablet (81 mg total) by mouth daily. 90 tablet 3   clopidogrel (PLAVIX) 75 MG tablet Take 1 tablet (75 mg total) by mouth daily. 30 tablet 3   escitalopram (LEXAPRO) 10 MG tablet Take 30 mg by mouth every morning.      ferrous sulfate 325 (65 FE) MG tablet Take 325 mg by mouth 3 (three) times a  week. Take on Monday, Wednesday, Friday     gabapentin (NEURONTIN) 300 MG capsule Take 600 mg by mouth 2 (two) times daily.     hydrOXYzine (ATARAX) 25 MG tablet Take 25 mg by mouth at bedtime as needed (sleep).     ibuprofen (ADVIL) 800 MG tablet Take 1 tablet (800 mg total) by mouth 3 (three) times daily. 21 tablet 0   Lactobacillus (ACIDOPHILUS) CAPS capsule Take 1 capsule by mouth daily.     mirtazapine (REMERON) 15 MG tablet Take 1 tablet (15 mg total) by mouth at bedtime. 30 tablet 2   Multiple Vitamin (MULTI-VITAMIN) tablet Take 1 tablet by mouth daily. theragran     omeprazole (PRILOSEC) 40 MG capsule Take 40 mg by mouth daily.     pravastatin (PRAVACHOL) 40 MG tablet Take 40 mg by mouth at bedtime.     sucralfate (CARAFATE) 1 g tablet Take 1 tablet (  1 g total) by mouth 4 (four) times daily -  with meals and at bedtime. Crush and dissolve in 10 mL's of warm water prior to swallowing, take 20 min before meals 60 tablet 1   Tiotropium Bromide Monohydrate (SPIRIVA RESPIMAT) 2.5 MCG/ACT AERS Inhale 2 puffs into the lungs daily. 4 g 6   alfuzosin (UROXATRAL) 10 MG 24 hr tablet Take 1 tablet (10 mg total) by mouth at bedtime. (Patient not taking: Reported on 05/13/2023) 30 tablet 0   nitroGLYCERIN (NITRODUR - DOSED IN MG/24 HR) 0.2 mg/hr patch Place 1 patch (0.2 mg total) onto the skin daily as needed (Apply to foot for blood flow). (Patient not taking: Reported on 05/13/2023) 30 patch 3   Omega-3 Fatty Acids (FISH OIL) 1000 MG CAPS Take 1,000 mg by mouth 2 (two) times daily. (Patient not taking: Reported on 02/27/2023)     Current Facility-Administered Medications  Medication Dose Route Frequency Provider Last Rate Last Admin   0.9 %  sodium chloride infusion  500 mL Intravenous Continuous Hilarie Fredrickson, MD       sodium chloride flush (NS) 0.9 % injection 3 mL  3 mL Intravenous Q12H Iran Ouch, MD        Allergies:   Wellbutrin [bupropion], Flomax [tamsulosin], Ritalin [methylphenidate],  Tape, Trazodone, Neomycin-bacitracin zn-polymyx, Niacin, and Paroxetine    Social History:  The patient  reports that he has quit smoking. His smoking use included cigarettes. He has a 50 pack-year smoking history. He has never used smokeless tobacco. He reports current alcohol use. He reports current drug use. Drug: Marijuana.   Family History:  The patient's family history includes Alzheimer's disease in his brother and sister; CAD in his father; Congestive Heart Failure in his mother; Diabetes in his brother; Lung cancer in his brother.    ROS:  Please see the history of present illness.   Otherwise, review of systems are positive for none.   All other systems are reviewed and negative.    PHYSICAL EXAM: VS:  BP (!) 140/78   Pulse 64   Ht 6\' 3"  (1.905 m)   Wt 174 lb 6.4 oz (79.1 kg)   SpO2 96%   BMI 21.80 kg/m  , BMI Body mass index is 21.8 kg/m. GEN: Well nourished, well developed, in no acute distress  HEENT: normal  Neck: no JVD, carotid bruits, or masses Cardiac: RRR; no murmurs, rubs, or gallops,no edema  Respiratory:  clear to auscultation bilaterally, normal work of breathing GI: soft, nontender, nondistended, + BS MS: no deformity or atrophy  Skin: warm and dry, no rash Neuro:  Strength and sensation are intact Psych: euthymic mood, full affect Vascular: Dorsalis pedis: +2 on the left.  Posterior tibial is +1.  On the right, the dorsalis pedis is absent but has normal posterior tibial pulse.  no ulceration   EKG:  EKG is ordered today. EKG showed: Normal sinus rhythm Nonspecific T wave abnormality      Recent Labs: No results found for requested labs within last 365 days.    Lipid Panel    Component Value Date/Time   CHOL 126 04/17/2017 0344   TRIG 150 (H) 04/17/2017 0344   HDL 36 (L) 04/17/2017 0344   CHOLHDL 3.5 04/17/2017 0344   VLDL 30 04/17/2017 0344   LDLCALC 60 04/17/2017 0344      Wt Readings from Last 3 Encounters:  05/13/23 174 lb 6.4 oz  (79.1 kg)  04/21/23 177 lb 4 oz (80.4 kg)  02/27/23 183 lb 8 oz (83.2 kg)          No data to display            ASSESSMENT AND PLAN:  1.  Peripheral arterial disease: Previous lower extremity intervention on the left side.  Occluded right anterior tibial artery.  Currently with no claudication, rest pain or ulceration.  Continue lifelong dual antiplatelet therapy as tolerated.  I reviewed most recent Doppler studies with him.  These were done in September 2024 and showed normal ABI bilaterally with patent stent in the left TP trunk. Repeat Doppler studies in September.   2.  Previous tobacco use: I discussed with him the importance of not smoking again.   3. Essential hypertension: Blood pressure is reasonably controlled.  4. Hyperlipidemia: Continue treatment with pravastatin with a target LDL of less than 70.    Most recent LDL was 60.  5.  lung cancer: Status postradiation therapy.    Disposition:   Follow-up in 6 months. Signed,  Lorine Bears, MD  05/13/2023 9:01 AM    [facility]

## 2023-10-17 ENCOUNTER — Telehealth: Payer: Self-pay | Admitting: *Deleted

## 2023-10-17 NOTE — Telephone Encounter (Signed)
 CALLED PATIENT TO INFORM OF CT FOR 10-23-23- ARRIVAL TIME- 3:15 PM @ [facility], NO RESTRICTIONS TO SCAN, PATIENT TO RECEIVE RESULTS FROM PA ELLIE MUIR 10-28-23 @ 11 AM, LVM FOR A RETURN CALL

## 2023-10-20 ENCOUNTER — Telehealth: Payer: Self-pay

## 2023-10-20 ENCOUNTER — Telehealth: Payer: Self-pay | Admitting: *Deleted

## 2023-10-20 NOTE — Telephone Encounter (Signed)
 Patient's daughter called in to report patient having pain to right shoulder and back. Reports pain takes patients breath. Patient completed treatment to  right and left lung on 05/23/22-06/06/22. Daughter is asking if patient should come in and be seen prior to CT scan.

## 2023-10-20 NOTE — Telephone Encounter (Signed)
 Returned patient's phone call, spoke with patient

## 2023-10-21 ENCOUNTER — Ambulatory Visit: Payer: Self-pay | Admitting: Radiology

## 2023-10-22 NOTE — Progress Notes (Signed)
 Radiation Oncology         [phone] ________________________________  Name: David David MRN: [id number]  Date: 10/23/2023  DOB: 04-Oct-1950  Follow-Up Visit Note  CC: [place], [name], MD  [place], [name], MD  No diagnosis found.  Diagnosis: Bilateral lung cancers: Adenocarcinoma of the right upper lobe and left lingula     Interval Since Last Radiation: 1 year 4 months and 17 days    Intent: Curative  Radiation Treatment Dates: 05/23/2022 through 06/06/2022 Site Technique Total Dose (Gy) Dose per Fx (Gy) Completed Fx Beam Energies  Lung, Right: Lung_R IMRT 60/60 12 5/5 6XFFF  Lung, Left: Lung_L IMRT 54/54 18 3/3 6XFFF     Narrative:  The patient returns today for routine follow-up. He was last seen in office on 04/21/23 for a follow up visit.   Patient continued to follow up with their specialists to manage their chronic conditions. He presented for a follow up with Dr. Anselm on 05/08/23 where he reported compliance with medication and feeling well overall.   No other significant oncologic interval history since the patient was last seen.                                  Allergies:  is allergic to wellbutrin [bupropion], flomax [tamsulosin], ritalin [methylphenidate], tape, trazodone, neomycin-bacitracin zn-polymyx, niacin, and paroxetine.  Meds: Current Outpatient Medications  Medication Sig Dispense Refill   albuterol  (PROVENTIL  HFA;VENTOLIN  HFA) 108 (90 Base) MCG/ACT inhaler Inhale 2 puffs into the lungs every 6 (six) hours as needed for wheezing or shortness of breath.     alfuzosin  (UROXATRAL ) 10 MG 24 hr tablet Take 1 tablet (10 mg total) by mouth at bedtime. (Patient not taking: Reported on 05/13/2023) 30 tablet 0   amLODipine  (NORVASC ) 10 MG tablet Take 5 mg by mouth daily.     ammonium lactate (LAC-HYDRIN) 12 % lotion Apply 1 application topically 2 (two) times daily as needed for dry skin.     aspirin  EC 81 MG tablet Take 1 tablet (81 mg total) by mouth daily. 90  tablet 3   clopidogrel  (PLAVIX ) 75 MG tablet Take 1 tablet (75 mg total) by mouth daily. 30 tablet 3   escitalopram  (LEXAPRO ) 10 MG tablet Take 30 mg by mouth every morning.      ferrous sulfate 325 (65 FE) MG tablet Take 325 mg by mouth 3 (three) times a week. Take on Monday, Wednesday, Friday     gabapentin  (NEURONTIN ) 300 MG capsule Take 600 mg by mouth 2 (two) times daily.     hydrOXYzine  (ATARAX ) 25 MG tablet Take 25 mg by mouth at bedtime as needed (sleep).     ibuprofen  (ADVIL ) 800 MG tablet Take 1 tablet (800 mg total) by mouth 3 (three) times daily. 21 tablet 0   Lactobacillus (ACIDOPHILUS) CAPS capsule Take 1 capsule by mouth daily.     mirtazapine  (REMERON ) 15 MG tablet Take 1 tablet (15 mg total) by mouth at bedtime. 30 tablet 2   Multiple Vitamin (MULTI-VITAMIN) tablet Take 1 tablet by mouth daily. theragran     nitroGLYCERIN  (NITRODUR - DOSED IN MG/24 HR) 0.2 mg/hr patch Place 1 patch (0.2 mg total) onto the skin daily as needed (Apply to foot for blood flow). (Patient not taking: Reported on 05/13/2023) 30 patch 3   Omega-3 Fatty Acids (FISH OIL) 1000 MG CAPS Take 1,000 mg by mouth 2 (two) times daily. (Patient  not taking: Reported on 02/27/2023)     omeprazole (PRILOSEC) 40 MG capsule Take 40 mg by mouth daily.     pravastatin  (PRAVACHOL ) 40 MG tablet Take 40 mg by mouth at bedtime.     sucralfate  (CARAFATE ) 1 g tablet Take 1 tablet (1 g total) by mouth 4 (four) times daily -  with meals and at bedtime. Crush and dissolve in 10 mL's of warm water prior to swallowing, take 20 min before meals 60 tablet 1   Tiotropium Bromide Monohydrate  (SPIRIVA  RESPIMAT) 2.5 MCG/ACT AERS Inhale 2 puffs into the lungs daily. 4 g 6   Current Facility-Administered Medications  Medication Dose Route Frequency Provider Last Rate Last Admin   0.9 %  sodium chloride  infusion  500 mL Intravenous Continuous Abran Norleen SAILOR, MD       sodium chloride  flush (NS) 0.9 % injection 3 mL  3 mL Intravenous Q12H Darron Deatrice LABOR, MD        Physical Findings: The patient is in no acute distress. Patient is alert and oriented.  vitals were not taken for this visit. .  No significant changes. Lungs are clear to auscultation bilaterally. Heart has regular rate and rhythm. No palpable cervical, supraclavicular, or axillary adenopathy. Abdomen soft, non-tender, normal bowel sounds.   Lab Findings: Lab Results  Component Value Date   WBC 11.1 (H) 03/21/2022   HGB 14.4 03/21/2022   HCT 43.3 03/21/2022   MCV 93.1 03/21/2022   PLT 182 03/21/2022    Radiographic Findings: No results found.  Impression: Bilateral lung cancers: Adenocarcinoma of the right upper lobe and left lingula    The patient is recovering from the effects of radiation.  ***  Plan:  ***   *** minutes of total time was spent for this patient encounter, including preparation, face-to-face counseling with the patient and coordination of care, physical exam, and documentation of the encounter. ____________________________________  Lynwood CHARM Nasuti, PhD, MD  This document serves as a record of services personally performed by Lynwood Nasuti, MD. It was created on his behalf by Reymundo Cartwright, a trained medical scribe. The creation of this record is based on the scribe's personal observations and the provider's statements to them. This document has been checked and approved by the attending provider.

## 2023-10-23 ENCOUNTER — Ambulatory Visit
Admission: RE | Admit: 2023-10-23 | Discharge: 2023-10-23 | Disposition: A | Discharge status: Home / Self Care | Source: Ambulatory Visit | Attending: Radiation Oncology | Admitting: Radiation Oncology

## 2023-10-23 ENCOUNTER — Encounter: Payer: Self-pay | Admitting: Radiation Oncology

## 2023-10-23 ENCOUNTER — Ambulatory Visit (HOSPITAL_COMMUNITY)
Admission: RE | Admit: 2023-10-23 | Discharge: 2023-10-23 | Disposition: A | Discharge status: Home / Self Care | Source: Ambulatory Visit | Attending: Radiology | Admitting: Radiology

## 2023-10-23 VITALS — BP 149/82 | HR 50 | Temp 96.9°F | Resp 18 | Ht 75.0 in | Wt 162.5 lb

## 2023-10-23 DIAGNOSIS — E041 Nontoxic single thyroid nodule: Secondary | ICD-10-CM | POA: Diagnosis not present

## 2023-10-23 DIAGNOSIS — C3491 Malignant neoplasm of unspecified part of right bronchus or lung: Secondary | ICD-10-CM

## 2023-10-23 DIAGNOSIS — R911 Solitary pulmonary nodule: Secondary | ICD-10-CM | POA: Insufficient documentation

## 2023-10-23 DIAGNOSIS — J439 Emphysema, unspecified: Secondary | ICD-10-CM | POA: Insufficient documentation

## 2023-10-23 DIAGNOSIS — C3492 Malignant neoplasm of unspecified part of left bronchus or lung: Secondary | ICD-10-CM | POA: Insufficient documentation

## 2023-10-23 DIAGNOSIS — Z79899 Other long term (current) drug therapy: Secondary | ICD-10-CM | POA: Insufficient documentation

## 2023-10-23 DIAGNOSIS — I700 Atherosclerosis of aorta: Secondary | ICD-10-CM | POA: Insufficient documentation

## 2023-10-23 DIAGNOSIS — I2510 Atherosclerotic heart disease of native coronary artery without angina pectoris: Secondary | ICD-10-CM | POA: Insufficient documentation

## 2023-10-23 DIAGNOSIS — Z7982 Long term (current) use of aspirin: Secondary | ICD-10-CM | POA: Diagnosis not present

## 2023-10-23 DIAGNOSIS — Z85118 Personal history of other malignant neoplasm of bronchus and lung: Secondary | ICD-10-CM | POA: Diagnosis not present

## 2023-10-23 DIAGNOSIS — Z923 Personal history of irradiation: Secondary | ICD-10-CM | POA: Insufficient documentation

## 2023-10-23 DIAGNOSIS — Z7902 Long term (current) use of antithrombotics/antiplatelets: Secondary | ICD-10-CM | POA: Diagnosis not present

## 2023-10-23 NOTE — Progress Notes (Signed)
 David David is here today for follow up post radiation to the lung.  Lung Side: bilateral, patient completed treatment on 06/06/22  Does the patient complain of any of the following: Pain: Yes, patient reports pain to right chest. Rates pain at 9/10. Reports pain is constant and sharp. Feels like a stabbing pain.  Shortness of breath w/wo exertion: Yes Cough: Yes,dry Hemoptysis: No Pain with swallowing: yes at times Swallowing/choking concerns: Yes choking of food and medications.  Appetite: Poor, drinks boost at times.  Energy Level: low Post radiation skin Changes: No    Additional comments if applicable: Patient to have CT scan today.   BP (!) 149/82 (BP Location: Left Arm, Patient Position: Sitting)   Pulse (!) 50   Temp (!) 96.9 F (36.1 C) (Temporal)   Resp 18   Ht 6' 3 (1.905 m)   Wt 162 lb 8 oz (73.7 kg)   SpO2 100%   BMI 20.31 kg/m

## 2023-10-27 ENCOUNTER — Ambulatory Visit
Admission: RE | Admit: 2023-10-27 | Discharge: 2023-10-27 | Disposition: A | Discharge status: Home / Self Care | Source: Ambulatory Visit | Attending: Radiology | Admitting: Radiology

## 2023-10-27 DIAGNOSIS — N2889 Other specified disorders of kidney and ureter: Secondary | ICD-10-CM

## 2023-10-27 DIAGNOSIS — C3492 Malignant neoplasm of unspecified part of left bronchus or lung: Secondary | ICD-10-CM

## 2023-10-27 DIAGNOSIS — C3491 Malignant neoplasm of unspecified part of right bronchus or lung: Secondary | ICD-10-CM

## 2023-10-27 DIAGNOSIS — S2241XD Multiple fractures of ribs, right side, subsequent encounter for fracture with routine healing: Secondary | ICD-10-CM

## 2023-10-27 NOTE — Progress Notes (Signed)
 David David was called today for follow up post radiation to the lung and to receive results from recent scans.  Lung Side: bilateral, patient completed treatment on 06/06/22   Does the patient complain of any of the following: Pain:Upper chest. Reports pain is constant and sharp.  Shortness of breath w/wo exertion: demnies. He states getting short of breath with activity. Cough: Intermittently. Dry cough Hemoptysis: Denies Pain with swallowing: Yes, pan when swallowing food and medications. Swallowing/choking concerns: Yes Appetite: Poor, drinks boost sometimes. Energy Level: Low, not having much energy Post radiation skin Changes: Denies     No vitals were taken for this visit.

## 2023-10-27 NOTE — Progress Notes (Signed)
 Radiation Oncology         [phone] ________________________________  Name: David David MRN: [id number]  Date: 10/27/2023  DOB: [birth year]  Follow-Up Visit Note - Conducted via telephone at patient request.   I spoke with the patient to conduct this consult visit via telephone. The patient was notified in advance and was offered an in person or telemedicine meeting to allow for face to face communication but instead preferred to proceed with a telephone consult.   CC: [place], [name], MD  [place], [name], MD    ICD-10-CM   1. Adenocarcinoma of left lung (HCC)  C34.92     2. Adenocarcinoma of right lung (HCC)  C34.91       Diagnosis: Bilateral lung cancers: Adenocarcinoma of the right upper lobe and left lingula; s/p SBRT completed on 06/06/2022     Interval Since Last Radiation: 1 year 4 months and 17 days    Intent: Curative  Radiation Treatment Dates: 05/23/2022 through 06/06/2022 Site Technique Total Dose (Gy) Dose per Fx (Gy) Completed Fx Beam Energies  Lung, Right: Lung_R IMRT 60/60 12 5/5 6XFFF  Lung, Left: Lung_L IMRT 54/54 18 3/3 6XFFF     Narrative:  The patient returns today to review the results of his most recent CT scan.   CT scan on 10/23/2023 demonstrates decreased size of the treated right upper lobe nodule is stable 1.1 cm left perifissural pulmonary nodule stable scattered pulmonary nodule in the right lower lobe; question of a left upper/interpolar renal mass versus nodular renal cortex measuring 2.9 cm; and new right lateral second and third rib fractures.  Patient continues to complain of right sided scapula pain that radiates to his shoulder and chest. He states the the pain is aggravated with shoulder movement.  He has tried Tylenol  for this pain, but states this  does not touch it.  He has been told he is not able to take NSAIDs due to his Plavix .    Allergies:  is allergic to wellbutrin [bupropion], flomax [tamsulosin], ritalin  [methylphenidate], tape, trazodone, neomycin-bacitracin zn-polymyx, niacin, and paroxetine.  Meds: Current Outpatient Medications  Medication Sig Dispense Refill   albuterol  (PROVENTIL  HFA;VENTOLIN  HFA) 108 (90 Base) MCG/ACT inhaler Inhale 2 puffs into the lungs every 6 (six) hours as needed for wheezing or shortness of breath.     alfuzosin  (UROXATRAL ) 10 MG 24 hr tablet Take 1 tablet (10 mg total) by mouth at bedtime. (Patient not taking: Reported on 05/13/2023) 30 tablet 0   amLODipine  (NORVASC ) 10 MG tablet Take 5 mg by mouth daily.     ammonium lactate (LAC-HYDRIN) 12 % lotion Apply 1 application topically 2 (two) times daily as needed for dry skin.     aspirin  EC 81 MG tablet Take 1 tablet (81 mg total) by mouth daily. 90 tablet 3   clopidogrel  (PLAVIX ) 75 MG tablet Take 1 tablet (75 mg total) by mouth daily. 30 tablet 3   escitalopram  (LEXAPRO ) 10 MG tablet Take 30 mg by mouth every morning.      ferrous sulfate 325 (65 FE) MG tablet Take 325 mg by mouth 3 (three) times a week. Take on Monday, Wednesday, Friday     gabapentin  (NEURONTIN ) 300 MG capsule Take 600 mg by mouth 2 (two) times daily.     hydrOXYzine  (ATARAX ) 25 MG tablet Take 25 mg by mouth at bedtime as needed (sleep).     ibuprofen  (ADVIL ) 800 MG tablet Take 1 tablet (800 mg total) by mouth 3 (  three) times daily. 21 tablet 0   Lactobacillus (ACIDOPHILUS) CAPS capsule Take 1 capsule by mouth daily.     mirtazapine  (REMERON ) 15 MG tablet Take 1 tablet (15 mg total) by mouth at bedtime. 30 tablet 2   Multiple Vitamin (MULTI-VITAMIN) tablet Take 1 tablet by mouth daily. theragran     nitroGLYCERIN  (NITRODUR - DOSED IN MG/24 HR) 0.2 mg/hr patch Place 1 patch (0.2 mg total) onto the skin daily as needed (Apply to foot for blood flow). (Patient not taking: Reported on 05/13/2023) 30 patch 3   Omega-3 Fatty Acids (FISH OIL) 1000 MG CAPS Take 1,000 mg by mouth 2 (two) times daily. (Patient not taking: Reported on 02/27/2023)      omeprazole (PRILOSEC) 40 MG capsule Take 40 mg by mouth daily.     pravastatin  (PRAVACHOL ) 40 MG tablet Take 40 mg by mouth at bedtime.     sucralfate  (CARAFATE ) 1 g tablet Take 1 tablet (1 g total) by mouth 4 (four) times daily -  with meals and at bedtime. Crush and dissolve in 10 mL's of warm water prior to swallowing, take 20 min before meals 60 tablet 1   Tiotropium Bromide Monohydrate  (SPIRIVA  RESPIMAT) 2.5 MCG/ACT AERS Inhale 2 puffs into the lungs daily. 4 g 6   Current Facility-Administered Medications  Medication Dose Route Frequency Provider Last Rate Last Admin   0.9 %  sodium chloride  infusion  500 mL Intravenous Continuous Abran Norleen SAILOR, MD       sodium chloride  flush (NS) 0.9 % injection 3 mL  3 mL Intravenous Q12H Darron Deatrice LABOR, MD        Physical Findings: The patient is in no acute distress. Patient is alert and oriented.  Deferred, due to nature of telephone visit.    Lab Findings: Lab Results  Component Value Date   WBC 11.1 (H) 03/21/2022   HGB 14.4 03/21/2022   HCT 43.3 03/21/2022   MCV 93.1 03/21/2022   PLT 182 03/21/2022    Radiographic Findings: CT CHEST WO CONTRAST Result Date: 10/23/2023 CLINICAL DATA:  Non-small cell lung cancer, monitor. * Tracking Code: BO * EXAM: CT CHEST WITHOUT CONTRAST TECHNIQUE: Multidetector CT imaging of the chest was performed following the standard protocol without IV contrast. RADIATION DOSE REDUCTION: This exam was performed according to the departmental dose-optimization program which includes automated exposure control, adjustment of the mA and/or kV according to patient size and/or use of iterative reconstruction technique. COMPARISON:  Multiple priors including most recent CT April 18, 2023 FINDINGS: Cardiovascular: Aortic atherosclerosis. Coronary artery calcifications. Normal size heart. No significant pericardial effusion/thickening. Mediastinum/Nodes: Calcified right thyroid  nodule measures 12 mm. No pathologically  enlarged mediastinal, hilar or axillary lymph nodes noting limited sensitivity of the hilar structures on noncontrast enhanced examination. Esophagus is grossly unremarkable. Lungs/Pleura: Decreased size of the right upper lobe peripheral pulmonary nodule adjacent to the flu do show marker measuring 19 x 17 mm on image 43/8 previously 2.3 x 1.5 cm. Similar adjacent coarse interstitial thickening, linear/band opacities and pleural thickening with architectural distortion compatible with post radiation change. Stable 11 x 4 mm left perifissural pulmonary nodule on image 78/8. Fiducial marker in the lingula with similar lingular and left lower lobe post radiation change. Stable scattered pulmonary nodules for instance measuring 5 mm in the right lower lobe on image 114/8. Emphysema. Upper Abdomen: Question of a left upper/interpolar renal mass versus nodular renal cortex measuring 2.9 cm on image 169/2. Musculoskeletal: Right lateral second and third rib fractures  are new from prior favored sequela post radiation change. No suspicious lesion identified. Anterior cervical fusion hardware. IMPRESSION: 1. Decreased size of the right upper lobe peripheral pulmonary nodule adjacent to the fiducial marker with similar adjacent post radiation change. 2. Stable 11 x 4 mm left perifissural pulmonary nodule. 3. Stable scattered pulmonary nodules measuring up to 5 mm in the right lower lobe. 4. Question of a left upper/interpolar renal mass versus nodular renal cortex measuring 2.9 cm. Recommend further evaluation with renal ultrasound. 5. Right lateral second and third rib fractures are new from prior favored sequela post radiation change. Aortic Atherosclerosis (ICD10-I70.0) and Emphysema (ICD10-J43.9). Electronically Signed   By: Reyes Holder M.D.   On: 10/23/2023 16:33    Impression/Plan: 73 yo gentleman with adenocarcinoma of the right upper lobe and left lingula; s/p SBRT completed on 06/06/2022    Most recent CT  scan fortunately demonstrates a positive response to treatment. We will continue with serial imaging every 6 months. Office follow-up 3-4 days after his scan to review the results.   Imaging noted new right lateral second and third rib fractures, which could be the cause of his right-sided back pain. We discussed typical course of healing. He is experiencing 10/10 pain on Tylenol  and is unable to take NSAIDs due to his Plavix  use. Unable to presribe Opioid due to risk of serotonin syndrome while on Mirtazapine . Patient is not taking Neurontin  and states that he is not interested in trying due to it not working in the past. Offered an anesthesiology referral for consideration of a nerve block, but patient was not interested in this option either. We can consider MRI of the neck to rule out cervical etiology if pain does not improve. He understands to let us  know if his pain worsens or does not improve.   CT of the chest also commented on question of a left upper/interpolar renal mass versus nodular renal cortex measuring 2.9 cm. Renal ultrasound ordered today for recommended further evaluation. I will call the patient with the results and plan moving forward.    This encounter was conducted via telephone.  The patient has provided two factor identification and has given verbal consent for this type of encounter and has been advised to only accept a meeting of this type in a secure network environment.  The time spent during this encounter was 20 minutes including preparation, discussion, and coordination of the patient's care  The attendants for this meeting include Leeroy Due PA-C, patient, and family members. During the encounter Leeroy Due PA-C was located at [facility] Radiation Oncology Department.  Patient, and family, were located at home. ____________________________________   Leeroy Due, PA-C   This document serves as a record of services personally performed by Leeroy Due,  PA-C. It was created on her behalf by Reymundo Cartwright, a trained medical scribe. The creation of this record is based on the scribe's personal observations and the provider's statements to them. This document has been checked and approved by the attending provider.

## 2023-10-28 ENCOUNTER — Other Ambulatory Visit: Payer: Self-pay | Admitting: Radiology

## 2023-10-28 ENCOUNTER — Ambulatory Visit: Admitting: Radiology

## 2023-10-28 DIAGNOSIS — C3491 Malignant neoplasm of unspecified part of right bronchus or lung: Secondary | ICD-10-CM

## 2023-10-28 DIAGNOSIS — C3492 Malignant neoplasm of unspecified part of left bronchus or lung: Secondary | ICD-10-CM

## 2023-11-03 ENCOUNTER — Ambulatory Visit (HOSPITAL_COMMUNITY)
Admission: RE | Admit: 2023-11-03 | Discharge: 2023-11-03 | Disposition: A | Discharge status: Home / Self Care | Source: Ambulatory Visit | Attending: Radiology | Admitting: Radiology

## 2023-11-03 DIAGNOSIS — N2889 Other specified disorders of kidney and ureter: Secondary | ICD-10-CM | POA: Insufficient documentation

## 2023-11-11 ENCOUNTER — Telehealth: Payer: Self-pay | Admitting: Radiology

## 2023-11-11 NOTE — Telephone Encounter (Signed)
 Called the patient to review results of his renal U/S. Imaging demonstrates no evidence of a mass. No further follow-up indicated at this time.     Leeroy Due, PA-C

## 2023-12-01 ENCOUNTER — Ambulatory Visit (HOSPITAL_COMMUNITY)
Admission: RE | Admit: 2023-12-01 | Discharge: 2023-12-01 | Disposition: A | Discharge status: Home / Self Care | Source: Ambulatory Visit | Attending: Cardiovascular Disease | Admitting: Cardiovascular Disease

## 2023-12-01 ENCOUNTER — Ambulatory Visit (HOSPITAL_BASED_OUTPATIENT_CLINIC_OR_DEPARTMENT_OTHER)
Admission: RE | Admit: 2023-12-01 | Discharge: 2023-12-01 | Disposition: A | Discharge status: Home / Self Care | Source: Ambulatory Visit | Attending: Cardiovascular Disease | Admitting: Cardiovascular Disease

## 2023-12-01 DIAGNOSIS — I739 Peripheral vascular disease, unspecified: Secondary | ICD-10-CM | POA: Diagnosis not present

## 2023-12-02 LAB — VAS US ABI WITH/WO TBI
Left ABI: 1.01
Right ABI: 1.16

## 2023-12-04 ENCOUNTER — Ambulatory Visit: Payer: Self-pay | Admitting: Cardiovascular Disease

## 2024-04-13 ENCOUNTER — Telehealth: Payer: Self-pay

## 2024-04-13 NOTE — Telephone Encounter (Signed)
 Patient called in to report having a recent CT scan and was told that he needed to have a PET scan. PET to be done at [facility] hospital. Patient requesting to cancel upcoming CT scan and follow up with Dr. Kyra Leeroy Due PA-C . Patient to call back after he visits with Dr. Kerrin.

## 2024-04-15 ENCOUNTER — Other Ambulatory Visit: Payer: Self-pay | Admitting: Radiation Oncology

## 2024-04-15 ENCOUNTER — Telehealth: Payer: Self-pay

## 2024-04-15 DIAGNOSIS — C3492 Malignant neoplasm of unspecified part of left bronchus or lung: Secondary | ICD-10-CM

## 2024-04-15 NOTE — Telephone Encounter (Signed)
 Received a call from patients daughter Luke. Per Luke  Dr. Geraldene doctor at [facility] is requesting Dr. Shannon order PET scan due to recent findings on chest CT. Daughter is requesting PET be done at a [facility] health facility.

## 2024-04-19 ENCOUNTER — Encounter (HOSPITAL_COMMUNITY): Payer: Self-pay | Admitting: Hematology and Oncology

## 2024-04-20 ENCOUNTER — Telehealth: Payer: Self-pay | Admitting: *Deleted

## 2024-04-20 NOTE — Telephone Encounter (Signed)
 CALLED PATIENT'S DAUGHTER- KIMBERLY Fouse, AND INFORMED OF PET SCAN FOR 04-30-24- ARRIVAL TIME- 4 PM @ [facility], PATIENT TO HAVE WATER ONLY - 6 HRS. PRIOR TO SCAN, SPOKE WITH PATIENT'S DAUGHTER- KIMBERLY Bohnet AND SHE IS AWARE OF THIS SCAN AND THE INSTRUCTIONS

## 2024-04-30 ENCOUNTER — Ambulatory Visit (HOSPITAL_COMMUNITY)

## 2024-05-03 ENCOUNTER — Ambulatory Visit: Admitting: Radiation Oncology

## 2024-05-04 ENCOUNTER — Ambulatory Visit: Payer: Self-pay | Admitting: Radiology

## 2024-05-04 ENCOUNTER — Encounter: Admitting: Thoracic Surgery (Cardiothoracic Vascular Surgery)

## 2024-05-11 ENCOUNTER — Ambulatory Visit: Admitting: Radiology

## 2024-05-11 ENCOUNTER — Encounter: Admitting: Thoracic Surgery (Cardiothoracic Vascular Surgery)

## 2024-05-18 ENCOUNTER — Ambulatory Visit: Admitting: Cardiovascular Disease
